# Patient Record
Sex: Female | Born: 1945
Health system: Southern US, Community
[De-identification: ages and names within clinical notes are randomized; demographics above are authoritative.]

## PROBLEM LIST (undated history)

## (undated) DIAGNOSIS — E1142 Type 2 diabetes mellitus with diabetic polyneuropathy: Secondary | ICD-10-CM

## (undated) DIAGNOSIS — R5382 Chronic fatigue, unspecified: Secondary | ICD-10-CM

## (undated) DIAGNOSIS — K769 Liver disease, unspecified: Secondary | ICD-10-CM

## (undated) DIAGNOSIS — E119 Type 2 diabetes mellitus without complications: Secondary | ICD-10-CM

## (undated) DIAGNOSIS — F0390 Unspecified dementia without behavioral disturbance: Secondary | ICD-10-CM

## (undated) DIAGNOSIS — F329 Major depressive disorder, single episode, unspecified: Secondary | ICD-10-CM

## (undated) DIAGNOSIS — F32A Depression, unspecified: Secondary | ICD-10-CM

## (undated) DIAGNOSIS — F419 Anxiety disorder, unspecified: Secondary | ICD-10-CM

## (undated) DIAGNOSIS — M1990 Unspecified osteoarthritis, unspecified site: Secondary | ICD-10-CM

## (undated) DIAGNOSIS — B019 Varicella without complication: Secondary | ICD-10-CM

## (undated) DIAGNOSIS — L0390 Cellulitis, unspecified: Secondary | ICD-10-CM

## (undated) DIAGNOSIS — E538 Deficiency of other specified B group vitamins: Secondary | ICD-10-CM

## (undated) DIAGNOSIS — R079 Chest pain, unspecified: Secondary | ICD-10-CM

## (undated) DIAGNOSIS — K219 Gastro-esophageal reflux disease without esophagitis: Secondary | ICD-10-CM

## (undated) DIAGNOSIS — I10 Essential (primary) hypertension: Secondary | ICD-10-CM

## (undated) DIAGNOSIS — K635 Polyp of colon: Secondary | ICD-10-CM

## (undated) DIAGNOSIS — E7800 Pure hypercholesterolemia, unspecified: Secondary | ICD-10-CM

## (undated) HISTORY — DX: Major depressive disorder, single episode, unspecified: F32.9

## (undated) HISTORY — DX: Varicella without complication: B01.9

## (undated) HISTORY — DX: Essential (primary) hypertension: I10

## (undated) HISTORY — PX: KNEE SURGERY: SHX244

## (undated) HISTORY — PX: HERNIA REPAIR: SHX51

## (undated) HISTORY — PX: ROTATOR CUFF REPAIR: SHX139

## (undated) HISTORY — DX: Anxiety disorder, unspecified: F41.9

## (undated) HISTORY — DX: Chest pain, unspecified: R07.9

## (undated) HISTORY — DX: Depression, unspecified: F32.A

## (undated) HISTORY — PX: TONSILLECTOMY: SUR1361

## (undated) HISTORY — DX: Deficiency of other specified B group vitamins: E53.8

## (undated) HISTORY — DX: Cellulitis, unspecified: L03.90

## (undated) HISTORY — PX: APPENDECTOMY: SHX54

---

## 2004-06-28 ENCOUNTER — Ambulatory Visit (HOSPITAL_COMMUNITY): Admission: RE | Admit: 2004-06-28 | Discharge: 2004-06-28 | Payer: Self-pay | Admitting: Orthopedic Surgery

## 2004-06-28 ENCOUNTER — Ambulatory Visit (HOSPITAL_BASED_OUTPATIENT_CLINIC_OR_DEPARTMENT_OTHER): Admission: RE | Admit: 2004-06-28 | Discharge: 2004-06-28 | Payer: Self-pay | Admitting: Orthopedic Surgery

## 2005-10-18 ENCOUNTER — Emergency Department (HOSPITAL_COMMUNITY): Admission: EM | Admit: 2005-10-18 | Discharge: 2005-10-18 | Payer: Self-pay | Admitting: *Deleted

## 2007-01-15 ENCOUNTER — Encounter: Admission: RE | Admit: 2007-01-15 | Discharge: 2007-01-15 | Payer: Self-pay | Admitting: Family Medicine

## 2009-09-28 ENCOUNTER — Emergency Department (HOSPITAL_BASED_OUTPATIENT_CLINIC_OR_DEPARTMENT_OTHER): Admission: EM | Admit: 2009-09-28 | Discharge: 2009-09-28 | Payer: Self-pay | Admitting: Emergency Medicine

## 2010-06-23 ENCOUNTER — Emergency Department (HOSPITAL_BASED_OUTPATIENT_CLINIC_OR_DEPARTMENT_OTHER)
Admission: EM | Admit: 2010-06-23 | Discharge: 2010-06-23 | Payer: Self-pay | Source: Home / Self Care | Admitting: Emergency Medicine

## 2010-07-01 LAB — URINALYSIS, ROUTINE W REFLEX MICROSCOPIC
Bilirubin Urine: NEGATIVE
Hgb urine dipstick: NEGATIVE
Ketones, ur: NEGATIVE mg/dL
Nitrite: NEGATIVE
Protein, ur: 100 mg/dL — AB
Specific Gravity, Urine: 1.014 (ref 1.005–1.030)
Urine Glucose, Fasting: NEGATIVE mg/dL
Urobilinogen, UA: 0.2 mg/dL (ref 0.0–1.0)
pH: 6 (ref 5.0–8.0)

## 2010-07-01 LAB — URINE MICROSCOPIC-ADD ON

## 2010-11-01 NOTE — Op Note (Signed)
Meredith Phillips, Meredith Phillips               ACCOUNT NO.:  [id number]   MEDICAL RECORD NO.:  [id number]          PATIENT TYPE:  AMB   LOCATION:  DSC                          FACILITY:  [facility]   PHYSICIAN:  Harvie Junior, M.D.   DATE OF BIRTH:  1946-03-03   DATE OF PROCEDURE:  06/28/2004  DATE OF DISCHARGE:                                 OPERATIVE REPORT   PREOPERATIVE DIAGNOSIS:  Status post rotator cuff repair with suspected re-  tear with chronic pain and stiffness.   POSTOPERATIVE DIAGNOSIS:  1.  Frozen shoulder.  2.  Subacromial impingement and significant scar tissue in the subacromial      space.   OPERATION PERFORMED:  1.  Closed manipulation of shoulder.  2.  Subacromial decompression.  3.  Significant debridement within the glenohumeral joint and subacromial      space.   SURGEON:  Harvie Junior, M.D.   ASSISTANT:  Marshia Ly, P.A.   ANESTHESIA:  General.   INDICATIONS FOR PROCEDURE:  Meredith Phillips is a 65 year old female with a long  history of having had an arthroscopic decompression, mini open rotator cuff  repair.  She ultimately was evaluated and felt to have significant pain.  She had done well, off and on and developed some stiffness and chronic pain  and because of this it was felt that repeat evaluation with MRI was  appropriate.  MRI showed she had a 2 to 3 mm hole in the rotator cuff but  that most of the dye was contained and it was felt that if nothing else, we  needed to do an evaluation and possible subacromial decompression to  evaluate the situation. At this point she was brought to the operating room  for this procedure.   DESCRIPTION OF PROCEDURE:  The patient was brought to the operating room and  after adequate anesthesia was obtained with general anesthetic, the patient  was placed supine on the operating table and the shoulder was manipulated  into the overhead position, external rotation and internal rotation  undertaken and there was some  tearing of capsular structures that was  encountered during this manipulation.  At this point routine arthroscopic  examination of the glenohumeral joint revealed there were some adhesions  between the biceps tendon and the rotator cuff which were taken down.  There  was some significant labral pathology which was debrided.  The glenohumeral  joint itself looked pretty good.  Rotator cuff looked quite good and there  was no evidence of full thickness tearing.  Although there was some  thinning, these sutures appeared to be within the substance of the rotator  cuff at this point.  At this point attention was turned out of the  glenohumeral joint into the subacromial space. A dramatic bursectomy was  undertaken as well as taking down the periosteum on the acromioplasty.  A  small acromioplasty was then added to the previously performed acromioplasty  and this was undertaken.  At this point the subtotal bursectomy was  undertaken.  The knots from the previous rotator cuff repair were identified  and debrided and  the arm was put through a range of motion and the distal  clavicle was elevated somewhat by way of a coplaning technique but after  this thorough debridement, the rotator cuff was evaluated and felt to have  no full thickness tearing.  I did not feel that open rotator cuff repair  needed to be undertaken.  At this point the shoulder was copiously irrigated  and suctioned dry.  Arthroscopic portal was closed with a bandage.  Sterile  compressive dressing was applied.  The patient was taken to the recovery  room where she was noted to be in satisfactory condition.  The estimated  blood loss for this procedure was none.      JLG/MEDQ  D:  06/28/2004  T:  06/28/2004  Job:  [id number]

## 2013-06-18 ENCOUNTER — Emergency Department (HOSPITAL_BASED_OUTPATIENT_CLINIC_OR_DEPARTMENT_OTHER): Payer: Medicare Other

## 2013-06-18 ENCOUNTER — Encounter (HOSPITAL_BASED_OUTPATIENT_CLINIC_OR_DEPARTMENT_OTHER): Payer: Self-pay | Admitting: Emergency Medicine

## 2013-06-18 ENCOUNTER — Emergency Department (HOSPITAL_BASED_OUTPATIENT_CLINIC_OR_DEPARTMENT_OTHER)
Admission: EM | Admit: 2013-06-18 | Discharge: 2013-06-18 | Disposition: A | Discharge status: Home / Self Care | Payer: Medicare Other | Attending: Emergency Medicine | Admitting: Emergency Medicine

## 2013-06-18 DIAGNOSIS — Z8719 Personal history of other diseases of the digestive system: Secondary | ICD-10-CM | POA: Insufficient documentation

## 2013-06-18 DIAGNOSIS — Z8739 Personal history of other diseases of the musculoskeletal system and connective tissue: Secondary | ICD-10-CM | POA: Insufficient documentation

## 2013-06-18 DIAGNOSIS — I10 Essential (primary) hypertension: Secondary | ICD-10-CM | POA: Insufficient documentation

## 2013-06-18 DIAGNOSIS — E1142 Type 2 diabetes mellitus with diabetic polyneuropathy: Secondary | ICD-10-CM | POA: Insufficient documentation

## 2013-06-18 DIAGNOSIS — Z862 Personal history of diseases of the blood and blood-forming organs and certain disorders involving the immune mechanism: Secondary | ICD-10-CM | POA: Insufficient documentation

## 2013-06-18 DIAGNOSIS — R1084 Generalized abdominal pain: Secondary | ICD-10-CM | POA: Insufficient documentation

## 2013-06-18 DIAGNOSIS — R197 Diarrhea, unspecified: Secondary | ICD-10-CM | POA: Insufficient documentation

## 2013-06-18 DIAGNOSIS — Z8639 Personal history of other endocrine, nutritional and metabolic disease: Secondary | ICD-10-CM | POA: Insufficient documentation

## 2013-06-18 DIAGNOSIS — E1149 Type 2 diabetes mellitus with other diabetic neurological complication: Secondary | ICD-10-CM | POA: Insufficient documentation

## 2013-06-18 DIAGNOSIS — R109 Unspecified abdominal pain: Secondary | ICD-10-CM

## 2013-06-18 DIAGNOSIS — R6883 Chills (without fever): Secondary | ICD-10-CM | POA: Insufficient documentation

## 2013-06-18 DIAGNOSIS — R112 Nausea with vomiting, unspecified: Secondary | ICD-10-CM | POA: Insufficient documentation

## 2013-06-18 DIAGNOSIS — R1110 Vomiting, unspecified: Secondary | ICD-10-CM

## 2013-06-18 DIAGNOSIS — Z8659 Personal history of other mental and behavioral disorders: Secondary | ICD-10-CM | POA: Insufficient documentation

## 2013-06-18 HISTORY — DX: Unspecified osteoarthritis, unspecified site: M19.90

## 2013-06-18 HISTORY — DX: Essential (primary) hypertension: I10

## 2013-06-18 HISTORY — DX: Type 2 diabetes mellitus without complications: E11.9

## 2013-06-18 HISTORY — DX: Pure hypercholesterolemia, unspecified: E78.00

## 2013-06-18 HISTORY — DX: Type 2 diabetes mellitus with diabetic polyneuropathy: E11.42

## 2013-06-18 HISTORY — DX: Gastro-esophageal reflux disease without esophagitis: K21.9

## 2013-06-18 LAB — OCCULT BLOOD X 1 CARD TO LAB, STOOL: Fecal Occult Bld: NEGATIVE

## 2013-06-18 LAB — COMPREHENSIVE METABOLIC PANEL
ALT: 16 U/L (ref 0–35)
AST: 19 U/L (ref 0–37)
Albumin: 3.7 g/dL (ref 3.5–5.2)
Alkaline Phosphatase: 55 U/L (ref 39–117)
BUN: 11 mg/dL (ref 6–23)
CO2: 25 mEq/L (ref 19–32)
Calcium: 10.3 mg/dL (ref 8.4–10.5)
Chloride: 96 mEq/L (ref 96–112)
Creatinine, Ser: 1.1 mg/dL (ref 0.50–1.10)
GFR calc Af Amer: 58 mL/min — ABNORMAL LOW (ref >90)
GFR calc non Af Amer: 50 mL/min — ABNORMAL LOW (ref >90)
Glucose, Bld: 130 mg/dL — ABNORMAL HIGH (ref 70–99)
Potassium: 3.4 mEq/L — ABNORMAL LOW (ref 3.7–5.3)
Sodium: 136 mEq/L — ABNORMAL LOW (ref 137–147)
Total Bilirubin: 0.4 mg/dL (ref 0.3–1.2)
Total Protein: 7.4 g/dL (ref 6.0–8.3)

## 2013-06-18 LAB — CBC WITH DIFFERENTIAL/PLATELET
Basophils Absolute: 0 10*3/uL (ref 0.0–0.1)
Basophils Relative: 0 % (ref 0–1)
Eosinophils Absolute: 0 10*3/uL (ref 0.0–0.7)
Eosinophils Relative: 1 % (ref 0–5)
HCT: 36.1 % (ref 36.0–46.0)
Hemoglobin: 12.1 g/dL (ref 12.0–15.0)
Lymphocytes Relative: 48 % — ABNORMAL HIGH (ref 12–46)
Lymphs Abs: 3.6 10*3/uL (ref 0.7–4.0)
MCH: 28.3 pg (ref 26.0–34.0)
MCHC: 33.5 g/dL (ref 30.0–36.0)
MCV: 84.5 fL (ref 78.0–100.0)
Monocytes Absolute: 0.7 10*3/uL (ref 0.1–1.0)
Monocytes Relative: 9 % (ref 3–12)
Neutro Abs: 3.2 10*3/uL (ref 1.7–7.7)
Neutrophils Relative %: 42 % — ABNORMAL LOW (ref 43–77)
Platelets: 356 10*3/uL (ref 150–400)
RBC: 4.27 MIL/uL (ref 3.87–5.11)
RDW: 12.3 % (ref 11.5–15.5)
WBC: 7.5 10*3/uL (ref 4.0–10.5)

## 2013-06-18 LAB — CG4 I-STAT (LACTIC ACID): Lactic Acid, Venous: 1.01 mmol/L (ref 0.5–2.2)

## 2013-06-18 LAB — URINALYSIS, ROUTINE W REFLEX MICROSCOPIC
Bilirubin Urine: NEGATIVE
Glucose, UA: NEGATIVE mg/dL
Hgb urine dipstick: NEGATIVE
Ketones, ur: NEGATIVE mg/dL
Nitrite: NEGATIVE
Protein, ur: 30 mg/dL — AB
Specific Gravity, Urine: 1.029 (ref 1.005–1.030)
Urobilinogen, UA: 0.2 mg/dL (ref 0.0–1.0)
pH: 6 (ref 5.0–8.0)

## 2013-06-18 LAB — URINE MICROSCOPIC-ADD ON

## 2013-06-18 LAB — TROPONIN I: Troponin I: <0.3 ng/mL (ref <0.30)

## 2013-06-18 LAB — LIPASE, BLOOD: Lipase: 18 U/L (ref 11–59)

## 2013-06-18 MED ORDER — ONDANSETRON 4 MG PO TBDP: ORAL_TABLET | ORAL | Status: DC

## 2013-06-18 MED ORDER — HYDROCODONE-ACETAMINOPHEN 5-325 MG PO TABS
1.0000 | ORAL_TABLET | Freq: Once | ORAL | Status: AC
  Administered 2013-06-18: 1 via ORAL
  Filled 2013-06-18: qty 1

## 2013-06-18 MED ORDER — FENTANYL CITRATE 0.05 MG/ML IJ SOLN
50.0000 ug | Freq: Once | INTRAMUSCULAR | Status: AC
  Administered 2013-06-18: 50 ug via INTRAVENOUS
  Filled 2013-06-18: qty 2

## 2013-06-18 MED ORDER — SODIUM CHLORIDE 0.9 % IV BOLUS (SEPSIS)
500.0000 mL | Freq: Once | INTRAVENOUS | Status: AC
  Administered 2013-06-18: 500 mL via INTRAVENOUS

## 2013-06-18 MED ORDER — IOHEXOL 300 MG/ML  SOLN
50.0000 mL | Freq: Once | INTRAMUSCULAR | Status: AC | PRN
Start: 2013-06-18 — End: 2013-06-18
  Administered 2013-06-18: 50 mL via ORAL

## 2013-06-18 MED ORDER — ONDANSETRON HCL 4 MG/2ML IJ SOLN
4.0000 mg | Freq: Once | INTRAMUSCULAR | Status: AC
  Administered 2013-06-18: 4 mg via INTRAVENOUS
  Filled 2013-06-18: qty 2

## 2013-06-18 MED ORDER — POTASSIUM CHLORIDE CRYS ER 20 MEQ PO TBCR
40.0000 meq | EXTENDED_RELEASE_TABLET | Freq: Once | ORAL | Status: AC
  Administered 2013-06-18: 40 meq via ORAL
  Filled 2013-06-18: qty 2

## 2013-06-18 MED ORDER — HYDROCODONE-ACETAMINOPHEN 5-325 MG PO TABS: 1.0000 | ORAL_TABLET | Freq: Four times a day (QID) | ORAL | Status: DC | PRN

## 2013-06-18 MED ORDER — IOHEXOL 300 MG/ML  SOLN
100.0000 mL | Freq: Once | INTRAMUSCULAR | Status: AC | PRN
  Administered 2013-06-18: 100 mL via INTRAVENOUS

## 2013-06-18 MED ORDER — ONDANSETRON 4 MG PO TBDP
4.0000 mg | ORAL_TABLET | Freq: Once | ORAL | Status: AC
  Administered 2013-06-18: 4 mg via ORAL
  Filled 2013-06-18: qty 1

## 2013-06-18 NOTE — ED Notes (Signed)
Pt unable to provide urine sample, Pt will try later

## 2013-06-18 NOTE — Discharge Instructions (Signed)
Take zofran for nausea. For severe pain take norco or vicodin however realize they have the potential for addiction and it can make you sleepy and has tylenol in it.  No operating machinery while taking. If you were given medicines take as directed.  If you are on coumadin or contraceptives realize their levels and effectiveness is altered by many different medicines.  If you have any reaction (rash, tongues swelling, other) to the medicines stop taking and see a physician.   Please follow up as directed and return to the ER or see a physician for new or worsening symptoms.  Thank you. Blood sugar today was 130which is slightly high. Blood potassium was 3.4, slightly low. Call the wellness Center to schedule appointment next week

## 2013-06-18 NOTE — ED Provider Notes (Signed)
6:55 PM patient resting comfortably. States she's been having similar pain for several years. Not affected by being or lying supine. States has mild pain at periumbilical area. On exam abdomen normal bowel sounds tender immediately superior to the umbilicus. No mass.  is in no distress. Norco Zofran ordered prior to discharge. Patient is discharged in stable condition. Diagnosis #1 Results for orders placed during the hospital encounter of 06/18/13  URINALYSIS, ROUTINE W REFLEX MICROSCOPIC      Result Value Range   Color, Urine YELLOW  YELLOW   APPearance CLEAR  CLEAR   Specific Gravity, Urine 1.029  1.005 - 1.030   pH 6.0  5.0 - 8.0   Glucose, UA NEGATIVE  NEGATIVE mg/dL   Hgb urine dipstick NEGATIVE  NEGATIVE   Bilirubin Urine NEGATIVE  NEGATIVE   Ketones, ur NEGATIVE  NEGATIVE mg/dL   Protein, ur 30 (*) NEGATIVE mg/dL   Urobilinogen, UA 0.2  0.0 - 1.0 mg/dL   Nitrite NEGATIVE  NEGATIVE   Leukocytes, UA SMALL (*) NEGATIVE  CBC WITH DIFFERENTIAL      Result Value Range   WBC 7.5  4.0 - 10.5 K/uL   RBC 4.27  3.87 - 5.11 MIL/uL   Hemoglobin 12.1  12.0 - 15.0 g/dL   HCT 36.6  44.0 - 34.7 %   MCV 84.5  78.0 - 100.0 fL   MCH 28.3  26.0 - 34.0 pg   MCHC 33.5  30.0 - 36.0 g/dL   RDW 42.5  95.6 - 38.7 %   Platelets 356  150 - 400 K/uL   Neutrophils Relative % 42 (*) 43 - 77 %   Neutro Abs 3.2  1.7 - 7.7 K/uL   Lymphocytes Relative 48 (*) 12 - 46 %   Lymphs Abs 3.6  0.7 - 4.0 K/uL   Monocytes Relative 9  3 - 12 %   Monocytes Absolute 0.7  0.1 - 1.0 K/uL   Eosinophils Relative 1  0 - 5 %   Eosinophils Absolute 0.0  0.0 - 0.7 K/uL   Basophils Relative 0  0 - 1 %   Basophils Absolute 0.0  0.0 - 0.1 K/uL  COMPREHENSIVE METABOLIC PANEL      Result Value Range   Sodium 136 (*) 137 - 147 mEq/L   Potassium 3.4 (*) 3.7 - 5.3 mEq/L   Chloride 96  96 - 112 mEq/L   CO2 25  19 - 32 mEq/L   Glucose, Bld 130 (*) 70 - 99 mg/dL   BUN 11  6 - 23 mg/dL   Creatinine, Ser 5.64  0.50 - 1.10 mg/dL   Calcium 33.2  8.4 - 95.1 mg/dL   Total Protein 7.4  6.0 - 8.3 g/dL   Albumin 3.7  3.5 - 5.2 g/dL   AST 19  0 - 37 U/L   ALT 16  0 - 35 U/L   Alkaline Phosphatase 55  39 - 117 U/L   Total Bilirubin 0.4  0.3 - 1.2 mg/dL   GFR calc non Af Amer 50 (*) >90 mL/min   GFR calc Af Amer 58 (*) >90 mL/min  LIPASE, BLOOD      Result Value Range   Lipase 18  11 - 59 U/L  OCCULT BLOOD X 1 CARD TO LAB, STOOL      Result Value Range   Fecal Occult Bld NEGATIVE  NEGATIVE  TROPONIN I      Result Value Range   Troponin I <0.30  <0.30 ng/mL  URINE MICROSCOPIC-ADD ON      Result Value Range   Squamous Epithelial / LPF FEW (*) RARE   WBC, UA 0-2  <3 WBC/hpf   Bacteria, UA FEW (*) RARE   Casts HYALINE CASTS (*) NEGATIVE  CG4 I-STAT (LACTIC ACID)      Result Value Range   Lactic Acid, Venous 1.01  0.5 - 2.2 mmol/L   Ct Abdomen Pelvis W Contrast  06/18/2013   CLINICAL DATA:  Nausea, vomiting, chills and weakness. Periumbilical pain for 1 year. History of hernia repair, appendectomy and hysterectomy.  EXAM: CT ABDOMEN AND PELVIS WITH CONTRAST  TECHNIQUE: Multidetector CT imaging of the abdomen and pelvis was performed using the standard protocol following bolus administration of intravenous contrast.  CONTRAST:  23mL OMNIPAQUE IOHEXOL 300 MG/ML SOLN, OMNIPAQUE IOHEXOL 300 MG/ML SOLN  COMPARISON:  Abdominal pelvic CT 12/22/2001.  FINDINGS: The lung bases are clear. There is no pleural or pericardial effusion. There is a recurrent moderate size hiatal hernia associated with mild wall thickening.  No significant abnormalities of the liver, spleen, gallbladder or pancreas are demonstrated. There is no adrenal mass. Small low density renal lesions bilaterally are likely cysts. There is no hydronephrosis.  No focal abnormalities of the stomach, small bowel or colon are demonstrated. There is stool throughout the colon. There is no surrounding inflammatory change. There is diffuse aortoiliac atherosclerosis.  Multiple pelvic phleboliths are present bilaterally. The bladder appears unremarkable. There is no adnexal mass status post hysterectomy.  There is lower lumbar spondylosis with disc bulging and facet disease contributing to potential spinal stenosis at several levels. No acute osseous findings are evident.  IMPRESSION: 1. No acute abdominal pelvic findings. 2. Moderate-size recurrent hiatal hernia. 3. Diffuse atherosclerosis with multiple pelvic phleboliths. 4. Multilevel lumbar spondylosis.   Electronically Signed   By: Roxy Horseman M.D.   On: 06/18/2013 17:08   Dx #1 chronic abdominal pain #2 hypokalemia #3hyperglycemia  Doug Sou, MD 06/18/13 1859

## 2013-06-18 NOTE — ED Notes (Signed)
Patient was unable to sign discharge d/t equipment malfunction at the time of discharge.  No needs were voiced/no questions verbalized at the time of departure.  Discharged home, husband driving.

## 2013-06-18 NOTE — ED Provider Notes (Signed)
CSN: [id number]     Arrival date & time 06/18/13  1233 History   First MD Initiated Contact with Patient 06/18/13 1433     Chief Complaint  Patient presents with  . Abdominal Pain   (Consider location/radiation/quality/duration/timing/severity/associated sxs/prior Treatment) HPI Comments: 68 yo female with generalized intermittent cramping abd pain worsening for one year, especially the past week.  Worse with eating, pt had scopes done years ago, nothing recent. Ulcer hx but she has not taken nsaids since.  Non radiating pain.  No bleeding, mild dark stools.    Patient is a 68 y.o. female presenting with abdominal pain. The history is provided by the patient.  Abdominal Pain Pain location:  Generalized Associated symptoms: chills, diarrhea, nausea and vomiting   Associated symptoms: no chest pain, no dysuria, no fever and no shortness of breath     Past Medical History  Diagnosis Date  . Diabetes mellitus without complication   . GERD (gastroesophageal reflux disease)   . Hypercholesteremia   . Hypertension   . Diabetic peripheral neuropathy   . Arthritis    Past Surgical History  Procedure Laterality Date  . Hernia repair    . Rotator cuff repair    . Knee surgery     No family history on file. History  Substance Use Topics  . Smoking status: Never Smoker   . Smokeless tobacco: Not on file  . Alcohol Use: No   OB History   Grav Para Term Preterm Abortions TAB SAB Ect Mult Living                 Review of Systems  Constitutional: Positive for chills and appetite change. Negative for fever.  HENT: Negative for congestion.   Eyes: Negative for visual disturbance.  Respiratory: Negative for shortness of breath.   Cardiovascular: Negative for chest pain.  Gastrointestinal: Positive for nausea, vomiting, abdominal pain and diarrhea.  Genitourinary: Negative for dysuria and flank pain.  Musculoskeletal: Negative for back pain, neck pain and neck stiffness.  Skin:  Negative for rash.  Neurological: Negative for light-headedness and headaches.    Allergies  Other and Shellfish allergy  Home Medications  No current outpatient prescriptions on file. Pulse 56  Temp(Src) 98.2 F (36.8 C) (Oral)  Resp 16  Ht 5\' 3"  (1.6 m)  Wt 160 lb (72.576 kg)  BMI 28.35 kg/m2  SpO2 100% Physical Exam  Nursing note and vitals reviewed. Constitutional: She is oriented to person, place, and time. She appears well-developed and well-nourished.  HENT:  Head: Normocephalic and atraumatic.  Mild dry mm  Eyes: Conjunctivae are normal. Right eye exhibits no discharge. Left eye exhibits no discharge.  Neck: Normal range of motion. Neck supple. No tracheal deviation present.  Cardiovascular: Normal rate and regular rhythm.   Pulmonary/Chest: Effort normal and breath sounds normal.  Abdominal: Soft. She exhibits no distension. There is tenderness (mild central and diffuse). There is no guarding.  Musculoskeletal: She exhibits no edema.  Neurological: She is alert and oriented to person, place, and time.  Skin: Skin is warm. No rash noted.  Psychiatric: She has a normal mood and affect.    ED Course  Procedures (including critical care time) Labs Review Labs Reviewed  CBC WITH DIFFERENTIAL - Abnormal; Notable for the following:    Neutrophils Relative % 42 (*)    Lymphocytes Relative 48 (*)    All other components within normal limits  COMPREHENSIVE METABOLIC PANEL - Abnormal; Notable for the following:  Sodium 136 (*)    Potassium 3.4 (*)    Glucose, Bld 130 (*)    GFR calc non Af Amer 50 (*)    GFR calc Af Amer 58 (*)    All other components within normal limits  LIPASE, BLOOD  OCCULT BLOOD X 1 CARD TO LAB, STOOL  TROPONIN I  URINALYSIS, ROUTINE W REFLEX MICROSCOPIC  LACTIC ACID, PLASMA  CG4 I-STAT (LACTIC ACID)   Imaging Review Ct Abdomen Pelvis W Contrast  06/18/2013   CLINICAL DATA:  Nausea, vomiting, chills and weakness. Periumbilical pain for 1  year. History of hernia repair, appendectomy and hysterectomy.  EXAM: CT ABDOMEN AND PELVIS WITH CONTRAST  TECHNIQUE: Multidetector CT imaging of the abdomen and pelvis was performed using the standard protocol following bolus administration of intravenous contrast.  CONTRAST:  48mL OMNIPAQUE IOHEXOL 300 MG/ML SOLN, OMNIPAQUE IOHEXOL 300 MG/ML SOLN  COMPARISON:  Abdominal pelvic CT 12/22/2001.  FINDINGS: The lung bases are clear. There is no pleural or pericardial effusion. There is a recurrent moderate size hiatal hernia associated with mild wall thickening.  No significant abnormalities of the liver, spleen, gallbladder or pancreas are demonstrated. There is no adrenal mass. Small low density renal lesions bilaterally are likely cysts. There is no hydronephrosis.  No focal abnormalities of the stomach, small bowel or colon are demonstrated. There is stool throughout the colon. There is no surrounding inflammatory change. There is diffuse aortoiliac atherosclerosis. Multiple pelvic phleboliths are present bilaterally. The bladder appears unremarkable. There is no adnexal mass status post hysterectomy.  There is lower lumbar spondylosis with disc bulging and facet disease contributing to potential spinal stenosis at several levels. No acute osseous findings are evident.  IMPRESSION: 1. No acute abdominal pelvic findings. 2. Moderate-size recurrent hiatal hernia. 3. Diffuse atherosclerosis with multiple pelvic phleboliths. 4. Multilevel lumbar spondylosis.   Electronically Signed   By: Roxy Horseman M.D.   On: 06/18/2013 17:08    EKG Interpretation   None       MDM   1. Abdominal pain   2. Vomiting    Intermittent abdo pain for 1 yrs.  Well appearing in ED.  Pain improved. Vomited once after po contrast but feels improved. UA pending. CT no acute findings to explain pain except hernia, lactate nl.  Close fup outpt discussed, pt may need GI/ scopes.   Signed out pending UA, I have already  discussed fup with pt.   Results and differential diagnosis were discussed with the patient. Close follow up outpatient was discussed, patient comfortable with the plan.   Diagnosis: abd pain, nausea    Enid Skeens, MD 06/18/13 [phone]

## 2013-06-18 NOTE — ED Notes (Signed)
Pt having abdominal pain for over one year.  Pt saw MD on 12-27 for same and was put on two antibiotics.  Pt states the medication made her feel worse.  Pt having some N/V.  Some chills.  Pt states she feels weak all over.  Pt states she has been having dark stools for a long time as well.

## 2013-06-22 ENCOUNTER — Ambulatory Visit: Payer: Self-pay | Admitting: Family

## 2013-07-13 ENCOUNTER — Ambulatory Visit: Payer: Medicare Other | Admitting: Gastroenterology

## 2013-09-13 ENCOUNTER — Ambulatory Visit: Payer: Self-pay | Admitting: Family Medicine

## 2013-09-27 ENCOUNTER — Ambulatory Visit: Payer: Medicare Other | Admitting: Family Medicine

## 2013-09-27 DIAGNOSIS — Z0289 Encounter for other administrative examinations: Secondary | ICD-10-CM

## 2013-10-03 ENCOUNTER — Ambulatory Visit: Payer: Medicare Other | Admitting: Family Medicine

## 2013-11-29 DIAGNOSIS — R197 Diarrhea, unspecified: Secondary | ICD-10-CM | POA: Insufficient documentation

## 2013-12-10 ENCOUNTER — Emergency Department (HOSPITAL_BASED_OUTPATIENT_CLINIC_OR_DEPARTMENT_OTHER)
Admission: EM | Admit: 2013-12-10 | Discharge: 2013-12-10 | Disposition: A | Discharge status: Home / Self Care | Payer: Medicare HMO | Attending: Emergency Medicine | Admitting: Emergency Medicine

## 2013-12-10 ENCOUNTER — Encounter (HOSPITAL_BASED_OUTPATIENT_CLINIC_OR_DEPARTMENT_OTHER): Payer: Self-pay | Admitting: Emergency Medicine

## 2013-12-10 DIAGNOSIS — M5380 Other specified dorsopathies, site unspecified: Secondary | ICD-10-CM | POA: Insufficient documentation

## 2013-12-10 DIAGNOSIS — E1149 Type 2 diabetes mellitus with other diabetic neurological complication: Secondary | ICD-10-CM | POA: Insufficient documentation

## 2013-12-10 DIAGNOSIS — Z79899 Other long term (current) drug therapy: Secondary | ICD-10-CM | POA: Insufficient documentation

## 2013-12-10 DIAGNOSIS — Z792 Long term (current) use of antibiotics: Secondary | ICD-10-CM | POA: Insufficient documentation

## 2013-12-10 DIAGNOSIS — E1142 Type 2 diabetes mellitus with diabetic polyneuropathy: Secondary | ICD-10-CM | POA: Insufficient documentation

## 2013-12-10 DIAGNOSIS — Z8739 Personal history of other diseases of the musculoskeletal system and connective tissue: Secondary | ICD-10-CM | POA: Insufficient documentation

## 2013-12-10 DIAGNOSIS — Z860100 Personal history of colon polyps, unspecified: Secondary | ICD-10-CM | POA: Insufficient documentation

## 2013-12-10 DIAGNOSIS — Z862 Personal history of diseases of the blood and blood-forming organs and certain disorders involving the immune mechanism: Secondary | ICD-10-CM | POA: Insufficient documentation

## 2013-12-10 DIAGNOSIS — N390 Urinary tract infection, site not specified: Secondary | ICD-10-CM | POA: Insufficient documentation

## 2013-12-10 DIAGNOSIS — M62830 Muscle spasm of back: Secondary | ICD-10-CM

## 2013-12-10 DIAGNOSIS — I10 Essential (primary) hypertension: Secondary | ICD-10-CM | POA: Insufficient documentation

## 2013-12-10 DIAGNOSIS — Z8639 Personal history of other endocrine, nutritional and metabolic disease: Secondary | ICD-10-CM | POA: Insufficient documentation

## 2013-12-10 HISTORY — DX: Liver disease, unspecified: K76.9

## 2013-12-10 HISTORY — DX: Polyp of colon: K63.5

## 2013-12-10 LAB — URINALYSIS, ROUTINE W REFLEX MICROSCOPIC
Bilirubin Urine: NEGATIVE
Glucose, UA: NEGATIVE mg/dL
Hgb urine dipstick: NEGATIVE
Ketones, ur: NEGATIVE mg/dL
Nitrite: NEGATIVE
Protein, ur: 100 mg/dL — AB
Specific Gravity, Urine: 1.009 (ref 1.005–1.030)
Urobilinogen, UA: 0.2 mg/dL (ref 0.0–1.0)
pH: 7 (ref 5.0–8.0)

## 2013-12-10 LAB — URINE MICROSCOPIC-ADD ON

## 2013-12-10 MED ORDER — CEPHALEXIN 500 MG PO CAPS: 500.0000 mg | ORAL_CAPSULE | Freq: Four times a day (QID) | ORAL | Status: DC

## 2013-12-10 MED ORDER — DIAZEPAM 2 MG PO TABS
2.0000 mg | ORAL_TABLET | Freq: Once | ORAL | Status: AC
  Administered 2013-12-10: 2 mg via ORAL
  Filled 2013-12-10: qty 1

## 2013-12-10 MED ORDER — DIAZEPAM 2 MG PO TABS: 2.0000 mg | ORAL_TABLET | Freq: Three times a day (TID) | ORAL | Status: DC | PRN

## 2013-12-10 NOTE — ED Notes (Signed)
Pt does not know medications. Pt husband will call with updated medications.

## 2013-12-10 NOTE — Discharge Instructions (Signed)

## 2013-12-10 NOTE — ED Notes (Signed)
Pt states right flank pain. Pt states started Thursday and throughout the past 2 days has radiated to her left back and left side. Pt states no problems with urine or bowel movements.

## 2013-12-26 NOTE — ED Provider Notes (Signed)
CSN: [id number]     Arrival date & time 12/10/13  1423 History   First MD Initiated Contact with Patient 12/10/13 1431     Chief Complaint  Patient presents with  . Flank Pain     (Consider location/radiation/quality/duration/timing/severity/associated sxs/prior Treatment) Patient is a 68 y.o. female presenting with flank pain. The history is provided by the patient. No language interpreter was used.  Flank Pain This is a new problem. The current episode started in the past 7 days. Associated symptoms include abdominal pain. Pertinent negatives include no chest pain, coughing, fever, nausea, rash, vomiting or weakness. Associated symptoms comments: Left flank pain that radiates to left lateral abdominal wall. No fever, vomiting, dysuria, hematuria or change in bowel habits. She denies history of kidney stones. No cough or shortness of breath. The pain is worse with movement. No known injury..    Past Medical History  Diagnosis Date  . Diabetes mellitus without complication   . GERD (gastroesophageal reflux disease)   . Hypercholesteremia   . Hypertension   . Diabetic peripheral neuropathy   . Arthritis   . Liver disease   . Polyp of colon    Past Surgical History  Procedure Laterality Date  . Hernia repair    . Rotator cuff repair    . Knee surgery     History reviewed. No pertinent family history. History  Substance Use Topics  . Smoking status: Never Smoker   . Smokeless tobacco: Not on file  . Alcohol Use: No   OB History   Grav Para Term Preterm Abortions TAB SAB Ect Mult Living                 Review of Systems  Constitutional: Negative for fever and appetite change.  Respiratory: Negative for cough and shortness of breath.   Cardiovascular: Negative for chest pain.  Gastrointestinal: Positive for abdominal pain. Negative for nausea, vomiting and diarrhea.  Genitourinary: Positive for flank pain. Negative for dysuria and hematuria.  Musculoskeletal: Negative for  back pain.  Skin: Negative for rash.  Neurological: Negative for weakness.      Allergies  Other and Shellfish allergy  Home Medications   Prior to Admission medications   Medication Sig Start Date End Date Taking? Authorizing Provider  metFORMIN (GLUCOPHAGE) 500 MG tablet Take 500 mg by mouth 2 (two) times daily with a meal.   Yes Historical Provider, MD  cephALEXin (KEFLEX) 500 MG capsule Take 1 capsule (500 mg total) by mouth 4 (four) times daily. 12/10/13   Pamelia Botto A Remington Skalsky, PA-C  diazepam (VALIUM) 2 MG tablet Take 1 tablet (2 mg total) by mouth every 8 (eight) hours as needed for anxiety. 12/10/13   Kinzie Wickes A Lateefah Mallery, PA-C  HYDROcodone-acetaminophen (NORCO) 5-325 MG per tablet Take 1-2 tablets by mouth every 6 (six) hours as needed for severe pain. 06/18/13   Enid Skeens, MD  ondansetron (ZOFRAN ODT) 4 MG disintegrating tablet 4mg  ODT q4 hours prn nausea/vomit 06/18/13   08/16/13, MD   BP 113/64  Pulse 74  Temp(Src) 98.1 F (36.7 C) (Oral)  Resp 16  Ht 5\' 3"  (1.6 m)  Wt 169 lb (76.658 kg)  BMI 29.94 kg/m2  SpO2 99% Physical Exam  Constitutional: She is oriented to person, place, and time. She appears well-developed and well-nourished.  HENT:  Head: Normocephalic.  Neck: Normal range of motion. Neck supple.  Cardiovascular: Normal rate and regular rhythm.   Pulmonary/Chest: Effort normal and breath sounds normal.  Abdominal:  Soft. Bowel sounds are normal. There is tenderness. There is no rebound and no guarding.  Lateral abdominal tenderness. No mass. BS normoactive.  Genitourinary:  No CVA tenderness.   Musculoskeletal: Normal range of motion.  Neurological: She is alert and oriented to person, place, and time.  Skin: Skin is warm and dry. No rash noted.  Psychiatric: She has a normal mood and affect.    ED Course  Procedures (including critical care time) Labs Review Labs Reviewed  URINALYSIS, ROUTINE W REFLEX MICROSCOPIC - Abnormal; Notable for the  following:    Protein, ur 100 (*)    Leukocytes, UA MODERATE (*)    All other components within normal limits  URINE MICROSCOPIC-ADD ON - Abnormal; Notable for the following:    Squamous Epithelial / LPF FEW (*)    Bacteria, UA FEW (*)    All other components within normal limits    Imaging Review No results found.   EKG Interpretation None      MDM   Final diagnoses:  Muscle spasm of back  UTI (lower urinary tract infection)    Urine is borderline positive. Culture pending. Patient evaluated by Dr. Fredderick Phenix. Felt pain is related to muscular pattern. VSS. Return precautions given.     Arnoldo Hooker, PA-C 12/26/13 [phone]

## 2013-12-26 NOTE — ED Provider Notes (Signed)
Medical screening examination/treatment/procedure(s) were performed by non-physician practitioner and as supervising physician I was immediately available for consultation/collaboration.   EKG Interpretation None        Luverta Korte, MD 12/26/13 2021 

## 2014-11-25 DIAGNOSIS — K2100 Gastro-esophageal reflux disease with esophagitis, without bleeding: Secondary | ICD-10-CM | POA: Insufficient documentation

## 2014-11-25 DIAGNOSIS — M169 Osteoarthritis of hip, unspecified: Secondary | ICD-10-CM | POA: Insufficient documentation

## 2014-11-25 DIAGNOSIS — M159 Polyosteoarthritis, unspecified: Secondary | ICD-10-CM | POA: Insufficient documentation

## 2014-12-26 ENCOUNTER — Encounter: Payer: Self-pay | Admitting: Family

## 2014-12-26 ENCOUNTER — Ambulatory Visit (INDEPENDENT_AMBULATORY_CARE_PROVIDER_SITE_OTHER): Payer: Medicare HMO | Admitting: Family

## 2014-12-26 VITALS — BP 130/94 | HR 66 | Temp 97.5°F | Resp 18 | Ht 63.0 in | Wt 164.0 lb

## 2014-12-26 DIAGNOSIS — Z23 Encounter for immunization: Secondary | ICD-10-CM

## 2014-12-26 DIAGNOSIS — Z1231 Encounter for screening mammogram for malignant neoplasm of breast: Secondary | ICD-10-CM

## 2014-12-26 DIAGNOSIS — Z0000 Encounter for general adult medical examination without abnormal findings: Secondary | ICD-10-CM | POA: Diagnosis not present

## 2014-12-26 NOTE — Progress Notes (Signed)
Subjective:    Patient ID: Meredith Phillips, female    DOB: 06-01-1946, 69 y.o.   MRN: [id number]  Chief Complaint  Patient presents with  . Establish Care    would like to set up a bone density test    HPI:  Meredith Phillips is a 69 y.o. female who presents today for an annual wellness visit.   1) Health Maintenance -   Diet - Averages about 2-3 meals per day consisting of occasional fruits and vegetables and does not eat a lot or very well.   Exercise - No structured exercise  2) Preventative Exams / Immunizations:  Dental -- Due for exam  Vision -- Up to date   Health Maintenance  Topic Date Due  . MAMMOGRAM  06/24/1993  . ZOSTAVAX  06/25/2003  . DEXA SCAN  06/24/2008  . PNA vac Low Risk Adult (1 of 2 - PCV13) 06/24/2008  . INFLUENZA VACCINE  01/15/2015  . TETANUS/TDAP  11/14/2019  . COLONOSCOPY  10/15/2022   Zostavax today; check on PNA vaccine    There is no immunization history on file for this patient.   RISK FACTORS  Tobacco History  Smoking status  . Never Smoker   Smokeless tobacco  . Never Used     Cardiac risk factors: advanced age (older than 4 for men, 59 for women), diabetes mellitus, dyslipidemia, hypertension and sedentary lifestyle.  Depression Screen  Q1: Over the past two weeks, have you felt down, depressed or hopeless? No  Q2: Over the past two weeks, have you felt little interest or pleasure in doing things? No  Have you lost interest or pleasure in daily life? No  Do you often feel hopeless? No  Do you cry easily over simple problems? No  Activities of Daily Living In your present state of health, do you have any difficulty performing the following activities?:  Driving? No Managing money?  No Feeding yourself? No Getting from bed to chair? No Climbing a flight of stairs? No Preparing food and eating?: No Bathing or showering? No Getting dressed: No Getting to the toilet? No Using the toilet: No Moving around from  place to place: No In the past year have you fallen or had a near fall?:No   Home Safety Has smoke detector and wears seat belts. No firearms. No excess sun exposure. Are there smokers in your home (other than you)?  No Do you feel safe at home?  Yes  Hearing Difficulties: No Do you often ask people to speak up or repeat themselves? No Do you experience ringing or noises in your ears? No  Do you have difficulty understanding soft or whispered voices? No    Cognitive Testing  Alert? Yes   Normal Appearance? Yes  Oriented to person? Yes  Place? Yes   Time? Yes  Recall of three objects?  Yes  Can perform simple calculations? Yes  Displays appropriate judgment? Yes  Can read the correct time from a watch face? Yes  Do you feel that you have a problem with memory? No  Do you often misplace items? No   Advanced Directives have been discussed with the patient? Does not have.   Current Physicians/Providers and Suppliers  1. Marcos Eke, FNP - Primary Care  Indicate any recent Medical Services you may have received from other than [facility] providers in the past year (date may be approximate).  All answers were reviewed with the patient and necessary referrals were made:  Jeanine Luz,  FNP   12/26/2014    Review of Systems  Constitutional: Denies fever, chills, fatigue, or significant weight gain/loss. HENT: Head: Denies headache or neck pain Ears: Denies changes in hearing, ringing in ears, earache, drainage Nose: Denies discharge, stuffiness, itching, nosebleed, sinus pain Throat: Denies sore throat, hoarseness, dry mouth, sores, thrush Eyes: Denies loss/changes in vision, pain, redness, blurry/double vision, flashing lights Cardiovascular: Denies chest pain/discomfort, tightness, palpitations, shortness of breath with activity, difficulty lying down, swelling, sudden awakening with shortness of breath Respiratory: Denies shortness of breath, cough, sputum production,  wheezing Gastrointestinal: Denies dysphasia, heartburn, change in appetite, nausea, change in bowel habits, rectal bleeding, constipation, diarrhea, yellow skin or eyes Genitourinary: Denies frequency, urgency, burning/pain, blood in urine, incontinence, change in urinary strength. Musculoskeletal: Denies muscle/joint pain, stiffness, back pain, redness or swelling of joints, trauma Skin: Denies rashes, lumps, itching, dryness, color changes, or hair/nail changes Neurological: Denies dizziness, fainting, seizures, weakness, numbness, tingling, tremor Psychiatric - Denies nervousness, stress, depression or memory loss Endocrine: Denies heat or cold intolerance, sweating, frequent urination, excessive thirst, changes in appetite Hematologic: Denies ease of bruising or bleeding    Objective:     BP 130/94 mmHg  Pulse 66  Temp(Src) 97.5 F (36.4 C) (Oral)  Resp 18  Ht 5\' 3"  (1.6 m)  Wt 164 lb (74.39 kg)  BMI 29.06 kg/m2  SpO2 99% Nursing note and vital signs reviewed.  Physical Exam  Constitutional: She is oriented to person, place, and time. She appears well-developed and well-nourished.  Ambulates with a cane. Appears her stated age and is dressed appropriately for the situation.   HENT:  Head: Normocephalic.  Right Ear: Hearing, tympanic membrane, external ear and ear canal normal.  Left Ear: Hearing, tympanic membrane, external ear and ear canal normal.  Nose: Nose normal.  Mouth/Throat: Uvula is midline, oropharynx is clear and moist and mucous membranes are normal.  Eyes: Conjunctivae and EOM are normal. Pupils are equal, round, and reactive to light.  Neck: Neck supple. No JVD present. No tracheal deviation present. No thyromegaly present.  Cardiovascular: Normal rate, regular rhythm, normal heart sounds and intact distal pulses.   Pulmonary/Chest: Effort normal and breath sounds normal.  Abdominal: Soft. Bowel sounds are normal. She exhibits no distension and no mass. There  is no tenderness. There is no rebound and no guarding.  Musculoskeletal: Normal range of motion. She exhibits no edema or tenderness.  Lymphadenopathy:    She has no cervical adenopathy.  Neurological: She is alert and oriented to person, place, and time. She has normal reflexes. No cranial nerve deficit. She exhibits normal muscle tone. Coordination normal.  Skin: Skin is warm and dry.  Psychiatric: She has a normal mood and affect. Her behavior is normal. Judgment and thought content normal.       Assessment & Plan:   During the course of the visit the patient was educated and counseled about appropriate screening and preventive services including:    Pneumococcal vaccine   Influenza vaccine  Td vaccine  Diabetes screening  Diet review for nutrition referral? Yes ____  Not Indicated _X___   Patient Instructions (the written plan) was given to the patient.  Medicare Attestation I have personally reviewed: The patient's medical and social history Their use of alcohol, tobacco or illicit drugs Their current medications and supplements The patient's functional ability including ADLs,fall risks, home safety risks, cognitive, and hearing and visual impairment Diet and physical activities Evidence for depression or mood disorders  The patient's weight,  height, BMI,  have been recorded in the chart.  I have made referrals, counseling, and provided education to the patient based on review of the above and I have provided the patient with a written personalized care plan for preventive services.     Jeanine Luz, FNP   12/26/2014

## 2014-12-26 NOTE — Assessment & Plan Note (Signed)
1) Anticipatory Guidance: Discussed importance of wearing a seatbelt while driving and not texting while driving; changing batteries in smoke detector at least once annually; wearing suntan lotion when outside; eating a balanced and moderate diet; getting physical activity at least 30 minutes per day.  2) Immunizations / Screenings / Labs:  Zostavax updated today. Will check on PNA vaccinations she has received. All other immunizations are up to date per recommendations. Due for dental screen which will be scheduled independently. Also due for mammogram and DEXA - referrals placed for both. Other screenings are up to date per recommendations. Obtain CBC, BMET, A1c, urine microalbumin, Lipid profile and TSH when fasting.   Overall well exam. Several risk factors for cardiovascular disease include diabetes, hypertension, hyperlipidemia, and sedentary lifestyle. Chronic diseases are currently managed with medication and will be monitored for effectiveness of treatment regimen. Discussed importance of physical activity and increasing physical activity to 30 minutes most days of the week which will help reduce her risk for cardiovascular disease and bone mineral density. Follow up office visit pending lab work; follow up prevention exam in 1 year.

## 2014-12-26 NOTE — Assessment & Plan Note (Signed)
Reviewed and updated patient's medical, surgical, family and social history. Medications and allergies were also reviewed. Basic screenings for depression, activities of daily living, hearing, cognition and safety were performed. Provider list was updated and health plan was provided to the patient.  

## 2014-12-26 NOTE — Progress Notes (Deleted)
   Subjective:    Patient ID: Meredith Phillips, female    DOB: [birth year], 69 y.o.   MRN: [id number]  Chief Complaint  Patient presents with  . Establish Care    would like to set up a bone density test    HPI:  Meredith Phillips is a 69 y.o. female with a PMH of *** who presents today ***   Review of Systems    Objective:    BP 130/94 mmHg  Pulse 66  Temp(Src) 97.5 F (36.4 C) (Oral)  Resp 18  Ht 5\' 3"  (1.6 m)  Wt 164 lb (74.39 kg)  BMI 29.06 kg/m2  SpO2 99% Nursing note and vital signs reviewed.  Physical Exam     Assessment & Plan:   Problem List Items Addressed This Visit    None

## 2014-12-26 NOTE — Progress Notes (Signed)
Pre visit review using our clinic review tool, if applicable. No additional management support is needed unless otherwise documented below in the visit note. 

## 2014-12-26 NOTE — Patient Instructions (Addendum)
Thank you for choosing [facility].  Summary/Instructions:  They will contact for mammogram and bone density.  Please stop by the lab on the basement level of the building for your blood work. Your results will be released to [place] (or called to you) after review, usually within 72 hours after test completion. If any changes need to be made, you will be notified at that same time.  Referrals have been made during this visit. You should expect to hear back from our schedulers in about 7-10 days in regards to establishing an appointment with the specialists we discussed.   If your symptoms worsen or fail to improve, please contact our office for further instruction, or in case of emergency go directly to the emergency room at the closest medical facility.   Health Maintenance  Topic Date Due  . MAMMOGRAM  06/24/1993  . ZOSTAVAX  06/25/2003  . DEXA SCAN  06/24/2008  . PNA vac Low Risk Adult (1 of 2 - PCV13) 06/24/2008  . INFLUENZA VACCINE  01/15/2015  . TETANUS/TDAP  11/14/2019  . COLONOSCOPY  10/15/2022    Health Maintenance Adopting a healthy lifestyle and getting preventive care can go a long way to promote health and wellness. Talk with your health care provider about what schedule of regular examinations is right for you. This is a good chance for you to check in with your provider about disease prevention and staying healthy. In between checkups, there are plenty of things you can do on your own. Experts have done a lot of research about which lifestyle changes and preventive measures are most likely to keep you healthy. Ask your health care provider for more information. WEIGHT AND DIET  Eat a healthy diet  Be sure to include plenty of vegetables, fruits, low-fat dairy products, and lean protein.  Do not eat a lot of foods high in solid fats, added sugars, or salt.  Get regular exercise. This is one of the most important things you can do for your health.  Most adults  should exercise for at least 150 minutes each week. The exercise should increase your heart rate and make you sweat (moderate-intensity exercise).  Most adults should also do strengthening exercises at least twice a week. This is in addition to the moderate-intensity exercise.  Maintain a healthy weight  Body mass index (BMI) is a measurement that can be used to identify possible weight problems. It estimates body fat based on height and weight. Your health care provider can help determine your BMI and help you achieve or maintain a healthy weight.  For females 52 years of age and older:   A BMI below 18.5 is considered underweight.  A BMI of 18.5 to 24.9 is normal.  A BMI of 25 to 29.9 is considered overweight.  A BMI of 30 and above is considered obese.  Watch levels of cholesterol and blood lipids  You should start having your blood tested for lipids and cholesterol at 71 years of age, then have this test every 5 years.  You may need to have your cholesterol levels checked more often if:  Your lipid or cholesterol levels are high.  You are older than 71 years of age.  You are at high risk for heart disease.  CANCER SCREENING   Lung Cancer  Lung cancer screening is recommended for adults 5-68 years old who are at high risk for lung cancer because of a history of smoking.  A yearly low-dose CT scan of the lungs  is recommended for people who:  Currently smoke.  Have quit within the past 15 years.  Have at least a 30-pack-year history of smoking. A pack year is smoking an average of one pack of cigarettes a day for 1 year.  Yearly screening should continue until it has been 15 years since you quit.  Yearly screening should stop if you develop a health problem that would prevent you from having lung cancer treatment.  Breast Cancer  Practice breast self-awareness. This means understanding how your breasts normally appear and feel.  It also means doing regular  breast self-exams. Let your health care provider know about any changes, no matter how small.  If you are in your 20s or 30s, you should have a clinical breast exam (CBE) by a health care provider every 1-3 years as part of a regular health exam.  If you are 34 or older, have a CBE every year. Also consider having a breast X-ray (mammogram) every year.  If you have a family history of breast cancer, talk to your health care provider about genetic screening.  If you are at high risk for breast cancer, talk to your health care provider about having an MRI and a mammogram every year.  Breast cancer gene (BRCA) assessment is recommended for women who have family members with BRCA-related cancers. BRCA-related cancers include:  Breast.  Ovarian.  Tubal.  Peritoneal cancers.  Results of the assessment will determine the need for genetic counseling and BRCA1 and BRCA2 testing. Cervical Cancer Routine pelvic examinations to screen for cervical cancer are no longer recommended for nonpregnant women who are considered low risk for cancer of the pelvic organs (ovaries, uterus, and vagina) and who do not have symptoms. A pelvic examination may be necessary if you have symptoms including those associated with pelvic infections. Ask your health care provider if a screening pelvic exam is right for you.   The Pap test is the screening test for cervical cancer for women who are considered at risk.  If you had a hysterectomy for a problem that was not cancer or a condition that could lead to cancer, then you no longer need Pap tests.  If you are older than 65 years, and you have had normal Pap tests for the past 10 years, you no longer need to have Pap tests.  If you have had past treatment for cervical cancer or a condition that could lead to cancer, you need Pap tests and screening for cancer for at least 20 years after your treatment.  If you no longer get a Pap test, assess your risk factors if  they change (such as having a new sexual partner). This can affect whether you should start being screened again.  Some women have medical problems that increase their chance of getting cervical cancer. If this is the case for you, your health care provider may recommend more frequent screening and Pap tests.  The human papillomavirus (HPV) test is another test that may be used for cervical cancer screening. The HPV test looks for the virus that can cause cell changes in the cervix. The cells collected during the Pap test can be tested for HPV.  The HPV test can be used to screen women 70 years of age and older. Getting tested for HPV can extend the interval between normal Pap tests from three to five years.  An HPV test also should be used to screen women of any age who have unclear Pap test results.  After 71 years of age, women should have HPV testing as often as Pap tests.  Colorectal Cancer  This type of cancer can be detected and often prevented.  Routine colorectal cancer screening usually begins at 71 years of age and continues through 71 years of age.  Your health care provider may recommend screening at an earlier age if you have risk factors for colon cancer.  Your health care provider may also recommend using home test kits to check for hidden blood in the stool.  A small camera at the end of a tube can be used to examine your colon directly (sigmoidoscopy or colonoscopy). This is done to check for the earliest forms of colorectal cancer.  Routine screening usually begins at age 70.  Direct examination of the colon should be repeated every 5-10 years through 71 years of age. However, you may need to be screened more often if early forms of precancerous polyps or small growths are found. Skin Cancer  Check your skin from head to toe regularly.  Tell your health care provider about any new moles or changes in moles, especially if there is a change in a mole's shape or  color.  Also tell your health care provider if you have a mole that is larger than the size of a pencil eraser.  Always use sunscreen. Apply sunscreen liberally and repeatedly throughout the day.  Protect yourself by wearing long sleeves, pants, a wide-brimmed hat, and sunglasses whenever you are outside. HEART DISEASE, DIABETES, AND HIGH BLOOD PRESSURE   Have your blood pressure checked at least every 1-2 years. High blood pressure causes heart disease and increases the risk of stroke.  If you are between 47 years and 40 years old, ask your health care provider if you should take aspirin to prevent strokes.  Have regular diabetes screenings. This involves taking a blood sample to check your fasting blood sugar level.  If you are at a normal weight and have a low risk for diabetes, have this test once every three years after 71 years of age.  If you are overweight and have a high risk for diabetes, consider being tested at a younger age or more often. PREVENTING INFECTION  Hepatitis B  If you have a higher risk for hepatitis B, you should be screened for this virus. You are considered at high risk for hepatitis B if:  You were born in a country where hepatitis B is common. Ask your health care provider which countries are considered high risk.  Your parents were born in a high-risk country, and you have not been immunized against hepatitis B (hepatitis B vaccine).  You have HIV or AIDS.  You use needles to inject street drugs.  You live with someone who has hepatitis B.  You have had sex with someone who has hepatitis B.  You get hemodialysis treatment.  You take certain medicines for conditions, including cancer, organ transplantation, and autoimmune conditions. Hepatitis C  Blood testing is recommended for:  Everyone born from [birth year] through 1967.  Anyone with known risk factors for hepatitis C. Sexually transmitted infections (STIs)  You should be screened for sexually  transmitted infections (STIs) including gonorrhea and chlamydia if:  You are sexually active and are younger than 71 years of age.  You are older than 71 years of age and your health care provider tells you that you are at risk for this type of infection.  Your sexual activity has changed since you were last screened  and you are at an increased risk for chlamydia or gonorrhea. Ask your health care provider if you are at risk.  If you do not have HIV, but are at risk, it may be recommended that you take a prescription medicine daily to prevent HIV infection. This is called pre-exposure prophylaxis (PrEP). You are considered at risk if:  You are sexually active and do not regularly use condoms or know the HIV status of your partner(s).  You take drugs by injection.  You are sexually active with a partner who has HIV. Talk with your health care provider about whether you are at high risk of being infected with HIV. If you choose to begin PrEP, you should first be tested for HIV. You should then be tested every 3 months for as long as you are taking PrEP.  PREGNANCY   If you are premenopausal and you may become pregnant, ask your health care provider about preconception counseling.  If you may become pregnant, take 400 to 800 micrograms (mcg) of folic acid every day.  If you want to prevent pregnancy, talk to your health care provider about birth control (contraception). OSTEOPOROSIS AND MENOPAUSE   Osteoporosis is a disease in which the bones lose minerals and strength with aging. This can result in serious bone fractures. Your risk for osteoporosis can be identified using a bone density scan.  If you are 100 years of age or older, or if you are at risk for osteoporosis and fractures, ask your health care provider if you should be screened.  Ask your health care provider whether you should take a calcium or vitamin D supplement to lower your risk for osteoporosis.  Menopause may have  certain physical symptoms and risks.  Hormone replacement therapy may reduce some of these symptoms and risks. Talk to your health care provider about whether hormone replacement therapy is right for you.  HOME CARE INSTRUCTIONS   Schedule regular health, dental, and eye exams.  Stay current with your immunizations.   Do not use any tobacco products including cigarettes, chewing tobacco, or electronic cigarettes.  If you are pregnant, do not drink alcohol.  If you are breastfeeding, limit how much and how often you drink alcohol.  Limit alcohol intake to no more than 1 drink per day for nonpregnant women. One drink equals 12 ounces of beer, 5 ounces of wine, or 1 ounces of hard liquor.  Do not use street drugs.  Do not share needles.  Ask your health care provider for help if you need support or information about quitting drugs.  Tell your health care provider if you often feel depressed.  Tell your health care provider if you have ever been abused or do not feel safe at home. Document Released: 12/16/2010 Document Revised: 10/17/2013 Document Reviewed: 05/04/2013 [facility] Patient Information 2015 [place], [place]. This information is not intended to replace advice given to you by your health care provider. Make sure you discuss any questions you have with your health care provider.

## 2014-12-27 ENCOUNTER — Telehealth: Payer: Self-pay | Admitting: Family

## 2014-12-27 NOTE — Telephone Encounter (Signed)
[facility] - Client TELEPHONE ADVICE RECORD [facility] Patient Name: Meredith Phillips DOB: 06-07-1946 Initial Comment Caller States she had the shingles shot yesterday. this morning she is sweating and dizzy. Nurse Assessment Nurse: Ladona Ridgel, RN, [place] Date/Time (Eastern Time): 12/27/2014 3:52:55 PM Confirm and document reason for call. If symptomatic, describe symptoms. ---PT has sweating off and on and moderate dizzy after having shingles vaccine yesterday. Denies a fever. She may have hot flashes but she doesnt have that as often as in the past Has the patient traveled out of the country within the last 30 days? ---No Does the patient require triage? ---Yes Related visit to physician within the last 2 weeks? ---Yes Does the PT have any chronic conditions? (i.e. diabetes, asthma, etc.) ---Yes List chronic conditions. ---DMGuidelines Guideline Title Affirmed Question Affirmed Notes Immunization Reactions Shingles (Herpes zoster; Zostavax) vaccine reactions (all triage questions negative) Dizziness - Lightheadedness Heart beating < 50 beats per minute OR > 140 beats per minute Final Disposition User Call EMS 911 Now [place], RN, [place] Comments she counted HR 36 then 32 - but she was talking and walking - moderately dizzy Disagree/Comply: Comply Disagree/Comply: Comply Call Id: [id number]

## 2015-01-01 ENCOUNTER — Other Ambulatory Visit (INDEPENDENT_AMBULATORY_CARE_PROVIDER_SITE_OTHER): Payer: Medicare HMO

## 2015-01-01 ENCOUNTER — Telehealth: Payer: Self-pay | Admitting: Family

## 2015-01-01 DIAGNOSIS — Z0000 Encounter for general adult medical examination without abnormal findings: Secondary | ICD-10-CM

## 2015-01-01 DIAGNOSIS — I10 Essential (primary) hypertension: Secondary | ICD-10-CM | POA: Diagnosis not present

## 2015-01-01 LAB — CBC
HCT: 36.4 % (ref 36.0–46.0)
Hemoglobin: 12.1 g/dL (ref 12.0–15.0)
MCHC: 33.3 g/dL (ref 30.0–36.0)
MCV: 88 fl (ref 78.0–100.0)
Platelets: 396 10*3/uL (ref 150.0–400.0)
RBC: 4.13 Mil/uL (ref 3.87–5.11)
RDW: 13.6 % (ref 11.5–15.5)
WBC: 5.4 10*3/uL (ref 4.0–10.5)

## 2015-01-01 LAB — MICROALBUMIN / CREATININE URINE RATIO
CREATININE, U: 126.9 mg/dL
Microalb Creat Ratio: 42.2 mg/g — ABNORMAL HIGH (ref 0.0–30.0)
Microalb, Ur: 53.5 mg/dL — ABNORMAL HIGH (ref 0.0–1.9)

## 2015-01-01 LAB — BASIC METABOLIC PANEL
BUN: 6 mg/dL (ref 6–23)
CO2: 28 meq/L (ref 19–32)
Calcium: 10 mg/dL (ref 8.4–10.5)
Chloride: 106 mEq/L (ref 96–112)
Creatinine, Ser: 0.66 mg/dL (ref 0.40–1.20)
GFR: 113.37 mL/min (ref >60.00)
Glucose, Bld: 110 mg/dL — ABNORMAL HIGH (ref 70–99)
POTASSIUM: 3.5 meq/L (ref 3.5–5.1)
SODIUM: 143 meq/L (ref 135–145)

## 2015-01-01 LAB — LIPID PANEL
Cholesterol: 119 mg/dL (ref 0–200)
HDL: 52.8 mg/dL (ref >39.00)
LDL Cholesterol: 55 mg/dL (ref 0–99)
NonHDL: 66.2
Total CHOL/HDL Ratio: 2
Triglycerides: 58 mg/dL (ref 0.0–149.0)
VLDL: 11.6 mg/dL (ref 0.0–40.0)

## 2015-01-01 LAB — TSH: TSH: 1.09 u[IU]/mL (ref 0.35–4.50)

## 2015-01-01 LAB — HEMOGLOBIN A1C: Hgb A1c MFr Bld: 5.6 % (ref 4.6–6.5)

## 2015-01-01 NOTE — Telephone Encounter (Signed)
Please inform patient that her blood work from her physical indicate that her white/red blood cells, cholesterol, thyroid, kidney function, electrolytes, and hemoglobin A1c are all normal. Her urine test did show some protein in her urine indicating kidney damage. The way to protect her kidneys is to work to control her blood pressure and blood sugars.

## 2015-01-03 NOTE — Telephone Encounter (Signed)
LVM for pt to call back.

## 2015-01-08 NOTE — Telephone Encounter (Signed)
Pt aware of results 

## 2015-01-17 ENCOUNTER — Encounter: Payer: Self-pay | Admitting: Family

## 2015-01-17 ENCOUNTER — Other Ambulatory Visit: Payer: Self-pay | Admitting: Family

## 2015-01-17 ENCOUNTER — Ambulatory Visit (INDEPENDENT_AMBULATORY_CARE_PROVIDER_SITE_OTHER): Payer: Medicare HMO | Admitting: Family

## 2015-01-17 VITALS — BP 118/78 | HR 83 | Temp 97.5°F | Wt 163.0 lb

## 2015-01-17 DIAGNOSIS — M5431 Sciatica, right side: Secondary | ICD-10-CM | POA: Diagnosis not present

## 2015-01-17 DIAGNOSIS — M5430 Sciatica, unspecified side: Secondary | ICD-10-CM | POA: Insufficient documentation

## 2015-01-17 MED ORDER — PREDNISONE 10 MG PO TABS: ORAL_TABLET | ORAL | Status: DC

## 2015-01-17 NOTE — Patient Instructions (Addendum)
Thank you for choosing [facility].  Summary/Instructions:   6 Day Prednisone Taper Instructions:   Day 1: Two tablets before breakfast, one after lunch, one after dinner, and two at bedtime.  Day 2: One tablet before breakfast, one after lunch, one after dinner, and two at bedtime Day 3: One tablet before breakfast, one after lunch, one after dinner, and one at bedtime Day 4: One tablet before breakfast, one after lunch, and one at bedtime Day 5: One tablet before breakfast and one at bedtime Day 6: One tablet before breakfast  Your prescription(s) have been submitted to your pharmacy or been printed and provided for you. Please take as directed and contact our office if you believe you are having problem(s) with the medication(s) or have any questions.  If your symptoms worsen or fail to improve, please contact our office for further instruction, or in case of emergency go directly to the emergency room at the closest medical facility.   Sciatica Sciatica is pain, weakness, numbness, or tingling along the path of the sciatic nerve. The nerve starts in the lower back and runs down the back of each leg. The nerve controls the muscles in the lower leg and in the back of the knee, while also providing sensation to the back of the thigh, lower leg, and the sole of your foot. Sciatica is a symptom of another medical condition. For instance, nerve damage or certain conditions, such as a herniated disk or bone spur on the spine, pinch or put pressure on the sciatic nerve. This causes the pain, weakness, or other sensations normally associated with sciatica. Generally, sciatica only affects one side of the body. CAUSES   Herniated or slipped disc.  Degenerative disk disease.  A pain disorder involving the narrow muscle in the buttocks (piriformis syndrome).  Pelvic injury or fracture.  Pregnancy.  Tumor (rare). SYMPTOMS  Symptoms can vary from mild to very severe. The symptoms  usually travel from the low back to the buttocks and down the back of the leg. Symptoms can include:  Mild tingling or dull aches in the lower back, leg, or hip.  Numbness in the back of the calf or sole of the foot.  Burning sensations in the lower back, leg, or hip.  Sharp pains in the lower back, leg, or hip.  Leg weakness.  Severe back pain inhibiting movement. These symptoms may get worse with coughing, sneezing, laughing, or prolonged sitting or standing. Also, being overweight may worsen symptoms. DIAGNOSIS  Your caregiver will perform a physical exam to look for common symptoms of sciatica. He or she may ask you to do certain movements or activities that would trigger sciatic nerve pain. Other tests may be performed to find the cause of the sciatica. These may include:  Blood tests.  X-rays.  Imaging tests, such as an MRI or CT scan. TREATMENT  Treatment is directed at the cause of the sciatic pain. Sometimes, treatment is not necessary and the pain and discomfort goes away on its own. If treatment is needed, your caregiver may suggest:  Over-the-counter medicines to relieve pain.  Prescription medicines, such as anti-inflammatory medicine, muscle relaxants, or narcotics.  Applying heat or ice to the painful area.  Steroid injections to lessen pain, irritation, and inflammation around the nerve.  Reducing activity during periods of pain.  Exercising and stretching to strengthen your abdomen and improve flexibility of your spine. Your caregiver may suggest losing weight if the extra weight makes the back pain worse.  Physical therapy.  Surgery to eliminate what is pressing or pinching the nerve, such as a bone spur or part of a herniated disk. HOME CARE INSTRUCTIONS   Only take over-the-counter or prescription medicines for pain or discomfort as directed by your caregiver.  Apply ice to the affected area for 20 minutes, 3-4 times a day for the first 48-72 hours.  Then try heat in the same way.  Exercise, stretch, or perform your usual activities if these do not aggravate your pain.  Attend physical therapy sessions as directed by your caregiver.  Keep all follow-up appointments as directed by your caregiver.  Do not wear high heels or shoes that do not provide proper support.  Check your mattress to see if it is too soft. A firm mattress may lessen your pain and discomfort. SEEK IMMEDIATE MEDICAL CARE IF:   You lose control of your bowel or bladder (incontinence).  You have increasing weakness in the lower back, pelvis, buttocks, or legs.  You have redness or swelling of your back.  You have a burning sensation when you urinate.  You have pain that gets worse when you lie down or awakens you at night.  Your pain is worse than you have experienced in the past.  Your pain is lasting longer than 4 weeks.  You are suddenly losing weight without reason. MAKE SURE YOU:  Understand these instructions.  Will watch your condition.  Will get help right away if you are not doing well or get worse. Document Released: 05/27/2001 Document Revised: 12/02/2011 Document Reviewed: 10/12/2011 [facility] Patient Information 2015 [place], [place]. This information is not intended to replace advice given to you by your health care provider. Make sure you discuss any questions you have with your health care provider.  Low Back Sprain with Rehab  A sprain is an injury in which a ligament is torn. The ligaments of the lower back are vulnerable to sprains. However, they are strong and require great force to be injured. These ligaments are important for stabilizing the spinal column. Sprains are classified into three categories. Grade 1 sprains cause pain, but the tendon is not lengthened. Grade 2 sprains include a lengthened ligament, due to the ligament being stretched or partially ruptured. With grade 2 sprains there is still function, although the function may  be decreased. Grade 3 sprains involve a complete tear of the tendon or muscle, and function is usually impaired. SYMPTOMS   Severe pain in the lower back.  Sometimes, a feeling of a "pop," "snap," or tear, at the time of injury.  Tenderness and sometimes swelling at the injury site.  Uncommonly, bruising (contusion) within 48 hours of injury.  Muscle spasms in the back. CAUSES  Low back sprains occur when a force is placed on the ligaments that is greater than they can handle. Common causes of injury include:  Performing a stressful act while off-balance.  Repetitive stressful activities that involve movement of the lower back.  Direct hit (trauma) to the lower back. RISK INCREASES WITH:  Contact sports (football, wrestling).  Collisions (major skiing accidents).  Sports that require throwing or lifting (baseball, weightlifting).  Sports involving twisting of the spine (gymnastics, diving, tennis, golf).  Poor strength and flexibility.  Inadequate protection.  Previous back injury or surgery (especially fusion). PREVENTION  Wear properly fitted and padded protective equipment.  Warm up and stretch properly before activity.  Allow for adequate recovery between workouts.  Maintain physical fitness:  Strength, flexibility, and endurance.  Cardiovascular  fitness.  Maintain a healthy body weight. PROGNOSIS  If treated properly, low back sprains usually heal with non-surgical treatment. The length of time for healing depends on the severity of the injury.  RELATED COMPLICATIONS   Recurring symptoms, resulting in a chronic problem.  Chronic inflammation and pain in the low back.  Delayed healing or resolution of symptoms, especially if activity is resumed too soon.  Prolonged impairment.  Unstable or arthritic joints of the low back. TREATMENT  Treatment first involves the use of ice and medicine, to reduce pain and inflammation. The use of strengthening and  stretching exercises may help reduce pain with activity. These exercises may be performed at home or with a therapist. Severe injuries may require referral to a therapist for further evaluation and treatment, such as ultrasound. Your caregiver may advise that you wear a back brace or corset, to help reduce pain and discomfort. Often, prolonged bed rest results in greater harm then benefit. Corticosteroid injections may be recommended. However, these should be reserved for the most serious cases. It is important to avoid using your back when lifting objects. At night, sleep on your back on a firm mattress, with a pillow placed under your knees. If non-surgical treatment is unsuccessful, surgery may be needed.  MEDICATION   If pain medicine is needed, nonsteroidal anti-inflammatory medicines (aspirin and ibuprofen), or other minor pain relievers (acetaminophen), are often advised.  Do not take pain medicine for 7 days before surgery.  Prescription pain relievers may be given, if your caregiver thinks they are needed. Use only as directed and only as much as you need.  Ointments applied to the skin may be helpful.  Corticosteroid injections may be given by your caregiver. These injections should be reserved for the most serious cases, because they may only be given a certain number of times.  HEAT AND COLD  Cold treatment (icing) should be applied for 10 to 15 minutes every 2 to 3 hours for inflammation and pain, and immediately after activity that aggravates your symptoms. Use ice packs or an ice massage.  Heat treatment may be used before performing stretching and strengthening activities prescribed by your caregiver, physical therapist, or athletic trainer. Use a heat pack or a warm water soak. SEEK MEDICAL CARE IF:   Symptoms get worse or do not improve in 2 to 4 weeks, despite treatment.  You develop numbness or weakness in either leg.  You lose bowel or bladder function.  Any of the  following occur after surgery: fever, increased pain, swelling, redness, drainage of fluids, or bleeding in the affected area.  New, unexplained symptoms develop. (Drugs used in treatment may produce side effects.) EXERCISES  RANGE OF MOTION (ROM) AND STRETCHING EXERCISES - Low Back Sprain Most people with lower back pain will find that their symptoms get worse with excessive bending forward (flexion) or arching at the lower back (extension). The exercises that will help resolve your symptoms will focus on the opposite motion.  Your physician, physical therapist or athletic trainer will help you determine which exercises will be most helpful to resolve your lower back pain. Do not complete any exercises without first consulting with your caregiver. Discontinue any exercises which make your symptoms worse, until you speak to your caregiver. If you have pain, numbness or tingling which travels down into your buttocks, leg or foot, the goal of the therapy is for these symptoms to move closer to your back and eventually resolve. Sometimes, these leg symptoms will get better,  but your lower back pain may worsen. This is often an indication of progress in your rehabilitation. Be very alert to any changes in your symptoms and the activities in which you participated in the 24 hours prior to the change. Sharing this information with your caregiver will allow him or her to most efficiently treat your condition. These exercises may help you when beginning to rehabilitate your injury. Your symptoms may resolve with or without further involvement from your physician, physical therapist or athletic trainer. While completing these exercises, remember:   Restoring tissue flexibility helps normal motion to return to the joints. This allows healthier, less painful movement and activity.  An effective stretch should be held for at least 30 seconds.  A stretch should never be painful. You should only feel a gentle  lengthening or release in the stretched tissue. FLEXION RANGE OF MOTION AND STRETCHING EXERCISES: STRETCH - Flexion, Single Knee to Chest   Lie on a firm bed or floor with both legs extended in front of you.  Keeping one leg in contact with the floor, bring your opposite knee to your chest. Hold your leg in place by either grabbing behind your thigh or at your knee.  Pull until you feel a gentle stretch in your low back. Hold __________ seconds.  Slowly release your grasp and repeat the exercise with the opposite side. Repeat __________ times. Complete this exercise __________ times per day.  STRETCH - Flexion, Double Knee to Chest  Lie on a firm bed or floor with both legs extended in front of you.  Keeping one leg in contact with the floor, bring your opposite knee to your chest.  Tense your stomach muscles to support your back and then lift your other knee to your chest. Hold your legs in place by either grabbing behind your thighs or at your knees.  Pull both knees toward your chest until you feel a gentle stretch in your low back. Hold __________ seconds.  Tense your stomach muscles and slowly return one leg at a time to the floor. Repeat __________ times. Complete this exercise __________ times per day.  STRETCH - Low Trunk Rotation  Lie on a firm bed or floor. Keeping your legs in front of you, bend your knees so they are both pointed toward the ceiling and your feet are flat on the floor.  Extend your arms out to the side. This will stabilize your upper body by keeping your shoulders in contact with the floor.  Gently and slowly drop both knees together to one side until you feel a gentle stretch in your low back. Hold for __________ seconds.  Tense your stomach muscles to support your lower back as you bring your knees back to the starting position. Repeat the exercise to the other side. Repeat __________ times. Complete this exercise __________ times per day  EXTENSION  RANGE OF MOTION AND FLEXIBILITY EXERCISES: STRETCH - Extension, Prone on Elbows   Lie on your stomach on the floor, a bed will be too soft. Place your palms about shoulder width apart and at the height of your head.  Place your elbows under your shoulders. If this is too painful, stack pillows under your chest.  Allow your body to relax so that your hips drop lower and make contact more completely with the floor.  Hold this position for __________ seconds.  Slowly return to lying flat on the floor. Repeat __________ times. Complete this exercise __________ times per day.  RANGE OF MOTION -  Extension, Prone Press Ups  Lie on your stomach on the floor, a bed will be too soft. Place your palms about shoulder width apart and at the height of your head.  Keeping your back as relaxed as possible, slowly straighten your elbows while keeping your hips on the floor. You may adjust the placement of your hands to maximize your comfort. As you gain motion, your hands will come more underneath your shoulders.  Hold this position __________ seconds.  Slowly return to lying flat on the floor. Repeat __________ times. Complete this exercise __________ times per day.  RANGE OF MOTION- Quadruped, Neutral Spine   Assume a hands and knees position on a firm surface. Keep your hands under your shoulders and your knees under your hips. You may place padding under your knees for comfort.  Drop your head and point your tailbone toward the ground below you. This will round out your lower back like an angry cat. Hold this position for __________ seconds.  Slowly lift your head and release your tail bone so that your back sags into a large arch, like an old horse.  Hold this position for __________ seconds.  Repeat this until you feel limber in your low back.  Now, find your "sweet spot." This will be the most comfortable position somewhere between the two previous positions. This is your neutral spine. Once  you have found this position, tense your stomach muscles to support your low back.  Hold this position for __________ seconds. Repeat __________ times. Complete this exercise __________ times per day.  STRENGTHENING EXERCISES - Low Back Sprain These exercises may help you when beginning to rehabilitate your injury. These exercises should be done near your "sweet spot." This is the neutral, low-back arch, somewhere between fully rounded and fully arched, that is your least painful position. When performed in this safe range of motion, these exercises can be used for people who have either a flexion or extension based injury. These exercises may resolve your symptoms with or without further involvement from your physician, physical therapist or athletic trainer. While completing these exercises, remember:   Muscles can gain both the endurance and the strength needed for everyday activities through controlled exercises.  Complete these exercises as instructed by your physician, physical therapist or athletic trainer. Increase the resistance and repetitions only as guided.  You may experience muscle soreness or fatigue, but the pain or discomfort you are trying to eliminate should never worsen during these exercises. If this pain does worsen, stop and make certain you are following the directions exactly. If the pain is still present after adjustments, discontinue the exercise until you can discuss the trouble with your caregiver. STRENGTHENING - Deep Abdominals, Pelvic Tilt   Lie on a firm bed or floor. Keeping your legs in front of you, bend your knees so they are both pointed toward the ceiling and your feet are flat on the floor.  Tense your lower abdominal muscles to press your low back into the floor. This motion will rotate your pelvis so that your tail bone is scooping upwards rather than pointing at your feet or into the floor. With a gentle tension and even breathing, hold this position for  __________ seconds. Repeat __________ times. Complete this exercise __________ times per day.  STRENGTHENING - Abdominals, Crunches   Lie on a firm bed or floor. Keeping your legs in front of you, bend your knees so they are both pointed toward the ceiling and your feet are  flat on the floor. Cross your arms over your chest.  Slightly tip your chin down without bending your neck.  Tense your abdominals and slowly lift your trunk high enough to just clear your shoulder blades. Lifting higher can put excessive stress on the lower back and does not further strengthen your abdominal muscles.  Control your return to the starting position. Repeat __________ times. Complete this exercise __________ times per day.  STRENGTHENING - Quadruped, Opposite UE/LE Lift   Assume a hands and knees position on a firm surface. Keep your hands under your shoulders and your knees under your hips. You may place padding under your knees for comfort.  Find your neutral spine and gently tense your abdominal muscles so that you can maintain this position. Your shoulders and hips should form a rectangle that is parallel with the floor and is not twisted.  Keeping your trunk steady, lift your right hand no higher than your shoulder and then your left leg no higher than your hip. Make sure you are not holding your breath. Hold this position for __________ seconds.  Continuing to keep your abdominal muscles tense and your back steady, slowly return to your starting position. Repeat with the opposite arm and leg. Repeat __________ times. Complete this exercise __________ times per day.  STRENGTHENING - Abdominals and Quadriceps, Straight Leg Raise   Lie on a firm bed or floor with both legs extended in front of you.  Keeping one leg in contact with the floor, bend the other knee so that your foot can rest flat on the floor.  Find your neutral spine, and tense your abdominal muscles to maintain your spinal position  throughout the exercise.  Slowly lift your straight leg off the floor about 6 inches for a count of 15, making sure to not hold your breath.  Still keeping your neutral spine, slowly lower your leg all the way to the floor. Repeat this exercise with each leg __________ times. Complete this exercise __________ times per day. POSTURE AND BODY MECHANICS CONSIDERATIONS - Low Back Sprain Keeping correct posture when sitting, standing or completing your activities will reduce the stress put on different body tissues, allowing injured tissues a chance to heal and limiting painful experiences. The following are general guidelines for improved posture. Your physician or physical therapist will provide you with any instructions specific to your needs. While reading these guidelines, remember:  The exercises prescribed by your provider will help you have the flexibility and strength to maintain correct postures.  The correct posture provides the best environment for your joints to work. All of your joints have less wear and tear when properly supported by a spine with good posture. This means you will experience a healthier, less painful body.  Correct posture must be practiced with all of your activities, especially prolonged sitting and standing. Correct posture is as important when doing repetitive low-stress activities (typing) as it is when doing a single heavy-load activity (lifting). RESTING POSITIONS Consider which positions are most painful for you when choosing a resting position. If you have pain with flexion-based activities (sitting, bending, stooping, squatting), choose a position that allows you to rest in a less flexed posture. You would want to avoid curling into a fetal position on your side. If your pain worsens with extension-based activities (prolonged standing, working overhead), avoid resting in an extended position such as sleeping on your stomach. Most people will find more comfort when  they rest with their spine in a more  neutral position, neither too rounded nor too arched. Lying on a non-sagging bed on your side with a pillow between your knees, or on your back with a pillow under your knees will often provide some relief. Keep in mind, being in any one position for a prolonged period of time, no matter how correct your posture, can still lead to stiffness. PROPER SITTING POSTURE In order to minimize stress and discomfort on your spine, you must sit with correct posture. Sitting with good posture should be effortless for a healthy body. Returning to good posture is a gradual process. Many people can work toward this most comfortably by using various supports until they have the flexibility and strength to maintain this posture on their own. When sitting with proper posture, your ears will fall over your shoulders and your shoulders will fall over your hips. You should use the back of the chair to support your upper back. Your lower back will be in a neutral position, just slightly arched. You may place a small pillow or folded towel at the base of your lower back for  support.  When working at a desk, create an environment that supports good, upright posture. Without extra support, muscles tire, which leads to excessive strain on joints and other tissues. Keep these recommendations in mind: CHAIR:  A chair should be able to slide under your desk when your back makes contact with the back of the chair. This allows you to work closely.  The chair's height should allow your eyes to be level with the upper part of your monitor and your hands to be slightly lower than your elbows. BODY POSITION  Your feet should make contact with the floor. If this is not possible, use a foot rest.  Keep your ears over your shoulders. This will reduce stress on your neck and low back. INCORRECT SITTING POSTURES  If you are feeling tired and unable to assume a healthy sitting posture, do not slouch or  slump. This puts excessive strain on your back tissues, causing more damage and pain. Healthier options include:  Using more support, like a lumbar pillow.  Switching tasks to something that requires you to be upright or walking.  Talking a brief walk.  Lying down to rest in a neutral-spine position. PROLONGED STANDING WHILE SLIGHTLY LEANING FORWARD  When completing a task that requires you to lean forward while standing in one place for a long time, place either foot up on a stationary 2-4 inch high object to help maintain the best posture. When both feet are on the ground, the lower back tends to lose its slight inward curve. If this curve flattens (or becomes too large), then the back and your other joints will experience too much stress, tire more quickly, and can cause pain. CORRECT STANDING POSTURES Proper standing posture should be assumed with all daily activities, even if they only take a few moments, like when brushing your teeth. As in sitting, your ears should fall over your shoulders and your shoulders should fall over your hips. You should keep a slight tension in your abdominal muscles to brace your spine. Your tailbone should point down to the ground, not behind your body, resulting in an over-extended swayback posture.  INCORRECT STANDING POSTURES  Common incorrect standing postures include a forward head, locked knees and/or an excessive swayback. WALKING Walk with an upright posture. Your ears, shoulders and hips should all line-up. PROLONGED ACTIVITY IN A FLEXED POSITION When completing a task that requires  you to bend forward at your waist or lean over a low surface, try to find a way to stabilize 3 out of 4 of your limbs. You can place a hand or elbow on your thigh or rest a knee on the surface you are reaching across. This will provide you more stability, so that your muscles do not tire as quickly. By keeping your knees relaxed, or slightly bent, you will also reduce stress  across your lower back. CORRECT LIFTING TECHNIQUES DO :  Assume a wide stance. This will provide you more stability and the opportunity to get as close as possible to the object which you are lifting.  Tense your abdominals to brace your spine. Bend at the knees and hips. Keeping your back locked in a neutral-spine position, lift using your leg muscles. Lift with your legs, keeping your back straight.  Test the weight of unknown objects before attempting to lift them.  Try to keep your elbows locked down at your sides in order get the best strength from your shoulders when carrying an object.  Always ask for help when lifting heavy or awkward objects. INCORRECT LIFTING TECHNIQUES DO NOT:   Lock your knees when lifting, even if it is a small object.  Bend and twist. Pivot at your feet or move your feet when needing to change directions.  Assume that you can safely pick up even a paperclip without proper posture. Document Released: 06/02/2005 Document Revised: 08/25/2011 Document Reviewed: 09/14/2008 [facility] Patient Information 2015 [place], [place]. This information is not intended to replace advice given to you by your health care provider. Make sure you discuss any questions you have with your health care provider.

## 2015-01-17 NOTE — Progress Notes (Signed)
Subjective:    Patient ID: Meredith Phillips, female    DOB: Jul 28, 1945, 69 y.o.   MRN: [id number]  Chief Complaint  Patient presents with  . Leg Pain    HPI:  Meredith Phillips is a 69 y.o. female with a PMH of arthritis, diabetes, hyperlipidemia, hypertension, and appendectomy who presents today for an acute office visit.   1.) Right Lower Extremity - Associated symptom of pain located in her right lower extremity has been going on for over a year. Modifying factors include a cortisone injection which initially helped and then received a second shot which only lasted about 1 week. Indicates that "she has a muscle that has detached on her right lower extremity and moved to her back."  She has had an MRI of her lower back and told that her "spine is all twisted up." She was referred to a back specialist as it was believed to be her lower back. Current pain goes from her right knee to her hip. Describes the pain as sharp and achy. Severity is enough to alter her gait and decrease her ability to ambulate. She currently walks with a cane for support.    Allergies  Allergen Reactions  . Codeine   . Other     Doesn't like to take pain medication due to hallucinations.  [name] Shellfish Allergy     Current Outpatient Prescriptions on File Prior to Visit  Medication Sig Dispense Refill  . amLODipine (NORVASC) 10 MG tablet Take 10 mg by mouth daily.    . carvedilol (COREG) 3.125 MG tablet Take 3.125 mg by mouth 2 (two) times daily with a meal.    . Cholecalciferol (VITAMIN D-3) 1000 UNITS CAPS Take 1,000 Units by mouth.    . cloNIDine (CATAPRES) 0.1 MG tablet Take 0.1 mg by mouth 2 (two) times daily.    [name] dicyclomine (BENTYL) 10 MG capsule Take 10 mg by mouth as needed for spasms.    [name] esomeprazole (NEXIUM) 40 MG capsule Take 40 mg by mouth daily at 12 noon.    [name] losartan (COZAAR) 100 MG tablet Take 100 mg by mouth daily.    . metFORMIN (GLUCOPHAGE) 500 MG tablet Take 500 mg by mouth daily.    .  simvastatin (ZOCOR) 20 MG tablet Take 20 mg by mouth daily.     No current facility-administered medications on file prior to visit.     Review of Systems  Constitutional: Negative for fever and chills.  Musculoskeletal:       Positive for right lower extremity pain.   Neurological: Negative for weakness and numbness.      Objective:    BP 118/78 mmHg  Pulse 83  Temp(Src) 97.5 F (36.4 C)  Wt 163 lb (73.936 kg)  SpO2 98% Nursing note and vital signs reviewed.  Physical Exam  Constitutional: She is oriented to person, place, and time. She appears well-developed and well-nourished. No distress.  Cardiovascular: Normal rate, regular rhythm, normal heart sounds and intact distal pulses.   Pulmonary/Chest: Effort normal and breath sounds normal.  Musculoskeletal:  Right back: No obvious deformity, discoloration, or edema noted. Tenderness along right gluteal area around sciatic nerve. Displays normal range of motion with pain in flexion and right lateral bending. Straight leg raise is negative. Distal pulses, sensation, and reflexes are intact and appropriate.  Neurological: She is alert and oriented to person, place, and time.  Skin: Skin is warm and dry.  Psychiatric: She has a normal mood and  affect. Her behavior is normal. Judgment and thought content normal.       Assessment & Plan:   Problem List Items Addressed This Visit      Nervous and Auditory   Sciatica - Primary    Symptoms and exam consistent with right-sided sciatica with possible right sided lumbar dysfunction. Start prednisone taper to help with inflammation. Home therapy exercises provided. Follow up with [facility] as previously scheduled. Follow up after prednisone taper and orthopedic appointment.       Relevant Medications   predniSONE (DELTASONE) 10 MG tablet

## 2015-01-17 NOTE — Progress Notes (Signed)
Pre visit review using our clinic review tool, if applicable. No additional management support is needed unless otherwise documented below in the visit note. 

## 2015-01-17 NOTE — Assessment & Plan Note (Signed)
Symptoms and exam consistent with right-sided sciatica with possible right sided lumbar dysfunction. Start prednisone taper to help with inflammation. Home therapy exercises provided. Follow up with [facility] as previously scheduled. Follow up after prednisone taper and orthopedic appointment.

## 2015-01-23 ENCOUNTER — Ambulatory Visit (INDEPENDENT_AMBULATORY_CARE_PROVIDER_SITE_OTHER): Payer: Medicare HMO | Admitting: Family

## 2015-01-23 ENCOUNTER — Encounter: Payer: Self-pay | Admitting: Family

## 2015-01-23 VITALS — BP 150/94 | HR 84 | Temp 98.0°F | Resp 18 | Ht 63.0 in | Wt 164.0 lb

## 2015-01-23 DIAGNOSIS — M5431 Sciatica, right side: Secondary | ICD-10-CM

## 2015-01-23 NOTE — Progress Notes (Signed)
   Subjective:    Patient ID: Meredith Phillips, female    DOB: [birth year], 69 y.o.   MRN: [id number]  Chief Complaint  Patient presents with  . Follow-up    Says sciatica is getting better, still has to turn from one leg to another while sitting, hurts worse at night,     HPI:  Meredith Phillips is a 69 y.o. female with a PMH of arthritis, diabetes, GERD, hyperlipidemia, and hypertension who presents today for an office follow up.     Has been seen by the [facility] and diagnosed with spinal stenosis of the lumbosacral region. She notes improvement in her sciatica since completion of the previously presribed prednisone. Received paperwork from specialist provider requesting A1c drawn to continue treatment.    Lab Results  Component Value Date   HGBA1C 5.6 01/01/2015    Allergies  Allergen Reactions  . Codeine   . Other     Doesn't like to take pain medication due to hallucinations.  [name] Shellfish Allergy     Current Outpatient Prescriptions on File Prior to Visit  Medication Sig Dispense Refill  . amLODipine (NORVASC) 10 MG tablet Take 10 mg by mouth daily.    . carvedilol (COREG) 3.125 MG tablet Take 3.125 mg by mouth 2 (two) times daily with a meal.    . Cholecalciferol (VITAMIN D-3) 1000 UNITS CAPS Take 1,000 Units by mouth.    . cloNIDine (CATAPRES) 0.1 MG tablet Take 0.1 mg by mouth 2 (two) times daily.    [name] dicyclomine (BENTYL) 10 MG capsule Take 10 mg by mouth as needed for spasms.    [name] esomeprazole (NEXIUM) 40 MG capsule Take 40 mg by mouth daily at 12 noon.    [name] losartan (COZAAR) 100 MG tablet Take 100 mg by mouth daily.    . metFORMIN (GLUCOPHAGE) 500 MG tablet Take 500 mg by mouth daily.    . simvastatin (ZOCOR) 20 MG tablet Take 20 mg by mouth daily.     No current facility-administered medications on file prior to visit.    Review of Systems  Musculoskeletal: Positive for back pain.  Neurological: Negative for weakness and numbness.        Objective:    BP 150/94 mmHg  Pulse 84  Temp(Src) 98 F (36.7 C) (Oral)  Resp 18  Ht 5\' 3"  (1.6 m)  Wt 164 lb (74.39 kg)  BMI 29.06 kg/m2  SpO2 99% Nursing note and vital signs reviewed.   Physical Exam  Constitutional: She is oriented to person, place, and time. She appears well-developed and well-nourished. No distress.  Cardiovascular: Normal rate, regular rhythm, normal heart sounds and intact distal pulses.   Pulmonary/Chest: Effort normal and breath sounds normal.  Neurological: She is alert and oriented to person, place, and time.  Skin: Skin is warm and dry.  Psychiatric: She has a normal mood and affect. Her behavior is normal. Judgment and thought content normal.       Assessment & Plan:   Problem List Items Addressed This Visit      Nervous and Auditory   Sciatica - Primary    Sciatica is improved with previously prescribed prednisone. Currently awaiting injections for her spinal stenosis. A1c previously drawn and noted to be 5.6. Results faxed to [facility] per patient and provider request. Continue management per orthopedics follow up with primary care as needed.

## 2015-01-23 NOTE — Patient Instructions (Signed)
Thank you for choosing [facility].  Summary/Instructions:  Continue to follow up with [facility].   If your symptoms worsen or fail to improve, please contact our office for further instruction, or in case of emergency go directly to the emergency room at the closest medical facility.

## 2015-01-23 NOTE — Progress Notes (Signed)
Pre visit review using our clinic review tool, if applicable. No additional management support is needed unless otherwise documented below in the visit note. 

## 2015-01-23 NOTE — Assessment & Plan Note (Signed)
Sciatica is improved with previously prescribed prednisone. Currently awaiting injections for her spinal stenosis. A1c previously drawn and noted to be 5.6. Results faxed to [facility] per patient and provider request. Continue management per orthopedics follow up with primary care as needed.

## 2015-02-17 ENCOUNTER — Ambulatory Visit (INDEPENDENT_AMBULATORY_CARE_PROVIDER_SITE_OTHER): Payer: Medicare HMO | Admitting: Family Medicine

## 2015-02-17 ENCOUNTER — Encounter: Payer: Self-pay | Admitting: Family Medicine

## 2015-02-17 VITALS — BP 132/82 | Temp 97.9°F | Ht 63.0 in | Wt 164.0 lb

## 2015-02-17 DIAGNOSIS — L03115 Cellulitis of right lower limb: Secondary | ICD-10-CM | POA: Diagnosis not present

## 2015-02-17 DIAGNOSIS — I10 Essential (primary) hypertension: Secondary | ICD-10-CM

## 2015-02-17 MED ORDER — CEPHALEXIN 500 MG PO CAPS: 500.0000 mg | ORAL_CAPSULE | Freq: Three times a day (TID) | ORAL | Status: DC

## 2015-02-17 NOTE — Progress Notes (Signed)
Pre visit review using our clinic review tool, if applicable. No additional management support is needed unless otherwise documented below in the visit note. 

## 2015-02-17 NOTE — Patient Instructions (Signed)
Ice and apply Aspercreme 3 x daily Start a probiotic daily such as [employer] or [facility]  Call if does not get better  Cellulitis Cellulitis is an infection of the skin and the tissue beneath it. The infected area is usually red and tender. Cellulitis occurs most often in the arms and lower legs.  CAUSES  Cellulitis is caused by bacteria that enter the skin through cracks or cuts in the skin. The most common types of bacteria that cause cellulitis are staphylococci and streptococci. SIGNS AND SYMPTOMS   Redness and warmth.  Swelling.  Tenderness or pain.  Fever. DIAGNOSIS  Your health care provider can usually determine what is wrong based on a physical exam. Blood tests may also be done. TREATMENT  Treatment usually involves taking an antibiotic medicine. HOME CARE INSTRUCTIONS   Take your antibiotic medicine as directed by your health care provider. Finish the antibiotic even if you start to feel better.  Keep the infected arm or leg elevated to reduce swelling.  Apply a warm cloth to the affected area up to 4 times per day to relieve pain.  Take medicines only as directed by your health care provider.  Keep all follow-up visits as directed by your health care provider. SEEK MEDICAL CARE IF:   You notice red streaks coming from the infected area.  Your red area gets larger or turns dark in color.  Your bone or joint underneath the infected area becomes painful after the skin has healed.  Your infection returns in the same area or another area.  You notice a swollen bump in the infected area.  You develop new symptoms.  You have a fever. SEEK IMMEDIATE MEDICAL CARE IF:   You feel very sleepy.  You develop vomiting or diarrhea.  You have a general ill feeling (malaise) with muscle aches and pains. MAKE SURE YOU:   Understand these instructions.  Will watch your condition.  Will get help right away if you are not doing well or get  worse. Document Released: 03/12/2005 Document Revised: 10/17/2013 Document Reviewed: 08/18/2011 [facility] Patient Information 2015 [place], [place]. This information is not intended to replace advice given to you by your health care provider. Make sure you discuss any questions you have with your health care provider.

## 2015-02-18 ENCOUNTER — Encounter: Payer: Self-pay | Admitting: Family Medicine

## 2015-02-18 DIAGNOSIS — L0390 Cellulitis, unspecified: Secondary | ICD-10-CM

## 2015-02-18 DIAGNOSIS — I10 Essential (primary) hypertension: Secondary | ICD-10-CM

## 2015-02-18 HISTORY — DX: Essential (primary) hypertension: I10

## 2015-02-18 HISTORY — DX: Cellulitis, unspecified: L03.90

## 2015-02-18 NOTE — Progress Notes (Signed)
Subjective:    Patient ID: Meredith Phillips, female    DOB: 1945-12-29, 69 y.o.   MRN: [id number]  Chief Complaint  Patient presents with  . right leg pain    HPI Patient is in today for evaluation of a painful lesion on right anterior plateau. Has been present for 3 days. Denies any trauma or long trips. No injury. No malaise, fever, but does acknowledge fatigue has increased this week. Denies CP/palp/SOB/HA/congestion/fevers/GI or GU c/o. Taking meds as prescribed  Past Medical History  Diagnosis Date  . Diabetes mellitus without complication   . GERD (gastroesophageal reflux disease)   . Hypercholesteremia   . Hypertension   . Diabetic peripheral neuropathy   . Arthritis   . Liver disease   . Polyp of colon   . Chicken pox   . Benign essential HTN 02/18/2015  . Cellulitis 02/18/2015    Past Surgical History  Procedure Laterality Date  . Hernia repair    . Rotator cuff repair    . Knee surgery    . Appendectomy    . Tonsillectomy      Family History  Problem Relation Age of Onset  . Healthy Mother   . Healthy Father   . Healthy Paternal Grandmother   . Healthy Paternal Grandfather     Social History   Social History  . Marital Status: Married    Spouse Name: N/A  . Number of Children: 5  . Years of Education: 12   Occupational History  . Retired    Social History Main Topics  . Smoking status: Never Smoker   . Smokeless tobacco: Never Used  . Alcohol Use: No  . Drug Use: No  . Sexual Activity: Not on file   Other Topics Concern  . Not on file   Social History Narrative   Fun: paint, word search   Feels safe where she is living and no concern for abuse   Denies religious beliefs effecting healthcare.     Outpatient Prescriptions Prior to Visit  Medication Sig Dispense Refill  . amLODipine (NORVASC) 10 MG tablet Take 10 mg by mouth daily.    . carvedilol (COREG) 3.125 MG tablet Take 3.125 mg by mouth 2 (two) times daily with a meal.    .  Cholecalciferol (VITAMIN D-3) 1000 UNITS CAPS Take 1,000 Units by mouth.    . cloNIDine (CATAPRES) 0.1 MG tablet Take 0.1 mg by mouth 2 (two) times daily.    [name] dicyclomine (BENTYL) 10 MG capsule Take 10 mg by mouth as needed for spasms.    [name] esomeprazole (NEXIUM) 40 MG capsule Take 40 mg by mouth daily at 12 noon.    [name] losartan (COZAAR) 100 MG tablet Take 100 mg by mouth daily.    . metFORMIN (GLUCOPHAGE) 500 MG tablet Take 500 mg by mouth daily.    . simvastatin (ZOCOR) 20 MG tablet Take 20 mg by mouth daily.     No facility-administered medications prior to visit.    Allergies  Allergen Reactions  . Codeine   . Other     Doesn't like to take pain medication due to hallucinations.  . Shellfish Allergy     Review of Systems  Constitutional: Positive for malaise/fatigue. Negative for fever, chills and weight loss.  HENT: Negative for congestion.   Respiratory: Negative for cough, sputum production and shortness of breath.   Cardiovascular: Negative for chest pain and palpitations.  Gastrointestinal: Negative for heartburn, nausea, vomiting, abdominal pain, diarrhea, constipation,  blood in stool and melena.  Musculoskeletal: Positive for joint pain. Negative for myalgias.  Neurological: Negative for dizziness and headaches.  Psychiatric/Behavioral: Negative for depression.       Objective:    Physical Exam  Constitutional: She appears well-developed and well-nourished. She appears distressed.  HENT:  Head: Normocephalic and atraumatic.  Eyes: Right eye exhibits no discharge. Left eye exhibits no discharge.  Neck: Neck supple.  Cardiovascular: Normal rate and regular rhythm.   Pulmonary/Chest: Effort normal and breath sounds normal. No respiratory distress.  Abdominal: Soft. Bowel sounds are normal. She exhibits no distension.  Musculoskeletal: She exhibits edema and tenderness.  Right anterior tibial plateau mildly swollen and tender to the touch, slight erythema. 2 cm  circular lesion   Skin: She is not diaphoretic.    BP 132/82 mmHg  Temp(Src) 97.9 F (36.6 C) (Oral)  Ht 5\' 3"  (1.6 m)  Wt 164 lb (74.39 kg)  BMI 29.06 kg/m2 Wt Readings from Last 3 Encounters:  02/17/15 164 lb (74.39 kg)  01/23/15 164 lb (74.39 kg)  01/17/15 163 lb (73.936 kg)     Lab Results  Component Value Date   WBC 5.4 01/01/2015   HGB 12.1 01/01/2015   HCT 36.4 01/01/2015   PLT 396.0 01/01/2015   GLUCOSE 110* 01/01/2015   CHOL 119 01/01/2015   TRIG 58.0 01/01/2015   HDL 52.80 01/01/2015   LDLCALC 55 01/01/2015   ALT 16 06/18/2013   AST 19 06/18/2013   NA 143 01/01/2015   K 3.5 01/01/2015   CL 106 01/01/2015   CREATININE 0.66 01/01/2015   BUN 6 01/01/2015   CO2 28 01/01/2015   TSH 1.09 01/01/2015   HGBA1C 5.6 01/01/2015   MICROALBUR 53.5* 01/01/2015    Lab Results  Component Value Date   TSH 1.09 01/01/2015   Lab Results  Component Value Date   WBC 5.4 01/01/2015   HGB 12.1 01/01/2015   HCT 36.4 01/01/2015   MCV 88.0 01/01/2015   PLT 396.0 01/01/2015   Lab Results  Component Value Date   NA 143 01/01/2015   K 3.5 01/01/2015   CO2 28 01/01/2015   GLUCOSE 110* 01/01/2015   BUN 6 01/01/2015   CREATININE 0.66 01/01/2015   BILITOT 0.4 06/18/2013   ALKPHOS 55 06/18/2013   AST 19 06/18/2013   ALT 16 06/18/2013   PROT 7.4 06/18/2013   ALBUMIN 3.7 06/18/2013   CALCIUM 10.0 01/01/2015   GFR 113.37 01/01/2015   Lab Results  Component Value Date   CHOL 119 01/01/2015   Lab Results  Component Value Date   HDL 52.80 01/01/2015   Lab Results  Component Value Date   LDLCALC 55 01/01/2015   Lab Results  Component Value Date   TRIG 58.0 01/01/2015   Lab Results  Component Value Date   CHOLHDL 2 01/01/2015   Lab Results  Component Value Date   HGBA1C 5.6 01/01/2015       Assessment & Plan:   Problem List Items Addressed This Visit    Cellulitis    Started on Keflex, try icing and topical aspercreme for pain relief.      Benign  essential HTN - Primary    Well controlled, no changes to meds. Encouraged heart healthy diet such as the DASH diet and exercise as tolerated.          I am having [name] start on cephALEXin. I am also having her maintain her metFORMIN, dicyclomine, Vitamin D-3, losartan, esomeprazole, amLODipine, simvastatin,  cloNIDine, and carvedilol.  Meds ordered this encounter  Medications  . cephALEXin (KEFLEX) 500 MG capsule    Sig: Take 1 capsule (500 mg total) by mouth 3 (three) times daily.    Dispense:  21 capsule    Refill:  0     Danise Edge, MD

## 2015-02-18 NOTE — Assessment & Plan Note (Signed)
Started on Keflex, try icing and topical aspercreme for pain relief.

## 2015-02-18 NOTE — Assessment & Plan Note (Signed)
Well controlled, no changes to meds. Encouraged heart healthy diet such as the DASH diet and exercise as tolerated.  °

## 2015-02-19 ENCOUNTER — Ambulatory Visit: Payer: Medicare HMO | Admitting: Family

## 2015-02-22 ENCOUNTER — Ambulatory Visit (INDEPENDENT_AMBULATORY_CARE_PROVIDER_SITE_OTHER): Payer: Medicare HMO | Admitting: Family

## 2015-02-22 ENCOUNTER — Telehealth: Payer: Self-pay | Admitting: Family

## 2015-02-22 ENCOUNTER — Encounter: Payer: Self-pay | Admitting: Family

## 2015-02-22 ENCOUNTER — Other Ambulatory Visit (INDEPENDENT_AMBULATORY_CARE_PROVIDER_SITE_OTHER): Payer: Medicare HMO

## 2015-02-22 VITALS — BP 144/84 | HR 79 | Temp 97.7°F | Resp 18 | Ht 63.0 in | Wt 164.0 lb

## 2015-02-22 DIAGNOSIS — R52 Pain, unspecified: Secondary | ICD-10-CM | POA: Diagnosis not present

## 2015-02-22 LAB — CBC
HEMATOCRIT: 38.9 % (ref 36.0–46.0)
HEMOGLOBIN: 12.6 g/dL (ref 12.0–15.0)
MCHC: 32.3 g/dL (ref 30.0–36.0)
MCV: 88.5 fl (ref 78.0–100.0)
Platelets: 359 10*3/uL (ref 150.0–400.0)
RBC: 4.4 Mil/uL (ref 3.87–5.11)
RDW: 14.4 % (ref 11.5–15.5)
WBC: 7.8 10*3/uL (ref 4.0–10.5)

## 2015-02-22 MED ORDER — NAPROXEN 500 MG PO TABS: 500.0000 mg | ORAL_TABLET | Freq: Two times a day (BID) | ORAL | Status: DC

## 2015-02-22 NOTE — Patient Instructions (Signed)
Thank you for choosing [facility].  Summary/Instructions:  Your prescription(s) have been submitted to your pharmacy or been printed and provided for you. Please take as directed and contact our office if you believe you are having problem(s) with the medication(s) or have any questions.  Please stop by the lab on the basement level of the building for your blood work. Your results will be released to MyChart (or called to you) after review, usually within 72 hours after test completion. If any changes need to be made, you will be notified at that same time.  If your symptoms worsen or fail to improve, please contact our office for further instruction, or in case of emergency go directly to the emergency room at the closest medical facility.   Costochondritis Costochondritis, sometimes called Tietze syndrome, is a swelling and irritation (inflammation) of the tissue (cartilage) that connects your ribs with your breastbone (sternum). It causes pain in the chest and rib area. Costochondritis usually goes away on its own over time. It can take up to 6 weeks or longer to get better, especially if you are unable to limit your activities. CAUSES  Some cases of costochondritis have no known cause. Possible causes include:  Injury (trauma).  Exercise or activity such as lifting.  Severe coughing. SIGNS AND SYMPTOMS  Pain and tenderness in the chest and rib area.  Pain that gets worse when coughing or taking deep breaths.  Pain that gets worse with specific movements. DIAGNOSIS  Your health care provider will do a physical exam and ask about your symptoms. Chest X-rays or other tests may be done to rule out other problems. TREATMENT  Costochondritis usually goes away on its own over time. Your health care provider may prescribe medicine to help relieve pain. HOME CARE INSTRUCTIONS   Avoid exhausting physical activity. Try not to strain your ribs during normal activity. This would  include any activities using chest, abdominal, and side muscles, especially if heavy weights are used.  Apply ice to the affected area for the first 2 days after the pain begins.  Put ice in a plastic bag.  Place a towel between your skin and the bag.  Leave the ice on for 20 minutes, 2-3 times a day.  Only take over-the-counter or prescription medicines as directed by your health care provider. SEEK MEDICAL CARE IF:  You have redness or swelling at the rib joints. These are signs of infection.  Your pain does not go away despite rest or medicine. SEEK IMMEDIATE MEDICAL CARE IF:   Your pain increases or you are very uncomfortable.  You have shortness of breath or difficulty breathing.  You cough up blood.  You have worse chest pains, sweating, or vomiting.  You have a fever or persistent symptoms for more than 2-3 days.  You have a fever and your symptoms suddenly get worse. MAKE SURE YOU:   Understand these instructions.  Will watch your condition.  Will get help right away if you are not doing well or get worse. Document Released: 03/12/2005 Document Revised: 03/23/2013 Document Reviewed: 01/04/2013 [facility] Patient Information 2015 [place], [place]. This information is not intended to replace advice given to you by your health care provider. Make sure you discuss any questions you have with your health care provider.

## 2015-02-22 NOTE — Telephone Encounter (Signed)
Please inform patient that her blood work from today was normal with no evidence of infection or bleeding.

## 2015-02-22 NOTE — Telephone Encounter (Signed)
Pt aware of results 

## 2015-02-22 NOTE — Progress Notes (Signed)
Subjective:    Patient ID: Meredith Phillips, female    DOB: March 15, 1946, 69 y.o.   MRN: [id number]  Chief Complaint  Patient presents with  . Follow-up    having pain in left side at rib cage, shooting, stabbing pain, started yesterday says when she started getting up out of bed she felt like a weight was holding her down    HPI:  Meredith Phillips is a 69 y.o. female with a PMH of arthritis, diabetes, GERD, hyperlipidemia, hypertension, appendectomy, tonsillectomy, and hernia repair who presents today for an acute office visit.  This is a new problem. Associated symptom of pain located in her left upper abdomen that radiates to her back has been going on for about 1 day. Denies any trauma to the area. Pain is described as sharp that comes and goes but there is an underlying continuous pain. Severity of the pain is 7/10. Modifying factors include antacids which questionably helped. Denies chest pain or shortness of breath. Does have increased pain with laughing and has had cough that started on Tuesday. She also notes that she did have chest pressure yesterday that is described as having a weight on her chest.  Allergies  Allergen Reactions  . Codeine   . Other     Doesn't like to take pain medication due to hallucinations.  [name] Shellfish Allergy     Current Outpatient Prescriptions on File Prior to Visit  Medication Sig Dispense Refill  . amLODipine (NORVASC) 10 MG tablet Take 10 mg by mouth daily.    . carvedilol (COREG) 3.125 MG tablet Take 3.125 mg by mouth 2 (two) times daily with a meal.    . cephALEXin (KEFLEX) 500 MG capsule Take 1 capsule (500 mg total) by mouth 3 (three) times daily. 21 capsule 0  . Cholecalciferol (VITAMIN D-3) 1000 UNITS CAPS Take 1,000 Units by mouth.    . cloNIDine (CATAPRES) 0.1 MG tablet Take 0.1 mg by mouth 2 (two) times daily.    [name] dicyclomine (BENTYL) 10 MG capsule Take 10 mg by mouth as needed for spasms.    [name] esomeprazole (NEXIUM) 40 MG capsule Take 40  mg by mouth daily at 12 noon.    [name] losartan (COZAAR) 100 MG tablet Take 100 mg by mouth daily.    . metFORMIN (GLUCOPHAGE) 500 MG tablet Take 500 mg by mouth daily.    . simvastatin (ZOCOR) 20 MG tablet Take 20 mg by mouth daily.     No current facility-administered medications on file prior to visit.     Past Medical History  Diagnosis Date  . Diabetes mellitus without complication   . GERD (gastroesophageal reflux disease)   . Hypercholesteremia   . Hypertension   . Diabetic peripheral neuropathy   . Arthritis   . Liver disease   . Polyp of colon   . Chicken pox   . Benign essential HTN 02/18/2015  . Cellulitis 02/18/2015    Review of Systems  Constitutional: Negative for fever and chills.  Respiratory: Negative for shortness of breath.   Cardiovascular: Negative for chest pain, palpitations and leg swelling.  Gastrointestinal: Positive for abdominal pain. Negative for nausea, vomiting, diarrhea and constipation.      Objective:    BP 144/84 mmHg  Pulse 79  Temp(Src) 97.7 F (36.5 C) (Oral)  Resp 18  Ht 5\' 3"  (1.6 m)  Wt 164 lb (74.39 kg)  BMI 29.06 kg/m2  SpO2 98% Nursing note and vital signs reviewed.  Physical  Exam  Constitutional: She is oriented to person, place, and time. She appears well-developed and well-nourished. No distress.  Cardiovascular: Normal rate, regular rhythm, normal heart sounds and intact distal pulses.   Pulmonary/Chest: Effort normal and breath sounds normal.  Abdominal: Normal appearance and bowel sounds are normal. She exhibits no mass. There is no hepatosplenomegaly. There is tenderness in the left upper quadrant. There is no rigidity, no rebound, no guarding, no tenderness at McBurney's point and negative Murphy's sign.  Tenderness of left upper quadrant and lateral along left ribs with no deformity, discoloration or edema. Mild discomfort elicited with rib compression.   Neurological: She is alert and oriented to person, place, and time.    Skin: Skin is warm and dry.  Psychiatric: She has a normal mood and affect. Her behavior is normal. Judgment and thought content normal.       Assessment & Plan:   Problem List Items Addressed This Visit      Other   Pain of left side of body - Primary    Symptoms consistent with costochondritis given pain with deep breaths, coughing, and sneezing. Unlikely cardiac origin of squeezing chest pain. Obtain CBC to rule out infection and ultrasound to rule out underlying pathology. Start naproxen. Conservative treatment with splinting as needed. Follow up if symptoms worsen or cancel ultrasound if symptoms improve.       Relevant Medications   naproxen (NAPROSYN) 500 MG tablet   Other Relevant Orders   CBC (Completed)   US Abdomen Complete

## 2015-02-22 NOTE — Progress Notes (Signed)
Pre visit review using our clinic review tool, if applicable. No additional management support is needed unless otherwise documented below in the visit note. 

## 2015-02-22 NOTE — Assessment & Plan Note (Signed)
Symptoms consistent with costochondritis given pain with deep breaths, coughing, and sneezing. Unlikely cardiac origin of squeezing chest pain. Obtain CBC to rule out infection and ultrasound to rule out underlying pathology. Start naproxen. Conservative treatment with splinting as needed. Follow up if symptoms worsen or cancel ultrasound if symptoms improve.

## 2015-03-02 ENCOUNTER — Telehealth: Payer: Self-pay | Admitting: Family

## 2015-03-02 MED ORDER — GABAPENTIN 300 MG PO CAPS: 300.0000 mg | ORAL_CAPSULE | Freq: Three times a day (TID) | ORAL | Status: DC

## 2015-03-02 NOTE — Telephone Encounter (Signed)
Patient is requesting that a new script be filled. It is not in the med list currently.   Gabapentin 300 MG/ 1 tab 3 times per day   Using [facility] on [address] in [facility]

## 2015-03-02 NOTE — Telephone Encounter (Signed)
Medication filled.  

## 2015-03-14 ENCOUNTER — Other Ambulatory Visit: Payer: Self-pay | Admitting: Family

## 2015-03-14 ENCOUNTER — Telehealth: Payer: Self-pay | Admitting: *Deleted

## 2015-03-14 NOTE — Telephone Encounter (Signed)
Receive call pt states that she had change md, and she need refill on her glucophage xr. Pharmacy suppose to be send request. Inform pt Tammy Sours have receive request, and refills has been sent back to [facility].../lmb

## 2015-03-21 ENCOUNTER — Other Ambulatory Visit: Payer: Self-pay | Admitting: Family

## 2015-04-27 ENCOUNTER — Encounter: Payer: Self-pay | Admitting: Family

## 2015-04-27 ENCOUNTER — Ambulatory Visit (INDEPENDENT_AMBULATORY_CARE_PROVIDER_SITE_OTHER): Payer: Commercial Managed Care - HMO | Admitting: Family

## 2015-04-27 VITALS — BP 138/88 | HR 80 | Temp 97.8°F | Resp 18 | Ht 63.0 in | Wt 170.0 lb

## 2015-04-27 DIAGNOSIS — M5431 Sciatica, right side: Secondary | ICD-10-CM | POA: Diagnosis not present

## 2015-04-27 DIAGNOSIS — Z23 Encounter for immunization: Secondary | ICD-10-CM

## 2015-04-27 NOTE — Progress Notes (Signed)
Subjective:    Patient ID: Meredith Phillips, female    DOB: [birth year], 69 y.o.   MRN: [id number]  Chief Complaint  Patient presents with  . Hip Pain    Right side pain, has tightness and hardness in her right cheek and it goes down right thigh,    HPI:  Meredith Phillips is a 69 y.o. female who  has a past medical history of Diabetes mellitus without complication (HCC); GERD (gastroesophageal reflux disease); Hypercholesteremia; Hypertension; Diabetic peripheral neuropathy (HCC); Arthritis; Liver disease; Polyp of colon; Chicken pox; Benign essential HTN (02/18/2015); and Cellulitis (02/18/2015). and presents today for a follow up office visit.   Associated symptom of pain located in her right thigh and right gluteal which has been going on for a while. Notes that it is slowly getting worse. Pain is described as sharp and constant. Reports that she is having muscle spasms in her left thigh. Does have back pain. Aggrevating factors include sitting down for long periods of time. Modifying factors include heating pads which did provide some relief. Denies any trauma to her leg or back and states she has not fallen. She is currently being followed by [facility] and receiving cortisone injections with the most recent one a couple of weeks ago.    Allergies  Allergen Reactions  . Codeine   . Other     Doesn't like to take pain medication due to hallucinations.  [name] Shellfish Allergy      Current Outpatient Prescriptions on File Prior to Visit  Medication Sig Dispense Refill  . amLODipine (NORVASC) 10 MG tablet Take 10 mg by mouth daily.    . carvedilol (COREG) 3.125 MG tablet Take 3.125 mg by mouth 2 (two) times daily with a meal.    . Cholecalciferol (VITAMIN D-3) 1000 UNITS CAPS Take 1,000 Units by mouth.    . cloNIDine (CATAPRES) 0.1 MG tablet Take 0.1 mg by mouth 2 (two) times daily.    [name] dicyclomine (BENTYL) 10 MG capsule Take 10 mg by mouth as needed for spasms.    [name]  esomeprazole (NEXIUM) 40 MG capsule Take 40 mg by mouth daily at 12 noon.    . gabapentin (NEURONTIN) 300 MG capsule Take 1 capsule (300 mg total) by mouth 3 (three) times daily. 90 capsule 1  . losartan (COZAAR) 100 MG tablet Take 100 mg by mouth daily.    . metFORMIN (GLUCOPHAGE-XR) 500 MG 24 hr tablet TAKE 1 TABLET BY MOUTH TWICE A DAY 180 tablet 0  . naproxen (NAPROSYN) 500 MG tablet TAKE 1 TABLET BY MOUTH TWICE A DAY WITH A MEAL 60 tablet 1  . simvastatin (ZOCOR) 20 MG tablet Take 20 mg by mouth daily.     No current facility-administered medications on file prior to visit.    Review of Systems  Constitutional: Negative for fever and chills.  Musculoskeletal: Positive for back pain.  Neurological: Negative for weakness and numbness.      Objective:    BP 138/88 mmHg  Pulse 80  Temp(Src) 97.8 F (36.6 C) (Oral)  Resp 18  Ht 5\' 3"  (1.6 m)  Wt 170 lb (77.111 kg)  BMI 30.12 kg/m2  SpO2 98% Nursing note and vital signs reviewed.  Physical Exam  Constitutional: She is oriented to person, place, and time. She appears well-developed and well-nourished. No distress.  Cardiovascular: Normal rate, regular rhythm, normal heart sounds and intact distal pulses.   Pulmonary/Chest: Effort normal and breath sounds normal.  Musculoskeletal:  Lumbar spine - no obvious deformity, discoloration, or edema noted. Stance is slightly flexed towards the left. Tenderness elicited along the lumbar spine and right paraspinal musculature including right gluteal. Range of motion is limited in flexion and extension. Distal pulses are intact and appropriate. Faber's test is negative and straight leg is positive.  Neurological: She is alert and oriented to person, place, and time.  Skin: Skin is warm and dry.  Psychiatric: She has a normal mood and affect. Her behavior is normal. Judgment and thought content normal.       Assessment & Plan:   Problem List Items Addressed This Visit      Nervous and  Auditory   Sciatica - Primary    Symptoms and exam remain consistent with sciatica and possible disc-related pathology that has been refractory to cortisone injections. Recommend follow-up with spine scoliosis specialist. Home exercise therapy provided. Continue heat as needed. Continue current dosage of naproxen. Refer to physical therapy for further evaluation and treatment. Follow-up if symptoms worsen or fail to improve.      Relevant Orders   Ambulatory referral to Physical Therapy    Other Visit Diagnoses    Encounter for immunization

## 2015-04-27 NOTE — Patient Instructions (Addendum)
Thank you for choosing [facility].  Summary/Instructions:  If your symptoms worsen or fail to improve, please contact our office for further instruction, or in case of emergency go directly to the emergency room at the closest medical facility.    Sciatica With Rehab The sciatic nerve runs from the back down the leg and is responsible for sensation and control of the muscles in the back (posterior) side of the thigh, lower leg, and foot. Sciatica is a condition that is characterized by inflammation of this nerve.  SYMPTOMS   Signs of nerve damage, including numbness and/or weakness along the posterior side of the lower extremity.  Pain in the back of the thigh that may also travel down the leg.  Pain that worsens when sitting for long periods of time.  Occasionally, pain in the back or buttock. CAUSES  Inflammation of the sciatic nerve is the cause of sciatica. The inflammation is due to something irritating the nerve. Common sources of irritation include:  Sitting for long periods of time.  Direct trauma to the nerve.  Arthritis of the spine.  Herniated or ruptured disk.  Slipping of the vertebrae (spondylolisthesis).  Pressure from soft tissues, such as muscles or ligament-like tissue (fascia). RISK INCREASES WITH:  Sports that place pressure or stress on the spine (football or weightlifting).  Poor strength and flexibility.  Failure to warm up properly before activity.  Family history of low back pain or disk disorders.  Previous back injury or surgery.  Poor body mechanics, especially when lifting, or poor posture. PREVENTION   Warm up and stretch properly before activity.  Maintain physical fitness:  Strength, flexibility, and endurance.  Cardiovascular fitness.  Learn and use proper technique, especially with posture and lifting. When possible, have coach correct improper technique.  Avoid activities that place stress on the spine. PROGNOSIS If  treated properly, then sciatica usually resolves within 6 weeks. However, occasionally surgery is necessary.  RELATED COMPLICATIONS   Permanent nerve damage, including pain, numbness, tingle, or weakness.  Chronic back pain.  Risks of surgery: infection, bleeding, nerve damage, or damage to surrounding tissues. TREATMENT Treatment initially involves resting from any activities that aggravate your symptoms. The use of ice and medication may help reduce pain and inflammation. The use of strengthening and stretching exercises may help reduce pain with activity. These exercises may be performed at home or with referral to a therapist. A therapist may recommend further treatments, such as transcutaneous electronic nerve stimulation (TENS) or ultrasound. Your caregiver may recommend corticosteroid injections to help reduce inflammation of the sciatic nerve. If symptoms persist despite non-surgical (conservative) treatment, then surgery may be recommended. MEDICATION  If pain medication is necessary, then nonsteroidal anti-inflammatory medications, such as aspirin and ibuprofen, or other minor pain relievers, such as acetaminophen, are often recommended.  Do not take pain medication for 7 days before surgery.  Prescription pain relievers may be given if deemed necessary by your caregiver. Use only as directed and only as much as you need.  Ointments applied to the skin may be helpful.  Corticosteroid injections may be given by your caregiver. These injections should be reserved for the most serious cases, because they may only be given a certain number of times. HEAT AND COLD  Cold treatment (icing) relieves pain and reduces inflammation. Cold treatment should be applied for 10 to 15 minutes every 2 to 3 hours for inflammation and pain and immediately after any activity that aggravates your symptoms. Use ice packs or massage  the area with a piece of ice (ice massage).  Heat treatment may be used  prior to performing the stretching and strengthening activities prescribed by your caregiver, physical therapist, or athletic trainer. Use a heat pack or soak the injury in warm water. SEEK MEDICAL CARE IF:  Treatment seems to offer no benefit, or the condition worsens.  Any medications produce adverse side effects. EXERCISES  RANGE OF MOTION (ROM) AND STRETCHING EXERCISES - Sciatica Most people with sciatic will find that their symptoms worsen with either excessive bending forward (flexion) or arching at the low back (extension). The exercises which will help resolve your symptoms will focus on the opposite motion. Your physician, physical therapist or athletic trainer will help you determine which exercises will be most helpful to resolve your low back pain. Do not complete any exercises without first consulting with your clinician. Discontinue any exercises which worsen your symptoms until you speak to your clinician. If you have pain, numbness or tingling which travels down into your buttocks, leg or foot, the goal of the therapy is for these symptoms to move closer to your back and eventually resolve. Occasionally, these leg symptoms will get better, but your low back pain may worsen; this is typically an indication of progress in your rehabilitation. Be certain to be very alert to any changes in your symptoms and the activities in which you participated in the 24 hours prior to the change. Sharing this information with your clinician will allow him/her to most efficiently treat your condition. These exercises may help you when beginning to rehabilitate your injury. Your symptoms may resolve with or without further involvement from your physician, physical therapist or athletic trainer. While completing these exercises, remember:   Restoring tissue flexibility helps normal motion to return to the joints. This allows healthier, less painful movement and activity.  An effective stretch should be held  for at least 30 seconds.  A stretch should never be painful. You should only feel a gentle lengthening or release in the stretched tissue. FLEXION RANGE OF MOTION AND STRETCHING EXERCISES: STRETCH - Flexion, Single Knee to Chest   Lie on a firm bed or floor with both legs extended in front of you.  Keeping one leg in contact with the floor, bring your opposite knee to your chest. Hold your leg in place by either grabbing behind your thigh or at your knee.  Pull until you feel a gentle stretch in your low back. Hold __________ seconds.  Slowly release your grasp and repeat the exercise with the opposite side. Repeat __________ times. Complete this exercise __________ times per day.  STRETCH - Flexion, Double Knee to Chest  Lie on a firm bed or floor with both legs extended in front of you.  Keeping one leg in contact with the floor, bring your opposite knee to your chest.  Tense your stomach muscles to support your back and then lift your other knee to your chest. Hold your legs in place by either grabbing behind your thighs or at your knees.  Pull both knees toward your chest until you feel a gentle stretch in your low back. Hold __________ seconds.  Tense your stomach muscles and slowly return one leg at a time to the floor. Repeat __________ times. Complete this exercise __________ times per day.  STRETCH - Low Trunk Rotation   Lie on a firm bed or floor. Keeping your legs in front of you, bend your knees so they are both pointed toward the ceiling  and your feet are flat on the floor.  Extend your arms out to the side. This will stabilize your upper body by keeping your shoulders in contact with the floor.  Gently and slowly drop both knees together to one side until you feel a gentle stretch in your low back. Hold for __________ seconds.  Tense your stomach muscles to support your low back as you bring your knees back to the starting position. Repeat the exercise to the other  side. Repeat __________ times. Complete this exercise __________ times per day  EXTENSION RANGE OF MOTION AND FLEXIBILITY EXERCISES: STRETCH - Extension, Prone on Elbows  Lie on your stomach on the floor, a bed will be too soft. Place your palms about shoulder width apart and at the height of your head.  Place your elbows under your shoulders. If this is too painful, stack pillows under your chest.  Allow your body to relax so that your hips drop lower and make contact more completely with the floor.  Hold this position for __________ seconds.  Slowly return to lying flat on the floor. Repeat __________ times. Complete this exercise __________ times per day.  RANGE OF MOTION - Extension, Prone Press Ups  Lie on your stomach on the floor, a bed will be too soft. Place your palms about shoulder width apart and at the height of your head.  Keeping your back as relaxed as possible, slowly straighten your elbows while keeping your hips on the floor. You may adjust the placement of your hands to maximize your comfort. As you gain motion, your hands will come more underneath your shoulders.  Hold this position __________ seconds.  Slowly return to lying flat on the floor. Repeat __________ times. Complete this exercise __________ times per day.  STRENGTHENING EXERCISES - Sciatica  These exercises may help you when beginning to rehabilitate your injury. These exercises should be done near your "sweet spot." This is the neutral, low-back arch, somewhere between fully rounded and fully arched, that is your least painful position. When performed in this safe range of motion, these exercises can be used for people who have either a flexion or extension based injury. These exercises may resolve your symptoms with or without further involvement from your physician, physical therapist or athletic trainer. While completing these exercises, remember:   Muscles can gain both the endurance and the strength  needed for everyday activities through controlled exercises.  Complete these exercises as instructed by your physician, physical therapist or athletic trainer. Progress with the resistance and repetition exercises only as your caregiver advises.  You may experience muscle soreness or fatigue, but the pain or discomfort you are trying to eliminate should never worsen during these exercises. If this pain does worsen, stop and make certain you are following the directions exactly. If the pain is still present after adjustments, discontinue the exercise until you can discuss the trouble with your clinician. STRENGTHENING - Deep Abdominals, Pelvic Tilt   Lie on a firm bed or floor. Keeping your legs in front of you, bend your knees so they are both pointed toward the ceiling and your feet are flat on the floor.  Tense your lower abdominal muscles to press your low back into the floor. This motion will rotate your pelvis so that your tail bone is scooping upwards rather than pointing at your feet or into the floor.  With a gentle tension and even breathing, hold this position for __________ seconds. Repeat __________ times. Complete this exercise  __________ times per day.  STRENGTHENING - Abdominals, Crunches   Lie on a firm bed or floor. Keeping your legs in front of you, bend your knees so they are both pointed toward the ceiling and your feet are flat on the floor. Cross your arms over your chest.  Slightly tip your chin down without bending your neck.  Tense your abdominals and slowly lift your trunk high enough to just clear your shoulder blades. Lifting higher can put excessive stress on the low back and does not further strengthen your abdominal muscles.  Control your return to the starting position. Repeat __________ times. Complete this exercise __________ times per day.  STRENGTHENING - Quadruped, Opposite UE/LE Lift  Assume a hands and knees position on a firm surface. Keep your hands  under your shoulders and your knees under your hips. You may place padding under your knees for comfort.  Find your neutral spine and gently tense your abdominal muscles so that you can maintain this position. Your shoulders and hips should form a rectangle that is parallel with the floor and is not twisted.  Keeping your trunk steady, lift your right hand no higher than your shoulder and then your left leg no higher than your hip. Make sure you are not holding your breath. Hold this position __________ seconds.  Continuing to keep your abdominal muscles tense and your back steady, slowly return to your starting position. Repeat with the opposite arm and leg. Repeat __________ times. Complete this exercise __________ times per day.  STRENGTHENING - Abdominals and Quadriceps, Straight Leg Raise   Lie on a firm bed or floor with both legs extended in front of you.  Keeping one leg in contact with the floor, bend the other knee so that your foot can rest flat on the floor.  Find your neutral spine, and tense your abdominal muscles to maintain your spinal position throughout the exercise.  Slowly lift your straight leg off the floor about 6 inches for a count of 15, making sure to not hold your breath.  Still keeping your neutral spine, slowly lower your leg all the way to the floor. Repeat this exercise with each leg __________ times. Complete this exercise __________ times per day. POSTURE AND BODY MECHANICS CONSIDERATIONS - Sciatica Keeping correct posture when sitting, standing or completing your activities will reduce the stress put on different body tissues, allowing injured tissues a chance to heal and limiting painful experiences. The following are general guidelines for improved posture. Your physician or physical therapist will provide you with any instructions specific to your needs. While reading these guidelines, remember:  The exercises prescribed by your provider will help you have  the flexibility and strength to maintain correct postures.  The correct posture provides the optimal environment for your joints to work. All of your joints have less wear and tear when properly supported by a spine with good posture. This means you will experience a healthier, less painful body.  Correct posture must be practiced with all of your activities, especially prolonged sitting and standing. Correct posture is as important when doing repetitive low-stress activities (typing) as it is when doing a single heavy-load activity (lifting). RESTING POSITIONS Consider which positions are most painful for you when choosing a resting position. If you have pain with flexion-based activities (sitting, bending, stooping, squatting), choose a position that allows you to rest in a less flexed posture. You would want to avoid curling into a fetal position on your side. If your  pain worsens with extension-based activities (prolonged standing, working overhead), avoid resting in an extended position such as sleeping on your stomach. Most people will find more comfort when they rest with their spine in a more neutral position, neither too rounded nor too arched. Lying on a non-sagging bed on your side with a pillow between your knees, or on your back with a pillow under your knees will often provide some relief. Keep in mind, being in any one position for a prolonged period of time, no matter how correct your posture, can still lead to stiffness. PROPER SITTING POSTURE In order to minimize stress and discomfort on your spine, you must sit with correct posture Sitting with good posture should be effortless for a healthy body. Returning to good posture is a gradual process. Many people can work toward this most comfortably by using various supports until they have the flexibility and strength to maintain this posture on their own. When sitting with proper posture, your ears will fall over your shoulders and your  shoulders will fall over your hips. You should use the back of the chair to support your upper back. Your low back will be in a neutral position, just slightly arched. You may place a small pillow or folded towel at the base of your low back for support.  When working at a desk, create an environment that supports good, upright posture. Without extra support, muscles fatigue and lead to excessive strain on joints and other tissues. Keep these recommendations in mind: CHAIR:   A chair should be able to slide under your desk when your back makes contact with the back of the chair. This allows you to work closely.  The chair's height should allow your eyes to be level with the upper part of your monitor and your hands to be slightly lower than your elbows. BODY POSITION  Your feet should make contact with the floor. If this is not possible, use a foot rest.  Keep your ears over your shoulders. This will reduce stress on your neck and low back. INCORRECT SITTING POSTURES   If you are feeling tired and unable to assume a healthy sitting posture, do not slouch or slump. This puts excessive strain on your back tissues, causing more damage and pain. Healthier options include:  Using more support, like a lumbar pillow.  Switching tasks to something that requires you to be upright or walking.  Talking a brief walk.  Lying down to rest in a neutral-spine position. PROLONGED STANDING WHILE SLIGHTLY LEANING FORWARD  When completing a task that requires you to lean forward while standing in one place for a long time, place either foot up on a stationary 2-4 inch high object to help maintain the best posture. When both feet are on the ground, the low back tends to lose its slight inward curve. If this curve flattens (or becomes too large), then the back and your other joints will experience too much stress, fatigue more quickly and can cause pain.  CORRECT STANDING POSTURES Proper standing posture should  be assumed with all daily activities, even if they only take a few moments, like when brushing your teeth. As in sitting, your ears should fall over your shoulders and your shoulders should fall over your hips. You should keep a slight tension in your abdominal muscles to brace your spine. Your tailbone should point down to the ground, not behind your body, resulting in an over-extended swayback posture.  INCORRECT STANDING POSTURES  Common  incorrect standing postures include a forward head, locked knees and/or an excessive swayback. WALKING Walk with an upright posture. Your ears, shoulders and hips should all line-up. PROLONGED ACTIVITY IN A FLEXED POSITION When completing a task that requires you to bend forward at your waist or lean over a low surface, try to find a way to stabilize 3 of 4 of your limbs. You can place a hand or elbow on your thigh or rest a knee on the surface you are reaching across. This will provide you more stability so that your muscles do not fatigue as quickly. By keeping your knees relaxed, or slightly bent, you will also reduce stress across your low back. CORRECT LIFTING TECHNIQUES DO :   Assume a wide stance. This will provide you more stability and the opportunity to get as close as possible to the object which you are lifting.  Tense your abdominals to brace your spine; then bend at the knees and hips. Keeping your back locked in a neutral-spine position, lift using your leg muscles. Lift with your legs, keeping your back straight.  Test the weight of unknown objects before attempting to lift them.  Try to keep your elbows locked down at your sides in order get the best strength from your shoulders when carrying an object.  Always ask for help when lifting heavy or awkward objects. INCORRECT LIFTING TECHNIQUES DO NOT:   Lock your knees when lifting, even if it is a small object.  Bend and twist. Pivot at your feet or move your feet when needing to change  directions.  Assume that you cannot safely pick up a paperclip without proper posture.   This information is not intended to replace advice given to you by your health care provider. Make sure you discuss any questions you have with your health care provider.   Document Released: 06/02/2005 Document Revised: 10/17/2014 Document Reviewed: 09/14/2008 [employer] Interactive Patient Education [employer]

## 2015-04-27 NOTE — Assessment & Plan Note (Signed)
Symptoms and exam remain consistent with sciatica and possible disc-related pathology that has been refractory to cortisone injections. Recommend follow-up with spine scoliosis specialist. Home exercise therapy provided. Continue heat as needed. Continue current dosage of naproxen. Refer to physical therapy for further evaluation and treatment. Follow-up if symptoms worsen or fail to improve.

## 2015-04-27 NOTE — Progress Notes (Signed)
Pre visit review using our clinic review tool, if applicable. No additional management support is needed unless otherwise documented below in the visit note. 

## 2015-04-28 ENCOUNTER — Other Ambulatory Visit: Payer: Self-pay | Admitting: Family

## 2015-05-09 ENCOUNTER — Ambulatory Visit: Payer: Commercial Managed Care - HMO | Admitting: Rehabilitative and Restorative Service Providers"

## 2015-05-16 ENCOUNTER — Ambulatory Visit (INDEPENDENT_AMBULATORY_CARE_PROVIDER_SITE_OTHER): Payer: Commercial Managed Care - HMO | Admitting: Rehabilitative and Restorative Service Providers"

## 2015-05-16 ENCOUNTER — Encounter: Payer: Self-pay | Admitting: Rehabilitative and Restorative Service Providers"

## 2015-05-16 DIAGNOSIS — R293 Abnormal posture: Secondary | ICD-10-CM

## 2015-05-16 DIAGNOSIS — M79604 Pain in right leg: Secondary | ICD-10-CM | POA: Diagnosis not present

## 2015-05-16 DIAGNOSIS — Z7409 Other reduced mobility: Secondary | ICD-10-CM

## 2015-05-16 DIAGNOSIS — M623 Immobility syndrome (paraplegic): Secondary | ICD-10-CM

## 2015-05-16 DIAGNOSIS — R531 Weakness: Secondary | ICD-10-CM

## 2015-05-16 DIAGNOSIS — R6889 Other general symptoms and signs: Secondary | ICD-10-CM

## 2015-05-16 DIAGNOSIS — M2560 Stiffness of unspecified joint, not elsewhere classified: Secondary | ICD-10-CM

## 2015-05-16 NOTE — Therapy (Addendum)
[place], [place], [address] Phone: [phone]   Fax:  [phone]  Physical Therapy Evaluation  Patient Details  Name: Meredith Phillips MRN: [id number] Date of Birth: [birth year] Referring Provider: Dr. Precious Haws  Encounter Date: 05/16/2015      PT End of Session - 05/16/15 0940    Visit Number 1   Number of Visits 12   Date for PT Re-Evaluation 06/27/15   PT Start Time 0940   PT Stop Time 1034   PT Time Calculation (min) 54 min   Activity Tolerance Patient tolerated treatment well      Past Medical History  Diagnosis Date  . Diabetes mellitus without complication ([place])   . GERD (gastroesophageal reflux disease)   . Hypercholesteremia   . Hypertension   . Diabetic peripheral neuropathy ([place])   . Arthritis   . Liver disease   . Polyp of colon   . Chicken pox   . Benign essential HTN 02/18/2015  . Cellulitis 02/18/2015    Past Surgical History  Procedure Laterality Date  . Hernia repair    . Rotator cuff repair    . Knee surgery    . Appendectomy    . Tonsillectomy      There were no vitals filed for this visit.  Visit Diagnosis:  Pain of right lower extremity - Plan: PT plan of care cert/re-cert  Stiffness due to immobility - Plan: PT plan of care cert/re-cert  Abnormal posture - Plan: PT plan of care cert/re-cert  Decreased strength, endurance, and mobility - Plan: PT plan of care cert/re-cert      Subjective Assessment - 05/16/15 0942    Subjective Patient reports that she has had pain in the Rt hip in the past 6 months. She was seen by MD and received an injection with some help but she has continued pain in the Rt anterior thigh and knee and hip.    Pertinent History back pain ~ 2 years ago which resolved with treatment.    How long can you sit comfortably? 10-15 min sits with hip shifted sitting on the Lt hip    How long can you stand comfortably? 5 min    How  long can you walk comfortably? 5 min    Diagnostic tests xrays   Patient Stated Goals get rid of pain    Currently in Pain? Yes   Pain Score 8    Pain Location Leg   Pain Orientation Right   Pain Descriptors / Indicators Throbbing   Pain Type Chronic pain   Pain Radiating Towards knee anterior thigh to hip and back up to waist    Pain Onset More than a month ago   Pain Frequency Constant   Aggravating Factors  sitting; stasnding; walking; any activity    Pain Relieving Factors injection helped some for a day or two            [facility] PT Assessment - 05/16/15 0001    Assessment   Medical Diagnosis Rt sciatica   Referring Provider Dr. Precious Haws   Onset Date/Surgical Date 11/30/14   Hand Dominance Right   Next MD Visit 12/16   Prior Therapy none   Precautions   Precautions None   Balance Screen   Has the patient fallen in the past 6 months No   Has the patient had a decrease in activity level because of a fear of falling?  No   Is the  patient reluctant to leave their home because of a fear of falling?  No   Home Environment   Additional Comments 2 steps into home with rail one level home    Prior Function   Level of Independence Independent   Vocation Retired   [employer] sewing   Leisure household chores; cooking; Loss adjuster, chartered; flowers   Observation/Other Assessments   Focus on Therapeutic Outcomes (FOTO)  79% limitation   Sensation   Additional Comments WFL's per pt report    Posture/Postural Control   Posture Comments trunk laterally flexed to Lt and forward bent; decresaed weight Rt LE   AROM   Lumbar Flexion 75%   Lumbar Extension 5%   Lumbar - Right Side Bend 45%   Lumbar - Left Side Bend 80%   Lumbar - Right Rotation 15%   Lumbar - Left Rotation 20%   Strength   Overall Strength Comments unable to assess strength due to pain/ functional for position changes and gait   Flexibility   Hamstrings Rt 75deg; Lt 82 deg   Quadriceps Lt heel ~10 in  from buttocks; Rt 6 in    ITB tight bilat   Piriformis tight bilat   Palpation   Spinal mobility pain with CPA mobs L3/4; L4/5; L5/S1    SI assessment  pain with spring testing    Palpation comment significant tightness through hip flexors/piriformis/hip abductors/ IT band/ quads Rt > Lt    Ambulation/Gait   Gait Comments ambulates with fwd flexed posture Lt lateral flexed and rotated Lt; decreased wt bearing on Rt LE; carrying cane rather than using cane                    [facility] Adult PT Treatment/Exercise - 05/16/15 0001    Therapeutic Activites    Therapeutic Activities --  myofacial ball release work standing    Lumbar Exercises: Standing   Other Standing Lumbar Exercises work on standing with improved posture and alignment and more ezual wt bearing    Lumbar Exercises: Prone   Other Prone Lumbar Exercises lying prone for manual work    Other Prone Lumbar Exercises glut sets 10 sec x 10   Moist Heat Therapy   Number Minutes Moist Heat 15 Minutes   Moist Heat Location Lumbar Spine;Hip   Electrical Stimulation   Electrical Stimulation Location Rt L-spine Rt hip    Electrical Stimulation Action IFC   Electrical Stimulation Parameters to tolerance   Electrical Stimulation Goals Pain   Manual Therapy   Soft tissue mobilization working through Rt hip with pt prone - superficial to deep tissue release work                 PT Education - 05/16/15 1229    Education provided Yes   Education Details positioning; myofacial ball release work; Production manager) Educated Patient   Methods Explanation;Demonstration;Tactile cues;Verbal cues   Comprehension Verbalized understanding;Returned demonstration;Verbal cues required;Tactile cues required             PT Long Term Goals - 05/16/15 1237    PT LONG TERM GOAL #1   Title Improve posture and alignment with pt to demo more upright posture in standing 06/27/15   Time 6   Period Weeks   Status New   PT LONG TERM  GOAL #2   Title Improve gait pattern with pt to demo more upright posture and safe gait pattern with or without single point cane as needed for safety  06/27/15   Time 6   Period Weeks   Status New   PT LONG TERM GOAL #3   Title Patient reports 50-79% decrease in Rt hip/LE pain 06/27/15    Time 6   Period Weeks   Status New   PT LONG TERM GOAL #4   Title Tolerate sitting for 30-45 min without shifting weight to Lt hip 06/27/15   Time 6   Period Weeks   Status New   PT LONG TERM GOAL #5   Title Tolerate standing and walking for 5-10 min without pain 06/27/15   Time 6   Period Weeks   Status New   PT LONG TERM GOAL #6   Title Improve FOTO to </= 58% limitation 06/27/15   Time 6   Period Weeks   Status New               Plan - 05/16/15 1231    Clinical Impression Statement Davy presents with chronic Rt hip and LE pain. She has abnormal posture; limited mobility; abnormal gait pattern; decreased ROM in trunk and LE's; limited functional activity level. Pt will benefit from PT to address limitations as identifited.    Pt will benefit from skilled therapeutic intervention in order to improve on the following deficits Postural dysfunction;Improper body mechanics;Abnormal gait;Difficulty walking;Decreased range of motion;Decreased mobility;Decreased endurance;Decreased activity tolerance;Pain   Rehab Potential Good   PT Frequency 2x / week   PT Duration 6 weeks   PT Treatment/Interventions Patient/family education;ADLs/Self Care Home Management;Manual techniques;Neuromuscular re-education;Therapeutic exercise;Therapeutic activities;Gait training;Moist Heat;Dry needling;Electrical Stimulation;Cryotherapy;Ultrasound   PT Next Visit Plan progress with stretching; core stabilization; postural correction; gait training; manual work; modalities   PT Home Exercise Plan myofacial ball release work; prone lying; work on Data processing manager and Agree with Plan of Care Patient          Problem List Patient Active Problem List   Diagnosis Date Noted  . Pain of left side of body 02/22/2015  . Benign essential HTN 02/18/2015  . Cellulitis 02/18/2015  . Sciatica 01/17/2015  . Routine general medical examination at a health care facility 12/26/2014  . Medicare annual wellness visit, subsequent 12/26/2014    Patricia Fargo Nilda Simmer PT, MPH 05/16/2015, 12:47 PM  [facility] [place] [address] [place], [place], [address] Phone: [phone]   Fax:  [phone]  Name: FARA WORTHY MRN: [id number] Date of Birth: 30-May-1946   PHYSICAL THERAPY DISCHARGE SUMMARY  Visits from Start of Care: 1  Current functional level related to goals / functional outcomes: unchanged   Remaining deficits: unchanged   Education / Equipment: Initial HEP Plan: Patient agrees to discharge.  Patient goals were not met. Patient is being discharged due to not returning since the last visit.  ?????    Holmes Hays P. Helene Kelp PT, MPH 06/28/2015 10:44 AM

## 2015-05-16 NOTE — Patient Instructions (Signed)
Lying prone across foot of bed 5 min 2-3 x/day  Standing straight working on posture and trying to get weight equal on both legs  Avoid sitting propped up in bed or in soft sofa or chairs  Massage hip with ~4 inch rubber ball

## 2015-05-23 ENCOUNTER — Encounter: Payer: Commercial Managed Care - HMO | Admitting: Physical Therapy

## 2015-05-24 ENCOUNTER — Encounter: Payer: Commercial Managed Care - HMO | Admitting: Physical Therapy

## 2015-06-07 ENCOUNTER — Ambulatory Visit: Payer: Commercial Managed Care - HMO | Admitting: Family

## 2015-06-08 ENCOUNTER — Other Ambulatory Visit: Payer: Self-pay | Admitting: Family

## 2015-06-09 ENCOUNTER — Encounter: Payer: Self-pay | Admitting: Family Medicine

## 2015-06-09 ENCOUNTER — Ambulatory Visit (INDEPENDENT_AMBULATORY_CARE_PROVIDER_SITE_OTHER): Payer: PPO | Admitting: Family Medicine

## 2015-06-09 VITALS — BP 130/80 | HR 66 | Temp 97.8°F | Ht 63.0 in | Wt 168.8 lb

## 2015-06-09 DIAGNOSIS — M25562 Pain in left knee: Secondary | ICD-10-CM

## 2015-06-09 NOTE — Assessment & Plan Note (Signed)
Knee sleeve Sport med referral Ice, elevate , rest

## 2015-06-09 NOTE — Patient Instructions (Signed)

## 2015-06-09 NOTE — Progress Notes (Signed)
Pre visit review using our clinic review tool, if applicable. No additional management support is needed unless otherwise documented below in the visit note. 

## 2015-06-09 NOTE — Progress Notes (Signed)
   Subjective:    Patient ID: Meredith Phillips, female    DOB: 1945/08/22, 69 y.o.   MRN: [id number]  HPIpt here c/o L knee pain x 4 days.  She got out of bed and R knee was swollen and she had a knot behind her knee that hurt  It has gradually improved but is still painful if she moves the wrong way.      Review of Systems  Constitutional: Negative for diaphoresis, appetite change, fatigue and unexpected weight change.  Eyes: Negative for pain, redness and visual disturbance.  Respiratory: Negative for cough, chest tightness, shortness of breath and wheezing.   Cardiovascular: Negative for chest pain, palpitations and leg swelling.  Endocrine: Negative for cold intolerance, heat intolerance, polydipsia, polyphagia and polyuria.  Genitourinary: Negative for dysuria, frequency and difficulty urinating.  Musculoskeletal: Positive for joint swelling and gait problem.  Neurological: Negative for dizziness, light-headedness, numbness and headaches.       Objective:   Physical Exam  Constitutional: She appears well-developed and well-nourished.  Musculoskeletal: She exhibits edema.       Left knee: She exhibits swelling. She exhibits no erythema. Tenderness found.  Nursing note and vitals reviewed. pain sup patella and behind knee        Assessment & Plan:

## 2015-06-15 ENCOUNTER — Ambulatory Visit (INDEPENDENT_AMBULATORY_CARE_PROVIDER_SITE_OTHER)
Admission: RE | Admit: 2015-06-15 | Discharge: 2015-06-15 | Disposition: A | Discharge status: Home / Self Care | Payer: Commercial Managed Care - HMO | Source: Ambulatory Visit | Attending: Family Medicine | Admitting: Family Medicine

## 2015-06-15 ENCOUNTER — Encounter: Payer: Self-pay | Admitting: Family Medicine

## 2015-06-15 ENCOUNTER — Other Ambulatory Visit: Payer: Self-pay | Admitting: Family

## 2015-06-15 ENCOUNTER — Ambulatory Visit (INDEPENDENT_AMBULATORY_CARE_PROVIDER_SITE_OTHER): Payer: Commercial Managed Care - HMO | Admitting: Family Medicine

## 2015-06-15 ENCOUNTER — Other Ambulatory Visit (INDEPENDENT_AMBULATORY_CARE_PROVIDER_SITE_OTHER): Payer: Commercial Managed Care - HMO

## 2015-06-15 VITALS — BP 124/80 | HR 72 | Ht 63.0 in | Wt 167.0 lb

## 2015-06-15 DIAGNOSIS — M25562 Pain in left knee: Secondary | ICD-10-CM

## 2015-06-15 DIAGNOSIS — M129 Arthropathy, unspecified: Secondary | ICD-10-CM

## 2015-06-15 DIAGNOSIS — M1712 Unilateral primary osteoarthritis, left knee: Secondary | ICD-10-CM

## 2015-06-15 DIAGNOSIS — M7632 Iliotibial band syndrome, left leg: Secondary | ICD-10-CM | POA: Insufficient documentation

## 2015-06-15 NOTE — Assessment & Plan Note (Signed)
Patient is going to have severe arthritis. X-rays are pending. Patient's was given many different treatment options and elected to try conservative therapy. Patient given a trial topical anti-inflammatories and does have up her prescription for oral anti-inflammatories. Warned of potential side effects. We did discuss vitamin D as well as over-the-counter medications a could be beneficial. We discussed possible bracing which patient declined but we will consider custom bracing in the future. Patient is going to try this. 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 I do feel that if this occurs again aspiration and injection would likely be helpful. Patient is already going to formal physical therapy for her sciatica. Patient will come back and see me again in 3-4 weeks for further evaluation and treatment.

## 2015-06-15 NOTE — Progress Notes (Signed)
[facility] [address] [place], [place] [address] Phone: [phone] Subjective:    I'm seeing this patient by the request  of:  Jeanine Luz, FNP Laury Axon, MD   CC: Left knee swelling  Meredith Phillips is a 69 y.o. female coming in with complaint of left knee swelling. Patient states that this started approximately one week ago. Had significant amount of pain that she was unable to move the knee. Does not remember any true injury. Seem to be more on the lateral and posterior aspects of the knee. States that then she had an audible pop and then swelling that seemed to go down her calf to her ankle. Patient was seen by another provider and was told to try ice and elevation and rest. Patient was referred here for further evaluation. Patient states that it is improving slowly. Seems to be doing relatively well. States that still uncomfortable but nothing that like it was before. Is able to ambulate now. Denies any numbness or radiation down the leg. Swelling has resolved.     Past Medical History  Diagnosis Date  . Diabetes mellitus without complication (H[facility])   . GERD (gastroesophageal reflux disease)   . Hypercholesteremia   . Hypertension   . Diabetic peripheral neuropathy ([facility])   . Arthritis   . Liver disease   . Polyp of colon   . Chicken pox   . Benign essential HTN 02/18/2015  . Cellulitis 02/18/2015   Past Surgical History  Procedure Laterality Date  . Hernia repair    . Rotator cuff repair    . Knee surgery    . Appendectomy    . Tonsillectomy     Social History  Substance Use Topics  . Smoking status: Never Smoker   . Smokeless tobacco: Never Used  . Alcohol Use: No   Allergies  Allergen Reactions  . Codeine   . Other     Doesn't like to take pain medication due to hallucinations.  . Shellfish Allergy    Family History  Problem Relation Age of Onset  . Healthy Mother   . Healthy Father   . Healthy Paternal  Grandmother   . Healthy Paternal Grandfather    No pertinent family history to this problem  Past medical history, social, surgical and family history all reviewed in electronic medical record.   Review of Systems: No headache, visual changes, nausea, vomiting, diarrhea, constipation, dizziness, abdominal pain, skin rash, fevers, chills, night sweats, weight loss, swollen lymph nodes, body aches, joint swelling, muscle aches, chest pain, shortness of breath, mood changes.   Objective Blood pressure 124/80, pulse 72, height 5\' 3"  (1.6 m), weight 167 lb (75.751 kg), SpO2 95 %.  General: No apparent distress alert and oriented x3 mood and affect normal, dressed appropriately.  HEENT: Pupils equal, extraocular movements intact  Respiratory: Patient's speak in full sentences and does not appear short of breath  Cardiovascular: No lower extremity edema, non tender, no erythema  Skin: Warm dry intact with no signs of infection or rash on extremities or on axial skeleton.  Abdomen: Soft nontender  Neuro: Cranial nerves II through XII are intact, neurovascularly intact in all extremities with 2+ DTRs and 2+ pulses.  Lymph: No lymphadenopathy of posterior or anterior cervical chain or axillae bilaterally.  Gait normal with good balance and coordination.  MSK:  Non tender with full range of motion and good stability and symmetric strength and tone of shoulders, elbows, wrist, hip, and  ankles bilaterally. Significant arthritic changes of multiple joints  Knee: Left Significant varus deformity noting that patient has likely arthritis of the lateral joint space Tender to palpation of the lateral joint space as well as the popliteal fossa no swelling noted ROM full in flexion and extension and lower leg rotation. Ligaments with solid consistent endpoints including ACL, PCL, LCL, MCL. Negative Mcmurray's, Apley's, and Thessalonian tests. Mild painful patellar compression. Patellar glide with moderate  crepitus. Patellar and quadriceps tendons unremarkable. Hamstring and quadriceps strength is normal.   MSK US performed of: Left This study was ordered, performed, and interpreted by Terrilee Files D.O.  Knee: All structures visualized. Severe narrowing with bone-on-bone arthritis of the lateral compartment with what appears to be a chronic meniscal tear that is displaced into the meniscal cyst noted Patellar Tendon unremarkable on long and transverse views without effusion. No abnormality of prepatellar bursa. Mild degenerative changes of the LCL Baker cyst is appreciated but small   IMPRESSION:  Severe lateral compartment arthritis of the left knee    Impression and Recommendations:     This case required medical decision making of moderate complexity.

## 2015-06-15 NOTE — Progress Notes (Signed)
Pre visit review using our clinic review tool, if applicable. No additional management support is needed unless otherwise documented below in the visit note. 

## 2015-06-15 NOTE — Patient Instructions (Addendum)
Good to see you.  Ice 20 minutes 2 times daily. Usually after activity and before bed. Exercises for your back side 3 times a week Vitamin D 2000 IU daily Turmeric 500mg  daily Wear good shoes We will get cxray of your knees on the way out as well If knee swells again on you give me a call Stay active See me again in 4 weeks if pain is not better Happy New Year!

## 2015-06-29 ENCOUNTER — Ambulatory Visit (INDEPENDENT_AMBULATORY_CARE_PROVIDER_SITE_OTHER): Payer: PPO | Admitting: Family

## 2015-06-29 ENCOUNTER — Encounter: Payer: Self-pay | Admitting: Family

## 2015-06-29 VITALS — BP 202/102 | HR 70 | Temp 98.0°F | Resp 18 | Ht 63.0 in | Wt 170.0 lb

## 2015-06-29 DIAGNOSIS — M25551 Pain in right hip: Secondary | ICD-10-CM

## 2015-06-29 DIAGNOSIS — E119 Type 2 diabetes mellitus without complications: Secondary | ICD-10-CM | POA: Diagnosis not present

## 2015-06-29 DIAGNOSIS — I10 Essential (primary) hypertension: Secondary | ICD-10-CM | POA: Diagnosis not present

## 2015-06-29 DIAGNOSIS — M25559 Pain in unspecified hip: Secondary | ICD-10-CM | POA: Insufficient documentation

## 2015-06-29 NOTE — Assessment & Plan Note (Signed)
Hip pain of questionable origin whether from hip origin edition or radiation from the lumbar spine. There is concern for osteoarthritis of the right hip with current exam. Obtain x-ray. Treat conservatively with ice and home exercise therapy. Start vitamin D. Follow-up in 3 weeks or sooner if symptoms worsen or fail to improve.

## 2015-06-29 NOTE — Assessment & Plan Note (Addendum)
Hypertension is uncontrolled today with elevated blood pressure of 180/90. Question compliance with medication regimen as patient is unable to recall taking her medications as prescribed. Encouraged compliance with medication regimen and to follow up with home blood readings and to seek emergency care blood pressure remains elevated or symptoms of end organ damage develop. Advised to seek emergency care if worst headache of her life develops. Continue current dosage of losartan, clonidine, carvedilol, and amlodipine. Follow-up pending blood pressure results.

## 2015-06-29 NOTE — Progress Notes (Signed)
Subjective:    Patient ID: Meredith Phillips, female    DOB: 11/23/1945, 70 y.o.   MRN: [id number]  Chief Complaint  Patient presents with  . Medication Management    wants to talk about changing metformin, having pain in her right hip, burning and throbbing    HPI:  Meredith Phillips is a 70 y.o. female who  has a past medical history of Diabetes mellitus without complication (HCC); GERD (gastroesophageal reflux disease); Hypercholesteremia; Hypertension; Diabetic peripheral neuropathy (H[facility]); Arthritis; Liver disease; Polyp of colon; Chicken pox; Benign essential HTN (02/18/2015); and Cellulitis (02/18/2015). and presents today for a follow up office visit.   1.) Type 2 diabetes - Currently maintained on metformin and is reported as stable. She takes the medication as prescribed and denies adverse side effects. Previous A1c of 5.6. Has seen a few reports in the news and is concerned about long term use of metformin.    2.) Hypertension - Currently maintained on clonidine, amlodipine, carvedilolol, and losartan. States that she does not always take her medications as prescribed. Denies symptoms of end organ damage or changes in vision. She does have a headache currently but denies the worse headache of her life.   BP Readings from Last 3 Encounters:  06/29/15 202/102  06/15/15 124/80  06/09/15 130/80   3.) Hip/back pain - Previously diagnosed with sciatica and is currently being treated by back specialists. She continues to experience pain located in her right gluteal and around her hip. Describes the pain as sharp with a severity that is enough to cause her to alter her gait. She notes that her symptoms have progressively worsened since her previous visit.   Allergies  Allergen Reactions  . Codeine   . Other     Doesn't like to take pain medication due to hallucinations.  [name] Shellfish Allergy      Current Outpatient Prescriptions on File Prior to Visit  Medication Sig Dispense Refill  .  amLODipine (NORVASC) 10 MG tablet Take 10 mg by mouth daily.    . carvedilol (COREG) 3.125 MG tablet Take 3.125 mg by mouth 2 (two) times daily with a meal.    . Cholecalciferol (VITAMIN D-3) 1000 UNITS CAPS Take 1,000 Units by mouth.    . cloNIDine (CATAPRES) 0.1 MG tablet Take 0.1 mg by mouth 2 (two) times daily.    [name] dicyclomine (BENTYL) 10 MG capsule Take 10 mg by mouth as needed for spasms.    [name] gabapentin (NEURONTIN) 300 MG capsule TAKE ONE CAPSULE BY MOUTH 3 TIMES A DAY 90 capsule 1  . losartan (COZAAR) 100 MG tablet Take 100 mg by mouth daily.    . metFORMIN (GLUMETZA) 500 MG (MOD) 24 hr tablet TAKE 1 TABLET BY MOUTH TWICE A DAY 180 tablet 0  . naproxen (NAPROSYN) 500 MG tablet TAKE 1 TABLET BY MOUTH TWICE A DAY WITH A MEAL 60 tablet 1  . polycarbophil (FIBERCON) 625 MG tablet Take 625 mg by mouth.    . simvastatin (ZOCOR) 20 MG tablet Take 20 mg by mouth daily.     No current facility-administered medications on file prior to visit.    Past Surgical History  Procedure Laterality Date  . Hernia repair    . Rotator cuff repair    . Knee surgery    . Appendectomy    . Tonsillectomy      Past Medical History  Diagnosis Date  . Diabetes mellitus without complication (H[facility])   . GERD (gastroesophageal reflux  disease)   . Hypercholesteremia   . Hypertension   . Diabetic peripheral neuropathy ([facility])   . Arthritis   . Liver disease   . Polyp of colon   . Chicken pox   . Benign essential HTN 02/18/2015  . Cellulitis 02/18/2015     Review of Systems  Constitutional: Negative for fever and chills.  Eyes:       Negative for changes in vision.  Respiratory: Negative for chest tightness.   Cardiovascular: Negative for chest pain, palpitations and leg swelling.  Neurological: Positive for headaches.      Objective:    BP 202/102 mmHg  Pulse 70  Temp(Src) 98 F (36.7 C) (Oral)  Resp 18  Ht 5\' 3"  (1.6 m)  Wt 170 lb (77.111 kg)  BMI 30.12 kg/m2  SpO2 99% Nursing note and  vital signs reviewed.  Physical Exam  Constitutional: She is oriented to person, place, and time. She appears well-developed and well-nourished. No distress.  Cardiovascular: Normal rate, regular rhythm, normal heart sounds and intact distal pulses.   Pulmonary/Chest: Effort normal and breath sounds normal.  Musculoskeletal:  Right hip - no obvious deformity, discoloration, or edema noted. Tenderness elicited around right gluteal, greater trochanter and anterior hip. Range of motion is within normal limits. Strength is intact and appropriate with discomfort with internal rotation. Distal pulses are intact and appropriate. Hip scarring is negative. Faber's test is uncomfortable.  Neurological: She is alert and oriented to person, place, and time.  Skin: Skin is warm and dry.  Psychiatric: She has a normal mood and affect. Her behavior is normal. Judgment and thought content normal.       Assessment & Plan:   Problem List Items Addressed This Visit      Cardiovascular and Mediastinum   Benign essential HTN    Hypertension is uncontrolled today with elevated blood pressure of 180/90. Question compliance with medication regimen as patient is unable to recall taking her medications as prescribed. Encouraged compliance with medication regimen and to follow up with home blood readings and to seek emergency care blood pressure remains elevated or symptoms of end organ damage develop. Advised to seek emergency care if worst headache of her life develops. Continue current dosage of losartan, clonidine, carvedilol, and amlodipine. Follow-up pending blood pressure results.        Endocrine   Type 2 diabetes mellitus (HCC)    Type 2 diabetes is well controlled with current regimen of metformin. Discussed risks and benefits of continued medication use. Following discussion, patient wishes to continue current dosage of metformin. We'll check A1c at next office visit.        Other   Hip pain    Hip  pain of questionable origin whether from hip origin edition or radiation from the lumbar spine. There is concern for osteoarthritis of the right hip with current exam. Obtain x-ray. Treat conservatively with ice and home exercise therapy. Start vitamin D. Follow-up in 3 weeks or sooner if symptoms worsen or fail to improve.       Other Visit Diagnoses    Right hip pain    -  Primary    Relevant Orders    DG HIP UNILAT WITH PELVIS MIN 4 VIEWS RIGHT

## 2015-06-29 NOTE — Patient Instructions (Signed)
Thank you for choosing [facility].  Summary/Instructions:  Your prescription(s) have been submitted to your pharmacy or been printed and provided for you. Please take as directed and contact our office if you believe you are having problem(s) with the medication(s) or have any questions.  Please stop by radiology on the basement level of the building for your x-rays. Your results will be released to MyChart (or called to you) after review, usually within 72 hours after test completion. If any treatments or changes are necessary, you will be notified at that same time.  If your symptoms worsen or fail to improve, please contact our office for further instruction, or in case of emergency go directly to the emergency room at the closest medical facility.   Please continue to take your medication as prescribed.

## 2015-06-29 NOTE — Progress Notes (Signed)
Pre visit review using our clinic review tool, if applicable. No additional management support is needed unless otherwise documented below in the visit note. 

## 2015-06-29 NOTE — Assessment & Plan Note (Signed)
Type 2 diabetes is well controlled with current regimen of metformin. Discussed risks and benefits of continued medication use. Following discussion, patient wishes to continue current dosage of metformin. We'll check A1c at next office visit.

## 2015-07-04 ENCOUNTER — Ambulatory Visit (INDEPENDENT_AMBULATORY_CARE_PROVIDER_SITE_OTHER)
Admission: RE | Admit: 2015-07-04 | Discharge: 2015-07-04 | Disposition: A | Discharge status: Home / Self Care | Payer: Commercial Managed Care - HMO | Source: Ambulatory Visit | Attending: Family | Admitting: Family

## 2015-07-04 ENCOUNTER — Telehealth: Payer: Self-pay | Admitting: Family

## 2015-07-04 ENCOUNTER — Other Ambulatory Visit: Payer: Self-pay | Admitting: Family

## 2015-07-04 DIAGNOSIS — M25551 Pain in right hip: Secondary | ICD-10-CM

## 2015-07-04 NOTE — Telephone Encounter (Signed)
Please inform patient that her x-rays of her hip are negative for any fractures or bone related issues. Therefore the pain may still be related to her back.

## 2015-07-05 ENCOUNTER — Other Ambulatory Visit: Payer: Self-pay | Admitting: Family

## 2015-07-06 NOTE — Telephone Encounter (Signed)
Pt aware of results 

## 2015-07-12 ENCOUNTER — Ambulatory Visit: Payer: Commercial Managed Care - HMO | Admitting: Family Medicine

## 2015-07-18 ENCOUNTER — Telehealth: Payer: Self-pay | Admitting: *Deleted

## 2015-07-18 MED ORDER — SIMVASTATIN 20 MG PO TABS: 20.0000 mg | ORAL_TABLET | Freq: Every day | ORAL | Status: DC

## 2015-07-18 NOTE — Telephone Encounter (Signed)
Receive call pt is needing refill on her simvastatin. Verified pharmacy inform will send electronically to [facility].../lmb

## 2015-07-25 ENCOUNTER — Other Ambulatory Visit: Payer: Self-pay

## 2015-07-25 MED ORDER — CARVEDILOL 3.125 MG PO TABS: 3.1250 mg | ORAL_TABLET | Freq: Two times a day (BID) | ORAL | Status: DC

## 2015-07-25 MED ORDER — CLONIDINE HCL 0.1 MG PO TABS: 0.1000 mg | ORAL_TABLET | Freq: Two times a day (BID) | ORAL | Status: DC

## 2015-08-02 ENCOUNTER — Ambulatory Visit: Payer: Commercial Managed Care - HMO | Admitting: Family

## 2015-08-03 ENCOUNTER — Ambulatory Visit (INDEPENDENT_AMBULATORY_CARE_PROVIDER_SITE_OTHER): Payer: PPO | Admitting: Family

## 2015-08-03 ENCOUNTER — Encounter: Payer: Self-pay | Admitting: Family

## 2015-08-03 VITALS — BP 142/98 | HR 77 | Temp 97.9°F | Resp 16 | Ht 63.0 in | Wt 165.0 lb

## 2015-08-03 DIAGNOSIS — M660 Rupture of popliteal cyst: Secondary | ICD-10-CM | POA: Diagnosis not present

## 2015-08-03 DIAGNOSIS — M129 Arthropathy, unspecified: Secondary | ICD-10-CM

## 2015-08-03 DIAGNOSIS — M1712 Unilateral primary osteoarthritis, left knee: Secondary | ICD-10-CM

## 2015-08-03 NOTE — Progress Notes (Signed)
   Subjective:    Patient ID: Meredith Phillips, female    DOB: 12-19-1945, 70 y.o.   MRN: [id number]  Chief Complaint  Patient presents with  . Leg Pain    has a burning pain in her left leg that goes down to her ankle, feels like she has a fluid pocket in her left ankle, was told by Dr. Katrinka Blazing that she has baker cysts    HPI:  Meredith Phillips is a 70 y.o. female who  has a past medical history of Diabetes mellitus without complication (HCC); GERD (gastroesophageal reflux disease); Hypercholesteremia; Hypertension; Diabetic peripheral neuropathy (HCC); Arthritis; Liver disease; Polyp of colon; Chicken pox; Benign essential HTN (02/18/2015); and Cellulitis (02/18/2015). and presents today for an office visit.   Associated symptom of pain located in her left leg and swelling has been going on for several weeks. Pain severity is noted to be severe when it comes and describes that her leg feels like it catches and she has a sharp pain. Recently evaluated by Dr.Smith and noted to have have a Perryman meniscal cyst and a Baker cyst. The left leg is painful when she puts weight on it.   Review of Systems    Objective:    BP 142/98 mmHg  Pulse 77  Temp(Src) 97.9 F (36.6 C) (Oral)  Resp 16  Ht 5\' 3"  (1.6 m)  Wt 165 lb (74.844 kg)  BMI 29.24 kg/m2  SpO2 98% Nursing note and vital signs reviewed.  Physical Exam  Constitutional: She is oriented to person, place, and time. She appears well-developed and well-nourished. No distress.  Cardiovascular: Normal rate, regular rhythm, normal heart sounds and intact distal pulses.   Pulmonary/Chest: Effort normal and breath sounds normal.  Musculoskeletal:  Left knee - no obvious deformity or discoloration with mild edema noted. Some increased temperature present on the posterior medial joint line.  Neurological: She is alert and oriented to person, place, and time.  Skin: Skin is warm and dry.  Psychiatric: She has a normal mood and affect. Her behavior  is normal. Judgment and thought content normal.       Assessment & Plan:   Problem List Items Addressed This Visit      Musculoskeletal and Integument   Arthritis of left knee - Primary   Baker's cyst, ruptured    Symptoms and exam consistent with ruptured Baker's Cyst. Treat conservatively with ice, compression sleeve and elevation. Antiinflammatoires as previously prescribed. Follow up if symptoms worsen or fail to improve.

## 2015-08-03 NOTE — Patient Instructions (Signed)
Thank you for choosing [facility].  Summary/Instructions:  Please ice and compress your knee. Antiinflammatories as previously discussed. Follow up if symptoms worsen.  If your symptoms worsen or fail to improve, please contact our office for further instruction, or in case of emergency go directly to the emergency room at the closest medical facility.

## 2015-08-03 NOTE — Assessment & Plan Note (Signed)
Symptoms and exam consistent with ruptured Baker's Cyst. Treat conservatively with ice, compression sleeve and elevation. Antiinflammatoires as previously prescribed. Follow up if symptoms worsen or fail to improve.

## 2015-08-06 ENCOUNTER — Ambulatory Visit (INDEPENDENT_AMBULATORY_CARE_PROVIDER_SITE_OTHER): Payer: PPO | Admitting: Family

## 2015-08-06 ENCOUNTER — Encounter: Payer: Self-pay | Admitting: Family

## 2015-08-06 VITALS — BP 144/78 | HR 62 | Temp 98.2°F | Resp 16 | Ht 63.0 in | Wt 167.0 lb

## 2015-08-06 DIAGNOSIS — K2100 Gastro-esophageal reflux disease with esophagitis, without bleeding: Secondary | ICD-10-CM

## 2015-08-06 DIAGNOSIS — K219 Gastro-esophageal reflux disease without esophagitis: Secondary | ICD-10-CM | POA: Insufficient documentation

## 2015-08-06 NOTE — Progress Notes (Signed)
Pre visit review using our clinic review tool, if applicable. No additional management support is needed unless otherwise documented below in the visit note. 

## 2015-08-06 NOTE — Patient Instructions (Addendum)
Thank you for choosing [facility].  Summary/Instructions:  Please continue to take your medications as prescribed.  Follow up if symptoms worsen.   If your symptoms worsen or fail to improve, please contact our office for further instruction, or in case of emergency go directly to the emergency room at the closest medical facility.    Gastroesophageal Reflux Disease, Adult Normally, food travels down the esophagus and stays in the stomach to be digested. However, when a person has gastroesophageal reflux disease (GERD), food and stomach acid move back up into the esophagus. When this happens, the esophagus becomes sore and inflamed. Over time, GERD can create small holes (ulcers) in the lining of the esophagus.  CAUSES This condition is caused by a problem with the muscle between the esophagus and the stomach (lower esophageal sphincter, or LES). Normally, the LES muscle closes after food passes through the esophagus to the stomach. When the LES is weakened or abnormal, it does not close properly, and that allows food and stomach acid to go back up into the esophagus. The LES can be weakened by certain dietary substances, medicines, and medical conditions, including:  Tobacco use.  Pregnancy.  Having a hiatal hernia.  Heavy alcohol use.  Certain foods and beverages, such as coffee, chocolate, onions, and peppermint. RISK FACTORS This condition is more likely to develop in:  People who have an increased body weight.  People who have connective tissue disorders.  People who use NSAID medicines. SYMPTOMS Symptoms of this condition include:  Heartburn.  Difficult or painful swallowing.  The feeling of having a lump in the throat.  Abitter taste in the mouth.  Bad breath.  Having a large amount of saliva.  Having an upset or bloated stomach.  Belching.  Chest pain.  Shortness of breath or wheezing.  Ongoing (chronic) cough or a night-time cough.  Wearing  away of tooth enamel.  Weight loss. Different conditions can cause chest pain. Make sure to see your health care provider if you experience chest pain. DIAGNOSIS Your health care provider will take a medical history and perform a physical exam. To determine if you have mild or severe GERD, your health care provider may also monitor how you respond to treatment. You may also have other tests, including:  An endoscopy toexamine your stomach and esophagus with a small camera.  A test thatmeasures the acidity level in your esophagus.  A test thatmeasures how much pressure is on your esophagus.  A barium swallow or modified barium swallow to show the shape, size, and functioning of your esophagus. TREATMENT The goal of treatment is to help relieve your symptoms and to prevent complications. Treatment for this condition may vary depending on how severe your symptoms are. Your health care provider may recommend:  Changes to your diet.  Medicine.  Surgery. HOME CARE INSTRUCTIONS Diet  Follow a diet as recommended by your health care provider. This may involve avoiding foods and drinks such as:  Coffee and tea (with or without caffeine).  Drinks that containalcohol.  Energy drinks and sports drinks.  Carbonated drinks or sodas.  Chocolate and cocoa.  Peppermint and mint flavorings.  Garlic and onions.  Horseradish.  Spicy and acidic foods, including peppers, chili powder, curry powder, vinegar, hot sauces, and barbecue sauce.  Citrus fruit juices and citrus fruits, such as oranges, lemons, and limes.  Tomato-based foods, such as red sauce, chili, salsa, and pizza with red sauce.  Fried and fatty foods, such as donuts, french fries,  potato chips, and high-fat dressings.  High-fat meats, such as hot dogs and fatty cuts of red and white meats, such as rib eye steak, sausage, ham, and bacon.  High-fat dairy items, such as whole milk, butter, and cream cheese.  Eat small,  frequent meals instead of large meals.  Avoid drinking large amounts of liquid with your meals.  Avoid eating meals during the 2-3 hours before bedtime.  Avoid lying down right after you eat.  Do not exercise right after you eat. General Instructions  Pay attention to any changes in your symptoms.  Take over-the-counter and prescription medicines only as told by your health care provider. Do not take aspirin, ibuprofen, or other NSAIDs unless your health care provider told you to do so.  Do not use any tobacco products, including cigarettes, chewing tobacco, and e-cigarettes. If you need help quitting, ask your health care provider.  Wear loose-fitting clothing. Do not wear anything tight around your waist that causes pressure on your abdomen.  Raise (elevate) the head of your bed 6 inches (15cm).  Try to reduce your stress, such as with yoga or meditation. If you need help reducing stress, ask your health care provider.  If you are overweight, reduce your weight to an amount that is healthy for you. Ask your health care provider for guidance about a safe weight loss goal.  Keep all follow-up visits as told by your health care provider. This is important. SEEK MEDICAL CARE IF:  You have new symptoms.  You have unexplained weight loss.  You have difficulty swallowing, or it hurts to swallow.  You have wheezing or a persistent cough.  Your symptoms do not improve with treatment.  You have a hoarse voice. SEEK IMMEDIATE MEDICAL CARE IF:  You have pain in your arms, neck, jaw, teeth, or back.  You feel sweaty, dizzy, or light-headed.  You have chest pain or shortness of breath.  You vomit and your vomit looks like blood or coffee grounds.  You faint.  Your stool is bloody or black.  You cannot swallow, drink, or eat.   This information is not intended to replace advice given to you by your health care provider. Make sure you discuss any questions you have with  your health care provider.   Document Released: 03/12/2005 Document Revised: 02/21/2015 Document Reviewed: 09/27/2014 Elsevier Interactive Patient Education 2016 [employer].  Food Choices for Gastroesophageal Reflux Disease, Adult When you have gastroesophageal reflux disease (GERD), the foods you eat and your eating habits are very important. Choosing the right foods can help ease the discomfort of GERD. WHAT GENERAL GUIDELINES DO I NEED TO FOLLOW?  Choose fruits, vegetables, whole grains, low-fat dairy products, and low-fat meat, fish, and poultry.  Limit fats such as oils, salad dressings, butter, nuts, and avocado.  Keep a food diary to identify foods that cause symptoms.  Avoid foods that cause reflux. These may be different for different people.  Eat frequent small meals instead of three large meals each day.  Eat your meals slowly, in a relaxed setting.  Limit fried foods.  Cook foods using methods other than frying.  Avoid drinking alcohol.  Avoid drinking large amounts of liquids with your meals.  Avoid bending over or lying down until 2-3 hours after eating. WHAT FOODS ARE NOT RECOMMENDED? The following are some foods and drinks that may worsen your symptoms: Vegetables Tomatoes. Tomato juice. Tomato and spaghetti sauce. Chili peppers. Onion and garlic. Horseradish. Fruits Oranges, grapefruit, and lemon (fruit  and juice). Meats High-fat meats, fish, and poultry. This includes hot dogs, ribs, ham, sausage, salami, and bacon. Dairy Whole milk and chocolate milk. Sour cream. Cream. Butter. Ice cream. Cream cheese.  Beverages Coffee and tea, with or without caffeine. Carbonated beverages or energy drinks. Condiments Hot sauce. Barbecue sauce.  Sweets/Desserts Chocolate and cocoa. Donuts. Peppermint and spearmint. Fats and Oils High-fat foods, including [place] fries and potato chips. Other Vinegar. Strong spices, such as black pepper, white pepper, red pepper,  cayenne, curry powder, cloves, ginger, and chili powder. The items listed above may not be a complete list of foods and beverages to avoid. Contact your dietitian for more information.   This information is not intended to replace advice given to you by your health care provider. Make sure you discuss any questions you have with your health care provider.   Document Released: 06/02/2005 Document Revised: 06/23/2014 Document Reviewed: 04/06/2013 Elsevier Interactive Patient Education [employer]

## 2015-08-06 NOTE — Progress Notes (Signed)
Subjective:    Patient ID: Meredith Phillips, female    DOB: [birth year], 70 y.o.   MRN: [id number]  Chief Complaint  Patient presents with  . Chest Pain    HPI:  Meredith Phillips is a 70 y.o. female who  has a past medical history of Diabetes mellitus without complication (HCC); GERD (gastroesophageal reflux disease); Hypercholesteremia; Hypertension; Diabetic peripheral neuropathy (HCC); Arthritis; Liver disease; Polyp of colon; Chicken pox; Benign essential HTN (02/18/2015); and Cellulitis (02/18/2015). and presents today for a follow up office visit.    1.) Chest pain - Associated symptom of chest pain located in the center of her chest and is described as burning has been waxing and waning for a couple of weeks with the pain lasting for about 3 days. Modifying factors include an antacid medication that helped to relieve her symptoms. Denies nausea, vomiting, diarrhea or constipation. No blood in her stool. No heart palpitations, shortness of breath or dyspnea on exertion.    Allergies  Allergen Reactions  . Codeine   . Other     Doesn't like to take pain medication due to hallucinations.  [name] Shellfish Allergy      Current Outpatient Prescriptions on File Prior to Visit  Medication Sig Dispense Refill  . amLODipine (NORVASC) 10 MG tablet Take 10 mg by mouth daily.    . carvedilol (COREG) 3.125 MG tablet Take 1 tablet (3.125 mg total) by mouth 2 (two) times daily with a meal. 60 tablet 5  . Cholecalciferol (VITAMIN D-3) 1000 UNITS CAPS Take 1,000 Units by mouth.    . cloNIDine (CATAPRES) 0.1 MG tablet Take 1 tablet (0.1 mg total) by mouth 2 (two) times daily. 60 tablet 5  . dicyclomine (BENTYL) 10 MG capsule Take 10 mg by mouth as needed for spasms.    [name] gabapentin (NEURONTIN) 300 MG capsule TAKE ONE CAPSULE BY MOUTH 3 TIMES A DAY 90 capsule 2  . losartan (COZAAR) 100 MG tablet Take 100 mg by mouth daily.    . metFORMIN (GLUMETZA) 500 MG (MOD) 24 hr tablet TAKE 1 TABLET BY MOUTH TWICE A  DAY 180 tablet 0  . naproxen (NAPROSYN) 500 MG tablet TAKE 1 TABLET BY MOUTH TWICE A DAY WITH A MEAL 60 tablet 1  . polycarbophil (FIBERCON) 625 MG tablet Take 625 mg by mouth.    . simvastatin (ZOCOR) 20 MG tablet Take 1 tablet (20 mg total) by mouth daily. 90 tablet 1   No current facility-administered medications on file prior to visit.    Review of Systems  Constitutional: Negative for fever and chills.  Respiratory: Negative for cough, chest tightness, shortness of breath and wheezing.   Cardiovascular: Positive for chest pain. Negative for palpitations and leg swelling.  Gastrointestinal: Negative for nausea, vomiting, abdominal pain, diarrhea, constipation and blood in stool.  Neurological: Negative for weakness.      Objective:    BP 144/78 mmHg  Pulse 62  Temp(Src) 98.2 F (36.8 C) (Oral)  Resp 16  Ht 5\' 3"  (1.6 m)  Wt 167 lb (75.751 kg)  BMI 29.59 kg/m2  SpO2 98% Nursing note and vital signs reviewed.  Physical Exam  Constitutional: She is oriented to person, place, and time. She appears well-developed and well-nourished. No distress.  Cardiovascular: Normal rate, regular rhythm, normal heart sounds and intact distal pulses.   Pulmonary/Chest: Effort normal and breath sounds normal.  Abdominal: Soft. Normal appearance and bowel sounds are normal. She exhibits no mass. There is no hepatosplenomegaly.  There is no tenderness. There is no rigidity, no rebound, no guarding, no tenderness at McBurney's point and negative Murphy's sign.  Neurological: She is alert and oriented to person, place, and time.  Skin: Skin is warm and dry.  Psychiatric: She has a normal mood and affect. Her behavior is normal. Judgment and thought content normal.       Assessment & Plan:   Problem List Items Addressed This Visit      Digestive   GERD (gastroesophageal reflux disease) - Primary    Symptoms and exam consistent with exacerbation of gastroesophageal reflux. Continue current  dosage of Nexium. Information provided regarding GERD diet and food choices. Follow-up if symptoms worsen or fail to improve.

## 2015-08-06 NOTE — Assessment & Plan Note (Signed)
Symptoms and exam consistent with exacerbation of gastroesophageal reflux. Continue current dosage of Nexium. Information provided regarding GERD diet and food choices. Follow-up if symptoms worsen or fail to improve.

## 2015-08-09 ENCOUNTER — Telehealth: Payer: Self-pay | Admitting: Family

## 2015-08-09 NOTE — Telephone Encounter (Signed)
Pt was told to call in some medications that she has taken to put on her chart. The one for her hernia: ranitidine 150 mg The one for acid reflux: esomeprazole mag 40 mg cap

## 2015-08-15 NOTE — Telephone Encounter (Signed)
Medications have been added to med list.

## 2015-08-21 ENCOUNTER — Encounter: Payer: Self-pay | Admitting: Family

## 2015-08-21 ENCOUNTER — Ambulatory Visit (INDEPENDENT_AMBULATORY_CARE_PROVIDER_SITE_OTHER): Payer: PPO | Admitting: Family

## 2015-08-21 VITALS — BP 150/86 | HR 74 | Temp 98.0°F | Resp 16 | Ht 63.0 in | Wt 170.0 lb

## 2015-08-21 DIAGNOSIS — M25562 Pain in left knee: Secondary | ICD-10-CM | POA: Diagnosis not present

## 2015-08-21 DIAGNOSIS — M25551 Pain in right hip: Secondary | ICD-10-CM

## 2015-08-21 DIAGNOSIS — R42 Dizziness and giddiness: Secondary | ICD-10-CM | POA: Insufficient documentation

## 2015-08-21 NOTE — Assessment & Plan Note (Signed)
Symptoms of lightheadedness most likely associated with dehydration as she drinks a fair amount of caffeine over the course of the day. Decrease caffeinated beverages. Encourage water. Change position slowly. Follow-up if symptoms worsen or fail to improve.

## 2015-08-21 NOTE — Assessment & Plan Note (Signed)
Continues to experience the associated symptom of right hip pain of questionable origin. X-rays were negative for arthritis or underlying pathology. Question muscle muscle origin cannot rule out gluteus medius. Refer to orthopedics for further assessment and evaluation. Follow-up if symptoms worsen or fail to improve.

## 2015-08-21 NOTE — Patient Instructions (Addendum)
Thank you for choosing [facility].  Summary/Instructions:  Referrals have been made during this visit. You should expect to hear back from our schedulers in about 7-10 days in regards to establishing an appointment with the specialists we discussed.   If your symptoms worsen or fail to improve, please contact our office for further instruction, or in case of emergency go directly to the emergency room at the closest medical facility.   Keep taking your medications as prescribed.   Please drink plenty of non-caffeinated beverages.  Change positions slowly.  Dizziness Dizziness is a common problem. It is a feeling of unsteadiness or light-headedness. You may feel like you are about to faint. Dizziness can lead to injury if you stumble or fall. Anyone can become dizzy, but dizziness is more common in older adults. This condition can be caused by a number of things, including medicines, dehydration, or illness. HOME CARE INSTRUCTIONS Taking these steps may help with your condition: Eating and Drinking  Drink enough fluid to keep your urine clear or pale yellow. This helps to keep you from becoming dehydrated. Try to drink more clear fluids, such as water.  Do not drink alcohol.  Limit your caffeine intake if directed by your health care provider.  Limit your salt intake if directed by your health care provider. Activity  Avoid making quick movements.  Rise slowly from chairs and steady yourself until you feel okay.  In the morning, first sit up on the side of the bed. When you feel okay, stand slowly while you hold onto something until you know that your balance is fine.  Move your legs often if you need to stand in one place for a long time. Tighten and relax your muscles in your legs while you are standing.  Do not drive or operate heavy machinery if you feel dizzy.  Avoid bending down if you feel dizzy. Place items in your home so that they are easy for you to reach  without leaning over. Lifestyle  Do not use any tobacco products, including cigarettes, chewing tobacco, or electronic cigarettes. If you need help quitting, ask your health care provider.  Try to reduce your stress level, such as with yoga or meditation. Talk with your health care provider if you need help. General Instructions  Watch your dizziness for any changes.  Take medicines only as directed by your health care provider. Talk with your health care provider if you think that your dizziness is caused by a medicine that you are taking.  Tell a friend or a family member that you are feeling dizzy. If he or she notices any changes in your behavior, have this person call your health care provider.  Keep all follow-up visits as directed by your health care provider. This is important. SEEK MEDICAL CARE IF:  Your dizziness does not go away.  Your dizziness or light-headedness gets worse.  You feel nauseous.  You have reduced hearing.  You have new symptoms.  You are unsteady on your feet or you feel like the room is spinning. SEEK IMMEDIATE MEDICAL CARE IF:  You vomit or have diarrhea and are unable to eat or drink anything.  You have problems talking, walking, swallowing, or using your arms, hands, or legs.  You feel generally weak.  You are not thinking clearly or you have trouble forming sentences. It may take a friend or family member to notice this.  You have chest pain, abdominal pain, shortness of breath, or sweating.  Your  vision changes.  You notice any bleeding.  You have a headache.  You have neck pain or a stiff neck.  You have a fever.   This information is not intended to replace advice given to you by your health care provider. Make sure you discuss any questions you have with your health care provider.   Document Released: 11/26/2000 Document Revised: 10/17/2014 Document Reviewed: 05/29/2014 Elsevier Interactive Patient Education [employer].

## 2015-08-21 NOTE — Assessment & Plan Note (Signed)
Continues to experience left knee pain most likely associated with ruptured Baker's cyst. Continue ice, compression, and over-the-counter medications as needed for symptom relief and supportive care. Refer to orthopedics for further evaluation assessment.

## 2015-08-21 NOTE — Progress Notes (Signed)
Subjective:    Patient ID: Meredith Phillips, female    DOB: [birth year], 70 y.o.   MRN: [id number]  Chief Complaint  Patient presents with  . Knee Pain    having some dizzy spells, does not like taking all the meds she is taking for her legs they just make her sleep, referral to [facility], hurting in both legs now starting from her hips    HPI:  Meredith Phillips is a 70 y.o. female who  has a past medical history of Diabetes mellitus without complication (HCC); GERD (gastroesophageal reflux disease); Hypercholesteremia; Hypertension; Diabetic peripheral neuropathy (HCC); Arthritis; Liver disease; Polyp of colon; Chicken pox; Benign essential HTN (02/18/2015); and Cellulitis (02/18/2015). and presents today for a follow up office visit.   1.) Knee pain - Previously noted to have a Baker's Cyst that may have potentially ruptured. She has been taking the naproxen as well as icing with the knee sleeve and notes that her symptoms have slowly worsened. Describes difficulty with steps.   2.) Hip pain - Continues to experience the associated symptom of pain located in her right hip that has been going on for several months and described as worsening. Previous x-rays showed no evidence of arthritis or underlying pathology.   2.) Dizzy spells - Associated symptom of dizziness has been going on for about 1 day. Described as feeling lightheadedness. States that she drinks a lot of water. Denies any congestion or illness. Symptoms are constant. There are no modifying factors that make it better or treatments that have been attempted.   Allergies  Allergen Reactions  . Codeine   . Other     Doesn't like to take pain medication due to hallucinations.  [name] Shellfish Allergy     Current Outpatient Prescriptions on File Prior to Visit  Medication Sig Dispense Refill  . amLODipine (NORVASC) 10 MG tablet Take 10 mg by mouth daily.    . carvedilol (COREG) 3.125 MG tablet Take 1 tablet (3.125 mg total) by mouth 2  (two) times daily with a meal. 60 tablet 5  . Cholecalciferol (VITAMIN D-3) 1000 UNITS CAPS Take 1,000 Units by mouth.    . cloNIDine (CATAPRES) 0.1 MG tablet Take 1 tablet (0.1 mg total) by mouth 2 (two) times daily. 60 tablet 5  . dicyclomine (BENTYL) 10 MG capsule Take 10 mg by mouth as needed for spasms.    [name] esomeprazole (NEXIUM) 40 MG capsule Take 40 mg by mouth daily at 12 noon.    . gabapentin (NEURONTIN) 300 MG capsule TAKE ONE CAPSULE BY MOUTH 3 TIMES A DAY 90 capsule 2  . losartan (COZAAR) 100 MG tablet Take 100 mg by mouth daily.    . metFORMIN (GLUMETZA) 500 MG (MOD) 24 hr tablet TAKE 1 TABLET BY MOUTH TWICE A DAY 180 tablet 0  . polycarbophil (FIBERCON) 625 MG tablet Take 625 mg by mouth.    . ranitidine (ZANTAC) 150 MG capsule Take 150 mg by mouth 2 (two) times daily.    . simvastatin (ZOCOR) 20 MG tablet Take 1 tablet (20 mg total) by mouth daily. 90 tablet 1   No current facility-administered medications on file prior to visit.    Past Medical History  Diagnosis Date  . Diabetes mellitus without complication (HCC)   . GERD (gastroesophageal reflux disease)   . Hypercholesteremia   . Hypertension   . Diabetic peripheral neuropathy (HCC)   . Arthritis   . Liver disease   . Polyp of colon   .  Chicken pox   . Benign essential HTN 02/18/2015  . Cellulitis 02/18/2015    Review of Systems  Constitutional: Negative for fever and chills.  Respiratory: Negative for chest tightness and shortness of breath.   Cardiovascular: Negative for chest pain, palpitations and leg swelling.  Neurological: Positive for light-headedness.      Objective:    BP 150/86 mmHg  Pulse 74  Temp(Src) 98 F (36.7 C) (Oral)  Resp 16  Ht 5\' 3"  (1.6 m)  Wt 170 lb (77.111 kg)  BMI 30.12 kg/m2  SpO2 97% Nursing note and vital signs reviewed.  Physical Exam  Constitutional: She is oriented to person, place, and time. She appears well-developed and well-nourished. No distress.  Cardiovascular:  Normal rate, regular rhythm, normal heart sounds and intact distal pulses.   Pulmonary/Chest: Effort normal and breath sounds normal.  Musculoskeletal:  Left knee - no obvious deformity or discoloration with mild edema noted. Some increased temperature present on the posterior medial joint line.   Neurological: She is alert and oriented to person, place, and time.  Skin: Skin is warm and dry.  Psychiatric: She has a normal mood and affect. Her behavior is normal. Judgment and thought content normal.       Assessment & Plan:   Problem List Items Addressed This Visit      Other   Left knee pain    Continues to experience left knee pain most likely associated with ruptured Baker's cyst. Continue ice, compression, and over-the-counter medications as needed for symptom relief and supportive care. Refer to orthopedics for further evaluation assessment.      Relevant Orders   AMB referral to orthopedics   Hip pain - Primary    Continues to experience the associated symptom of right hip pain of questionable origin. X-rays were negative for arthritis or underlying pathology. Question muscle muscle origin cannot rule out gluteus medius. Refer to orthopedics for further assessment and evaluation. Follow-up if symptoms worsen or fail to improve.      Relevant Orders   AMB referral to orthopedics   Lightheadedness    Symptoms of lightheadedness most likely associated with dehydration as she drinks a fair amount of caffeine over the course of the day. Decrease caffeinated beverages. Encourage water. Change position slowly. Follow-up if symptoms worsen or fail to improve.        I have discontinued Ms. Ninneman's naproxen. I am also having her maintain her dicyclomine, Vitamin D-3, losartan, amLODipine, polycarbophil, metFORMIN, gabapentin, simvastatin, cloNIDine, carvedilol, ranitidine, and esomeprazole.

## 2015-08-21 NOTE — Progress Notes (Signed)
Pre visit review using our clinic review tool, if applicable. No additional management support is needed unless otherwise documented below in the visit note. 

## 2015-09-08 DIAGNOSIS — Z0000 Encounter for general adult medical examination without abnormal findings: Secondary | ICD-10-CM | POA: Diagnosis not present

## 2015-09-08 DIAGNOSIS — Z6827 Body mass index (BMI) 27.0-27.9, adult: Secondary | ICD-10-CM | POA: Diagnosis not present

## 2015-09-08 DIAGNOSIS — E1169 Type 2 diabetes mellitus with other specified complication: Secondary | ICD-10-CM | POA: Diagnosis not present

## 2015-09-10 ENCOUNTER — Telehealth: Payer: Self-pay | Admitting: Family

## 2015-09-10 LAB — FECAL OCCULT BLOOD, GUAIAC: Fecal Occult Blood: NEGATIVE

## 2015-09-10 NOTE — Telephone Encounter (Signed)
Patient called to advise that her insurance representative was at her house Saturday and stated that her bp was 168/98. She took another reading while we were on the phone and it was 132/81.

## 2015-09-10 NOTE — Telephone Encounter (Signed)
Noted. Please continue to take medications as prescribed and follow up if home blood pressure reading rise for a period of time.

## 2015-09-17 ENCOUNTER — Other Ambulatory Visit: Payer: Self-pay

## 2015-09-17 MED ORDER — GABAPENTIN 300 MG PO CAPS: 300.0000 mg | ORAL_CAPSULE | Freq: Three times a day (TID) | ORAL | Status: DC

## 2015-09-20 NOTE — Telephone Encounter (Signed)
Pt coming for an OV on 09/21/15

## 2015-09-21 ENCOUNTER — Encounter: Payer: Self-pay | Admitting: Family

## 2015-09-21 ENCOUNTER — Ambulatory Visit (INDEPENDENT_AMBULATORY_CARE_PROVIDER_SITE_OTHER): Payer: PPO | Admitting: Family

## 2015-09-21 VITALS — BP 144/92 | HR 83 | Temp 97.8°F | Resp 16 | Ht 63.0 in | Wt 169.0 lb

## 2015-09-21 DIAGNOSIS — M25562 Pain in left knee: Secondary | ICD-10-CM

## 2015-09-21 MED ORDER — AMLODIPINE BESYLATE 10 MG PO TABS: 10.0000 mg | ORAL_TABLET | Freq: Every day | ORAL | Status: DC

## 2015-09-21 MED ORDER — CARVEDILOL 3.125 MG PO TABS: 3.1250 mg | ORAL_TABLET | Freq: Two times a day (BID) | ORAL | Status: DC

## 2015-09-21 MED ORDER — LOSARTAN POTASSIUM 100 MG PO TABS: 100.0000 mg | ORAL_TABLET | Freq: Every day | ORAL | Status: DC

## 2015-09-21 MED ORDER — CLONIDINE HCL 0.1 MG PO TABS: 0.1000 mg | ORAL_TABLET | Freq: Two times a day (BID) | ORAL | Status: DC

## 2015-09-21 MED ORDER — RANITIDINE HCL 150 MG PO CAPS: 150.0000 mg | ORAL_CAPSULE | Freq: Two times a day (BID) | ORAL | Status: DC

## 2015-09-21 MED ORDER — METFORMIN HCL ER (MOD) 500 MG PO TB24: 500.0000 mg | ORAL_TABLET | Freq: Two times a day (BID) | ORAL | Status: DC

## 2015-09-21 NOTE — Progress Notes (Signed)
Subjective:    Patient ID: Meredith Phillips, female    DOB: [birth year], 70 y.o.   MRN: [id number]  Chief Complaint  Patient presents with  . Medication Refill    medication refills and wants to talk about getting fluid off her left knee    HPI:  Meredith Phillips is a 70 y.o. female who  has a past medical history of Diabetes mellitus without complication (HCC); GERD (gastroesophageal reflux disease); Hypercholesteremia; Hypertension; Diabetic peripheral neuropathy (HCC); Arthritis; Liver disease; Polyp of colon; Chicken pox; Benign essential HTN (02/18/2015); and Cellulitis (02/18/2015). and presents today for a follow up office visit.   1.) Left knee  - Previously noted to have a cyst located in the left knee. She continues to experience some swelling and edema for about 1 week. Modifying factors include ice which has helped a little. No fevers. No new trauma to the area or sounds/sensations heard or felt. Requesting an ultrasound of her knee to see if something can be done or needs to be done.   Allergies  Allergen Reactions  . Codeine   . Other     Doesn't like to take pain medication due to hallucinations.  [name] Shellfish Allergy      Current Outpatient Prescriptions on File Prior to Visit  Medication Sig Dispense Refill  . Cholecalciferol (VITAMIN D-3) 1000 UNITS CAPS Take 1,000 Units by mouth.    . dicyclomine (BENTYL) 10 MG capsule Take 10 mg by mouth as needed for spasms.    [name] esomeprazole (NEXIUM) 40 MG capsule Take 40 mg by mouth daily at 12 noon.    . gabapentin (NEURONTIN) 300 MG capsule Take 1 capsule (300 mg total) by mouth 3 (three) times daily. 90 capsule 2  . polycarbophil (FIBERCON) 625 MG tablet Take 625 mg by mouth.    . simvastatin (ZOCOR) 20 MG tablet Take 1 tablet (20 mg total) by mouth daily. 90 tablet 1   No current facility-administered medications on file prior to visit.    Review of Systems  Constitutional: Negative for fever and chills.  Musculoskeletal:         Positive for knee pain  Neurological: Negative for weakness and numbness.      Objective:    BP 144/92 mmHg  Pulse 83  Temp(Src) 97.8 F (36.6 C) (Oral)  Resp 16  Ht 5\' 3"  (1.6 m)  Wt 169 lb (76.658 kg)  BMI 29.94 kg/m2  SpO2 98% Nursing note and vital signs reviewed.  Physical Exam  Constitutional: She is oriented to person, place, and time. She appears well-developed and well-nourished. No distress.  Cardiovascular: Normal rate, regular rhythm, normal heart sounds and intact distal pulses.   Pulmonary/Chest: Effort normal and breath sounds normal.  Musculoskeletal:  Left knee - mild edema with no obvious deformity or discoloration. Mild generalized tenderness noted. Range of motion is within normal limits strength is 4+. Distal pulses and sensation are intact and appropriate. Ligamentous and meniscal testing are negative.  Neurological: She is alert and oriented to person, place, and time.  Skin: Skin is warm and dry.  Psychiatric: She has a normal mood and affect. Her behavior is normal. Judgment and thought content normal.   Examination: Ultrasound of knee Date:  09/21/2015 Patient Name: Meredith Phillips Arcelia Jew History:  Left knee discomfort; edema Findings:  The extensor mechanism, including the quadriceps tendon, patella, and patellar tendon is normal without bursal abnormalities.  There is mild effusion of the knee joint.  No significant  synovial hypertrophy. The medial collateral and lateral collateral ligaments are normal. Unremarkable iliotibial tract, biceps Morse, popliteus tendon, and common peroneal nerve. No Baker cyst. Limited evaluation of the menisci is unremarkable.  Impression:  Mild knee effusion, otherwise normal exam.        Full images located under Media tab.  Ultrasound was ordered, performed and interrupted by Marcos Eke, FNP       Assessment & Plan:   Problem List Items Addressed This Visit      Other   Left knee pain - Primary    Mild knee  effusion with no other underlying pathology noted on ultrasound. Previously received a cortisone injection approximately 2 weeks ago. Continue conservative therapy with ice and home exercise therapy. Follow-up if symptoms worsen or fail to improve.      Relevant Orders   [place] Extrem Low Left Ltd       I have changed Ms. Najera's amLODipine, metFORMIN, losartan, and ranitidine. I am also having her maintain her dicyclomine, Vitamin D-3, polycarbophil, simvastatin, esomeprazole, gabapentin, carvedilol, and cloNIDine.   Meds ordered this encounter  Medications  . amLODipine (NORVASC) 10 MG tablet    Sig: Take 1 tablet (10 mg total) by mouth daily.    Dispense:  90 tablet    Refill:  1  . carvedilol (COREG) 3.125 MG tablet    Sig: Take 1 tablet (3.125 mg total) by mouth 2 (two) times daily with a meal.    Dispense:  60 tablet    Refill:  5  . cloNIDine (CATAPRES) 0.1 MG tablet    Sig: Take 1 tablet (0.1 mg total) by mouth 2 (two) times daily.    Dispense:  60 tablet    Refill:  5  . metFORMIN (GLUMETZA) 500 MG (MOD) 24 hr tablet    Sig: Take 1 tablet (500 mg total) by mouth 2 (two) times daily.    Dispense:  180 tablet    Refill:  1  . losartan (COZAAR) 100 MG tablet    Sig: Take 1 tablet (100 mg total) by mouth daily.    Dispense:  90 tablet    Refill:  1  . ranitidine (ZANTAC) 150 MG capsule    Sig: Take 1 capsule (150 mg total) by mouth 2 (two) times daily.    Dispense:  60 capsule    Refill:  0     Follow-up: Return if symptoms worsen or fail to improve.  Jeanine Luz, FNP

## 2015-09-21 NOTE — Progress Notes (Signed)
Pre visit review using our clinic review tool, if applicable. No additional management support is needed unless otherwise documented below in the visit note. 

## 2015-09-21 NOTE — Assessment & Plan Note (Signed)
Mild knee effusion with no other underlying pathology noted on ultrasound. Previously received a cortisone injection approximately 2 weeks ago. Continue conservative therapy with ice and home exercise therapy. Follow-up if symptoms worsen or fail to improve.

## 2015-09-21 NOTE — Patient Instructions (Signed)
Thank you for choosing [facility].  Summary/Instructions:  Continue your ice as need for your knee  Compression sleeve as needed.   Your prescription(s) have been submitted to your pharmacy or been printed and provided for you. Please take as directed and contact our office if you believe you are having problem(s) with the medication(s) or have any questions.  If your symptoms worsen or fail to improve, please contact our office for further instruction, or in case of emergency go directly to the emergency room at the closest medical facility.

## 2015-10-04 ENCOUNTER — Encounter: Payer: Self-pay | Admitting: Family

## 2015-10-12 ENCOUNTER — Other Ambulatory Visit (INDEPENDENT_AMBULATORY_CARE_PROVIDER_SITE_OTHER): Payer: PPO

## 2015-10-12 ENCOUNTER — Encounter: Payer: Self-pay | Admitting: Family

## 2015-10-12 ENCOUNTER — Ambulatory Visit (INDEPENDENT_AMBULATORY_CARE_PROVIDER_SITE_OTHER): Payer: PPO | Admitting: Family

## 2015-10-12 VITALS — BP 128/80 | HR 92 | Temp 97.9°F | Resp 16 | Ht 63.0 in | Wt 168.0 lb

## 2015-10-12 DIAGNOSIS — M1712 Unilateral primary osteoarthritis, left knee: Secondary | ICD-10-CM

## 2015-10-12 DIAGNOSIS — R252 Cramp and spasm: Secondary | ICD-10-CM | POA: Insufficient documentation

## 2015-10-12 DIAGNOSIS — M129 Arthropathy, unspecified: Secondary | ICD-10-CM

## 2015-10-12 LAB — BASIC METABOLIC PANEL
BUN: 8 mg/dL (ref 6–23)
CALCIUM: 10.6 mg/dL — AB (ref 8.4–10.5)
CO2: 28 mEq/L (ref 19–32)
CREATININE: 0.87 mg/dL (ref 0.40–1.20)
Chloride: 104 mEq/L (ref 96–112)
GFR: 82.24 mL/min (ref >60.00)
Glucose, Bld: 115 mg/dL — ABNORMAL HIGH (ref 70–99)
Potassium: 3.8 mEq/L (ref 3.5–5.1)
Sodium: 138 mEq/L (ref 135–145)

## 2015-10-12 LAB — PHOSPHORUS: Phosphorus: 3.6 mg/dL (ref 2.3–4.6)

## 2015-10-12 LAB — MAGNESIUM: MAGNESIUM: 1.8 mg/dL (ref 1.5–2.5)

## 2015-10-12 NOTE — Progress Notes (Signed)
Pre visit review using our clinic review tool, if applicable. No additional management support is needed unless otherwise documented below in the visit note. 

## 2015-10-12 NOTE — Assessment & Plan Note (Signed)
Muscle cramping with concern for electrolyte imbalance or claudication. Obtain BMET, magnesium, and phosphorus. Ankle brachial index ordered. Encouraged ice and stretching pending test results.

## 2015-10-12 NOTE — Progress Notes (Signed)
Subjective:    Patient ID: Meredith Phillips, female    DOB: 1946-02-27, 70 y.o.   MRN: [id number]  Chief Complaint  Patient presents with  . Leg issues    knee brace and left leg is getting more cramps,     HPI:  Meredith Phillips is a 70 y.o. female who  has a past medical history of Diabetes mellitus without complication (HCC); GERD (gastroesophageal reflux disease); Hypercholesteremia; Hypertension; Diabetic peripheral neuropathy (HCC); Arthritis; Liver disease; Polyp of colon; Chicken pox; Benign essential HTN (02/18/2015); and Cellulitis (02/18/2015). and presents today for a follow up office visit.  Continues to experience the associated symptom of pain located in her left knee and leg that has been going on for several months. Previously injected with cortisone which has not helped significantly. Endorses that she continues to experience additional cramps in her legs compared to before. Leg cramps have generally occurred when she is walking and relieved with rest. She is also in need of a knee brace to help with her discomofrt.   Allergies  Allergen Reactions  . Codeine   . Other     Doesn't like to take pain medication due to hallucinations.  [name] Shellfish Allergy     Current Outpatient Prescriptions on File Prior to Visit  Medication Sig Dispense Refill  . amLODipine (NORVASC) 10 MG tablet Take 1 tablet (10 mg total) by mouth daily. 90 tablet 1  . carvedilol (COREG) 3.125 MG tablet Take 1 tablet (3.125 mg total) by mouth 2 (two) times daily with a meal. 60 tablet 5  . Cholecalciferol (VITAMIN D-3) 1000 UNITS CAPS Take 1,000 Units by mouth.    . cloNIDine (CATAPRES) 0.1 MG tablet Take 1 tablet (0.1 mg total) by mouth 2 (two) times daily. 60 tablet 5  . dicyclomine (BENTYL) 10 MG capsule Take 10 mg by mouth as needed for spasms.    [name] esomeprazole (NEXIUM) 40 MG capsule Take 40 mg by mouth daily at 12 noon.    . gabapentin (NEURONTIN) 300 MG capsule Take 1 capsule (300 mg total) by  mouth 3 (three) times daily. 90 capsule 2  . losartan (COZAAR) 100 MG tablet Take 1 tablet (100 mg total) by mouth daily. 90 tablet 1  . metFORMIN (GLUMETZA) 500 MG (MOD) 24 hr tablet Take 1 tablet (500 mg total) by mouth 2 (two) times daily. 180 tablet 1  . polycarbophil (FIBERCON) 625 MG tablet Take 625 mg by mouth.    . ranitidine (ZANTAC) 150 MG capsule Take 1 capsule (150 mg total) by mouth 2 (two) times daily. 60 capsule 0  . simvastatin (ZOCOR) 20 MG tablet Take 1 tablet (20 mg total) by mouth daily. 90 tablet 1   No current facility-administered medications on file prior to visit.    Review of Systems  Constitutional: Negative for fever and chills.  Respiratory: Negative for chest tightness and shortness of breath.   Cardiovascular: Negative for chest pain, palpitations and leg swelling.      Objective:    BP 128/80 mmHg  Pulse 92  Temp(Src) 97.9 F (36.6 C) (Oral)  Resp 16  Ht 5\' 3"  (1.6 m)  Wt 168 lb (76.204 kg)  BMI 29.77 kg/m2  SpO2 98% Nursing note and vital signs reviewed.  Physical Exam  Constitutional: She is oriented to person, place, and time. She appears well-developed and well-nourished. No distress.  Cardiovascular: Normal rate, regular rhythm, normal heart sounds and intact distal pulses.   Pulmonary/Chest: Effort normal and  breath sounds normal.  Musculoskeletal:  Bilateral lower extremities - no obvious deformity, discoloration, or edema. No calf cramps, masses, crepitus or tenderness. Range of motion of the knee and ankle are normal. Strength is 4+ for the knee and ankle. Mild discomfort of left knee. Homans sign negative  Neurological: She is alert and oriented to person, place, and time.  Skin: Skin is warm and dry.  Psychiatric: She has a normal mood and affect. Her behavior is normal. Judgment and thought content normal.       Assessment & Plan:   Problem List Items Addressed This Visit      Musculoskeletal and Integument   Arthritis of left  knee    Symptoms consistent with flare of osteoarthritis. Continue ice, home exercise therapy, and recommend follow up with orthopedics for further treatment.         Other   Muscle cramping - Primary    Muscle cramping with concern for electrolyte imbalance or claudication. Obtain BMET, magnesium, and phosphorus. Ankle brachial index ordered. Encouraged ice and stretching pending test results.       Relevant Orders   ABI   Basic Metabolic Panel (BMET)   Magnesium   Phosphorus       I am having Ms. Varricchio maintain her dicyclomine, Vitamin D-3, polycarbophil, simvastatin, esomeprazole, gabapentin, amLODipine, carvedilol, cloNIDine, metFORMIN, losartan, and ranitidine.   Follow-up: Return if symptoms worsen or fail to improve.  Jeanine Luz, FNP

## 2015-10-12 NOTE — Patient Instructions (Signed)
Thank you for choosing [facility].  Summary/Instructions:  Please continue to take your medications as prescribed.  Your blood pressure looks great!  They will call to schedule the test of your lower extremity circulation.   Please stop by the lab on the basement level of the building for your blood work. Your results will be released to MyChart (or called to you) after review, usually within 72 hours after test completion. If any changes need to be made, you will be notified at that same time.  If your symptoms worsen or fail to improve, please contact our office for further instruction, or in case of emergency go directly to the emergency room at the closest medical facility.

## 2015-10-12 NOTE — Assessment & Plan Note (Signed)
Symptoms consistent with flare of osteoarthritis. Continue ice, home exercise therapy, and recommend follow up with orthopedics for further treatment.

## 2015-10-14 ENCOUNTER — Telehealth: Payer: Self-pay | Admitting: Family

## 2015-10-14 NOTE — Telephone Encounter (Signed)
Please inform patient that her kidney function and electrolytes are normal with no reason for the muscle cramping. If she is taking any calcium supplement, please have her reduce the dose or go to every other day. Otherwise follow up with the ankle-brachial index as we discussed.

## 2015-10-18 NOTE — Telephone Encounter (Signed)
Pt aware of results 

## 2015-10-29 ENCOUNTER — Telehealth: Payer: Self-pay | Admitting: Family

## 2015-10-29 NOTE — Telephone Encounter (Signed)
Please inform patient that her stool sample was negative for blood.

## 2015-11-09 NOTE — Telephone Encounter (Signed)
Pt aware of results 

## 2015-11-13 ENCOUNTER — Ambulatory Visit (INDEPENDENT_AMBULATORY_CARE_PROVIDER_SITE_OTHER): Payer: PPO | Admitting: Internal Medicine

## 2015-11-13 ENCOUNTER — Encounter: Payer: Self-pay | Admitting: Internal Medicine

## 2015-11-13 VITALS — BP 124/82 | HR 86 | Temp 98.5°F | Resp 16 | Wt 169.0 lb

## 2015-11-13 DIAGNOSIS — R21 Rash and other nonspecific skin eruption: Secondary | ICD-10-CM | POA: Diagnosis not present

## 2015-11-13 MED ORDER — CLOBETASOL PROPIONATE 0.05 % EX CREA: 1.0000 "application " | TOPICAL_CREAM | Freq: Two times a day (BID) | CUTANEOUS | Status: DC

## 2015-11-13 NOTE — Progress Notes (Signed)
Pre visit review using our clinic review tool, if applicable. No additional management support is needed unless otherwise documented below in the visit note. 

## 2015-11-13 NOTE — Assessment & Plan Note (Signed)
Possibly allergic in nature  Topical steroid cream has been effective - will give her a new prescription - the cream she is using is expired Deferred seeing an allergist at this time Advised her to monitor for any possible causes - food, etc since it is unlikely to be related to her medications.

## 2015-11-13 NOTE — Progress Notes (Signed)
Subjective:    Patient ID: Meredith Phillips, female    DOB: 13-Feb-1946, 70 y.o.   MRN: [id number]  HPI She is here for an acute visit.   Rash:  She has had a itchy rash on her hands and arms for three days.  She has some allergies and has had rashes in the past.  She has always thought it was related to one of her medications, but she only gets the rash less than once a month.  She uses a topical cream that works well, but it is old and she wonders if she needs a new one.   She is unsure if she is allergic to any foods or products.  She has been offered to see an allergist in the past, but would prefer to just deal with it.  She denies fever, difficulty swallowing or swelling in her throat.     Medications and allergies reviewed with patient and updated if appropriate.  Patient Active Problem List   Diagnosis Date Noted  . Muscle cramping 10/12/2015  . Lightheadedness 08/21/2015  . GERD (gastroesophageal reflux disease) 08/06/2015  . Baker's cyst, ruptured 08/03/2015  . Hip pain 06/29/2015  . Type 2 diabetes mellitus (HCC) 06/29/2015  . Arthritis of left knee 06/15/2015  . Left knee pain 06/09/2015  . Pain of left side of body 02/22/2015  . Benign essential HTN 02/18/2015  . Cellulitis 02/18/2015  . Sciatica 01/17/2015  . Routine general medical examination at a health care facility 12/26/2014  . Medicare annual wellness visit, subsequent 12/26/2014    Current Outpatient Prescriptions on File Prior to Visit  Medication Sig Dispense Refill  . amLODipine (NORVASC) 10 MG tablet Take 1 tablet (10 mg total) by mouth daily. 90 tablet 1  . carvedilol (COREG) 3.125 MG tablet Take 1 tablet (3.125 mg total) by mouth 2 (two) times daily with a meal. 60 tablet 5  . Cholecalciferol (VITAMIN D-3) 1000 UNITS CAPS Take 1,000 Units by mouth.    . cloNIDine (CATAPRES) 0.1 MG tablet Take 1 tablet (0.1 mg total) by mouth 2 (two) times daily. 60 tablet 5  . dicyclomine (BENTYL) 10 MG capsule Take 10  mg by mouth as needed for spasms.    [name] esomeprazole (NEXIUM) 40 MG capsule Take 40 mg by mouth daily at 12 noon.    . gabapentin (NEURONTIN) 300 MG capsule Take 1 capsule (300 mg total) by mouth 3 (three) times daily. 90 capsule 2  . losartan (COZAAR) 100 MG tablet Take 1 tablet (100 mg total) by mouth daily. 90 tablet 1  . metFORMIN (GLUMETZA) 500 MG (MOD) 24 hr tablet Take 1 tablet (500 mg total) by mouth 2 (two) times daily. 180 tablet 1  . polycarbophil (FIBERCON) 625 MG tablet Take 625 mg by mouth.    . ranitidine (ZANTAC) 150 MG capsule Take 1 capsule (150 mg total) by mouth 2 (two) times daily. 60 capsule 0  . simvastatin (ZOCOR) 20 MG tablet Take 1 tablet (20 mg total) by mouth daily. 90 tablet 1   No current facility-administered medications on file prior to visit.    Past Medical History  Diagnosis Date  . Diabetes mellitus without complication (HCC)   . GERD (gastroesophageal reflux disease)   . Hypercholesteremia   . Hypertension   . Diabetic peripheral neuropathy (HCC)   . Arthritis   . Liver disease   . Polyp of colon   . Chicken pox   . Benign essential HTN 02/18/2015  .  Cellulitis 02/18/2015    Past Surgical History  Procedure Laterality Date  . Hernia repair    . Rotator cuff repair    . Knee surgery    . Appendectomy    . Tonsillectomy      Social History   Social History  . Marital Status: Married    Spouse Name: N/A  . Number of Children: 5  . Years of Education: 12   Occupational History  . Retired    Social History Main Topics  . Smoking status: Never Smoker   . Smokeless tobacco: Never Used  . Alcohol Use: No  . Drug Use: No  . Sexual Activity: Not on file   Other Topics Concern  . Not on file   Social History Narrative   Fun: paint, word search   Feels safe where she is living and no concern for abuse   Denies religious beliefs effecting healthcare.     Family History  Problem Relation Age of Onset  . Healthy Mother   . Healthy  Father   . Healthy Paternal Grandmother   . Healthy Paternal Grandfather     Review of Systems  Constitutional: Negative for fever and chills.  HENT: Negative for sore throat and trouble swallowing.   Respiratory: Negative for cough and shortness of breath.        Objective:   Filed Vitals:   11/13/15 1023  BP: 124/82  Pulse: 86  Temp: 98.5 F (36.9 C)  Resp: 16   Filed Weights   11/13/15 1023  Weight: 169 lb (76.658 kg)   Body mass index is 29.94 kg/(m^2).   Physical Exam  Constitutional: She appears well-developed and well-nourished. No distress.  Skin: Skin is warm and dry. Rash (mild rash with several very small papules on posterior hands and forearms) noted. She is not diaphoretic.       Assessment & Plan:   See Problem List for Assessment and Plan of chronic medical problems.

## 2015-11-13 NOTE — Patient Instructions (Signed)
You likely have an allergic rash.   Medications reviewed and updated.  Changes include trying a steroid cream as needed for for your rash.    Your prescription(s) have been submitted to your pharmacy. Please take as directed and contact our office if you believe you are having problem(s) with the medication(s).

## 2015-12-10 ENCOUNTER — Encounter: Payer: Self-pay | Admitting: Family

## 2015-12-10 ENCOUNTER — Ambulatory Visit (INDEPENDENT_AMBULATORY_CARE_PROVIDER_SITE_OTHER): Payer: PPO | Admitting: Family

## 2015-12-10 VITALS — BP 124/84 | HR 67 | Temp 98.0°F | Resp 16 | Ht 63.0 in | Wt 166.0 lb

## 2015-12-10 DIAGNOSIS — M129 Arthropathy, unspecified: Secondary | ICD-10-CM

## 2015-12-10 DIAGNOSIS — M1712 Unilateral primary osteoarthritis, left knee: Secondary | ICD-10-CM

## 2015-12-10 MED ORDER — TRAMADOL HCL 50 MG PO TABS: 50.0000 mg | ORAL_TABLET | Freq: Three times a day (TID) | ORAL | Status: DC | PRN

## 2015-12-10 MED ORDER — ESOMEPRAZOLE MAGNESIUM 40 MG PO CPDR: 40.0000 mg | DELAYED_RELEASE_CAPSULE | Freq: Every day | ORAL | Status: DC

## 2015-12-10 NOTE — Progress Notes (Signed)
Subjective:    Patient ID: SOL ODOR, female    DOB: 08-24-1945, 70 y.o.   MRN: [id number]  Chief Complaint  Patient presents with  . Knee Pain    has left knee pain that comes and goes, swelling and was told she has cysts in it    HPI:  Meredith Phillips is a 70 y.o. female who  has a past medical history of Diabetes mellitus without complication (HCC); GERD (gastroesophageal reflux disease); Hypercholesteremia; Hypertension; Diabetic peripheral neuropathy (HCC); Arthritis; Liver disease; Polyp of colon; Chicken pox; Benign essential HTN (02/18/2015); and Cellulitis (02/18/2015). and presents today for a follow up office visit.   Continues to experience the associated symptom of pain located in her left knee that continues to wax and wane. Has been seen by orthopedics with the recommendation of a total knee replacement. Pain is described as sharp.  Modifying factors include cold/heat as well as home therapy which has not helped. Previously failure of cortisone injection with minimal relief. Has not done physical therapy to this point.   Allergies  Allergen Reactions  . Codeine   . Other     Doesn't like to take pain medication due to hallucinations.  [name] Shellfish Allergy      Current Outpatient Prescriptions on File Prior to Visit  Medication Sig Dispense Refill  . amLODipine (NORVASC) 10 MG tablet Take 1 tablet (10 mg total) by mouth daily. 90 tablet 1  . carvedilol (COREG) 3.125 MG tablet Take 1 tablet (3.125 mg total) by mouth 2 (two) times daily with a meal. 60 tablet 5  . Cholecalciferol (VITAMIN D-3) 1000 UNITS CAPS Take 1,000 Units by mouth.    . clobetasol cream (TEMOVATE) 0.05 % Apply 1 application topically 2 (two) times daily. Do not use for more than two weeks at one time 30 g 2  . cloNIDine (CATAPRES) 0.1 MG tablet Take 1 tablet (0.1 mg total) by mouth 2 (two) times daily. 60 tablet 5  . dicyclomine (BENTYL) 10 MG capsule Take 10 mg by mouth as needed for spasms.    [name]  gabapentin (NEURONTIN) 300 MG capsule Take 1 capsule (300 mg total) by mouth 3 (three) times daily. 90 capsule 2  . losartan (COZAAR) 100 MG tablet Take 1 tablet (100 mg total) by mouth daily. 90 tablet 1  . metFORMIN (GLUMETZA) 500 MG (MOD) 24 hr tablet Take 1 tablet (500 mg total) by mouth 2 (two) times daily. 180 tablet 1  . polycarbophil (FIBERCON) 625 MG tablet Take 625 mg by mouth.    . simvastatin (ZOCOR) 20 MG tablet Take 1 tablet (20 mg total) by mouth daily. 90 tablet 1   No current facility-administered medications on file prior to visit.     Review of Systems  Constitutional: Negative for fever and chills.  Musculoskeletal:       Positive for knee pain.       Objective:    BP 124/84 mmHg  Pulse 67  Temp(Src) 98 F (36.7 C) (Oral)  Resp 16  Ht 5\' 3"  (1.6 m)  Wt 166 lb (75.297 kg)  BMI 29.41 kg/m2  SpO2 98% Nursing note and vital signs reviewed.  Physical Exam  Constitutional: She is oriented to person, place, and time. She appears well-developed and well-nourished. No distress.  Cardiovascular: Normal rate, regular rhythm, normal heart sounds and intact distal pulses.   Pulmonary/Chest: Effort normal and breath sounds normal.  Musculoskeletal:  Left knee - mild edema with no obvious deformity  or discoloration. Mild generalized tenderness noted. Range of motion is within normal limits strength is 4+. Distal pulses and sensation are intact and appropriate. Ligamentous and meniscal testing are negative.   Neurological: She is alert and oriented to person, place, and time.  Skin: Skin is warm and dry.  Psychiatric: She has a normal mood and affect. Her behavior is normal. Judgment and thought content normal.       Assessment & Plan:   Problem List Items Addressed This Visit      Musculoskeletal and Integument   Arthritis of left knee - Primary    Symptoms and exam remain consistent with osteoarthritis with the recommendations for knee replacement by orthopedic  surgery. Declines physical therapy. Unlikely candidate for Synvisc. Continue conservative treatment with ice/heat and home exercise therapy. Start Tramadol as needed for discomfort. Follow up with orthopedic surgery as planned.           I have discontinued Meredith Phillips's ranitidine. I have also changed her esomeprazole. Additionally, I am having her start on traMADol. Lastly, I am having her maintain her dicyclomine, Vitamin D-3, polycarbophil, simvastatin, gabapentin, amLODipine, carvedilol, cloNIDine, metFORMIN, losartan, and clobetasol cream.   Meds ordered this encounter  Medications  . esomeprazole (NEXIUM) 40 MG capsule    Sig: Take 1 capsule (40 mg total) by mouth daily at 12 noon.    Dispense:  90 capsule    Refill:  0  . traMADol (ULTRAM) 50 MG tablet    Sig: Take 1 tablet (50 mg total) by mouth every 8 (eight) hours as needed.    Dispense:  60 tablet    Refill:  0    Order Specific Question:  Supervising Provider    Answer:  Hillard Danker A [[id number]]     Follow-up: Return if symptoms worsen or fail to improve.  Meredith Luz, FNP

## 2015-12-10 NOTE — Progress Notes (Signed)
Pre visit review using our clinic review tool, if applicable. No additional management support is needed unless otherwise documented below in the visit note. 

## 2015-12-10 NOTE — Patient Instructions (Addendum)
Thank you for choosing [facility].  Summary/Instructions:  Please start tramadol as needed for discomfort.   Tumeric 500 mg daily for inflammation.  Consider physical therapy.  Continue ice/heat and home exercise therapy.  Continue Vitamin D.   Follow up with Dr. Linna Caprice.   Your prescription(s) have been submitted to your pharmacy or been printed and provided for you. Please take as directed and contact our office if you believe you are having problem(s) with the medication(s) or have any questions.  If your symptoms worsen or fail to improve, please contact our office for further instruction, or in case of emergency go directly to the emergency room at the closest medical facility.   Osteoarthritis Osteoarthritis is a disease that causes soreness and inflammation of a joint. It occurs when the cartilage at the affected joint wears down. Cartilage acts as a cushion, covering the ends of bones where they meet to form a joint. Osteoarthritis is the most common form of arthritis. It often occurs in older people. The joints affected most often by this condition include those in the:  Ends of the fingers.  Thumbs.  Neck.  Lower back.  Knees.  Hips. CAUSES  Over time, the cartilage that covers the ends of bones begins to wear away. This causes bone to rub on bone, producing pain and stiffness in the affected joints.  RISK FACTORS Certain factors can increase your chances of having osteoarthritis, including:  Older age.  Excessive body weight.  Overuse of joints.  Previous joint injury. SIGNS AND SYMPTOMS   Pain, swelling, and stiffness in the joint.  Over time, the joint may lose its normal shape.  Small deposits of bone (osteophytes) may grow on the edges of the joint.  Bits of bone or cartilage can break off and float inside the joint space. This may cause more pain and damage. DIAGNOSIS  Your health care provider will do a physical exam and ask about your  symptoms. Various tests may be ordered, such as:  X-rays of the affected joint.  Blood tests to rule out other types of arthritis. Additional tests may be used to diagnose your condition. TREATMENT  Goals of treatment are to control pain and improve joint function. Treatment plans may include:  A prescribed exercise program that allows for rest and joint relief.  A weight control plan.  Pain relief techniques, such as:  Properly applied heat and cold.  Electric pulses delivered to nerve endings under the skin (transcutaneous electrical nerve stimulation [TENS]).  Massage.  Certain nutritional supplements.  Medicines to control pain, such as:  Acetaminophen.  Nonsteroidal anti-inflammatory drugs (NSAIDs), such as naproxen.  Narcotic or central-acting agents, such as tramadol.  Corticosteroids. These can be given orally or as an injection.  Surgery to reposition the bones and relieve pain (osteotomy) or to remove loose pieces of bone and cartilage. Joint replacement may be needed in advanced states of osteoarthritis. HOME CARE INSTRUCTIONS   Take medicines only as directed by your health care provider.  Maintain a healthy weight. Follow your health care provider's instructions for weight control. This may include dietary instructions.  Exercise as directed. Your health care provider can recommend specific types of exercise. These may include:  Strengthening exercises. These are done to strengthen the muscles that support joints affected by arthritis. They can be performed with weights or with exercise bands to add resistance.  Aerobic activities. These are exercises, such as brisk walking or low-impact aerobics, that get your heart pumping.  Range-of-motion  activities. These keep your joints limber.  Balance and agility exercises. These help you maintain daily living skills.  Rest your affected joints as directed by your health care provider.  Keep all follow-up  visits as directed by your health care provider. SEEK MEDICAL CARE IF:   Your skin turns red.  You develop a rash in addition to your joint pain.  You have worsening joint pain.  You have a fever along with joint or muscle aches. SEEK IMMEDIATE MEDICAL CARE IF:  You have a significant loss of weight or appetite.  You have night sweats. FOR MORE INFORMATION   National Institute of Arthritis and Musculoskeletal and Skin Diseases: www.niams.http://www.myers.net/  [employer] on Aging: https://walker.com/  American College of Rheumatology: www.rheumatology.org   This information is not intended to replace advice given to you by your health care provider. Make sure you discuss any questions you have with your health care provider.   Document Released: 06/02/2005 Document Revised: 06/23/2014 Document Reviewed: 02/07/2013 Elsevier Interactive Patient Education [employer]

## 2015-12-10 NOTE — Assessment & Plan Note (Signed)
Symptoms and exam remain consistent with osteoarthritis with the recommendations for knee replacement by orthopedic surgery. Declines physical therapy. Unlikely candidate for Synvisc. Continue conservative treatment with ice/heat and home exercise therapy. Start Tramadol as needed for discomfort. Follow up with orthopedic surgery as planned.

## 2015-12-17 ENCOUNTER — Other Ambulatory Visit: Payer: Self-pay

## 2015-12-17 MED ORDER — RANITIDINE HCL 150 MG PO TABS: 150.0000 mg | ORAL_TABLET | Freq: Two times a day (BID) | ORAL | Status: DC

## 2015-12-21 ENCOUNTER — Encounter: Payer: Self-pay | Admitting: Family

## 2015-12-26 ENCOUNTER — Telehealth: Payer: Self-pay | Admitting: Vascular Surgery

## 2015-12-26 NOTE — Telephone Encounter (Signed)
-----   Message from Veryl Speak, FNP sent at 12/25/2015 11:27 PM EDT ----- Regarding: RE: Order for ABI The patient did not mention it during our most recent office visit. We can cancel it now and I will reorder if indicated.  Thanks.  Tammy Sours ----- Message -----    From: Rudean Haskell    Sent: 12/24/2015   8:49 AM      To: Veryl Speak, FNP Subject: Order for ABI                                  On Friday afternoon an order for ABI's showed up in the [facility]  - [address] work queue from April 28th.   Do you still want this scheduled?  It doesn't appear to have been performed.     Juliette Alcide

## 2016-01-03 ENCOUNTER — Other Ambulatory Visit: Payer: Self-pay | Admitting: Family

## 2016-01-08 ENCOUNTER — Telehealth: Payer: Self-pay | Admitting: Emergency Medicine

## 2016-01-08 MED ORDER — LOSARTAN POTASSIUM 100 MG PO TABS: 100.0000 mg | ORAL_TABLET | Freq: Every day | ORAL | 1 refills | Status: DC

## 2016-01-08 MED ORDER — AMLODIPINE BESYLATE 10 MG PO TABS: 10.0000 mg | ORAL_TABLET | Freq: Every day | ORAL | 1 refills | Status: DC

## 2016-01-08 NOTE — Telephone Encounter (Signed)
Medications have been filled

## 2016-01-08 NOTE — Telephone Encounter (Signed)
Pt needs prescriptions refills for amLODipine (NORVASC) 10 MG tablet and losartan (COZAAR) 100 MG tablet. Pharmacy is [facility] [address] [facility] in [place]. Please follow up thanks.

## 2016-01-17 ENCOUNTER — Other Ambulatory Visit: Payer: Self-pay | Admitting: Family

## 2016-01-30 ENCOUNTER — Other Ambulatory Visit: Payer: Self-pay | Admitting: Family

## 2016-02-05 ENCOUNTER — Encounter: Payer: Self-pay | Admitting: Family

## 2016-02-05 ENCOUNTER — Ambulatory Visit (INDEPENDENT_AMBULATORY_CARE_PROVIDER_SITE_OTHER): Payer: PPO | Admitting: Family

## 2016-02-05 DIAGNOSIS — N644 Mastodynia: Secondary | ICD-10-CM | POA: Insufficient documentation

## 2016-02-05 DIAGNOSIS — S060X9A Concussion with loss of consciousness of unspecified duration, initial encounter: Secondary | ICD-10-CM | POA: Insufficient documentation

## 2016-02-05 DIAGNOSIS — S060XAA Concussion with loss of consciousness status unknown, initial encounter: Secondary | ICD-10-CM | POA: Insufficient documentation

## 2016-02-05 DIAGNOSIS — S060X0A Concussion without loss of consciousness, initial encounter: Secondary | ICD-10-CM

## 2016-02-05 NOTE — Assessment & Plan Note (Addendum)
Mild with no loss of consciousness. Symptoms and exam consistent with concussion which appears to be resolving with no further residual effects given her normal neurological exam. Recommend Fish oil for the next 2 weeks. Tylenol as needed for headache. Follow-up if symptoms worsen or do not improve.

## 2016-02-05 NOTE — Assessment & Plan Note (Signed)
Breast with no current tenderness x 2 weeks. Previous mammograms negative. Recommend continue to monitor and follow up if symptoms return.

## 2016-02-05 NOTE — Progress Notes (Signed)
Subjective:    Patient ID: Meredith Phillips, female    DOB: [birth year], 70 y.o.   MRN: [id number]  Chief Complaint  Patient presents with  . Breast Pain    right breast pain, tender to touch,     HPI:  Meredith Phillips is a 70 y.o. female who  has a past medical history of Arthritis; Benign essential HTN (02/18/2015); Cellulitis (02/18/2015); Chicken pox; Diabetes mellitus without complication (HCC); Diabetic peripheral neuropathy (HCC); GERD (gastroesophageal reflux disease); Hypercholesteremia; Hypertension; Liver disease; and Polyp of colon. and presents today for an office visit.   1.) Breast pain - This is a new problem. Associated symptom of pain located in her right breast along the right side. No masses, changes in skin, or nipple inversion. Pain is described as coming and going and is sharp in nature. Pain generally lasts for a little while. No chest pain with exertion. Notes it has happened about 3 times with the most recent being about 2 weeks ago.   2.) Head pain - This is a new problem. Associated symptom of bump located on her head has been going on for following striking her head a couple of times on the corner of a shelf. Notes that she did have swimmy headed feelings. Does have occasional headache. Pain is described as sharp. Modifying factors include walking around and doing something. No dizziness, changes vision/concentration, nausea or vomiting. Generally waxes and wanes. Denies worst headache of life.    Allergies  Allergen Reactions  . Codeine   . Other     Doesn't like to take pain medication due to hallucinations.  [name] Shellfish Allergy      Current Outpatient Prescriptions on File Prior to Visit  Medication Sig Dispense Refill  . amLODipine (NORVASC) 10 MG tablet Take 1 tablet (10 mg total) by mouth daily. 90 tablet 1  . carvedilol (COREG) 3.125 MG tablet Take 1 tablet (3.125 mg total) by mouth 2 (two) times daily with a meal. 60 tablet 5  . Cholecalciferol (VITAMIN  D-3) 1000 UNITS CAPS Take 1,000 Units by mouth.    . clobetasol cream (TEMOVATE) 0.05 % Apply 1 application topically 2 (two) times daily. Do not use for more than two weeks at one time 30 g 2  . cloNIDine (CATAPRES) 0.1 MG tablet Take 1 tablet (0.1 mg total) by mouth 2 (two) times daily. 60 tablet 5  . dicyclomine (BENTYL) 10 MG capsule Take 10 mg by mouth as needed for spasms.    [name] esomeprazole (NEXIUM) 40 MG capsule Take 1 capsule (40 mg total) by mouth daily at 12 noon. 90 capsule 0  . gabapentin (NEURONTIN) 300 MG capsule TAKE 1 CAPSULE (300 MG TOTAL) BY MOUTH 3 (THREE) TIMES DAILY. 90 capsule 3  . losartan (COZAAR) 100 MG tablet Take 1 tablet (100 mg total) by mouth daily. 90 tablet 1  . metFORMIN (GLUMETZA) 500 MG (MOD) 24 hr tablet Take 1 tablet (500 mg total) by mouth 2 (two) times daily. 180 tablet 1  . polycarbophil (FIBERCON) 625 MG tablet Take 625 mg by mouth.    . ranitidine (ZANTAC) 150 MG tablet Take 1 tablet (150 mg total) by mouth 2 (two) times daily. 180 tablet 0  . simvastatin (ZOCOR) 20 MG tablet TAKE 1 TABLET BY MOUTH EVERY DAY 90 tablet 1  . traMADol (ULTRAM) 50 MG tablet Take 1 tablet (50 mg total) by mouth every 8 (eight) hours as needed. 60 tablet 0   No current facility-administered  medications on file prior to visit.     Review of Systems  Constitutional: Negative for chills and fever.  Respiratory: Negative for chest tightness and shortness of breath.   Neurological: Positive for headaches. Negative for dizziness and weakness.      Objective:    BP (!) 142/86 (BP Location: Left Arm, Patient Position: Sitting, Cuff Size: Normal)   Pulse 82   Temp 98.1 F (36.7 C) (Oral)   Resp 16   Ht 5\' 3"  (1.6 m)   Wt 164 lb (74.4 kg)   SpO2 98%   BMI 29.05 kg/m  Nursing note and vital signs reviewed.  Physical Exam  Constitutional: She is oriented to person, place, and time. She appears well-developed and well-nourished. No distress.  Eyes: Conjunctivae and EOM are  normal. Pupils are equal, round, and reactive to light.  Cardiovascular: Normal rate, regular rhythm, normal heart sounds and intact distal pulses.   Pulmonary/Chest: Effort normal and breath sounds normal.  Neurological: She is alert and oriented to person, place, and time. No cranial nerve deficit.  Skin: Skin is warm and dry.  Psychiatric: She has a normal mood and affect. Her behavior is normal. Judgment and thought content normal.       Assessment & Plan:   Problem List Items Addressed This Visit      Other   Concussion    Mild with no loss of consciousness. Symptoms and exam consistent with concussion which appears to be resolving with no further residual effects given her normal neurological exam. Recommend Fish oil for the next 2 weeks. Tylenol as needed for headache. Follow-up if symptoms worsen or do not improve.      Breast pain, right    Breast with no current tenderness x 2 weeks. Previous mammograms negative. Recommend continue to monitor and follow up if symptoms return.        Other Visit Diagnoses   None.     I am having Ms. Gearheart maintain her dicyclomine, Vitamin D-3, polycarbophil, carvedilol, cloNIDine, metFORMIN, clobetasol cream, esomeprazole, traMADol, ranitidine, amLODipine, losartan, simvastatin, and gabapentin.  Follow-up: Return if symptoms worsen or fail to improve.  , FNP

## 2016-02-05 NOTE — Patient Instructions (Signed)
Thank you for choosing [facility].  Summary/Instructions:  Please continue to take your medications as prescribed.   Start fish oil 1200 mg for the next couple of weeks to help with your head.  Continue to monitor your breast pain if it returns please let us know.  If your symptoms worsen or fail to improve, please contact our office for further instruction, or in case of emergency go directly to the emergency room at the closest medical facility.

## 2016-02-12 ENCOUNTER — Ambulatory Visit (INDEPENDENT_AMBULATORY_CARE_PROVIDER_SITE_OTHER): Payer: PPO | Admitting: Family

## 2016-02-12 ENCOUNTER — Other Ambulatory Visit (INDEPENDENT_AMBULATORY_CARE_PROVIDER_SITE_OTHER): Payer: PPO

## 2016-02-12 ENCOUNTER — Encounter: Payer: Self-pay | Admitting: Family

## 2016-02-12 VITALS — BP 134/86 | HR 73 | Temp 98.6°F | Resp 16 | Ht 63.0 in | Wt 161.0 lb

## 2016-02-12 DIAGNOSIS — Z7289 Other problems related to lifestyle: Secondary | ICD-10-CM | POA: Diagnosis not present

## 2016-02-12 DIAGNOSIS — I10 Essential (primary) hypertension: Secondary | ICD-10-CM

## 2016-02-12 DIAGNOSIS — E119 Type 2 diabetes mellitus without complications: Secondary | ICD-10-CM

## 2016-02-12 DIAGNOSIS — Z780 Asymptomatic menopausal state: Secondary | ICD-10-CM

## 2016-02-12 DIAGNOSIS — Z0000 Encounter for general adult medical examination without abnormal findings: Secondary | ICD-10-CM | POA: Diagnosis not present

## 2016-02-12 LAB — COMPREHENSIVE METABOLIC PANEL
ALBUMIN: 4.2 g/dL (ref 3.5–5.2)
ALT: 9 U/L (ref 0–35)
AST: 12 U/L (ref 0–37)
Alkaline Phosphatase: 56 U/L (ref 39–117)
BUN: 10 mg/dL (ref 6–23)
CHLORIDE: 104 meq/L (ref 96–112)
CO2: 28 meq/L (ref 19–32)
CREATININE: 0.88 mg/dL (ref 0.40–1.20)
Calcium: 10.3 mg/dL (ref 8.4–10.5)
GFR: 81.09 mL/min (ref >60.00)
Glucose, Bld: 132 mg/dL — ABNORMAL HIGH (ref 70–99)
Potassium: 3.8 mEq/L (ref 3.5–5.1)
SODIUM: 140 meq/L (ref 135–145)
Total Bilirubin: 0.6 mg/dL (ref 0.2–1.2)
Total Protein: 7.8 g/dL (ref 6.0–8.3)

## 2016-02-12 LAB — CBC
HCT: 37.7 % (ref 36.0–46.0)
Hemoglobin: 12.7 g/dL (ref 12.0–15.0)
MCHC: 33.8 g/dL (ref 30.0–36.0)
MCV: 86.7 fl (ref 78.0–100.0)
Platelets: 388 10*3/uL (ref 150.0–400.0)
RBC: 4.35 Mil/uL (ref 3.87–5.11)
RDW: 13.9 % (ref 11.5–15.5)
WBC: 6.1 10*3/uL (ref 4.0–10.5)

## 2016-02-12 LAB — MICROALBUMIN / CREATININE URINE RATIO
Creatinine,U: 57.6 mg/dL
Microalb Creat Ratio: 55.9 mg/g — ABNORMAL HIGH (ref 0.0–30.0)
Microalb, Ur: 32.2 mg/dL — ABNORMAL HIGH (ref 0.0–1.9)

## 2016-02-12 LAB — HEMOGLOBIN A1C: Hgb A1c MFr Bld: 5.9 % (ref 4.6–6.5)

## 2016-02-12 NOTE — Patient Instructions (Signed)
Thank you for choosing [facility].  SUMMARY AND INSTRUCTIONS:   Medication:  Continue to take your medications as prescribed.   Labs:  Please stop by the lab on the lower level of the building for your blood work. Your results will be released to [place] (or called to you) after review, usually within 72 hours after test completion. If any changes need to be made, you will be notified at that same time.  1.) The lab is open from 7:30am to 5:30 pm Monday-Friday 2.) No appointment is necessary 3.) Fasting (if needed) is 6-8 hours after food and drink; black coffee and water are okay   Imaging / Radiology:  They are going to call to schedule your bone mineral density scan.   Follow up:  If your symptoms worsen or fail to improve, please contact our office for further instruction, or in case of emergency go directly to the emergency room at the closest medical facility.   Health Maintenance  Topic Date Due  . Hepatitis C Screening  1945/07/18  . FOOT EXAM  06/[birth year]/1953  . DEXA SCAN  06/24/2008  . PNA vac Low Risk Adult (1 of 2 - PCV13) 06/24/2008  . HEMOGLOBIN A1C  07/04/2015  . INFLUENZA VACCINE  01/15/2016  . OPHTHALMOLOGY EXAM  08/16/2016  . MAMMOGRAM  07/17/2017  . TETANUS/TDAP  11/14/2019  . COLONOSCOPY  10/15/2022  . ZOSTAVAX  Completed   Menopause is a normal process in which your reproductive ability comes to an end. This process happens gradually over a span of months to years, usually between the ages of 67 and 54. Menopause is complete when you have missed 12 consecutive menstrual periods. It is important to talk with your health care provider about some of the most common conditions that affect postmenopausal women, such as heart disease, cancer, and bone loss (osteoporosis). Adopting a healthy lifestyle and getting preventive care can help to promote your health and wellness. Those actions can also lower your chances of developing some of these common  conditions. WHAT SHOULD I KNOW ABOUT MENOPAUSE? During menopause, you may experience a number of symptoms, such as:  Moderate-to-severe hot flashes.  Night sweats.  Decrease in sex drive.  Mood swings.  Headaches.  Tiredness.  Irritability.  Memory problems.  Insomnia. Choosing to treat or not to treat menopausal changes is an individual decision that you make with your health care provider. WHAT SHOULD I KNOW ABOUT HORMONE REPLACEMENT THERAPY AND SUPPLEMENTS? Hormone therapy products are effective for treating symptoms that are associated with menopause, such as hot flashes and night sweats. Hormone replacement carries certain risks, especially as you become older. If you are thinking about using estrogen or estrogen with progestin treatments, discuss the benefits and risks with your health care provider. WHAT SHOULD I KNOW ABOUT HEART DISEASE AND STROKE? Heart disease, heart attack, and stroke become more likely as you age. This may be due, in part, to the hormonal changes that your body experiences during menopause. These can affect how your body processes dietary fats, triglycerides, and cholesterol. Heart attack and stroke are both medical emergencies. There are many things that you can do to help prevent heart disease and stroke:  Have your blood pressure checked at least every 1-2 years. High blood pressure causes heart disease and increases the risk of stroke.  If you are 90-66 years old, ask your health care provider if you should take aspirin to prevent a heart attack or a stroke.  Do not use any  tobacco products, including cigarettes, chewing tobacco, or electronic cigarettes. If you need help quitting, ask your health care provider.  It is important to eat a healthy diet and maintain a healthy weight.  Be sure to include plenty of vegetables, fruits, low-fat dairy products, and lean protein.  Avoid eating foods that are high in solid fats, added sugars, or salt  (sodium).  Get regular exercise. This is one of the most important things that you can do for your health.  Try to exercise for at least 150 minutes each week. The type of exercise that you do should increase your heart rate and make you sweat. This is known as moderate-intensity exercise.  Try to do strengthening exercises at least twice each week. Do these in addition to the moderate-intensity exercise.  Know your numbers.Ask your health care provider to check your cholesterol and your blood glucose. Continue to have your blood tested as directed by your health care provider. WHAT SHOULD I KNOW ABOUT CANCER SCREENING? There are several types of cancer. Take the following steps to reduce your risk and to catch any cancer development as early as possible. Breast Cancer  Practice breast self-awareness.  This means understanding how your breasts normally appear and feel.  It also means doing regular breast self-exams. Let your health care provider know about any changes, no matter how small.  If you are 28 or older, have a clinician do a breast exam (clinical breast exam or CBE) every year. Depending on your age, family history, and medical history, it may be recommended that you also have a yearly breast X-ray (mammogram).  If you have a family history of breast cancer, talk with your health care provider about genetic screening.  If you are at high risk for breast cancer, talk with your health care provider about having an MRI and a mammogram every year.  Breast cancer (BRCA) gene test is recommended for women who have family members with BRCA-related cancers. Results of the assessment will determine the need for genetic counseling and BRCA1 and for BRCA2 testing. BRCA-related cancers include these types:  Breast. This occurs in males or females.  Ovarian.  Tubal. This may also be called fallopian tube cancer.  Cancer of the abdominal or pelvic lining (peritoneal  cancer).  Prostate.  Pancreatic. Cervical, Uterine, and Ovarian Cancer Your health care provider may recommend that you be screened regularly for cancer of the pelvic organs. These include your ovaries, uterus, and vagina. This screening involves a pelvic exam, which includes checking for microscopic changes to the surface of your cervix (Pap test).  For women ages 21-65, health care providers may recommend a pelvic exam and a Pap test every three years. For women ages 65-65, they may recommend the Pap test and pelvic exam, combined with testing for human papilloma virus (HPV), every five years. Some types of HPV increase your risk of cervical cancer. Testing for HPV may also be done on women of any age who have unclear Pap test results.  Other health care providers may not recommend any screening for nonpregnant women who are considered low risk for pelvic cancer and have no symptoms. Ask your health care provider if a screening pelvic exam is right for you.  If you have had past treatment for cervical cancer or a condition that could lead to cancer, you need Pap tests and screening for cancer for at least 20 years after your treatment. If Pap tests have been discontinued for you, your risk factors (such  as having a new sexual partner) need to be reassessed to determine if you should start having screenings again. Some women have medical problems that increase the chance of getting cervical cancer. In these cases, your health care provider may recommend that you have screening and Pap tests more often.  If you have a family history of uterine cancer or ovarian cancer, talk with your health care provider about genetic screening.  If you have vaginal bleeding after reaching menopause, tell your health care provider.  There are currently no reliable tests available to screen for ovarian cancer. Lung Cancer Lung cancer screening is recommended for adults 69-5 years old who are at high risk for lung  cancer because of a history of smoking. A yearly low-dose CT scan of the lungs is recommended if you:  Currently smoke.  Have a history of at least 30 pack-years of smoking and you currently smoke or have quit within the past 15 years. A pack-year is smoking an average of one pack of cigarettes per day for one year. Yearly screening should:  Continue until it has been 15 years since you quit.  Stop if you develop a health problem that would prevent you from having lung cancer treatment. Colorectal Cancer  This type of cancer can be detected and can often be prevented.  Routine colorectal cancer screening usually begins at age 86 and continues through age 52.  If you have risk factors for colon cancer, your health care provider may recommend that you be screened at an earlier age.  If you have a family history of colorectal cancer, talk with your health care provider about genetic screening.  Your health care provider may also recommend using home test kits to check for hidden blood in your stool.  A small camera at the end of a tube can be used to examine your colon directly (sigmoidoscopy or colonoscopy). This is done to check for the earliest forms of colorectal cancer.  Direct examination of the colon should be repeated every 5-10 years until age 46. However, if early forms of precancerous polyps or small growths are found or if you have a family history or genetic risk for colorectal cancer, you may need to be screened more often. Skin Cancer  Check your skin from head to toe regularly.  Monitor any moles. Be sure to tell your health care provider:  About any new moles or changes in moles, especially if there is a change in a mole's shape or color.  If you have a mole that is larger than the size of a pencil eraser.  If any of your family members has a history of skin cancer, especially at a young age, talk with your health care provider about genetic screening.  Always use  sunscreen. Apply sunscreen liberally and repeatedly throughout the day.  Whenever you are outside, protect yourself by wearing long sleeves, pants, a wide-brimmed hat, and sunglasses. WHAT SHOULD I KNOW ABOUT OSTEOPOROSIS? Osteoporosis is a condition in which bone destruction happens more quickly than new bone creation. After menopause, you may be at an increased risk for osteoporosis. To help prevent osteoporosis or the bone fractures that can happen because of osteoporosis, the following is recommended:  If you are 42-37 years old, get at least 1,000 mg of calcium and at least 600 mg of vitamin D per day.  If you are older than age 61 but younger than age 88, get at least 1,200 mg of calcium and at least 600 mg  of vitamin D per day.  If you are older than age 56, get at least 1,200 mg of calcium and at least 800 mg of vitamin D per day. Smoking and excessive alcohol intake increase the risk of osteoporosis. Eat foods that are rich in calcium and vitamin D, and do weight-bearing exercises several times each week as directed by your health care provider. WHAT SHOULD I KNOW ABOUT HOW MENOPAUSE AFFECTS [place]? Depression may occur at any age, but it is more common as you become older. Common symptoms of depression include:  Low or sad mood.  Changes in sleep patterns.  Changes in appetite or eating patterns.  Feeling an overall lack of motivation or enjoyment of activities that you previously enjoyed.  Frequent crying spells. Talk with your health care provider if you think that you are experiencing depression. WHAT SHOULD I KNOW ABOUT IMMUNIZATIONS? It is important that you get and maintain your immunizations. These include:  Tetanus, diphtheria, and pertussis (Tdap) booster vaccine.  Influenza every year before the flu season begins.  Pneumonia vaccine.  Shingles vaccine. Your health care provider may also recommend other immunizations.   This information is not  intended to replace advice given to you by your health care provider. Make sure you discuss any questions you have with your health care provider.   Document Released: 07/25/2005 Document Revised: 06/23/2014 Document Reviewed: 02/02/2014 Elsevier Interactive Patient Education [employer].

## 2016-02-12 NOTE — Assessment & Plan Note (Addendum)
Reviewed and updated patient's medical, surgical, family and social history. Medications and allergies were also reviewed. Basic screenings for depression, activities of daily living, hearing, cognition and safety were performed. Provider list was updated and health plan was provided to the patient.   1) Anticipatory Guidance: Discussed importance of wearing a seatbelt while driving and not texting while driving; changing batteries in smoke detector at least once annually; wearing suntan lotion when outside; eating a balanced and moderate diet; getting physical activity at least 30 minutes per day.  2) Immunizations / Screenings / Labs:  Denies influenza. Indicates she has had her pneumonia vaccinations does not recall date. All other immunizations are up-to-date per recommendations. Obtain hepatitis C antibody for hepatitis C screening. Obtain DEXA scan for bone mineral density screening. Obtain urine microalbumin and A1c for diabetes screening. Diabetic foot exam completed. All other screenings are up-to-date per recommendations.  Overall well exam with risk factors for cardiovascular disease including diabetes, hypertension,and overweight.chronic medical conditions appear well controlled with current medication regimens and no adverse side effects. Recommend continued weight management through nutrition and physical activity. Continue other healthy lifestyle behaviors and choices. Follow-up prevention exam in 1 year. Follow-up office visit pending blood work as needed.

## 2016-02-12 NOTE — Progress Notes (Signed)
Subjective:    Patient ID: Meredith Phillips, female    DOB: April 29, 1946, 70 y.o.   MRN: [id number]  Chief Complaint  Patient presents with  . CPE    fasting    HPI:  Meredith Phillips is a 70 y.o. female who presents today for a Medicare Annual Wellness/Physical exam.    1) Health Maintenance -   Diet - Averages about 2-3 meals per day consisting of a regular diet; Caffeine intake of 1--2 cups on occasion  Exercise -  2) Preventative Exams / Immunizations:  Dental -- Due for exam  Vision --  Up to date.   Health Maintenance  Topic Date Due  . Hepatitis C Screening  December 29, 1945  . FOOT EXAM  06/24/1953  . DEXA SCAN  06/24/2008  . PNA vac Low Risk Adult (1 of 2 - PCV13) 06/24/2008  . HEMOGLOBIN A1C  07/04/2015  . INFLUENZA VACCINE  01/15/2016  . OPHTHALMOLOGY EXAM  08/16/2016  . MAMMOGRAM  07/17/2017  . TETANUS/TDAP  11/14/2019  . COLONOSCOPY  10/15/2022  . ZOSTAVAX  Completed     Immunization History  Administered Date(s) Administered  . Influenza,inj,Quad PF,36+ Mos 04/27/2015  . Zoster 12/26/2014     RISK FACTORS  Tobacco History  Smoking Status  . Never Smoker  Smokeless Tobacco  . Never Used     Cardiac risk factors: advanced age (older than 71 for men, 24 for women), diabetes mellitus and hypertension.  Depression Screen  Depression screen [facility] 2/9 02/12/2016  Decreased Interest 0  Down, Depressed, Hopeless 0  PHQ - 2 Score 0     Activities of Daily Living In your present state of health, do you have any difficulty performing the following activities?:  Driving? No Managing money?  No Feeding yourself? No Getting from bed to chair? No Climbing a flight of stairs? No Preparing food and eating?: No Bathing or showering? No Getting dressed: No Getting to the toilet? No Using the toilet: No Moving around from place to place: No In the past year have you fallen or had a near fall?:No   Home Safety Has smoke detector and wears seat  belts. No firearms. No excess sun exposure. Are there smokers in your home (other than you)?  No Do you feel safe at home?  Yes  Hearing Difficulties: No Do you often ask people to speak up or repeat themselves? No Do you experience ringing or noises in your ears? No  Do you have difficulty understanding soft or whispered voices? No    Cognitive Testing  Alert? Yes   Normal Appearance? Yes  Oriented to person? Yes  Place? Yes   Time? Yes  Recall of three objects?  Yes  Can perform simple calculations? Yes  Displays appropriate judgment? Yes  Can read the correct time from a watch face? Yes  Do you feel that you have a problem with memory? No  Do you often misplace items? No   Advanced Directives have been discussed with the patient? Yes   Current Physicians/Providers and Suppliers  1. Marcos Eke, FNP - Internal Medicine 2. Terrilee Files, MD - Sports Medicine  3. Samson Frederic, MD - Orthopedics  Indicate any recent Medical Services you may have received from other than [facility] providers in the past year (date may be approximate).  All answers were reviewed with the patient and necessary referrals were made:  Jeanine Luz, FNP   02/12/2016    Allergies  Allergen Reactions  . Codeine   .  Other     Doesn't like to take pain medication due to hallucinations.  [name] Shellfish Allergy      Outpatient Medications Prior to Visit  Medication Sig Dispense Refill  . amLODipine (NORVASC) 10 MG tablet Take 1 tablet (10 mg total) by mouth daily. 90 tablet 1  . carvedilol (COREG) 3.125 MG tablet Take 1 tablet (3.125 mg total) by mouth 2 (two) times daily with a meal. 60 tablet 5  . Cholecalciferol (VITAMIN D-3) 1000 UNITS CAPS Take 1,000 Units by mouth.    . clobetasol cream (TEMOVATE) 0.05 % Apply 1 application topically 2 (two) times daily. Do not use for more than two weeks at one time 30 g 2  . cloNIDine (CATAPRES) 0.1 MG tablet Take 1 tablet (0.1 mg total) by mouth 2 (two) times  daily. 60 tablet 5  . dicyclomine (BENTYL) 10 MG capsule Take 10 mg by mouth as needed for spasms.    [name] esomeprazole (NEXIUM) 40 MG capsule Take 1 capsule (40 mg total) by mouth daily at 12 noon. 90 capsule 0  . gabapentin (NEURONTIN) 300 MG capsule TAKE 1 CAPSULE (300 MG TOTAL) BY MOUTH 3 (THREE) TIMES DAILY. 90 capsule 3  . losartan (COZAAR) 100 MG tablet Take 1 tablet (100 mg total) by mouth daily. 90 tablet 1  . metFORMIN (GLUMETZA) 500 MG (MOD) 24 hr tablet Take 1 tablet (500 mg total) by mouth 2 (two) times daily. 180 tablet 1  . polycarbophil (FIBERCON) 625 MG tablet Take 625 mg by mouth.    . ranitidine (ZANTAC) 150 MG tablet Take 1 tablet (150 mg total) by mouth 2 (two) times daily. 180 tablet 0  . simvastatin (ZOCOR) 20 MG tablet TAKE 1 TABLET BY MOUTH EVERY DAY 90 tablet 1  . traMADol (ULTRAM) 50 MG tablet Take 1 tablet (50 mg total) by mouth every 8 (eight) hours as needed. 60 tablet 0   No facility-administered medications prior to visit.      Past Medical History:  Diagnosis Date  . Arthritis   . Benign essential HTN 02/18/2015  . Cellulitis 02/18/2015  . Chicken pox   . Diabetes mellitus without complication (HCC)   . Diabetic peripheral neuropathy (HCC)   . GERD (gastroesophageal reflux disease)   . Hypercholesteremia   . Hypertension   . Liver disease   . Polyp of colon      Past Surgical History:  Procedure Laterality Date  . APPENDECTOMY    . HERNIA REPAIR    . KNEE SURGERY    . ROTATOR CUFF REPAIR    . TONSILLECTOMY       Family History  Problem Relation Age of Onset  . Healthy Mother   . Healthy Father   . Healthy Paternal Grandmother   . Healthy Paternal Grandfather      Social History   Social History  . Marital status: Married    Spouse name: N/A  . Number of children: 5  . Years of education: 12   Occupational History  . Retired    Social History Main Topics  . Smoking status: Never Smoker  . Smokeless tobacco: Never Used  . Alcohol  use No  . Drug use: No  . Sexual activity: Not on file   Other Topics Concern  . Not on file   Social History Narrative   Fun: paint, word search   Feels safe where she is living and no concern for abuse   Denies religious beliefs effecting healthcare.  Review of Systems  Constitutional: Denies fever, chills, fatigue, or significant weight gain/loss. HENT: Head: Denies headache or neck pain Ears: Denies changes in hearing, ringing in ears, earache, drainage Nose: Denies discharge, stuffiness, itching, nosebleed, sinus pain Throat: Denies sore throat, hoarseness, dry mouth, sores, thrush Eyes: Denies loss/changes in vision, pain, redness, blurry/double vision, flashing lights Cardiovascular: Denies chest pain/discomfort, tightness, palpitations, shortness of breath with activity, difficulty lying down, swelling, sudden awakening with shortness of breath Respiratory: Denies shortness of breath, cough, sputum production, wheezing Gastrointestinal: Denies dysphasia, heartburn, change in appetite, nausea, change in bowel habits, rectal bleeding, constipation, diarrhea, yellow skin or eyes Genitourinary: Denies frequency, urgency, burning/pain, blood in urine, incontinence, change in urinary strength. Musculoskeletal: Denies muscle/joint pain, stiffness, back pain, redness or swelling of joints, trauma Skin: Denies rashes, lumps, itching, dryness, color changes, or hair/nail changes Neurological: Denies dizziness, fainting, seizures, weakness, numbness, tingling, tremor Psychiatric - Denies nervousness, stress, depression or memory loss Endocrine: Denies heat or cold intolerance, sweating, frequent urination, excessive thirst, changes in appetite Hematologic: Denies ease of bruising or bleeding    Objective:     BP 134/86 (BP Location: Left Arm, Patient Position: Sitting, Cuff Size: Normal)   Pulse 73   Temp 98.6 F (37 C) (Oral)   Resp 16   Ht 5\' 3"  (1.6 m)   Wt 161 lb (73  kg)   SpO2 97%   BMI 28.52 kg/m  Nursing note and vital signs reviewed.  Physical Exam  Constitutional: She is oriented to person, place, and time. She appears well-developed and well-nourished.  HENT:  Head: Normocephalic.  Right Ear: Hearing, tympanic membrane, external ear and ear canal normal.  Left Ear: Hearing, tympanic membrane, external ear and ear canal normal.  Nose: Nose normal.  Mouth/Throat: Uvula is midline, oropharynx is clear and moist and mucous membranes are normal.  Eyes: Conjunctivae and EOM are normal. Pupils are equal, round, and reactive to light.  Neck: Neck supple. No JVD present. No tracheal deviation present. No thyromegaly present.  Cardiovascular: Normal rate, regular rhythm, normal heart sounds and intact distal pulses.   Pulmonary/Chest: Effort normal and breath sounds normal.  Abdominal: Soft. Bowel sounds are normal. She exhibits no distension and no mass. There is no tenderness. There is no rebound and no guarding.  Musculoskeletal: Normal range of motion. She exhibits no edema or tenderness.  Lymphadenopathy:    She has no cervical adenopathy.  Neurological: She is alert and oriented to person, place, and time. She has normal reflexes. No cranial nerve deficit. She exhibits normal muscle tone. Coordination normal.  Skin: Skin is warm and dry.  Psychiatric: She has a normal mood and affect. Her behavior is normal. Judgment and thought content normal.       Assessment & Plan:   During the course of the visit the patient was educated and counseled about appropriate screening and preventive services including:    Pneumococcal vaccine   Influenza vaccine  Td vaccine  Colorectal cancer screening  Diabetes screening  Nutrition counseling   Diet review for nutrition referral? Yes ____  Not Indicated _X___   Patient Instructions (the written plan) was given to the patient.  Medicare Attestation I have personally reviewed: The patient's  medical and social history Their use of alcohol, tobacco or illicit drugs Their current medications and supplements The patient's functional ability including ADLs,fall risks, home safety risks, cognitive, and hearing and visual impairment Diet and physical activities Evidence for depression or mood disorders  The patient's  weight, height, BMI,  have been recorded in the chart.  I have made referrals, counseling, and provided education to the patient based on review of the above and I have provided the patient with a written personalized care plan for preventive services.     Problem List Items Addressed This Visit      Cardiovascular and Mediastinum   Benign essential HTN   Relevant Orders   CBC (Completed)   Comprehensive metabolic panel (Completed)     Endocrine   Type 2 diabetes mellitus (HCC)   Relevant Orders   Hemoglobin A1c (Completed)   Urine Microalbumin w/creat. ratio (Completed)     Other   Medicare annual wellness visit, subsequent - Primary    Reviewed and updated patient's medical, surgical, family and social history. Medications and allergies were also reviewed. Basic screenings for depression, activities of daily living, hearing, cognition and safety were performed. Provider list was updated and health plan was provided to the patient.   1) Anticipatory Guidance: Discussed importance of wearing a seatbelt while driving and not texting while driving; changing batteries in smoke detector at least once annually; wearing suntan lotion when outside; eating a balanced and moderate diet; getting physical activity at least 30 minutes per day.  2) Immunizations / Screenings / Labs:  Denies influenza. Indicates she has had her pneumonia vaccinations does not recall date. All other immunizations are up-to-date per recommendations. Obtain hepatitis C antibody for hepatitis C screening. Obtain DEXA scan for bone mineral density screening. Obtain urine microalbumin and A1c for  diabetes screening. Diabetic foot exam completed. All other screenings are up-to-date per recommendations.  Overall well exam with risk factors for cardiovascular disease including diabetes, hypertension,and overweight.chronic medical conditions appear well controlled with current medication regimens and no adverse side effects. Recommend continued weight management through nutrition and physical activity. Continue other healthy lifestyle behaviors and choices. Follow-up prevention exam in 1 year. Follow-up office visit pending blood work as needed.           Relevant Orders   Hepatitis C antibody    Other Visit Diagnoses    Other problems related to lifestyle       Postmenopausal status (age-related) (natural)       Relevant Orders   DG Bone Density       I am having Ms. Velardi maintain her dicyclomine, Vitamin D-3, polycarbophil, carvedilol, cloNIDine, metFORMIN, clobetasol cream, esomeprazole, traMADol, ranitidine, amLODipine, losartan, simvastatin, and gabapentin.   Follow-up: Return if symptoms worsen or fail to improve.   Jeanine Luz, FNP

## 2016-02-13 LAB — HEPATITIS C ANTIBODY: HCV AB: NEGATIVE

## 2016-02-27 ENCOUNTER — Ambulatory Visit (INDEPENDENT_AMBULATORY_CARE_PROVIDER_SITE_OTHER): Payer: PPO | Admitting: Internal Medicine

## 2016-02-27 ENCOUNTER — Encounter: Payer: Self-pay | Admitting: Internal Medicine

## 2016-02-27 DIAGNOSIS — J069 Acute upper respiratory infection, unspecified: Secondary | ICD-10-CM | POA: Diagnosis not present

## 2016-02-27 DIAGNOSIS — E119 Type 2 diabetes mellitus without complications: Secondary | ICD-10-CM | POA: Diagnosis not present

## 2016-02-27 DIAGNOSIS — J309 Allergic rhinitis, unspecified: Secondary | ICD-10-CM | POA: Diagnosis not present

## 2016-02-27 DIAGNOSIS — I10 Essential (primary) hypertension: Secondary | ICD-10-CM

## 2016-02-27 MED ORDER — AZITHROMYCIN 250 MG PO TABS: ORAL_TABLET | ORAL | 1 refills | Status: DC

## 2016-02-27 MED ORDER — TRIAMCINOLONE ACETONIDE 55 MCG/ACT NA AERO: 2.0000 | INHALATION_SPRAY | Freq: Every day | NASAL | 12 refills | Status: DC

## 2016-02-27 NOTE — Patient Instructions (Signed)
Please take all new medication as prescribed - the antibiotic, and the nasacort  Please continue all other medications as before, and refills have been done if requested.  Please have the pharmacy call with any other refills you may need.  Please keep your appointments with your specialists as you may have planned

## 2016-02-27 NOTE — Assessment & Plan Note (Signed)
Mild to mod, for claritin and nasacort otx,  to f/u any worsening symptoms or concernsf

## 2016-02-27 NOTE — Assessment & Plan Note (Signed)
stable overall by history and exam, recent data reviewed with pt, and pt to continue medical treatment as before,  to f/u any worsening symptoms or concerns BP Readings from Last 3 Encounters:  02/27/16 132/82  02/12/16 134/86  02/05/16 (!) 142/86

## 2016-02-27 NOTE — Assessment & Plan Note (Signed)
Mild to mod, for antibx course,  to f/u any worsening symptoms or concerns 

## 2016-02-27 NOTE — Progress Notes (Signed)
Subjective:    Patient ID: Meredith Phillips, female    DOB: February 22, 1946, 70 y.o.   MRN: [id number]  HPI     Here with 2-3 days acute onset fever, facial pain, pressure, headache, general weakness and malaise, and greenish d/c, with mild ST and cough, but pt denies chest pain, wheezing, increased sob or doe, orthopnea, PND, increased LE swelling, palpitations, dizziness or syncope.  Does have several wks ongoing nasal allergy symptoms with clearish congestion, itch and sneezing, without fever, pain, ST, cough, swelling or wheezing.   Pt denies polydipsia, polyuria, Past Medical History:  Diagnosis Date  . Arthritis   . Benign essential HTN 02/18/2015  . Cellulitis 02/18/2015  . Chicken pox   . Diabetes mellitus without complication (HCC)   . Diabetic peripheral neuropathy (HCC)   . GERD (gastroesophageal reflux disease)   . Hypercholesteremia   . Hypertension   . Liver disease   . Polyp of colon    Past Surgical History:  Procedure Laterality Date  . APPENDECTOMY    . HERNIA REPAIR    . KNEE SURGERY    . ROTATOR CUFF REPAIR    . TONSILLECTOMY      reports that she has never smoked. She has never used smokeless tobacco. She reports that she does not drink alcohol or use drugs. family history includes Healthy in her father, mother, paternal grandfather, and paternal grandmother. Allergies  Allergen Reactions  . Codeine   . Other     Doesn't like to take pain medication due to hallucinations.  [name] Shellfish Allergy    Current Outpatient Prescriptions on File Prior to Visit  Medication Sig Dispense Refill  . amLODipine (NORVASC) 10 MG tablet Take 1 tablet (10 mg total) by mouth daily. 90 tablet 1  . carvedilol (COREG) 3.125 MG tablet Take 1 tablet (3.125 mg total) by mouth 2 (two) times daily with a meal. 60 tablet 5  . Cholecalciferol (VITAMIN D-3) 1000 UNITS CAPS Take 1,000 Units by mouth.    . clobetasol cream (TEMOVATE) 0.05 % Apply 1 application topically 2 (two) times daily. Do not  use for more than two weeks at one time 30 g 2  . cloNIDine (CATAPRES) 0.1 MG tablet Take 1 tablet (0.1 mg total) by mouth 2 (two) times daily. 60 tablet 5  . dicyclomine (BENTYL) 10 MG capsule Take 10 mg by mouth as needed for spasms.    [name] esomeprazole (NEXIUM) 40 MG capsule Take 1 capsule (40 mg total) by mouth daily at 12 noon. 90 capsule 0  . gabapentin (NEURONTIN) 300 MG capsule TAKE 1 CAPSULE (300 MG TOTAL) BY MOUTH 3 (THREE) TIMES DAILY. 90 capsule 3  . losartan (COZAAR) 100 MG tablet Take 1 tablet (100 mg total) by mouth daily. 90 tablet 1  . metFORMIN (GLUMETZA) 500 MG (MOD) 24 hr tablet Take 1 tablet (500 mg total) by mouth 2 (two) times daily. 180 tablet 1  . polycarbophil (FIBERCON) 625 MG tablet Take 625 mg by mouth.    . ranitidine (ZANTAC) 150 MG tablet Take 1 tablet (150 mg total) by mouth 2 (two) times daily. 180 tablet 0  . simvastatin (ZOCOR) 20 MG tablet TAKE 1 TABLET BY MOUTH EVERY DAY 90 tablet 1  . traMADol (ULTRAM) 50 MG tablet Take 1 tablet (50 mg total) by mouth every 8 (eight) hours as needed. 60 tablet 0   No current facility-administered medications on file prior to visit.    Review of Systems  Constitutional: Negative  for unusual diaphoresis or night sweats HENT: Negative for ear swelling or discharge Eyes: Negative for worsening visual haziness  Respiratory: Negative for choking and stridor.   Gastrointestinal: Negative for distension or worsening eructation Genitourinary: Negative for retention or change in urine volume.  Musculoskeletal: Negative for other MSK pain or swelling Skin: Negative for color change and worsening wound Neurological: Negative for tremors and numbness other than noted  Psychiatric/Behavioral: Negative for decreased concentration or agitation other than above       Objective:   Physical Exam BP 132/82   Pulse 82   Temp 98.3 F (36.8 C) (Oral)   Resp 20   Wt 161 lb (73 kg)   SpO2 98%   BMI 28.52 kg/m  VS noted, mild  ill Constitutional: Pt appears in no apparent distress HENT: Head: NCAT.  Right Ear: External ear normal.  Left Ear: External ear normal.  Eyes: . Pupils are equal, round, and reactive to light. Conjunctivae and EOM are normal Bilat tm's with mild erythema.  Max sinus areas mild tender.  Pharynx with mild erythema, no exudateNeck: Normal range of motion. Neck supple.  with mild tender submandibular LA Cardiovascular: Normal rate and regular rhythm.   Pulmonary/Chest: Effort normal and breath sounds decreased without rales or wheezing.  Neurological: Pt is alert. Not confused , motor grossly intact Skin: Skin is warm. No rash, no LE edema Psychiatric: Pt behavior is normal. No agitation.     Assessment & Plan:

## 2016-02-27 NOTE — Assessment & Plan Note (Signed)
stable overall by history and exam, recent data reviewed with pt, and pt to continue medical treatment as before,  to f/u any worsening symptoms or concerns Lab Results  Component Value Date   HGBA1C 5.9 02/12/2016

## 2016-02-27 NOTE — Progress Notes (Signed)
Pre visit review using our clinic review tool, if applicable. No additional management support is needed unless otherwise documented below in the visit note. 

## 2016-03-12 ENCOUNTER — Other Ambulatory Visit: Payer: Self-pay | Admitting: Family

## 2016-03-18 ENCOUNTER — Other Ambulatory Visit: Payer: Self-pay | Admitting: Family

## 2016-04-03 ENCOUNTER — Telehealth: Payer: Self-pay | Admitting: Family

## 2016-04-03 NOTE — Telephone Encounter (Signed)
Patient Name: Meredith Phillips  DOB: 1946-05-14    Initial Comment Caller states she had bad cough since last night, having chest pain.   Nurse Assessment  Nurse: Stefano Gaul, RN, Dwana Curd Date/Time Lamount Cohen Time): 04/03/2016 2:24:26 PM  Confirm and document reason for call. If symptomatic, describe symptoms. You must click the next button to save text entered. ---Caller states she has had severe cough since last night. She coughed all night. Has had chest pain but not now. Has acid reflux. No fever.  Has the patient traveled out of the country within the last 30 days? ---No  Does the patient have any new or worsening symptoms? ---Yes  Will a triage be completed? ---Yes  Related visit to physician within the last 2 weeks? ---No  Does the PT have any chronic conditions? (i.e. diabetes, asthma, etc.) ---Yes  List chronic conditions. ---diabetes; HTN  Is this a behavioral health or substance abuse call? ---No     Guidelines    Guideline Title Affirmed Question Affirmed Notes  Cough - Acute Non-Productive Cough (all triage questions negative)    Final Disposition User   Home Care Stefano Gaul, RN, Dwana Curd    Disagree/Comply: Comply

## 2016-04-07 ENCOUNTER — Ambulatory Visit: Payer: PPO | Admitting: Family

## 2016-04-07 DIAGNOSIS — Z0289 Encounter for other administrative examinations: Secondary | ICD-10-CM

## 2016-04-08 ENCOUNTER — Telehealth: Payer: Self-pay | Admitting: Family

## 2016-04-08 NOTE — Telephone Encounter (Signed)
Ok to reschedule if she calls back.  

## 2016-04-08 NOTE — Telephone Encounter (Signed)
Patient no showed for acute visit on 04/07/2016.  Please advise.

## 2016-04-23 ENCOUNTER — Other Ambulatory Visit: Payer: Self-pay

## 2016-04-23 ENCOUNTER — Ambulatory Visit: Payer: PPO | Admitting: Internal Medicine

## 2016-04-23 MED ORDER — CARVEDILOL 3.125 MG PO TABS: 3.1250 mg | ORAL_TABLET | Freq: Two times a day (BID) | ORAL | 5 refills | Status: DC

## 2016-04-23 MED ORDER — CLONIDINE HCL 0.1 MG PO TABS: 0.1000 mg | ORAL_TABLET | Freq: Two times a day (BID) | ORAL | 5 refills | Status: DC

## 2016-04-29 ENCOUNTER — Other Ambulatory Visit: Payer: Self-pay

## 2016-05-13 ENCOUNTER — Other Ambulatory Visit (INDEPENDENT_AMBULATORY_CARE_PROVIDER_SITE_OTHER): Payer: PPO

## 2016-05-13 ENCOUNTER — Encounter: Payer: Self-pay | Admitting: Family

## 2016-05-13 ENCOUNTER — Ambulatory Visit (INDEPENDENT_AMBULATORY_CARE_PROVIDER_SITE_OTHER): Payer: PPO | Admitting: Family

## 2016-05-13 DIAGNOSIS — I10 Essential (primary) hypertension: Secondary | ICD-10-CM | POA: Diagnosis not present

## 2016-05-13 DIAGNOSIS — Z23 Encounter for immunization: Secondary | ICD-10-CM

## 2016-05-13 DIAGNOSIS — R42 Dizziness and giddiness: Secondary | ICD-10-CM | POA: Diagnosis not present

## 2016-05-13 LAB — COMPREHENSIVE METABOLIC PANEL
ALBUMIN: 4.2 g/dL (ref 3.5–5.2)
ALK PHOS: 57 U/L (ref 39–117)
ALT: 8 U/L (ref 0–35)
AST: 11 U/L (ref 0–37)
BUN: 6 mg/dL (ref 6–23)
CO2: 30 mEq/L (ref 19–32)
CREATININE: 0.73 mg/dL (ref 0.40–1.20)
Calcium: 10.3 mg/dL (ref 8.4–10.5)
Chloride: 105 mEq/L (ref 96–112)
GFR: 100.53 mL/min (ref >60.00)
Glucose, Bld: 123 mg/dL — ABNORMAL HIGH (ref 70–99)
Potassium: 3.5 mEq/L (ref 3.5–5.1)
SODIUM: 143 meq/L (ref 135–145)
TOTAL PROTEIN: 7.8 g/dL (ref 6.0–8.3)
Total Bilirubin: 0.6 mg/dL (ref 0.2–1.2)

## 2016-05-13 NOTE — Assessment & Plan Note (Signed)
Blood pressure remains poorly controlled secondary to patient noncompliance with medication regimen which is most likely multifactorial. Encouraged to take blood pressure medications as prescribed to prevent the risk of end organ damage in the future. Continue current dosage of carvedilol, amlodipine, clonidine, and losartan. Monitor blood pressures at home and follow low-sodium diet. Denies worse headache of life with no new symptoms of end organ damage noted upon exam. Follow-up in 2 weeks for blood pressure check her symptoms worsen or do not improve.

## 2016-05-13 NOTE — Assessment & Plan Note (Signed)
Lightheadedness most likely multifactorial including dehydration as well as elevated blood pressure. Encouraged increased fluid intake and avoid caffeinated and alcoholic beverages. Obtain complete metabolic profile to check electrolytes and kidney function. Encouraged to take blood pressure medications as prescribed and continue to monitor blood pressure at home. Denies worse headache of life. Continue to monitor.

## 2016-05-13 NOTE — Patient Instructions (Addendum)
Thank you for choosing [facility].  SUMMARY AND INSTRUCTIONS:  Please decrease your soda intake and increase your water intake.   Work on taking your blood pressure medications   Follow a low sodium diet.   Labs:  Please stop by the lab on the lower level of the building for your blood work. Your results will be released to MyChart (or called to you) after review, usually within 72 hours after test completion. If any changes need to be made, you will be notified at that same time.  1.) The lab is open from 7:30am to 5:30 pm Monday-Friday 2.) No appointment is necessary 3.) Fasting (if needed) is 6-8 hours after food and drink; black coffee and water are okay   Follow up:  If your symptoms worsen or fail to improve, please contact our office for further instruction, or in case of emergency go directly to the emergency room at the closest medical facility.

## 2016-05-13 NOTE — Progress Notes (Signed)
Subjective:    Patient ID: Meredith Phillips, female    DOB: Sep 06, 1945, 70 y.o.   MRN: [id number]  Chief Complaint  Patient presents with  . Follow-up    Dizziness, hypertension    HPI:  Meredith Phillips is a 70 y.o. female who  has a past medical history of Arthritis; Benign essential HTN (02/18/2015); Cellulitis (02/18/2015); Chicken pox; Diabetes mellitus without complication (HCC); Diabetic peripheral neuropathy (HCC); GERD (gastroesophageal reflux disease); Hypercholesteremia; Hypertension; Liver disease; and Polyp of colon. and presents today for an office visit.    1.) Dizziness - This is a new problem. Associated symptom of dizziness has been going on for about 3 weeks. Describes the sensation as lightheadedness and going sideways when she walks even when she is walking straight. Indicates that she has been drinking a lot of soda and some water. Occasional fuzzy vision. Headaches occur on occasion.   2.) Hypertension - Currently maintained on amlodipine, carvedilol, clonidine, and losartan. Reports taking the medication as prescribed and denies adverse side effects. Notes that her blood pressure has been elevated recently. Denies worst headache of life or new onset symptoms of end organ damage.    BP Readings from Last 3 Encounters:  05/13/16 (!) 186/102  02/27/16 132/82  02/12/16 134/86    Allergies  Allergen Reactions  . Codeine   . Other     Doesn't like to take pain medication due to hallucinations.  [name] Shellfish Allergy      Outpatient Medications Prior to Visit  Medication Sig Dispense Refill  . amLODipine (NORVASC) 10 MG tablet Take 1 tablet (10 mg total) by mouth daily. 90 tablet 1  . carvedilol (COREG) 3.125 MG tablet Take 1 tablet (3.125 mg total) by mouth 2 (two) times daily with a meal. 60 tablet 5  . Cholecalciferol (VITAMIN D-3) 1000 UNITS CAPS Take 1,000 Units by mouth.    . clobetasol cream (TEMOVATE) 0.05 % Apply 1 application topically 2 (two) times daily.  Do not use for more than two weeks at one time 30 g 2  . cloNIDine (CATAPRES) 0.1 MG tablet Take 1 tablet (0.1 mg total) by mouth 2 (two) times daily. 60 tablet 5  . dicyclomine (BENTYL) 10 MG capsule Take 10 mg by mouth as needed for spasms.    [name] esomeprazole (NEXIUM) 40 MG capsule Take 1 capsule (40 mg total) by mouth daily at 12 noon. 90 capsule 0  . gabapentin (NEURONTIN) 300 MG capsule TAKE 1 CAPSULE (300 MG TOTAL) BY MOUTH 3 (THREE) TIMES DAILY. 90 capsule 3  . losartan (COZAAR) 100 MG tablet Take 1 tablet (100 mg total) by mouth daily. 90 tablet 1  . metFORMIN (GLUMETZA) 500 MG (MOD) 24 hr tablet Take 1 tablet (500 mg total) by mouth 2 (two) times daily. 180 tablet 1  . metFORMIN (GLUMETZA) 500 MG (MOD) 24 hr tablet TAKE 1 TABLET BY MOUTH TWICE A DAY 180 tablet 0  . polycarbophil (FIBERCON) 625 MG tablet Take 625 mg by mouth.    . ranitidine (ZANTAC) 150 MG tablet TAKE 1 TABLET (150 MG TOTAL) BY MOUTH 2 (TWO) TIMES DAILY. 180 tablet 0  . simvastatin (ZOCOR) 20 MG tablet TAKE 1 TABLET BY MOUTH EVERY DAY 90 tablet 1  . traMADol (ULTRAM) 50 MG tablet Take 1 tablet (50 mg total) by mouth every 8 (eight) hours as needed. 60 tablet 0  . triamcinolone (NASACORT AQ) 55 MCG/ACT AERO nasal inhaler Place 2 sprays into the nose daily. 1 Inhaler  12  . azithromycin (ZITHROMAX Z-PAK) 250 MG tablet 2 tab by mouth day 1, then 1 per day 6 tablet 1   No facility-administered medications prior to visit.      Review of Systems  Constitutional: Negative for chills and fever.  Eyes:       Negative for changes in vision  Respiratory: Negative for cough, chest tightness and wheezing.   Cardiovascular: Negative for chest pain, palpitations and leg swelling.  Neurological: Positive for dizziness and light-headedness. Negative for weakness and headaches.      Objective:    BP (!) 186/102 (BP Location: Left Arm, Patient Position: Sitting, Cuff Size: Normal)   Pulse 79   Temp 98 F (36.7 C) (Oral)   Resp  16   Ht 5\' 3"  (1.6 m)   Wt 156 lb (70.8 kg)   SpO2 98%   BMI 27.63 kg/m  Nursing note and vital signs reviewed.  Physical Exam  Constitutional: She is oriented to person, place, and time. She appears well-developed and well-nourished. No distress.  HENT:  Right Ear: Hearing, tympanic membrane, external ear and ear canal normal.  Left Ear: Hearing, tympanic membrane, external ear and ear canal normal.  Nose: Nose normal.  Mouth/Throat: Uvula is midline, oropharynx is clear and moist and mucous membranes are normal.  Eyes: Conjunctivae and EOM are normal. Pupils are equal, round, and reactive to light.  Cardiovascular: Normal rate, regular rhythm, normal heart sounds and intact distal pulses.   Pulmonary/Chest: Effort normal and breath sounds normal.  Neurological: She is alert and oriented to person, place, and time. No cranial nerve deficit.  Skin: Skin is warm and dry.  Psychiatric: She has a normal mood and affect. Her behavior is normal. Judgment and thought content normal.       Assessment & Plan:   Problem List Items Addressed This Visit      Cardiovascular and Mediastinum   Benign essential HTN    Blood pressure remains poorly controlled secondary to patient noncompliance with medication regimen which is most likely multifactorial. Encouraged to take blood pressure medications as prescribed to prevent the risk of end organ damage in the future. Continue current dosage of carvedilol, amlodipine, clonidine, and losartan. Monitor blood pressures at home and follow low-sodium diet. Denies worse headache of life with no new symptoms of end organ damage noted upon exam. Follow-up in 2 weeks for blood pressure check her symptoms worsen or do not improve.      Relevant Orders   Comprehensive metabolic panel (Completed)     Other   Lightheadedness    Lightheadedness most likely multifactorial including dehydration as well as elevated blood pressure. Encouraged increased fluid  intake and avoid caffeinated and alcoholic beverages. Obtain complete metabolic profile to check electrolytes and kidney function. Encouraged to take blood pressure medications as prescribed and continue to monitor blood pressure at home. Denies worse headache of life. Continue to monitor.      Relevant Orders   Comprehensive metabolic panel (Completed)       I have discontinued Ms. Netto's azithromycin. I am also having her maintain her dicyclomine, Vitamin D-3, polycarbophil, metFORMIN, clobetasol cream, esomeprazole, traMADol, amLODipine, losartan, simvastatin, gabapentin, triamcinolone, ranitidine, metFORMIN, cloNIDine, and carvedilol.   Follow-up: Return in about 2 weeks (around 05/27/2016), or if symptoms worsen or fail to improve.  14/05/2016, FNP

## 2016-05-27 ENCOUNTER — Other Ambulatory Visit: Payer: Self-pay | Admitting: Family

## 2016-05-27 ENCOUNTER — Ambulatory Visit: Payer: PPO | Admitting: Family

## 2016-05-29 ENCOUNTER — Ambulatory Visit: Payer: PPO | Admitting: Family

## 2016-05-29 ENCOUNTER — Telehealth: Payer: Self-pay | Admitting: Family

## 2016-05-29 NOTE — Telephone Encounter (Signed)
Noted. Continue to take medications as prescribed and follow up when able.

## 2016-05-29 NOTE — Telephone Encounter (Signed)
Patient had to cancel appt today b/c she does not drive anymore and the lady who was going to bring her had to pick one of her grandkids up from school this morning.  Patient states she will call back to reschedule.  Patient states her blood pressure has came down.

## 2016-06-13 ENCOUNTER — Other Ambulatory Visit: Payer: Self-pay | Admitting: Family

## 2016-06-20 ENCOUNTER — Other Ambulatory Visit: Payer: Self-pay | Admitting: Family

## 2016-07-10 ENCOUNTER — Other Ambulatory Visit: Payer: Self-pay | Admitting: Family

## 2016-07-15 ENCOUNTER — Ambulatory Visit: Payer: Medicare Other | Admitting: Family

## 2016-07-29 ENCOUNTER — Encounter: Payer: Self-pay | Admitting: Family

## 2016-07-29 ENCOUNTER — Ambulatory Visit (INDEPENDENT_AMBULATORY_CARE_PROVIDER_SITE_OTHER): Payer: Medicare Other | Admitting: Family

## 2016-07-29 DIAGNOSIS — Z634 Disappearance and death of family member: Secondary | ICD-10-CM | POA: Diagnosis not present

## 2016-07-29 DIAGNOSIS — F4321 Adjustment disorder with depressed mood: Secondary | ICD-10-CM

## 2016-07-29 MED ORDER — METFORMIN HCL ER (MOD) 500 MG PO TB24: 500.0000 mg | ORAL_TABLET | Freq: Two times a day (BID) | ORAL | 0 refills | Status: DC

## 2016-07-29 NOTE — Patient Instructions (Signed)
Thank you for choosing [facility].  SUMMARY AND INSTRUCTIONS:  Recommend speaking with a counselor or a pastor to ensure that your grieving process does not become complicated.   Follow up:  If your symptoms worsen or fail to improve, please contact our office for further instruction, or in case of emergency go directly to the emergency room at the closest medical facility.    Complicated Grieving Introduction Grief is a normal response to the death of someone close to you. Feelings of fear, anger, and guilt can affect almost everyone who loses a loved one. It is also common to have symptoms of depression while you are grieving. These include problems with sleep, loss of appetite, and lack of energy. They may last for weeks or months after a loss. Complicated grief is different from normal grief or depression. Normal grieving involves sadness and feelings of loss, but these feelings are not constant. Complicated grief is a constant and severe type of grief. It interferes with your ability to function normally. It may last for several months to a year or longer. Complicated grief may require treatment from a mental health care provider. What are the causes? It is not known why some people continue to struggle with grief and others do not. You may be at higher risk for complicated grief if:  The death of your loved one was sudden or unexpected.  The death of your loved one was due to a violent event.  Your loved one committed suicide.  Your loved one was a child or a young person.  You were very close to or dependent on the loved one.  You have a history of depression. What are the signs or symptoms? Signs and symptoms of complicated grief may include:  Feeling disbelief or numbness.  Being unable to enjoy good memories of your loved one.  Needing to avoid anything that reminds you of your loved one.  Being unable to stop thinking about the death.  Feeling intense anger  or guilt.  Feeling alone and hopeless.  Feeling that your life is meaningless and empty.  Losing the desire to live. How is this diagnosed? Your health care provider may diagnose complicated grief if:  You have constant symptoms of grief for 6-12 months or longer.  Your symptoms are interfering with your ability to live your life. Your health care provider may want you to see a mental health care provider. Many symptoms of depression are similar to the symptoms of complicated grief. It is important to be evaluated for complicated grief along with other mental health conditions. How is this treated? Talk therapy with a mental health provider is the most common treatment for complicated grief. During therapy, you will learn healthy ways to cope with the loss of your loved one. In some cases, your mental health care provider may also recommend antidepressant medicines. Follow these instructions at home:  Take care of yourself.  Eat regular meals and maintain a healthy diet. Eat plenty of fruits, vegetables, and whole grains.  Try to get some exercise each day.  Keep regular hours for sleep. Try to get at least 8 hours of sleep each night.  Do not use drugs or alcohol to ease your symptoms.  Take medicines only as directed by your health care provider.  Spend time with friends and loved ones.  Consider joining a grief (bereavement) support group to help you deal with your loss.  Keep all follow-up visits as directed by your health care provider.  This is important. Contact a health care provider if:  Your symptoms keep you from functioning normally.  Your symptoms do not get better with treatment. Get help right away if:  You have serious thoughts of hurting yourself or someone else.  You have suicidal feelings. This information is not intended to replace advice given to you by your health care provider. Make sure you discuss any questions you have with your health care  provider. Document Released: 06/02/2005 Document Revised: 11/08/2015 Document Reviewed: 11/10/2013  2017 [employer]

## 2016-07-29 NOTE — Progress Notes (Signed)
Subjective:    Patient ID: Meredith Phillips, female    DOB: April 25, 1946, 71 y.o.   MRN: [id number]  Chief Complaint  Patient presents with  . Depression    HPI:  Meredith JOERGER is a 71 y.o. female who  has a past medical history of Arthritis; Benign essential HTN (02/18/2015); Cellulitis (02/18/2015); Chicken pox; Diabetes mellitus without complication (HCC); Diabetic peripheral neuropathy (HCC); GERD (gastroesophageal reflux disease); Hypercholesteremia; Hypertension; Liver disease; and Polyp of colon. and presents today for an acute office visit.  1.) Not feeling well - This is a new problem. Associated symptom of not feeling well has been going on for a couple of months. Not eating or sleeping well. Notes that she feels depressed secondary to the recent loss of her daughter to illness. Describes difficulty completing tasks at times. Symptoms generally wax and wane. Denies suicidal ideations. Symptoms are generally improving. Does have a good support system in her family, friends and church group.   Allergies  Allergen Reactions  . Codeine   . Other     Doesn't like to take pain medication due to hallucinations.  [name] Shellfish Allergy       Outpatient Medications Prior to Visit  Medication Sig Dispense Refill  . amLODipine (NORVASC) 10 MG tablet TAKE 1 TABLET (10 MG TOTAL) BY MOUTH DAILY. 90 tablet 1  . carvedilol (COREG) 3.125 MG tablet Take 1 tablet (3.125 mg total) by mouth 2 (two) times daily with a meal. 60 tablet 5  . Cholecalciferol (VITAMIN D-3) 1000 UNITS CAPS Take 1,000 Units by mouth.    . clobetasol cream (TEMOVATE) 0.05 % Apply 1 application topically 2 (two) times daily. Do not use for more than two weeks at one time 30 g 2  . cloNIDine (CATAPRES) 0.1 MG tablet Take 1 tablet (0.1 mg total) by mouth 2 (two) times daily. 60 tablet 5  . dicyclomine (BENTYL) 10 MG capsule Take 10 mg by mouth as needed for spasms.    [name] esomeprazole (NEXIUM) 40 MG capsule Take 1 capsule (40 mg  total) by mouth daily at 12 noon. 90 capsule 0  . gabapentin (NEURONTIN) 300 MG capsule TAKE 1 CAPSULE (300 MG TOTAL) BY MOUTH 3 (THREE) TIMES DAILY. 90 capsule 3  . losartan (COZAAR) 100 MG tablet Take 1 tablet (100 mg total) by mouth daily. 90 tablet 1  . polycarbophil (FIBERCON) 625 MG tablet Take 625 mg by mouth.    . ranitidine (ZANTAC) 150 MG tablet TAKE 1 TABLET (150 MG TOTAL) BY MOUTH 2 (TWO) TIMES DAILY. 180 tablet 0  . simvastatin (ZOCOR) 20 MG tablet TAKE 1 TABLET BY MOUTH EVERY DAY 90 tablet 1  . traMADol (ULTRAM) 50 MG tablet Take 1 tablet (50 mg total) by mouth every 8 (eight) hours as needed. 60 tablet 0  . triamcinolone (NASACORT AQ) 55 MCG/ACT AERO nasal inhaler Place 2 sprays into the nose daily. 1 Inhaler 12  . gabapentin (NEURONTIN) 300 MG capsule TAKE 1 CAPSULE (300 MG TOTAL) BY MOUTH 3 (THREE) TIMES DAILY. 90 capsule 2  . metFORMIN (GLUMETZA) 500 MG (MOD) 24 hr tablet Take 1 tablet (500 mg total) by mouth 2 (two) times daily. 180 tablet 1  . metFORMIN (GLUMETZA) 500 MG (MOD) 24 hr tablet TAKE 1 TABLET BY MOUTH TWICE A DAY 180 tablet 1  . metFORMIN (GLUMETZA) 500 MG (MOD) 24 hr tablet TAKE 1 TABLET BY MOUTH TWICE A DAY 180 tablet 0   No facility-administered medications prior to visit.  Review of Systems  Constitutional: Positive for appetite change and fatigue. Negative for chills and fever.  Respiratory: Negative for cough, chest tightness and shortness of breath.   Cardiovascular: Negative for chest pain, palpitations and leg swelling.  Psychiatric/Behavioral: Positive for dysphoric mood and sleep disturbance. Negative for suicidal ideas.      Objective:    BP (!) 152/92 (BP Location: Left Arm, Patient Position: Sitting, Cuff Size: Normal)   Pulse (!) 58   Temp 97.5 F (36.4 C) (Oral)   Resp 16   Ht 5\' 3"  (1.6 m)   Wt 157 lb (71.2 kg)   SpO2 95%   BMI 27.81 kg/m  Nursing note and vital signs reviewed.  Physical Exam  Constitutional: She is oriented  to person, place, and time. She appears well-developed and well-nourished. No distress.  Cardiovascular: Normal rate, regular rhythm, normal heart sounds and intact distal pulses.   Pulmonary/Chest: Effort normal and breath sounds normal.  Neurological: She is alert and oriented to person, place, and time.  Skin: Skin is warm and dry.  Psychiatric: Her speech is normal and behavior is normal. Judgment and thought content normal. She exhibits a depressed mood.       Assessment & Plan:   Problem List Items Addressed This Visit      Other   Grief at loss of child    Symptoms and exam consistent with normal grieving process secondary to recent loss of her daughter due to illness within the past couple of months. Encouarged counseling and medication. Declined medication and notes she has very good support system around her. Denis suicidal ideations. Follow up in 1 month or sooner.           I have discontinued Meredith Phillips's metFORMIN and metFORMIN. I have also changed her metFORMIN. Additionally, I am having her maintain her dicyclomine, Vitamin D-3, polycarbophil, clobetasol cream, esomeprazole, traMADol, losartan, gabapentin, triamcinolone, ranitidine, cloNIDine, carvedilol, simvastatin, and amLODipine.   Meds ordered this encounter  Medications  . metFORMIN (GLUMETZA) 500 MG (MOD) 24 hr tablet    Sig: Take 1 tablet (500 mg total) by mouth 2 (two) times daily.    Dispense:  180 tablet    Refill:  0     Follow-up: Return in about 1 month (around 08/26/2016), or if symptoms worsen or fail to improve.  08/28/2016, FNP

## 2016-07-29 NOTE — Assessment & Plan Note (Signed)
Symptoms and exam consistent with normal grieving process secondary to recent loss of her daughter due to illness within the past couple of months. Encouarged counseling and medication. Declined medication and notes she has very good support system around her. Denis suicidal ideations. Follow up in 1 month or sooner.

## 2016-08-18 ENCOUNTER — Telehealth: Payer: Self-pay | Admitting: Family

## 2016-08-18 MED ORDER — BLOOD GLUCOSE MONITOR KIT: PACK | 0 refills | Status: DC

## 2016-08-18 NOTE — Telephone Encounter (Signed)
Pt needs to have a glucose meter sent in for her.  Also, Pt states that she has not been taking Metformin for a week due to insurance issues but she got it straightened out so she should be going back on it.   Thanks [employer]

## 2016-08-18 NOTE — Telephone Encounter (Signed)
LVM letting pt know glucometer has been sent to pharmacy

## 2016-08-27 ENCOUNTER — Ambulatory Visit: Payer: Medicare Other | Admitting: Family

## 2016-08-28 ENCOUNTER — Ambulatory Visit (INDEPENDENT_AMBULATORY_CARE_PROVIDER_SITE_OTHER): Payer: PPO | Admitting: Family

## 2016-08-28 ENCOUNTER — Encounter: Payer: Self-pay | Admitting: Family

## 2016-08-28 VITALS — BP 170/110 | HR 67 | Temp 98.1°F | Resp 16 | Ht 63.0 in | Wt 157.0 lb

## 2016-08-28 DIAGNOSIS — F4321 Adjustment disorder with depressed mood: Secondary | ICD-10-CM | POA: Diagnosis not present

## 2016-08-28 DIAGNOSIS — I10 Essential (primary) hypertension: Secondary | ICD-10-CM | POA: Diagnosis not present

## 2016-08-28 DIAGNOSIS — Z634 Disappearance and death of family member: Secondary | ICD-10-CM

## 2016-08-28 MED ORDER — AMLODIPINE BESYLATE 10 MG PO TABS: 10.0000 mg | ORAL_TABLET | Freq: Every day | ORAL | 1 refills | Status: DC

## 2016-08-28 MED ORDER — CARVEDILOL 3.125 MG PO TABS: 3.1250 mg | ORAL_TABLET | Freq: Two times a day (BID) | ORAL | 1 refills | Status: DC

## 2016-08-28 MED ORDER — CLONIDINE HCL 0.1 MG PO TABS: 0.1000 mg | ORAL_TABLET | Freq: Two times a day (BID) | ORAL | 1 refills | Status: DC

## 2016-08-28 MED ORDER — LOSARTAN POTASSIUM 100 MG PO TABS: 100.0000 mg | ORAL_TABLET | Freq: Every day | ORAL | 1 refills | Status: DC

## 2016-08-28 MED ORDER — SERTRALINE HCL 50 MG PO TABS: 50.0000 mg | ORAL_TABLET | Freq: Every day | ORAL | 3 refills | Status: DC

## 2016-08-28 NOTE — Progress Notes (Signed)
Subjective:    Patient ID: Meredith Phillips, female    DOB: 12-02-1945, 71 y.o.   MRN: [id number]  Chief Complaint  Patient presents with  . Follow-up    wants to go to a counselor    HPI:  Meredith Phillips is a 71 y.o. female who  has a past medical history of Arthritis; Benign essential HTN (02/18/2015); Cellulitis (02/18/2015); Chicken pox; Diabetes mellitus without complication ([place]); Diabetic peripheral neuropathy ([place]); GERD (gastroesophageal reflux disease); Hypercholesteremia; Hypertension; Liver disease; and Polyp of colon. and presents today for a follow up office visit.  1.) Grief reaction - Previously noted to have normal grieving related to the loss of her daughter with previous declination of counseling and medication. Continues to experience the associated symptom of grief with an increased frequency of crying and thoughts of missing her daughter. Denies suicidal ideations. Severity of symptoms affecting her day-to-day lifestyle and is overtaken by periods of sadness and crying.  2.) Hypertension - currently prescribed amlodipine, carvedilol, clonidine, and losartan. Reports that she has been out of medication for several weeks and not checking her blood pressures at home. Denies worst headache of life with any symptoms of end organ damage. Has not been following a low-sodium diet. Physical activity has been limited.  BP Readings from Last 3 Encounters:  08/28/16 (!) 170/110  07/29/16 (!) 152/92  05/13/16 (!) 186/102     Allergies  Allergen Reactions  . Codeine   . Other     Doesn't like to take pain medication due to hallucinations.  [name] Shellfish Allergy       Outpatient Medications Prior to Visit  Medication Sig Dispense Refill  . blood glucose meter kit and supplies KIT Dispense based on patient and insurance preference. Use up to four times daily as directed. (FOR ICD-9 250.00, 250.01). 1 each 0  . Cholecalciferol (VITAMIN D-3) 1000 UNITS CAPS Take 1,000 Units by  mouth.    . clobetasol cream (TEMOVATE) 3.66 % Apply 1 application topically 2 (two) times daily. Do not use for more than two weeks at one time 30 g 2  . dicyclomine (BENTYL) 10 MG capsule Take 10 mg by mouth as needed for spasms.    [name] esomeprazole (NEXIUM) 40 MG capsule Take 1 capsule (40 mg total) by mouth daily at 12 noon. 90 capsule 0  . gabapentin (NEURONTIN) 300 MG capsule TAKE 1 CAPSULE (300 MG TOTAL) BY MOUTH 3 (THREE) TIMES DAILY. 90 capsule 3  . metFORMIN (GLUMETZA) 500 MG (MOD) 24 hr tablet Take 1 tablet (500 mg total) by mouth 2 (two) times daily. 180 tablet 0  . polycarbophil (FIBERCON) 625 MG tablet Take 625 mg by mouth.    . ranitidine (ZANTAC) 150 MG tablet TAKE 1 TABLET (150 MG TOTAL) BY MOUTH 2 (TWO) TIMES DAILY. 180 tablet 0  . simvastatin (ZOCOR) 20 MG tablet TAKE 1 TABLET BY MOUTH EVERY DAY 90 tablet 1  . traMADol (ULTRAM) 50 MG tablet Take 1 tablet (50 mg total) by mouth every 8 (eight) hours as needed. 60 tablet 0  . triamcinolone (NASACORT AQ) 55 MCG/ACT AERO nasal inhaler Place 2 sprays into the nose daily. 1 Inhaler 12  . cloNIDine (CATAPRES) 0.1 MG tablet Take 1 tablet (0.1 mg total) by mouth 2 (two) times daily. 60 tablet 5  . losartan (COZAAR) 100 MG tablet Take 1 tablet (100 mg total) by mouth daily. 90 tablet 1  . amLODipine (NORVASC) 10 MG tablet TAKE 1 TABLET (10 MG TOTAL)  BY MOUTH DAILY. (Patient not taking: Reported on 08/28/2016) 90 tablet 1  . carvedilol (COREG) 3.125 MG tablet Take 1 tablet (3.125 mg total) by mouth 2 (two) times daily with a meal. (Patient not taking: Reported on 08/28/2016) 60 tablet 5   No facility-administered medications prior to visit.      Review of Systems  Constitutional: Negative for appetite change, diaphoresis, fatigue, fever and unexpected weight change.  Respiratory: Negative for chest tightness and shortness of breath.   Cardiovascular: Negative for chest pain, palpitations and leg swelling.  Neurological: Negative for  dizziness and light-headedness.  Psychiatric/Behavioral: Negative for decreased concentration and sleep disturbance. The patient is not nervous/anxious and is not hyperactive.       Objective:    BP (!) 170/110 (BP Location: Left Arm, Patient Position: Sitting, Cuff Size: Normal)   Pulse 67   Temp 98.1 F (36.7 C) (Oral)   Resp 16   Ht '5\' 3"'  (1.6 m)   Wt 157 lb (71.2 kg)   SpO2 96%   BMI 27.81 kg/m  Nursing note and vital signs reviewed.  Physical Exam  Constitutional: She is oriented to person, place, and time. She appears well-developed and well-nourished. No distress.  Cardiovascular: Normal rate, regular rhythm, normal heart sounds and intact distal pulses.   Pulmonary/Chest: Effort normal and breath sounds normal.  Neurological: She is alert and oriented to person, place, and time.  Skin: Skin is warm and dry.  Psychiatric: She has a normal mood and affect. Her behavior is normal. Judgment and thought content normal.       Assessment & Plan:   Problem List Items Addressed This Visit      Cardiovascular and Mediastinum   Benign essential HTN    Hypertension is uncontrolled with patient not taking medications currently. Emphasize importance of taking medication as prescribed to reduce symptoms of end organ damage in the future. Denies worse headache of life with no new symptoms of end organ damage noted on physical exam. Encouraged to monitor blood pressure at home and follow low-sodium diet. Restart losartan, clonidine, carvedilol, and amlodipine. Follow-up in 2 weeks or sooner if needed for blood pressure check.      Relevant Medications   losartan (COZAAR) 100 MG tablet   carvedilol (COREG) 3.125 MG tablet   amLODipine (NORVASC) 10 MG tablet   cloNIDine (CATAPRES) 0.1 MG tablet     Other   Grief at loss of child - Primary    Symptoms concerning for grief turning into complicated grief with continued feelings of depression and worsening overall. Start sertraline.  Referral sent for counseling. Denies suicidal ideations. Advised to seek emergency care if thoughts of suicide develop. Follow-up in one month or sooner.      Relevant Medications   sertraline (ZOLOFT) 50 MG tablet   Other Relevant Orders   Ambulatory referral to Psychology       I am having Ms. Meredith Phillips start on sertraline. I am also having her maintain her dicyclomine, Vitamin D-3, polycarbophil, clobetasol cream, esomeprazole, traMADol, gabapentin, triamcinolone, ranitidine, simvastatin, metFORMIN, blood glucose meter kit and supplies, losartan, carvedilol, amLODipine, and cloNIDine.   Meds ordered this encounter  Medications  . losartan (COZAAR) 100 MG tablet    Sig: Take 1 tablet (100 mg total) by mouth daily.    Dispense:  90 tablet    Refill:  1    Order Specific Question:   Supervising Provider    Answer:   Pricilla Holm A [[id number]]  . carvedilol (  COREG) 3.125 MG tablet    Sig: Take 1 tablet (3.125 mg total) by mouth 2 (two) times daily with a meal.    Dispense:  180 tablet    Refill:  1    Order Specific Question:   Supervising Provider    Answer:   Pricilla Holm A [[id number]]  . amLODipine (NORVASC) 10 MG tablet    Sig: Take 1 tablet (10 mg total) by mouth daily.    Dispense:  90 tablet    Refill:  1    Order Specific Question:   Supervising Provider    Answer:   Pricilla Holm A [[id number]]  . cloNIDine (CATAPRES) 0.1 MG tablet    Sig: Take 1 tablet (0.1 mg total) by mouth 2 (two) times daily.    Dispense:  180 tablet    Refill:  1    Order Specific Question:   Supervising Provider    Answer:   Pricilla Holm A [[id number]]  . sertraline (ZOLOFT) 50 MG tablet    Sig: Take 1 tablet (50 mg total) by mouth daily.    Dispense:  30 tablet    Refill:  3    Order Specific Question:   Supervising Provider    Answer:   Pricilla Holm A [[id number]]     Follow-up: Return in about 1 month (around 09/28/2016), or if symptoms worsen or fail to improve.  Mauricio Po, FNP

## 2016-08-28 NOTE — Assessment & Plan Note (Addendum)
Hypertension is uncontrolled with patient not taking medications currently. Emphasize importance of taking medication as prescribed to reduce symptoms of end organ damage in the future. Denies worse headache of life with no new symptoms of end organ damage noted on physical exam. Encouraged to monitor blood pressure at home and follow low-sodium diet. Restart losartan, clonidine, carvedilol, and amlodipine. Follow-up in 2 weeks or sooner if needed for blood pressure check.

## 2016-08-28 NOTE — Assessment & Plan Note (Addendum)
Symptoms concerning for grief turning into complicated grief with continued feelings of depression and worsening overall. Start sertraline. Referral sent for counseling. Denies suicidal ideations. Advised to seek emergency care if thoughts of suicide develop. Follow-up in one month or sooner.

## 2016-08-28 NOTE — Patient Instructions (Addendum)
Thank you for choosing [facility].  SUMMARY AND INSTRUCTIONS:  Monitor your blood pressure at home.  Follow a low sodium diet.  They are going to call to schedule your appointment with the counselors.   If thoughts of suicide develop please seek emergency care.   Medication:  Please start taking the Zoloft to help with your mood.  Continue to take your blood pressure medications.   Your prescription(s) have been submitted to your pharmacy or been printed and provided for you. Please take as directed and contact our office if you believe you are having problem(s) with the medication(s) or have any questions.  Referrals:  Referrals have been made during this visit. You should expect to hear back from our schedulers in about 7-10 days in regards to establishing an appointment with the specialists we discussed.   Follow up:  If your symptoms worsen or fail to improve, please contact our office for further instruction, or in case of emergency go directly to the emergency room at the closest medical facility.    Complicated Grieving Grief is a normal response to the death of someone close to you. Feelings of fear, anger, and guilt can affect almost everyone who loses a loved one. It is also common to have symptoms of depression while you are grieving. These include problems with sleep, loss of appetite, and lack of energy. They may last for weeks or months after a loss. Complicated grief is different from normal grief or depression. Normal grieving involves sadness and feelings of loss, but these feelings are not constant. Complicated grief is a constant and severe type of grief. It interferes with your ability to function normally. It may last for several months to a year or longer. Complicated grief may require treatment from a mental health care provider. What are the causes? It is not known why some people continue to struggle with grief and others do not. You may be at higher  risk for complicated grief if:  The death of your loved one was sudden or unexpected.  The death of your loved one was due to a violent event.  Your loved one committed suicide.  Your loved one was a child or a young person.  You were very close to or dependent on the loved one.  You have a history of depression. What are the signs or symptoms? Signs and symptoms of complicated grief may include:  Feeling disbelief or numbness.  Being unable to enjoy good memories of your loved one.  Needing to avoid anything that reminds you of your loved one.  Being unable to stop thinking about the death.  Feeling intense anger or guilt.  Feeling alone and hopeless.  Feeling that your life is meaningless and empty.  Losing the desire to live. How is this diagnosed? Your health care provider may diagnose complicated grief if:  You have constant symptoms of grief for 6-12 months or longer.  Your symptoms are interfering with your ability to live your life. Your health care provider may want you to see a mental health care provider. Many symptoms of depression are similar to the symptoms of complicated grief. It is important to be evaluated for complicated grief along with other mental health conditions. How is this treated? Talk therapy with a mental health provider is the most common treatment for complicated grief. During therapy, you will learn healthy ways to cope with the loss of your loved one. In some cases, your mental health care provider may also  recommend antidepressant medicines. Follow these instructions at home:  Take care of yourself.  Eat regular meals and maintain a healthy diet. Eat plenty of fruits, vegetables, and whole grains.  Try to get some exercise each day.  Keep regular hours for sleep. Try to get at least 8 hours of sleep each night.  Do not use drugs or alcohol to ease your symptoms.  Take medicines only as directed by your health care  provider.  Spend time with friends and loved ones.  Consider joining a grief (bereavement) support group to help you deal with your loss.  Keep all follow-up visits as directed by your health care provider. This is important. Contact a health care provider if:  Your symptoms keep you from functioning normally.  Your symptoms do not get better with treatment. Get help right away if:  You have serious thoughts of hurting yourself or someone else.  You have suicidal feelings. This information is not intended to replace advice given to you by your health care provider. Make sure you discuss any questions you have with your health care provider. Document Released: 06/02/2005 Document Revised: 11/08/2015 Document Reviewed: 11/10/2013 Elsevier Interactive Patient Education  2017 [employer].

## 2016-09-03 ENCOUNTER — Telehealth: Payer: Self-pay | Admitting: Family

## 2016-09-03 NOTE — Telephone Encounter (Signed)
Message  Received: Today  Message Contents  Terri L Slaugenhaupt, CCC-SLP  Janee Morn        FYI: Your patient is scheduled to see Salomon Fick on 09/29/16.  Pt is aware.   Thank you for the referral.

## 2016-09-22 ENCOUNTER — Telehealth: Payer: Self-pay | Admitting: Family

## 2016-09-22 ENCOUNTER — Other Ambulatory Visit: Payer: Self-pay | Admitting: *Deleted

## 2016-09-22 DIAGNOSIS — I10 Essential (primary) hypertension: Secondary | ICD-10-CM

## 2016-09-22 MED ORDER — CLONIDINE HCL 0.1 MG PO TABS: 0.1000 mg | ORAL_TABLET | Freq: Two times a day (BID) | ORAL | 1 refills | Status: DC

## 2016-09-22 NOTE — Telephone Encounter (Signed)
[facility] referred pt to a therapists and it needs prior authorization. Please advise.

## 2016-09-24 NOTE — Telephone Encounter (Signed)
Pt already has an appointment set up 

## 2016-09-29 ENCOUNTER — Ambulatory Visit (INDEPENDENT_AMBULATORY_CARE_PROVIDER_SITE_OTHER): Payer: PPO | Admitting: Psychology

## 2016-09-29 DIAGNOSIS — F4321 Adjustment disorder with depressed mood: Secondary | ICD-10-CM

## 2016-10-09 ENCOUNTER — Ambulatory Visit (INDEPENDENT_AMBULATORY_CARE_PROVIDER_SITE_OTHER): Payer: PPO | Admitting: Family

## 2016-10-09 ENCOUNTER — Encounter: Payer: Self-pay | Admitting: Family

## 2016-10-09 VITALS — BP 132/82 | HR 63 | Temp 97.9°F | Resp 16 | Ht 63.0 in | Wt 155.4 lb

## 2016-10-09 DIAGNOSIS — J069 Acute upper respiratory infection, unspecified: Secondary | ICD-10-CM | POA: Diagnosis not present

## 2016-10-09 MED ORDER — ESOMEPRAZOLE MAGNESIUM 40 MG PO CPDR: 40.0000 mg | DELAYED_RELEASE_CAPSULE | Freq: Every day | ORAL | 0 refills | Status: DC

## 2016-10-09 NOTE — Patient Instructions (Signed)
Thank you for choosing [facility].  SUMMARY AND INSTRUCTIONS:  Start taking the Xyzal daily in the evening.  Nasacort - 2 sprays in each nostril once daily.  Medication:  Your prescription(s) have been submitted to your pharmacy or been printed and provided for you. Please take as directed and contact our office if you believe you are having problem(s) with the medication(s) or have any questions.   Follow up:  If your symptoms worsen or fail to improve, please contact our office for further instruction, or in case of emergency go directly to the emergency room at the closest medical facility.

## 2016-10-09 NOTE — Progress Notes (Signed)
Subjective:    Patient ID: Meredith Phillips, female    DOB: 29-Feb-1946, 71 y.o.   MRN: [id number]  Chief Complaint  Patient presents with  . Cough    x2 weeks, has had cough, lost her voice for 2 days last week, has had some congestion, wants something for allergies    HPI:  Meredith Phillips is a 71 y.o. female who  has a past medical history of Arthritis; Benign essential HTN (02/18/2015); Cellulitis (02/18/2015); Chicken pox; Diabetes mellitus without complication ([place]); Diabetic peripheral neuropathy ([place]); GERD (gastroesophageal reflux disease); Hypercholesteremia; Hypertension; Liver disease; and Polyp of colon. and presents today for an office visit.  This is a new problem. Associated symptom of a cough has been going on for about 2 weeks. Describes losing her voice for a couple of days last week and having some continued congestion. No fevers. Modifying factors include Coricidin HBP, teas, and over the counter medications. Course of the symptoms have improved since initial onset. No recent antibiotics.                                                                                             Allergies  Allergen Reactions  . Codeine   . Other     Doesn't like to take pain medication due to hallucinations.  [name] Shellfish Allergy       Outpatient Medications Prior to Visit  Medication Sig Dispense Refill  . amLODipine (NORVASC) 10 MG tablet Take 1 tablet (10 mg total) by mouth daily. 90 tablet 1  . blood glucose meter kit and supplies KIT Dispense based on patient and insurance preference. Use up to four times daily as directed. (FOR ICD-9 250.00, 250.01). 1 each 0  . carvedilol (COREG) 3.125 MG tablet Take 1 tablet (3.125 mg total) by mouth 2 (two) times daily with a meal. 180 tablet 1  . Cholecalciferol (VITAMIN D-3) 1000 UNITS CAPS Take 1,000 Units by mouth.    . clobetasol cream (TEMOVATE) 2.87 % Apply 1 application topically 2 (two) times daily. Do not use for more than two weeks  at one time 30 g 2  . cloNIDine (CATAPRES) 0.1 MG tablet Take 1 tablet (0.1 mg total) by mouth 2 (two) times daily. 180 tablet 1  . dicyclomine (BENTYL) 10 MG capsule Take 10 mg by mouth as needed for spasms.    [name] gabapentin (NEURONTIN) 300 MG capsule TAKE 1 CAPSULE (300 MG TOTAL) BY MOUTH 3 (THREE) TIMES DAILY. 90 capsule 3  . losartan (COZAAR) 100 MG tablet Take 1 tablet (100 mg total) by mouth daily. 90 tablet 1  . metFORMIN (GLUMETZA) 500 MG (MOD) 24 hr tablet Take 1 tablet (500 mg total) by mouth 2 (two) times daily. 180 tablet 0  . polycarbophil (FIBERCON) 625 MG tablet Take 625 mg by mouth.    . ranitidine (ZANTAC) 150 MG tablet TAKE 1 TABLET (150 MG TOTAL) BY MOUTH 2 (TWO) TIMES DAILY. 180 tablet 0  . sertraline (ZOLOFT) 50 MG tablet Take 1 tablet (50 mg total) by mouth daily. 30 tablet 3  . simvastatin (ZOCOR) 20 MG tablet TAKE 1  TABLET BY MOUTH EVERY DAY 90 tablet 1  . traMADol (ULTRAM) 50 MG tablet Take 1 tablet (50 mg total) by mouth every 8 (eight) hours as needed. 60 tablet 0  . triamcinolone (NASACORT AQ) 55 MCG/ACT AERO nasal inhaler Place 2 sprays into the nose daily. 1 Inhaler 12  . esomeprazole (NEXIUM) 40 MG capsule Take 1 capsule (40 mg total) by mouth daily at 12 noon. 90 capsule 0   No facility-administered medications prior to visit.       Past Surgical History:  Procedure Laterality Date  . APPENDECTOMY    . HERNIA REPAIR    . KNEE SURGERY    . ROTATOR CUFF REPAIR    . TONSILLECTOMY        Past Medical History:  Diagnosis Date  . Arthritis   . Benign essential HTN 02/18/2015  . Cellulitis 02/18/2015  . Chicken pox   . Diabetes mellitus without complication ([place])   . Diabetic peripheral neuropathy ([place])   . GERD (gastroesophageal reflux disease)   . Hypercholesteremia   . Hypertension   . Liver disease   . Polyp of colon       Review of Systems  Constitutional: Negative for chills and fever.  HENT: Positive for congestion. Negative for sinus pain,  sinus pressure and sore throat.   Respiratory: Positive for cough. Negative for chest tightness and shortness of breath.   Neurological: Negative for headaches.      Objective:    BP 132/82 (BP Location: Left Arm, Patient Position: Sitting, Cuff Size: Normal)   Pulse 63   Temp 97.9 F (36.6 C) (Oral)   Resp 16   Ht '5\' 3"'  (1.6 m)   Wt 155 lb 6.4 oz (70.5 kg)   SpO2 98%   BMI 27.53 kg/m  Nursing note and vital signs reviewed.  Physical Exam  Constitutional: She is oriented to person, place, and time. She appears well-developed and well-nourished. No distress.  HENT:  Right Ear: Hearing, tympanic membrane, external ear and ear canal normal.  Left Ear: Hearing, tympanic membrane, external ear and ear canal normal.  Nose: Nose normal.  Mouth/Throat: Uvula is midline, oropharynx is clear and moist and mucous membranes are normal.  Increased cerumen noted bilaterally.   Cardiovascular: Normal rate, regular rhythm, normal heart sounds and intact distal pulses.  Exam reveals no gallop and no friction rub.   No murmur heard. Pulmonary/Chest: Effort normal and breath sounds normal. No respiratory distress. She has no wheezes. She has no rales. She exhibits no tenderness.  Neurological: She is alert and oriented to person, place, and time.  Skin: Skin is warm and dry.  Psychiatric: She has a normal mood and affect. Her behavior is normal. Judgment and thought content normal.       Assessment & Plan:   Problem List Items Addressed This Visit      Respiratory   Acute upper respiratory infection - Primary    Symptoms and exam consistent with acute upper respiratory infection with underlying allergic rhinitis. Start Xyzal. Refill Nasacort. Continue with other over-the-counter medications as needed for symptom relief and supportive care. Follow-up if symptoms worsen or do not improve.          I am having Ms. Sarvis maintain her dicyclomine, Vitamin D-3, polycarbophil, clobetasol  cream, traMADol, gabapentin, triamcinolone, ranitidine, simvastatin, metFORMIN, blood glucose meter kit and supplies, losartan, carvedilol, amLODipine, sertraline, cloNIDine, and esomeprazole.   Meds ordered this encounter  Medications  . esomeprazole (NEXIUM) 40 MG capsule  Sig: Take 1 capsule (40 mg total) by mouth daily at 12 noon.    Dispense:  90 capsule    Refill:  0     Follow-up: Return if symptoms worsen or fail to improve.  Mauricio Po, FNP

## 2016-10-09 NOTE — Assessment & Plan Note (Signed)
Symptoms and exam consistent with acute upper respiratory infection with underlying allergic rhinitis. Start Xyzal. Refill Nasacort. Continue with other over-the-counter medications as needed for symptom relief and supportive care. Follow-up if symptoms worsen or do not improve.

## 2016-10-16 ENCOUNTER — Ambulatory Visit (INDEPENDENT_AMBULATORY_CARE_PROVIDER_SITE_OTHER): Payer: PPO | Admitting: Psychology

## 2016-10-16 DIAGNOSIS — F4321 Adjustment disorder with depressed mood: Secondary | ICD-10-CM | POA: Diagnosis not present

## 2016-11-07 ENCOUNTER — Ambulatory Visit: Payer: PPO | Admitting: Psychology

## 2016-11-16 ENCOUNTER — Other Ambulatory Visit: Payer: Self-pay | Admitting: Family

## 2016-11-18 ENCOUNTER — Other Ambulatory Visit: Payer: Self-pay | Admitting: Family

## 2016-11-20 ENCOUNTER — Ambulatory Visit: Payer: PPO | Admitting: Psychology

## 2016-11-21 ENCOUNTER — Telehealth: Payer: Self-pay

## 2016-11-21 NOTE — Telephone Encounter (Signed)
PA approved for Metformin 500mg  24hr tablet , pharmacy contacted and aware

## 2016-11-21 NOTE — Telephone Encounter (Signed)
PA submitted for Metformin HCL ER  Key: [id number]

## 2016-12-15 ENCOUNTER — Other Ambulatory Visit: Payer: Self-pay

## 2016-12-15 NOTE — Patient Outreach (Signed)
    This RNCM was successful in making telephone contact with patient who readily agreed to participate in this screening call. Patient states she experiences a great amount of pain in her knee, states she was told sometime ago she needed surgery. Patient also discussed she does not go to the doctor a lot because someone told her it would mess with her insurance coverage. Patient reports her husband is her Legal Guardian, he was named after her daughter died in 12-03-17at the age of 74 years. Patient states her daughters death caused her to become very depressed.  This RNCM will make referral for community care  Coordination to assist patient with understanding need for preventive care, assist patient with chronic disease education and connecting to community resources

## 2016-12-19 ENCOUNTER — Ambulatory Visit: Payer: Self-pay | Admitting: *Deleted

## 2016-12-20 ENCOUNTER — Other Ambulatory Visit: Payer: Self-pay | Admitting: Family

## 2016-12-20 DIAGNOSIS — Z634 Disappearance and death of family member: Principal | ICD-10-CM

## 2016-12-20 DIAGNOSIS — F4321 Adjustment disorder with depressed mood: Secondary | ICD-10-CM

## 2016-12-23 ENCOUNTER — Ambulatory Visit (INDEPENDENT_AMBULATORY_CARE_PROVIDER_SITE_OTHER): Payer: PPO | Admitting: Family

## 2016-12-23 ENCOUNTER — Encounter: Payer: Self-pay | Admitting: Family

## 2016-12-23 ENCOUNTER — Other Ambulatory Visit (INDEPENDENT_AMBULATORY_CARE_PROVIDER_SITE_OTHER): Payer: PPO

## 2016-12-23 DIAGNOSIS — R195 Other fecal abnormalities: Secondary | ICD-10-CM

## 2016-12-23 DIAGNOSIS — M2550 Pain in unspecified joint: Secondary | ICD-10-CM | POA: Diagnosis not present

## 2016-12-23 LAB — CBC
HEMATOCRIT: 38.4 % (ref 36.0–46.0)
HEMOGLOBIN: 12.7 g/dL (ref 12.0–15.0)
MCHC: 33.1 g/dL (ref 30.0–36.0)
MCV: 87.8 fl (ref 78.0–100.0)
Platelets: 378 10*3/uL (ref 150.0–400.0)
RBC: 4.37 Mil/uL (ref 3.87–5.11)
RDW: 13.8 % (ref 11.5–15.5)
WBC: 6.4 10*3/uL (ref 4.0–10.5)

## 2016-12-23 NOTE — Patient Instructions (Addendum)
Thank you for choosing [facility].  SUMMARY AND INSTRUCTIONS:  For your joints - Ice x 20 minutes every 2 hours and as needed following activity.  Tumeric 500 mg daily to help with inflammation.   Please bring a stool sample to the lab to check for blood.   We will check your white/red blood cells today.   Recommend Tylenol 650 mg 3x daily for the next 5-7 days and then as needed.   Follow up with Dr. Veda Canning for your knee.   Labs:  Please stop by the lab on the lower level of the building for your blood work. Your results will be released to MyChart (or called to you) after review, usually within 72 hours after test completion. If any changes need to be made, you will be notified at that same time.  1.) The lab is open from 7:30am to 5:30 pm Monday-Friday 2.) No appointment is necessary 3.) Fasting (if needed) is 6-8 hours after food and drink; black coffee and water are okay   Follow up:  If your symptoms worsen or fail to improve, please contact our office for further instruction, or in case of emergency go directly to the emergency room at the closest medical facility.

## 2016-12-23 NOTE — Progress Notes (Signed)
Subjective:    Patient ID: Meredith Phillips, female    DOB: [birth year], 71 y.o.   MRN: [id number]  Chief Complaint  Patient presents with  . Joint Pain    has joint pains all over, right hip is bothering her, left knee pain, black stools     HPI:  Meredith Phillips is a 71 y.o. female who  has a past medical history of Arthritis; Benign essential HTN (02/18/2015); Cellulitis (02/18/2015); Chicken pox; Diabetes mellitus without complication ([place]); Diabetic peripheral neuropathy ([place]); GERD (gastroesophageal reflux disease); Hypercholesteremia; Hypertension; Liver disease; and Polyp of colon. and presents today for an acute office visit.  1.) Dark stools -  This is a new problem. Associated symptom of dark stools has been gong on for about 1 week. Described as black in color. Denies use of NSAIDs, aspirin or alcohol did use some pepto bismol. Describes a stomach cramping at times. Does have significant history of diverticulitis. No nausea, vomiting, diarrhea or constipation.   2.) Multiple joint pains - Continues to experience the associated symptom of pain located in her multiple joints including her back, right hip and left knee. She has known "bone-on-bone" osteoarthritis in the left knee. Continues to experience increasing levels of pain in her right hip that is progressing proximally towards her back. t  Allergies  Allergen Reactions  . Codeine   . Other     Doesn't like to take pain medication due to hallucinations.  [name] Shellfish Allergy       Outpatient Medications Prior to Visit  Medication Sig Dispense Refill  . amLODipine (NORVASC) 10 MG tablet Take 1 tablet (10 mg total) by mouth daily. 90 tablet 1  . blood glucose meter kit and supplies KIT Dispense based on patient and insurance preference. Use up to four times daily as directed. (FOR ICD-9 250.00, 250.01). 1 each 0  . carvedilol (COREG) 3.125 MG tablet TAKE 1 TABLET (3.125 MG TOTAL) BY MOUTH 2 (TWO) TIMES DAILY WITH A MEAL. 60  tablet 5  . Cholecalciferol (VITAMIN D-3) 1000 UNITS CAPS Take 1,000 Units by mouth.    . clobetasol cream (TEMOVATE) 3.74 % Apply 1 application topically 2 (two) times daily. Do not use for more than two weeks at one time 30 g 2  . cloNIDine (CATAPRES) 0.1 MG tablet Take 1 tablet (0.1 mg total) by mouth 2 (two) times daily. 180 tablet 1  . dicyclomine (BENTYL) 10 MG capsule Take 10 mg by mouth as needed for spasms.    [name] esomeprazole (NEXIUM) 40 MG capsule Take 1 capsule (40 mg total) by mouth daily at 12 noon. 90 capsule 0  . gabapentin (NEURONTIN) 300 MG capsule TAKE 1 CAPSULE (300 MG TOTAL) BY MOUTH 3 (THREE) TIMES DAILY. 90 capsule 3  . gabapentin (NEURONTIN) 300 MG capsule TAKE 1 CAPSULE (300 MG TOTAL) BY MOUTH 3 (THREE) TIMES DAILY. 90 capsule 2  . losartan (COZAAR) 100 MG tablet Take 1 tablet (100 mg total) by mouth daily. 90 tablet 1  . metFORMIN (GLUMETZA) 500 MG (MOD) 24 hr tablet Take 1 tablet (500 mg total) by mouth 2 (two) times daily. 180 tablet 0  . polycarbophil (FIBERCON) 625 MG tablet Take 625 mg by mouth.    . ranitidine (ZANTAC) 150 MG tablet TAKE 1 TABLET (150 MG TOTAL) BY MOUTH 2 (TWO) TIMES DAILY. 180 tablet 0  . sertraline (ZOLOFT) 50 MG tablet TAKE 1 TABLET (50 MG TOTAL) BY MOUTH DAILY. 30 tablet 3  . simvastatin (ZOCOR)  20 MG tablet TAKE 1 TABLET BY MOUTH EVERY DAY 90 tablet 1  . traMADol (ULTRAM) 50 MG tablet Take 1 tablet (50 mg total) by mouth every 8 (eight) hours as needed. 60 tablet 0  . triamcinolone (NASACORT AQ) 55 MCG/ACT AERO nasal inhaler Place 2 sprays into the nose daily. 1 Inhaler 12  . carvedilol (COREG) 3.125 MG tablet Take 1 tablet (3.125 mg total) by mouth 2 (two) times daily with a meal. 180 tablet 1   No facility-administered medications prior to visit.       Past Surgical History:  Procedure Laterality Date  . APPENDECTOMY    . HERNIA REPAIR    . KNEE SURGERY    . ROTATOR CUFF REPAIR    . TONSILLECTOMY        Past Medical History:    Diagnosis Date  . Arthritis   . Benign essential HTN 02/18/2015  . Cellulitis 02/18/2015  . Chicken pox   . Diabetes mellitus without complication ([place])   . Diabetic peripheral neuropathy ([place])   . GERD (gastroesophageal reflux disease)   . Hypercholesteremia   . Hypertension   . Liver disease   . Polyp of colon       Review of Systems  Constitutional: Negative for chills and fever.  Respiratory: Negative for chest tightness and shortness of breath.   Gastrointestinal: Negative for abdominal pain, blood in stool, constipation, diarrhea, nausea and vomiting.  Musculoskeletal: Positive for arthralgias.  Neurological: Negative for weakness and numbness.      Objective:    BP 134/88 (BP Location: Left Arm, Patient Position: Sitting, Cuff Size: Normal)   Pulse 67   Resp 16   Ht '5\' 3"'  (1.6 m)   Wt 153 lb (69.4 kg)   SpO2 97%   BMI 27.10 kg/m  Nursing note and vital signs reviewed.  Physical Exam  Constitutional: She is oriented to person, place, and time. She appears well-developed and well-nourished. No distress.  Cardiovascular: Normal rate, regular rhythm, normal heart sounds and intact distal pulses.   Pulmonary/Chest: Effort normal and breath sounds normal.  Abdominal: Normal appearance and bowel sounds are normal. She exhibits no mass. There is no hepatosplenomegaly. There is no tenderness. There is no rigidity, no rebound, no guarding, no tenderness at McBurney's point and negative Murphy's sign.  Neurological: She is alert and oriented to person, place, and time.  Skin: Skin is warm and dry.  Psychiatric: She has a normal mood and affect. Her behavior is normal. Judgment and thought content normal.       Assessment & Plan:   Problem List Items Addressed This Visit      Other   Dark stools    New onset dark stools with no abdominal pain and denies usage of aspirin, alcohol, iron supplements, and NSAIDs. There is concern for potential gastrointestinal bleed. Obtain  CBC and FOBT. Possibility from Pepto Bismol. Follow up pending lab work or if symptoms continue or worsen.       Relevant Orders   Fecal occult blood, imunochemical (IFOB)   CBC (Completed)   Arthralgia    Arthralgias and increasing pain in the right hip/back likely associated with change in gait from primary osteoarthritis of the left knee. Recommend follow-up with orthopedics for possible knee replacement surgery. Continue with conservative treatment pending follow-up with orthopedics.          I am having Ms. Cendejas maintain her dicyclomine, Vitamin D-3, polycarbophil, clobetasol cream, traMADol, gabapentin, triamcinolone, ranitidine, simvastatin, metFORMIN, blood  glucose meter kit and supplies, losartan, amLODipine, cloNIDine, esomeprazole, gabapentin, carvedilol, and sertraline.   No orders of the defined types were placed in this encounter.    Follow-up: No Follow-up on file.  Mauricio Po, FNP

## 2016-12-23 NOTE — Assessment & Plan Note (Signed)
Arthralgias and increasing pain in the right hip/back likely associated with change in gait from primary osteoarthritis of the left knee. Recommend follow-up with orthopedics for possible knee replacement surgery. Continue with conservative treatment pending follow-up with orthopedics.

## 2016-12-23 NOTE — Assessment & Plan Note (Addendum)
New onset dark stools with no abdominal pain and denies usage of aspirin, alcohol, iron supplements, and NSAIDs. There is concern for potential gastrointestinal bleed. Obtain CBC and FOBT. Possibility from Pepto Bismol. Follow up pending lab work or if symptoms continue or worsen.

## 2016-12-24 ENCOUNTER — Other Ambulatory Visit: Payer: Self-pay | Admitting: Family

## 2016-12-24 DIAGNOSIS — F4321 Adjustment disorder with depressed mood: Secondary | ICD-10-CM

## 2016-12-24 DIAGNOSIS — Z634 Disappearance and death of family member: Principal | ICD-10-CM

## 2016-12-25 ENCOUNTER — Other Ambulatory Visit: Payer: Self-pay | Admitting: *Deleted

## 2016-12-25 NOTE — Patient Outreach (Signed)
[facility]) Care Management  12/25/2016  Carmen Tolliver RSWNIO [birth year] [id number]  RN attempted outreach call to pt today however unsuccessful. RN able to leave a HIPAA approved voice message. Will await a call back and/or continue outreach calls accordingly for [facility] services.  Elliot Cousin, RN Care Management Coordinator [facility] Main Office [phone]

## 2016-12-30 ENCOUNTER — Encounter: Payer: Self-pay | Admitting: *Deleted

## 2016-12-30 ENCOUNTER — Other Ambulatory Visit: Payer: Self-pay | Admitting: *Deleted

## 2016-12-30 DIAGNOSIS — I10 Essential (primary) hypertension: Secondary | ICD-10-CM

## 2016-12-30 NOTE — Patient Outreach (Signed)
[facility]) Care Management  12/30/2016  Meredith Phillips OINOMV 1945/09/08 [id number]   Referral received for community case management  RN spoke with pt today and reintroduced the [facility] services and verified identifiers. Pt receptive to today's call as RN explained the purpose for today's call and discuss possible services. Pt expressed her needs during the initial telephonic assessment. Based upon the information provided RN made a referral to [facility] pharmacy due to the lack of medications pt was not administering. Pt indicated she has loss her daughter and was referred for counseling however could not afford the co-pays. RN inquired on another resource with no added fees at pt agreed to Hospice services. RN assisted with arranging for pt to review bereavement counseling from [facility] RN contacted the agency and spoke with Meredith Phillips and inquired further on these services. Pt provided this information for counseling and express how appreciative she was for the resources. Other issues discussed related to pt's medical history related to HTN and diabetes. Pt reported her diabetes is under control however her HTN has been up and down in the readings based upon her bereavement for the loss of a child. RN educated pt on HTN and possible signs and symptoms that may occur and what to do if acute. Plan of care generated and discussed along with short and long term goals. Will reiterated on the care plan on the upcoming home visit. Offered Immunologist along with a home visit for a more one-on-one visit in assisting in managing her HTN (pt receptive). RN scheduled a home visit based upon pt's available date and time (next week). RN provided RN contact number if RN is needed prior to the scheduled appointment. Note the initial assessment has been completed.  Elliot Cousin, RN Care Management Coordinator [facility] Main Office [phone]

## 2016-12-31 ENCOUNTER — Other Ambulatory Visit: Payer: Self-pay

## 2016-12-31 ENCOUNTER — Encounter: Payer: Self-pay | Admitting: Pharmacist

## 2016-12-31 ENCOUNTER — Telehealth: Payer: Self-pay | Admitting: Pharmacist

## 2016-12-31 MED ORDER — BLOOD GLUCOSE MONITOR KIT: PACK | 0 refills | Status: DC

## 2016-12-31 NOTE — Patient Outreach (Signed)
[facility]) Care Management  [place]   12/31/2016  Meredith Phillips 08-03-1945 [id number]  Subjective: Called patient regarding medication management per referral from [place], Raina Mina.  HIPAA identifiers were obtained.  Patient is a 71 year old female with multiple medical conditions including but not limited to:  Osteoarthritis of knee, GERD, type 2 diabetes, hyperlipidemia, hypertension, chronic back pain and depression (secondary to the loss of her adult daughter in November of 2017).  Patient reported managing her medications on her own and using a pill box.  She said she does not have any issues with medication affordability because she receives "extra help".  [facility]    Objective:   Encounter Medications: Outpatient Encounter Prescriptions as of 12/31/2016  Medication Sig Note  . amLODipine (NORVASC) 10 MG tablet Take 1 tablet (10 mg total) by mouth daily.   . blood glucose meter kit and supplies KIT Dispense based on patient and insurance preference. Use up to four times daily as directed. (FOR ICD-9 250.00, 250.01).   . carvedilol (COREG) 3.125 MG tablet TAKE 1 TABLET (3.125 MG TOTAL) BY MOUTH 2 (TWO) TIMES DAILY WITH A MEAL.   [name] Cholecalciferol (VITAMIN D-3) 1000 UNITS CAPS Take 1,000 Units by mouth.   . cloNIDine (CATAPRES) 0.1 MG tablet Take 1 tablet (0.1 mg total) by mouth 2 (two) times daily.   [name] dicyclomine (BENTYL) 10 MG capsule Take 10 mg by mouth as needed for spasms.   [name] esomeprazole (NEXIUM) 40 MG capsule Take 1 capsule (40 mg total) by mouth daily at 12 noon.   . gabapentin (NEURONTIN) 300 MG capsule TAKE 1 CAPSULE (300 MG TOTAL) BY MOUTH 3 (THREE) TIMES DAILY.   [name] losartan (COZAAR) 100 MG tablet Take 1 tablet (100 mg total) by mouth daily.   . metFORMIN (GLUMETZA) 500 MG (MOD) 24 hr tablet Take 1 tablet (500 mg total) by mouth 2 (two) times daily.   . sertraline (ZOLOFT) 50 MG tablet TAKE 1 TABLET  (50 MG TOTAL) BY MOUTH DAILY.   [name] triamcinolone (NASACORT AQ) 55 MCG/ACT AERO nasal inhaler Place 2 sprays into the nose daily.   . clobetasol cream (TEMOVATE) 5.28 % Apply 1 application topically 2 (two) times daily. Do not use for more than two weeks at one time (Patient not taking: Reported on 12/30/2016)   . simvastatin (ZOCOR) 20 MG tablet TAKE 1 TABLET BY MOUTH EVERY DAY (Patient not taking: Reported on 12/31/2016)   . traMADol (ULTRAM) 50 MG tablet Take 1 tablet (50 mg total) by mouth every 8 (eight) hours as needed. (Patient not taking: Reported on 12/31/2016)   . [DISCONTINUED] gabapentin (NEURONTIN) 300 MG capsule TAKE 1 CAPSULE (300 MG TOTAL) BY MOUTH 3 (THREE) TIMES DAILY. (Patient not taking: Reported on 12/30/2016)   . [DISCONTINUED] polycarbophil (FIBERCON) 625 MG tablet Take 625 mg by mouth. 06/09/2015: Received from: [facility]  . [DISCONTINUED] ranitidine (ZANTAC) 150 MG tablet TAKE 1 TABLET (150 MG TOTAL) BY MOUTH 2 (TWO) TIMES DAILY. (Patient not taking: Reported on 12/31/2016)   . [DISCONTINUED] sertraline (ZOLOFT) 50 MG tablet TAKE 1 TABLET (50 MG TOTAL) BY MOUTH DAILY.    No facility-administered encounter medications on file as of 12/31/2016.     Functional Status: In your present state of health, do you have any difficulty performing the following activities: 12/30/2016 02/12/2016  Hearing? N N  Vision? N N  Difficulty concentrating or making decisions? N N  Walking or climbing stairs? N Y  Dressing or bathing? N N  Doing errands, shopping? Y N  Preparing Food and eating ? N -  Using the Toilet? N -  In the past six months, have you accidently leaked urine? N -  Do you have problems with loss of bowel control? N -  Managing your Medications? N -  Managing your Finances? N -  Housekeeping or managing your Housekeeping? N -  Some recent data might be hidden    Fall/Depression Screening: Fall Risk  12/30/2016 02/12/2016 12/26/2014  Falls in the past  year? No No No   PHQ 2/9 Scores 12/30/2016 02/12/2016 12/26/2014  PHQ - 2 Score 0 0 0      Assessment: Patient's medications were reconciled over the phone.     Drugs sorted by system:  Neurologic/Psychologic: Sertraline Gabapentin  Cardiovascular: Losartan Carvedilol Simvastatin Amlodipine Clonidine  Pulmonary/Respiratory Nasacort Nasal Spray  Gastrointestinal: Nexium Dicyclomine  Endocrine: Metformin  Topical: Clobetasol  Pain: Tramadol-patient reported not taking  Medication Review Findings:  1.  Adherence- Simvastatin-patient reported she had not taken simvastatin in a very long time.  [facility] was last filled 04/15/16 for a 90 day supply.  [facility] also [facility] an active prescription for simvastatin with 1 refill on file.  Patient's last LDL was 55 (2016)  Patient reported some general medication non-adherence right after her daughter passed in November of last year but says she is back on the "right track now".  She was excited about [place] Nurse Raina Mina referring her to Hospice for grief counseling.   Plan:  1.  Route note to provider about simvastatin 2.  Follow up with the patient in 7-14 days about simvastatin.  Elayne Guerin, PharmD, [facility] Clinical Pharmacist [phone]

## 2016-12-31 NOTE — Telephone Encounter (Signed)
-----   Message from Veryl Speak, FNP sent at 12/31/2016  2:56 PM EDT ----- Thank you for the email. There is no indication that it was to be stopped and she should continue to take it at this time.  Thank you for your assistance.  Regards,  Greg ----- Message ----- From: Beecher Mcardle, [facility] Sent: 12/31/2016  11:47 AM To: Veryl Speak, FNP  Mr. Carver Fila,   Please see the attached [facility] Pharmacist's note.  Medication review was completed with the patient over the phone.  She has not had simvastatin filled since 04/15/16.  [facility] one [facility] on [facility] with one refill.  Patient said she could not remember if simvastatin was stopped or why she has not been taking it.  I will be following up with the patient in 7-10 days.  Would you like for her to restart Simvastatin?     Thank you so much for your time and consideration.   Blessings,  Beecher Mcardle, PharmD, [facility] Clinical Pharmacist [phone]

## 2016-12-31 NOTE — Patient Outreach (Signed)
[facility]) Care Management  12/31/2016  Meredith Phillips [id number] [birth year] [id number]  Patient's PCP messaged me back confirming the patient should be taking simvastatin.  Patient was called. HIPAA identifiers were obtained.  This was explained to the patient who requested [facility] on [facility] and [facility] the [facility]atin[facility]  [facility] of Simvastatin 20mg  will be filled for the patient.  Patient confirmed she will pick up the prescription later this evening.   Plan:  Continue with previous follow up.  , PharmD, BCACP [facility] Clinical Pharmacist [phone]

## 2017-01-06 ENCOUNTER — Encounter: Payer: Self-pay | Admitting: *Deleted

## 2017-01-06 ENCOUNTER — Telehealth: Payer: Self-pay | Admitting: Family

## 2017-01-06 ENCOUNTER — Other Ambulatory Visit: Payer: Self-pay | Admitting: *Deleted

## 2017-01-06 NOTE — Telephone Encounter (Signed)
Patient Name: KHAMARI YOUSUF  DOB: Jul 05, 1945    Initial Comment Caller says, Elliot Cousin Nurse [phone] case mgr w/ [facility], home health nurse agency, for patient Marirose Deveney [birth year] , wanted to report some blood pressure readings.    Nurse Assessment  Nurse: Stefano Gaul, RN, Vera Date/Time (Eastern Time): 01/06/2017 1:00:50 PM  Confirm and document reason for call. If symptomatic, describe symptoms. ---Caller states she is Elliot Cousin case manager with [facility] and wants to let the doctor know about some low BP readings. She takes 3-4 different BP medications. She is not having any symptoms other than she is a little more weak today. BP on manual cuff left arm 84/54; right arm 92/58; automatic cuff; left arm 95/68; right arm 100/64. Would like to know if doctor wants to order any parameters regarding BP. Pt has been instructed to drink fluids and report any symptoms.  Does the patient have any new or worsening symptoms? ---No  Please document clinical information provided and list any resource used. ---Advised caller that on call physician would be sent a note regarding BP readings. Verbalized understanding.     Guidelines    Guideline Title Affirmed Question Affirmed Notes       Final Disposition User   Clinical Call Stefano Gaul, RN, Dwana Curd    Comments  -- meeting protocol has [place] call their back line , but no one answers -- charge indicates we needed to make it a triage

## 2017-01-06 NOTE — Patient Outreach (Addendum)
[facility]) Care Management   01/06/2017  [place] Mills DTOIZT 29-Dec-1945 [id number]  Meredith Phillips is an 71 y.o. female  Subjective:  THN: Pt receptive to enrolling into the [facility] program and services HTN: Pt not aware of signs or possible symptoms of HTN or what to do if acute symptoms occur. Pt inquired on assistance on using her home device today to compare BP readings. Pt denies any symptoms after education on this medical condition today. Pt states she has not been taking her BP daily but will start after education today and aware of the risk.  Pt states she looks forward to the ongoing education concerning her medical condition and pleased with the information provided today.  GI: Pt states she has issues with ocassional constipation and requested possible remedies OTC. Pt states she bears down a lot of times due to the constipation not aware of the risk involved. Currently pt does not take anything for her constipation.  MEDICATIONS: Pt not aware of all her medication and the purpose. Pt states some medication she has and some she does not have and has not been taking as recommended.  Objective:   Review of Systems  Constitutional: Negative.   HENT: Negative.   Eyes: Negative.   Respiratory: Negative.   Cardiovascular: Negative.   Gastrointestinal: Negative.   Genitourinary: Negative.   Musculoskeletal: Negative.   Skin: Negative.   Neurological: Negative.   Endo/Heme/Allergies: Negative.   Psychiatric/Behavioral: Negative.     Physical Exam  Constitutional: She is oriented to person, place, and time. She appears well-developed and well-nourished.  HENT:  Right Ear: External ear normal.  Left Ear: External ear normal.  Eyes: EOM are normal.  Neck: Normal range of motion.  Cardiovascular: Normal heart sounds.   Respiratory: Effort normal and breath sounds normal.  GI: Soft. Bowel sounds are normal.  Musculoskeletal: Normal range of motion.  Neurological:  She is alert and oriented to person, place, and time.  Skin: Skin is warm and dry.  Psychiatric: She has a normal mood and affect. Her behavior is normal. Judgment and thought content normal.    Encounter Medications:   Outpatient Encounter Prescriptions as of 01/06/2017  Medication Sig  . amLODipine (NORVASC) 10 MG tablet Take 1 tablet (10 mg total) by mouth daily.  . blood glucose meter kit and supplies KIT Dispense based on patient and insurance preference. Use up to four times daily as directed. (FOR ICD-9 250.00, 250.01).  . blood glucose meter kit and supplies KIT Dispense based on patient and insurance preference. Use up to four times daily as directed. E11.9 .  . carvedilol (COREG) 3.125 MG tablet TAKE 1 TABLET (3.125 MG TOTAL) BY MOUTH 2 (TWO) TIMES DAILY WITH A MEAL.  [name] Cholecalciferol (VITAMIN D-3) 1000 UNITS CAPS Take 1,000 Units by mouth.  . clobetasol cream (TEMOVATE) 3.82 % Apply 1 application topically 2 (two) times daily. Do not use for more than two weeks at one time  . cloNIDine (CATAPRES) 0.1 MG tablet Take 1 tablet (0.1 mg total) by mouth 2 (two) times daily.  [name] dicyclomine (BENTYL) 10 MG capsule Take 10 mg by mouth as needed for spasms.  [name] gabapentin (NEURONTIN) 300 MG capsule TAKE 1 CAPSULE (300 MG TOTAL) BY MOUTH 3 (THREE) TIMES DAILY.  [name] losartan (COZAAR) 100 MG tablet Take 1 tablet (100 mg total) by mouth daily.  . metFORMIN (GLUMETZA) 500 MG (MOD) 24 hr tablet Take 1 tablet (500 mg total) by mouth 2 (two)  times daily.  . sertraline (ZOLOFT) 50 MG tablet TAKE 1 TABLET (50 MG TOTAL) BY MOUTH DAILY.  . simvastatin (ZOCOR) 20 MG tablet TAKE 1 TABLET BY MOUTH EVERY DAY  . traMADol (ULTRAM) 50 MG tablet Take 1 tablet (50 mg total) by mouth every 8 (eight) hours as needed.  . triamcinolone (NASACORT AQ) 55 MCG/ACT AERO nasal inhaler Place 2 sprays into the nose daily.  [name] esomeprazole (NEXIUM) 40 MG capsule Take 1 capsule (40 mg total) by mouth daily at 12 noon. (Patient not  taking: Reported on 01/06/2017)   No facility-administered encounter medications on file as of 01/06/2017.     Functional Status:   In your present state of health, do you have any difficulty performing the following activities: 12/30/2016 02/12/2016  Hearing? N N  Vision? N N  Difficulty concentrating or making decisions? N N  Walking or climbing stairs? N Y  Dressing or bathing? N N  Doing errands, shopping? Y N  Preparing Food and eating ? N -  Using the Toilet? N -  In the past six months, have you accidently leaked urine? N -  Do you have problems with loss of bowel control? N -  Managing your Medications? N -  Managing your Finances? N -  Housekeeping or managing your Housekeeping? N -  Some recent data might be hidden    Fall/Depression Screening:    Fall Risk  12/30/2016 02/12/2016 12/26/2014  Falls in the past year? No No No   PHQ 2/9 Scores 12/30/2016 02/12/2016 12/26/2014  PHQ - 2 Score 0 0 0   BP (!) 92/58 (BP Location: Left Arm, Patient Position: Sitting, Cuff Size: Normal)   Pulse 60   Resp 18   Ht 1.6 m ('5\' 3"' )   Wt 153 lb (69.4 kg)   SpO2 98%   BMI 27.10 kg/m  Assessment:   Enrollment into the [facility] program and services Case management related to hypotention GI related to ocassional constipation Medication adherence  Plan:  Will discussed once again in detail the [facility] program and services. Will obtain a signed consent and verified enrolling with community case management services. Will educate on HTN and discussed the printed EMMI material for increase knowledge base for pt's understanding in managing her medical condition. Will educate and discuss in detail on the following( About high Blood Pressure (Hypertension), Controlling your blood pressure through lifestyle and DASH diet). Will obtain bilateral BP both manually and with pt's home device-Due to readings will contact pt's provider concerning the readings today. Will verify pt remains asymptomatic with her  readings -Manual read to R-arm 92/58 L-arm 84/54 and automatic home device L-95/68 and R-100/64. Will encouraged pt to hydrate more today to avoid weakness and to report any symptoms if no improve to her provider. Will verify pt is aware of acute symptom and what to do if acute symptoms occur. Will discuss plan of care and goals related with pt's understanding. Will outreach to Dr. Elna Breslow with the above vitals today for possible intervention. RN left message and currently awaiting a call back form the office.  Will discuss OTC medication (Smooth Move tea) that may assist with pt's constipation. In addition to prune juice and stress hydration to assist with movement.  Will stress to pt not to bear down when attempting to have a bowel movement due to the risk involved.  Will review all medications and education on the purpose of her medications. Will note her medications and verify understanding. Will also encouraged  pt to obtain the missing medication (Nexium) and the importance of this medication (pt with a clear understanding). Will notify primary provider with today's home visit report and barriers letter. Will discuss next home visit and schedule accordingly.  Raina Mina, RN Care Management Coordinator [place] Office [phone]

## 2017-01-07 ENCOUNTER — Other Ambulatory Visit: Payer: Self-pay | Admitting: Pharmacist

## 2017-01-07 NOTE — Patient Outreach (Signed)
[facility]) Care Management  01/07/2017  Meredith Phillips RDEYCX 14-Jul-1945 [id number]   Patient was called to follow up on medication adherence.  She had not been taking simvastatin due to running out of refills. Pharmacy and provider were contacted on patient's behalf last week.    Unfortunately, patient did not answer the phone today. HIPAA compliant message left on her voicemail.  Plan:  Call patient back in 3-5 business days.  Beecher Mcardle, PharmD, BCACP [facility] Clinical Pharmacist [phone]

## 2017-01-09 ENCOUNTER — Other Ambulatory Visit: Payer: Self-pay | Admitting: Family

## 2017-01-12 ENCOUNTER — Other Ambulatory Visit: Payer: Self-pay | Admitting: Pharmacist

## 2017-01-12 NOTE — Patient Outreach (Addendum)
[facility]) Care Management  01/12/2017  Meredith Phillips [id number] 12/16/1945 [id number]   Patient was called to follow up on medication adherence.  She had not been taking simvastatin due to running out of refills. Pharmacy and provider were contacted on patient's behalf last week.    Unfortunately, patient did not answer the phone today. HIPAA compliant message left on her voicemail.  Plan:  Call patient back in 5-7 business days.  Beecher Mcardle, PharmD, BCACP [facility] Clinical Pharmacist [phone]   ADDENDUM  [facility] Community Nurse, Elliot Cousin, called and said patient missed my call and called her.  I called the patient back and the phone went to voicemail.  Another HIPAA compliant message was left on the patient's voicemail.  Plan:  Call patient back in 5-7 business days.   Beecher Mcardle, PharmD, BCACP [facility] Clinical Pharmacist [phone]

## 2017-01-14 ENCOUNTER — Other Ambulatory Visit: Payer: Self-pay | Admitting: Family

## 2017-01-19 ENCOUNTER — Other Ambulatory Visit: Payer: Self-pay | Admitting: Pharmacist

## 2017-01-19 NOTE — Patient Outreach (Addendum)
[facility]) Care Management  01/19/2017  Meredith Phillips MOQHUT 03-01-1946 [id number]  Patient was called to follow up on medication adherence. She had not been taking simvastatin due to running out of refills.  Unfortunately, patient did not answer the phone today. HIPAA compliant message left on her voicemail.  [facility] to check to see if the patient picked up her medication.  The pharmacist confirmed Simvastatin 20 mg was picked up on 01/01/17 for a 90 day supply.    Plan:  Send patient an unsuccessful contact letter Close the pharmacy case if I do not hear back from her within 10 business days.   Beecher Mcardle, PharmD, BCACP [facility] Clinical Pharmacist [phone]

## 2017-01-23 ENCOUNTER — Other Ambulatory Visit: Payer: Self-pay | Admitting: Family

## 2017-01-24 ENCOUNTER — Other Ambulatory Visit: Payer: Self-pay | Admitting: Family

## 2017-01-28 ENCOUNTER — Other Ambulatory Visit: Payer: Self-pay | Admitting: Family

## 2017-01-29 ENCOUNTER — Other Ambulatory Visit: Payer: Self-pay | Admitting: Family

## 2017-01-30 ENCOUNTER — Other Ambulatory Visit: Payer: Self-pay | Admitting: Pharmacist

## 2017-01-30 NOTE — Patient Outreach (Signed)
[facility]) Care Management  01/30/2017  Colene Mines WLNLGX [birth year] [id number]   Patient was called at least three time with unsuccessful contact. The issue pharmacy was following was adherence to simvastatin. The patient's pharmacy was called and it was confirmed the patient picked up the refill of simvastatin.    Plan: Pharmacy case will be closed.  Patient is still active with [facility] Community Case Management.  An inbasket message will be sent to patient's Care Management Nurse, Elliot Cousin.    Beecher Mcardle, PharmD, BCACP [facility] Clinical Pharmacist [phone]

## 2017-02-03 ENCOUNTER — Other Ambulatory Visit: Payer: Self-pay | Admitting: Family

## 2017-02-03 ENCOUNTER — Other Ambulatory Visit: Payer: Self-pay | Admitting: *Deleted

## 2017-02-03 DIAGNOSIS — I10 Essential (primary) hypertension: Secondary | ICD-10-CM

## 2017-02-03 NOTE — Patient Outreach (Signed)
[facility]) Care Management   02/03/2017  Iyah Laguna DUKGUR [birth year] [id number]  Meredith Phillips is an 71 y.o. female  Subjective:  HTN: Pt states her BP has been elevated on her readings with no symptoms experienced. Pt states most of her morning readings were taken before her BP medications and the readings were elevated. Pt states she believe she is putting her cuff on to tight resulting in elevated readings.  Pt reports documenting all her BP readings as discussed on last home visit. Pt states she will readjust her cuff not to be to tight with taking her BP. MEDICATIONS: Pt requested review of her BP medications.  Reports refilling her Losartan and continues to fill her pill box with no reported issues.   Objective:   Review of Systems  Constitutional: Negative.   HENT: Negative.   Eyes: Negative.   Respiratory: Negative.   Cardiovascular: Negative.   Gastrointestinal: Negative.   Genitourinary: Negative.   Musculoskeletal: Negative.   Skin: Negative.   Neurological: Negative.   Endo/Heme/Allergies: Negative.   Psychiatric/Behavioral: Negative.     Physical Exam  Constitutional: She is oriented to person, place, and time. She appears well-developed and well-nourished.  HENT:  Right Ear: External ear normal.  Left Ear: External ear normal.  Eyes: EOM are normal.  Neck: Normal range of motion.  Cardiovascular: Normal heart sounds.   Respiratory: Effort normal and breath sounds normal.  GI: Soft. Bowel sounds are normal.  Musculoskeletal: Normal range of motion.  Neurological: She is alert and oriented to person, place, and time.  Skin: Skin is warm and dry.  Psychiatric: She has a normal mood and affect. Her behavior is normal. Judgment and thought content normal.    Encounter Medications:   Outpatient Encounter Prescriptions as of 02/03/2017  Medication Sig  . amLODipine (NORVASC) 10 MG tablet Take 1 tablet (10 mg total) by mouth daily.  . blood  glucose meter kit and supplies KIT Dispense based on patient and insurance preference. Use up to four times daily as directed. (FOR ICD-9 250.00, 250.01).  . blood glucose meter kit and supplies KIT Dispense based on patient and insurance preference. Use up to four times daily as directed. E11.9 .  . carvedilol (COREG) 3.125 MG tablet TAKE 1 TABLET (3.125 MG TOTAL) BY MOUTH 2 (TWO) TIMES DAILY WITH A MEAL.  [name] Cholecalciferol (VITAMIN D-3) 1000 UNITS CAPS Take 1,000 Units by mouth.  . clobetasol cream (TEMOVATE) 2.83 % Apply 1 application topically 2 (two) times daily. Do not use for more than two weeks at one time  . cloNIDine (CATAPRES) 0.1 MG tablet Take 1 tablet (0.1 mg total) by mouth 2 (two) times daily.  [name] dicyclomine (BENTYL) 10 MG capsule Take 10 mg by mouth as needed for spasms.  [name] esomeprazole (NEXIUM) 40 MG capsule TAKE 1 CAPSULE (40 MG TOTAL) BY MOUTH DAILY AT 12 NOON.  [name] gabapentin (NEURONTIN) 300 MG capsule TAKE 1 CAPSULE (300 MG TOTAL) BY MOUTH 3 (THREE) TIMES DAILY.  [name] losartan (COZAAR) 100 MG tablet Take 1 tablet (100 mg total) by mouth daily.  . metFORMIN (GLUMETZA) 500 MG (MOD) 24 hr tablet Take 1 tablet (500 mg total) by mouth 2 (two) times daily.  . ONE TOUCH ULTRA TEST test strip TEST UP TO 4 TIMES DAILY AS DIRECTED  . ONETOUCH DELICA LANCETS FINE MISC TEST UP TO 4 TIMES DAILY AS DIRECTED  . sertraline (ZOLOFT) 50 MG tablet TAKE 1 TABLET (50 MG TOTAL) BY MOUTH  DAILY.  . simvastatin (ZOCOR) 20 MG tablet TAKE 1 TABLET BY MOUTH EVERY DAY  . triamcinolone (NASACORT AQ) 55 MCG/ACT AERO nasal inhaler Place 2 sprays into the nose daily.  . traMADol (ULTRAM) 50 MG tablet Take 1 tablet (50 mg total) by mouth every 8 (eight) hours as needed. (Patient not taking: Reported on 02/03/2017)   No facility-administered encounter medications on file as of 02/03/2017.     Functional Status:   In your present state of health, do you have any difficulty performing the following activities:  12/30/2016 02/12/2016  Hearing? N N  Vision? N N  Difficulty concentrating or making decisions? N N  Walking or climbing stairs? N Y  Dressing or bathing? N N  Doing errands, shopping? Y N  Preparing Food and eating ? N -  Using the Toilet? N -  In the past six months, have you accidently leaked urine? N -  Do you have problems with loss of bowel control? N -  Managing your Medications? N -  Managing your Finances? N -  Housekeeping or managing your Housekeeping? N -  Some recent data might be hidden    Fall/Depression Screening:    Fall Risk  12/30/2016 02/12/2016 12/26/2014  Falls in the past year? No No No   PHQ 2/9 Scores 12/30/2016 02/12/2016 12/26/2014  PHQ - 2 Score 0 0 0   BP 130/72 (BP Location: Left Arm, Patient Position: Sitting, Cuff Size: Normal)   Pulse 60   Resp 20   Ht 1.6 m ('5\' 3"' )   Wt 150 lb (68 kg)   SpO2 98%   BMI 26.57 kg/m  Assessment:   Ongoing case management related to HTN Follow up on adherence related to daily documentation of BP Follow up on adherence related to medications  Plan:  Will verify pt continues to monitor her BP with her home device. Will compare readings today for adequacy of readings. Will verified and review all readings of elevated BP and what pt should do if acute symptoms occur. Will verify pt has not experienced any symptoms related to hypo-hypertension.  Will review all readings and troubleshoot pt placement of her home BP device for a more adequate read (pt was placing the cuff to tight with elevated readings). Will verify pt's BP device is working correctly after education today. Will verify pt is taking her medications as prescribed. Will review all BP medication and educated on the purpose of each medication and why it is importance to comply with her providers recommendations (pt with a clear understanding). Plan of care discussed along with goals as pt will continue to work on meeting the goals discussed today. Will verify pt  remains receptive to the ongoing care plan. Will scheduled next month's home visit for ongoing case management services.  [facility] calendar and reviewed educational information and encouraged pt to document all her readings of her BP and her diabetes readouts for her providers to view.   Raina Mina, RN Care Management Coordinator [place] Office [phone]

## 2017-02-10 ENCOUNTER — Other Ambulatory Visit: Payer: Self-pay | Admitting: Family

## 2017-02-10 ENCOUNTER — Ambulatory Visit (INDEPENDENT_AMBULATORY_CARE_PROVIDER_SITE_OTHER): Payer: PPO | Admitting: Family

## 2017-02-10 ENCOUNTER — Encounter: Payer: Self-pay | Admitting: Family

## 2017-02-10 VITALS — BP 158/100 | HR 65 | Resp 16 | Ht 63.0 in | Wt 156.0 lb

## 2017-02-10 DIAGNOSIS — M545 Low back pain, unspecified: Secondary | ICD-10-CM | POA: Insufficient documentation

## 2017-02-10 DIAGNOSIS — I10 Essential (primary) hypertension: Secondary | ICD-10-CM | POA: Diagnosis not present

## 2017-02-10 MED ORDER — HYDROCHLOROTHIAZIDE 12.5 MG PO TABS: 12.5000 mg | ORAL_TABLET | Freq: Every day | ORAL | 1 refills | Status: DC

## 2017-02-10 MED ORDER — PREDNISONE 10 MG (21) PO TBPK: ORAL_TABLET | ORAL | 0 refills | Status: DC

## 2017-02-10 NOTE — Progress Notes (Signed)
Subjective:    Patient ID: Meredith Phillips, female    DOB: [birth year], 71 y.o.   MRN: [id number]  Chief Complaint  Patient presents with  . Back Pain    having right lower back pain, hypertension    HPI:  Meredith Phillips is a 71 y.o. female who  has a past medical history of Arthritis; Benign essential HTN (02/18/2015); Cellulitis (02/18/2015); Chicken pox; Diabetes mellitus without complication ([place]); Diabetic peripheral neuropathy ([place]); GERD (gastroesophageal reflux disease); Hypercholesteremia; Hypertension; Liver disease; and Polyp of colon. and presents today for an office visit.  1.) Right lower back pain - Continues to experience the associated symptom of pain located on the right side of her lumbar spine that has been going on for about 2-3 weeks with no trauma or injury that she is aware. Denies any modifying factors or attempted treatments. No fevers. No changes to bowel or bladder habits or saddle anesthesia. Pain is described as achy and aggravated after sitting for periods of time. There is some pain that radiates down her leg at times.   2.) Hypertension - Currently prescribed amlodipine, carvedilol, clonidine, and losartan.  Reports taking the medications as prescribed and denies adverse side effects or hypotensive readings. Blood pressures at home have been labile both up and down. Denies changes in vision, worst headache of life or new symptoms of end organ damage. Working on following a low sodium diet.   BP Readings from Last 3 Encounters:  02/10/17 (!) 158/100  02/03/17 130/72  01/06/17 (!) 92/58     Allergies  Allergen Reactions  . Codeine Other (See Comments)    Woke up the next morning and did not know where she was  . Other     Doesn't like to take pain medication due to hallucinations.  [name] Shellfish Allergy       Outpatient Medications Prior to Visit  Medication Sig Dispense Refill  . amLODipine (NORVASC) 10 MG tablet Take 1 tablet (10 mg total) by mouth  daily. 90 tablet 1  . blood glucose meter kit and supplies KIT Dispense based on patient and insurance preference. Use up to four times daily as directed. (FOR ICD-9 250.00, 250.01). 1 each 0  . blood glucose meter kit and supplies KIT Dispense based on patient and insurance preference. Use up to four times daily as directed. E11.9 . 1 each 0  . carvedilol (COREG) 3.125 MG tablet TAKE 1 TABLET (3.125 MG TOTAL) BY MOUTH 2 (TWO) TIMES DAILY WITH A MEAL. 60 tablet 5  . Cholecalciferol (VITAMIN D-3) 1000 UNITS CAPS Take 1,000 Units by mouth.    . clobetasol cream (TEMOVATE) 7.37 % Apply 1 application topically 2 (two) times daily. Do not use for more than two weeks at one time 30 g 2  . cloNIDine (CATAPRES) 0.1 MG tablet Take 1 tablet (0.1 mg total) by mouth 2 (two) times daily. 180 tablet 1  . dicyclomine (BENTYL) 10 MG capsule Take 10 mg by mouth as needed for spasms.    [name] esomeprazole (NEXIUM) 40 MG capsule TAKE 1 CAPSULE (40 MG TOTAL) BY MOUTH DAILY AT 12 NOON. 90 capsule 0  . gabapentin (NEURONTIN) 300 MG capsule TAKE 1 CAPSULE (300 MG TOTAL) BY MOUTH 3 (THREE) TIMES DAILY. 90 capsule 3  . losartan (COZAAR) 100 MG tablet TAKE 1 TABLET BY MOUTH EVERY DAY 90 tablet 1  . metFORMIN (GLUMETZA) 500 MG (MOD) 24 hr tablet Take 1 tablet (500 mg total) by mouth 2 (two) times  daily. 180 tablet 0  . ONE TOUCH ULTRA TEST test strip TEST UP TO 4 TIMES DAILY AS DIRECTED 100 each 0  . ONETOUCH DELICA LANCETS FINE MISC TEST UP TO 4 TIMES DAILY AS DIRECTED 100 each 0  . sertraline (ZOLOFT) 50 MG tablet TAKE 1 TABLET (50 MG TOTAL) BY MOUTH DAILY. 30 tablet 3  . simvastatin (ZOCOR) 20 MG tablet TAKE 1 TABLET BY MOUTH EVERY DAY 90 tablet 1  . traMADol (ULTRAM) 50 MG tablet Take 1 tablet (50 mg total) by mouth every 8 (eight) hours as needed. 60 tablet 0  . triamcinolone (NASACORT AQ) 55 MCG/ACT AERO nasal inhaler Place 2 sprays into the nose daily. 1 Inhaler 12   No facility-administered medications prior to visit.         Past Surgical History:  Procedure Laterality Date  . APPENDECTOMY    . HERNIA REPAIR    . KNEE SURGERY    . ROTATOR CUFF REPAIR    . TONSILLECTOMY        Past Medical History:  Diagnosis Date  . Arthritis   . Benign essential HTN 02/18/2015  . Cellulitis 02/18/2015  . Chicken pox   . Diabetes mellitus without complication ([place])   . Diabetic peripheral neuropathy ([place])   . GERD (gastroesophageal reflux disease)   . Hypercholesteremia   . Hypertension   . Liver disease   . Polyp of colon     Review of Systems  Constitutional: Negative for chills and fever.  Eyes:       Negative for changes in vision  Respiratory: Negative for cough, chest tightness and wheezing.   Cardiovascular: Negative for chest pain, palpitations and leg swelling.  Musculoskeletal: Positive for back pain.  Neurological: Negative for dizziness, weakness and light-headedness.      Objective:    BP (!) 158/100 (BP Location: Left Arm, Patient Position: Sitting, Cuff Size: Normal)   Pulse 65   Resp 16   Ht '5\' 3"'  (1.6 m)   Wt 156 lb (70.8 kg)   SpO2 98%   BMI 27.63 kg/m  Nursing note and vital signs reviewed.  Physical Exam  Constitutional: She is oriented to person, place, and time. She appears well-developed and well-nourished. No distress.  Cardiovascular: Normal rate, regular rhythm, normal heart sounds and intact distal pulses.   Pulmonary/Chest: Effort normal and breath sounds normal.  Musculoskeletal:  Low back - no obvious deformity, discoloration, or edema. Tenderness of lumbar paraspinal muscle on the right side. Range of motion decreased in flexion secondary to discomfort all other motions are normal. Distal pulses and sensation are intact and appropriate. Negative Faber's and straight leg raise.   Neurological: She is alert and oriented to person, place, and time.  Skin: Skin is warm and dry.  Psychiatric: She has a normal mood and affect. Her behavior is normal. Judgment and  thought content normal.       Assessment & Plan:   Problem List Items Addressed This Visit      Cardiovascular and Mediastinum   Benign essential HTN    Blood pressure elevated above goal 140/90 with current medication regimen. Denies worse headache of life. No symptoms of end organ damage noted on physical exam. Start hydrochlorothiazide. Continue current dosage of carvedilol, clonidine, losartan, and amlodipine. Encouraged to monitor blood pressures at home and follow low-sodium diet. Follow-up in one month or sooner for blood pressure check.      Relevant Medications   hydrochlorothiazide (HYDRODIURIL) 12.5 MG tablet  Other   Right-sided low back pain without sciatica    Symptoms and exam consistent with right-sided low back pain without sciatica appears to be muscle related with lumbar paraspinal muscle spasms. Treat conservatively with ice/moist heat, home exercise therapy, and start prednisone taper. Follow-up if symptoms worsen or do not improve.      Relevant Medications   predniSONE (STERAPRED UNI-PAK 21 TAB) 10 MG (21) TBPK tablet       I am having Ms. Dreisbach start on predniSONE and hydrochlorothiazide. I am also having her maintain her dicyclomine, Vitamin D-3, clobetasol cream, traMADol, gabapentin, triamcinolone, simvastatin, metFORMIN, blood glucose meter kit and supplies, amLODipine, cloNIDine, carvedilol, sertraline, blood glucose meter kit and supplies, esomeprazole, ONETOUCH DELICA LANCETS FINE, ONE TOUCH ULTRA TEST, and losartan.   Meds ordered this encounter  Medications  . predniSONE (STERAPRED UNI-PAK 21 TAB) 10 MG (21) TBPK tablet    Sig: Take 6 tablets x 1 day, 5 tablets x 1 day, 4 tablets x 1 day, 3 tablets x 1 day, 2 tablets x 1 day, 1 tablet x 1 day    Dispense:  21 tablet    Refill:  0    Order Specific Question:   Supervising Provider    Answer:   Pricilla Holm A [[id number]]  . hydrochlorothiazide (HYDRODIURIL) 12.5 MG tablet    Sig: Take 1  tablet (12.5 mg total) by mouth daily.    Dispense:  30 tablet    Refill:  1    Order Specific Question:   Supervising Provider    Answer:   Pricilla Holm A [[id number]]     Follow-up: Return in about 1 month (around 03/13/2017), or if symptoms worsen or fail to improve.  Mauricio Po, FNP

## 2017-02-10 NOTE — Assessment & Plan Note (Signed)
Symptoms and exam consistent with right-sided low back pain without sciatica appears to be muscle related with lumbar paraspinal muscle spasms. Treat conservatively with ice/moist heat, home exercise therapy, and start prednisone taper. Follow-up if symptoms worsen or do not improve.

## 2017-02-10 NOTE — Assessment & Plan Note (Signed)
Blood pressure elevated above goal 140/90 with current medication regimen. Denies worse headache of life. No symptoms of end organ damage noted on physical exam. Start hydrochlorothiazide. Continue current dosage of carvedilol, clonidine, losartan, and amlodipine. Encouraged to monitor blood pressures at home and follow low-sodium diet. Follow-up in one month or sooner for blood pressure check.

## 2017-02-10 NOTE — Patient Instructions (Signed)
Thank you for choosing [facility].  SUMMARY AND INSTRUCTIONS:  Ice / moist heat x 20 minutes every other hour as needed following activity.   Start the prednisone taper.   Stretches and exercises throughout the day.   Start the hydrochlorothiazide and continue to take your other medications.  Monitor your blood pressures at home.  Follow up if symptoms worsen.   Medication:  Your prescription(s) have been submitted to your pharmacy or been printed and provided for you. Please take as directed and contact our office if you believe you are having problem(s) with the medication(s) or have any questions.   Follow up:  If your symptoms worsen or fail to improve, please contact our office for further instruction, or in case of emergency go directly to the emergency room at the closest medical facility.    Low Back Sprain Rehab Ask your health care provider which exercises are safe for you. Do exercises exactly as told by your health care provider and adjust them as directed. It is normal to feel mild stretching, pulling, tightness, or discomfort as you do these exercises, but you should stop right away if you feel sudden pain or your pain gets worse. Do not begin these exercises until told by your health care provider. Stretching and range of motion exercises These exercises warm up your muscles and joints and improve the movement and flexibility of your back. These exercises also help to relieve pain, numbness, and tingling. Exercise A: Lumbar rotation  1. Lie on your back on a firm surface and bend your knees. 2. Straighten your arms out to your sides so each arm forms an "L" shape with a side of your body (a 90 degree angle). 3. Slowly move both of your knees to one side of your body until you feel a stretch in your lower back. Try not to let your shoulders move off of the floor. 4. Hold for __________ seconds. 5. Tense your abdominal muscles and slowly move your knees back  to the starting position. 6. Repeat this exercise on the other side of your body. Repeat __________ times. Complete this exercise __________ times a day. Exercise B: Prone extension on elbows  1. Lie on your abdomen on a firm surface. 2. Prop yourself up on your elbows. 3. Use your arms to help lift your chest up until you feel a gentle stretch in your abdomen and your lower back. ? This will place some of your body weight on your elbows. If this is uncomfortable, try stacking pillows under your chest. ? Your hips should stay down, against the surface that you are lying on. Keep your hip and back muscles relaxed. 4. Hold for __________ seconds. 5. Slowly relax your upper body and return to the starting position. Repeat __________ times. Complete this exercise __________ times a day. Strengthening exercises These exercises build strength and endurance in your back. Endurance is the ability to use your muscles for a long time, even after they get tired. Exercise C: Pelvic tilt 1. Lie on your back on a firm surface. Bend your knees and keep your feet flat. 2. Tense your abdominal muscles. Tip your pelvis up toward the ceiling and flatten your lower back into the floor. ? To help with this exercise, you may place a small towel under your lower back and try to push your back into the towel. 3. Hold for __________ seconds. 4. Let your muscles relax completely before you repeat this exercise. Repeat __________ times. Complete this  exercise __________ times a day. Exercise D: Alternating arm and leg raises  1. Get on your hands and knees on a firm surface. If you are on a hard floor, you may want to use padding to cushion your knees, such as an exercise mat. 2. Line up your arms and legs. Your hands should be below your shoulders, and your knees should be below your hips. 3. Lift your left leg behind you. At the same time, raise your right arm and straighten it in front of you. ? Do not lift your  leg higher than your hip. ? Do not lift your arm higher than your shoulder. ? Keep your abdominal and back muscles tight. ? Keep your hips facing the ground. ? Do not arch your back. ? Keep your balance carefully, and do not hold your breath. 4. Hold for __________ seconds. 5. Slowly return to the starting position and repeat with your right leg and your left arm. Repeat __________ times. Complete this exercise __________ times a day. Exercise E: Abdominal set with straight leg raise  1. Lie on your back on a firm surface. 2. Bend one of your knees and keep your other leg straight. 3. Tense your abdominal muscles and lift your straight leg up, 4-6 inches (10-15 cm) off the ground. 4. Keep your abdominal muscles tight and hold for __________ seconds. ? Do not hold your breath. ? Do not arch your back. Keep it flat against the ground. 5. Keep your abdominal muscles tense as you slowly lower your leg back to the starting position. 6. Repeat with your other leg. Repeat __________ times. Complete this exercise __________ times a day. Posture and body mechanics  Body mechanics refers to the movements and positions of your body while you do your daily activities. Posture is part of body mechanics. Good posture and healthy body mechanics can help to relieve stress in your body's tissues and joints. Good posture means that your spine is in its natural S-curve position (your spine is neutral), your shoulders are pulled back slightly, and your head is not tipped forward. The following are general guidelines for applying improved posture and body mechanics to your everyday activities. Standing   When standing, keep your spine neutral and your feet about hip-width apart. Keep a slight bend in your knees. Your ears, shoulders, and hips should line up.  When you do a task in which you stand in one place for a long time, place one foot up on a stable object that is 2-4 inches (5-10 cm) high, such as a  footstool. This helps keep your spine neutral. Sitting   When sitting, keep your spine neutral and keep your feet flat on the floor. Use a footrest, if necessary, and keep your thighs parallel to the floor. Avoid rounding your shoulders, and avoid tilting your head forward.  When working at a desk or a computer, keep your desk at a height where your hands are slightly lower than your elbows. Slide your chair under your desk so you are close enough to maintain good posture.  When working at a computer, place your monitor at a height where you are looking straight ahead and you do not have to tilt your head forward or downward to look at the screen. Resting   When lying down and resting, avoid positions that are most painful for you.  If you have pain with activities such as sitting, bending, stooping, or squatting (flexion-based activities), lie in a position in which  your body does not bend very much. For example, avoid curling up on your side with your arms and knees near your chest (fetal position).  If you have pain with activities such as standing for a long time or reaching with your arms (extension-based activities), lie with your spine in a neutral position and bend your knees slightly. Try the following positions:  Lying on your side with a pillow between your knees.  Lying on your back with a pillow under your knees. Lifting   When lifting objects, keep your feet at least shoulder-width apart and tighten your abdominal muscles.  Bend your knees and hips and keep your spine neutral. It is important to lift using the strength of your legs, not your back. Do not lock your knees straight out.  Always ask for help to lift heavy or awkward objects. This information is not intended to replace advice given to you by your health care provider. Make sure you discuss any questions you have with your health care provider. Document Released: 06/02/2005 Document Revised: 02/07/2016 Document  Reviewed: 03/14/2015 Elsevier Interactive Patient Education  [employer].

## 2017-02-13 ENCOUNTER — Other Ambulatory Visit: Payer: Self-pay | Admitting: Family

## 2017-02-13 DIAGNOSIS — I10 Essential (primary) hypertension: Secondary | ICD-10-CM

## 2017-02-17 ENCOUNTER — Encounter (HOSPITAL_BASED_OUTPATIENT_CLINIC_OR_DEPARTMENT_OTHER): Payer: Self-pay

## 2017-02-17 ENCOUNTER — Emergency Department (HOSPITAL_BASED_OUTPATIENT_CLINIC_OR_DEPARTMENT_OTHER)
Admission: EM | Admit: 2017-02-17 | Discharge: 2017-02-17 | Disposition: A | Discharge status: Home / Self Care | Payer: PPO | Attending: Emergency Medicine | Admitting: Emergency Medicine

## 2017-02-17 DIAGNOSIS — Z5321 Procedure and treatment not carried out due to patient leaving prior to being seen by health care provider: Secondary | ICD-10-CM | POA: Diagnosis not present

## 2017-02-17 DIAGNOSIS — Z036 Encounter for observation for suspected toxic effect from ingested substance ruled out: Secondary | ICD-10-CM | POA: Insufficient documentation

## 2017-02-17 NOTE — ED Triage Notes (Addendum)
Pt states she has been taking her HCTZ 12.5mg  x 5/daily instead of 12.5mg  daily as prescribed x 1 week-pt states she has been under stress and misunderstood med direction-NAD-steady gait

## 2017-02-18 ENCOUNTER — Other Ambulatory Visit: Payer: Self-pay | Admitting: *Deleted

## 2017-02-18 NOTE — Patient Outreach (Signed)
[facility]) Care Management  02/18/2017  Meredith Phillips [id number] September 10, 1945 [id number]   Transition of care ED visit 9/4  RN spoke with pt and confirmed identifiers and inquired further on pt's recent visit to the ED. Pt reports she incorrect miss read her prescription and was taking to many of her BP medication. Pt reports she experienced some symptoms and needed to go to the ED. Pt was informed she was taking this medication incorrectly and was treated and discharged home with a stable readings. Pt further reports she is out of her BP medication since Sunday and unable to refill due to taking to many. RN stress the importance of her discharge instructions and taking all her medications unless informed otherwise. RN offered to assist to contact her provider and confirm this medication with additional refills however limited with no reported via EPIC at this time but pt is fully aware of why she was visiting the ED and can explain the incident in detail. Pt indicated she would contact her provider tomorrow and explain the incident with visiting the ED and not having enough BP medication for the month due to taking to many over the last several days. RN has also offered [facility] pharmacy if she needs a better organization and management with taking her medications (pt declined at this time). RN reminded pt of the upcoming home visit however informed pt RN remains available for acute visits or consult with [facility] pharmacy if needed when she is confused about her medications or can not decipher the correct amount. RN also encouraged pt to obtain assistance from her spouse if needed (pt with a clear understanding). Pt states she will contact her provider and received instructions and follow up with this RN with an update this week. No other request or inquires at this time.  Elliot Cousin, RN Care Management Coordinator [facility] Main Office [phone]

## 2017-02-19 ENCOUNTER — Other Ambulatory Visit: Payer: Self-pay | Admitting: Family

## 2017-02-20 ENCOUNTER — Other Ambulatory Visit: Payer: Self-pay | Admitting: *Deleted

## 2017-02-20 ENCOUNTER — Other Ambulatory Visit: Payer: Self-pay | Admitting: Family

## 2017-02-20 ENCOUNTER — Telehealth: Payer: Self-pay | Admitting: Family

## 2017-02-20 NOTE — Patient Outreach (Signed)
[facility]) Care Management  02/20/2017  Meredith Phillips DEYCXK 08-Aug-1945 [id number]   RN received a follow up call from pt this morning indicating she has spoke with her provider's office and informed to go to the ED however pt has informed the office if her symptoms get worse she will visit the ED. Pt states her spouse will return soon and she will discuss this matter with him in case she needs to go to the ED. Pt believe this incident has nothing to do with the medication error she has a few days ago but something new with pain near or around her kidney area. Pt states its tolerable for now but continues to bother her throughout the day and pt does not like to take any pain medication in fear of addiction. RN will strongly encouraged pt to see medical attention if her symptoms are not improving and reminded pt of the upcoming home visit this month. Pt aware to contact this RN if she has any additional inquires and to use the RN hotline if after hours or she is not sure of what to do however if acute or urgent to again seek medical attention immediately.  Elliot Cousin, RN Care Management Coordinator [facility] Main Office [phone]

## 2017-02-20 NOTE — Telephone Encounter (Signed)
Pt called stating that she misread the instructions on her BP medication. She said that this week she has been taking 2 in the morning before breakfast, 1 before lunch and 2 before bedtime each day. She started to feel sick on her stomach and realized what she had done. [facility] and they advised her to drink lots of water and go somewhere to be seen. She went to an ER [address] but was only able to have her BP checked because they were too busy and she had to get her grandson. She said that she is now experiencing pain on her right side towards the front. After speaking with Ladona Ridgel, we decided that she should be seen to discuss her medications. The pt was not able to come in because her husband is at work and she has to take her grandson to school and pick him up. She said that she only feels comfortable taking him to school because it is a straight drive there. She would not want to try to drive herself here and chance hurting herself or others and does not have anyone to help her during the day. She mentioned that she would see if she could have someone bring her tomorrow to the Saturday clinic. Tammy Sours also thought that this would be a good idea. I told her that the schedule opens at 1:00 today. I will call her to get this appointment scheduled at that time. I advised her that if she starts to show concerning symptoms like weakness, dizziness, etc then she should call 911 and be taken to the hospital.

## 2017-02-20 NOTE — Telephone Encounter (Signed)
Saturday clinic schedule is now open. I called the pt to see if she wanted to go ahead and schedule. She said that she does not have a ride for tomorrow so she will call when to schedule something when she has someone to bring her.

## 2017-02-24 ENCOUNTER — Other Ambulatory Visit: Payer: Self-pay

## 2017-02-24 MED ORDER — METFORMIN HCL ER 500 MG PO TB24: 500.0000 mg | ORAL_TABLET | Freq: Every day | ORAL | 1 refills | Status: DC

## 2017-02-27 ENCOUNTER — Other Ambulatory Visit: Payer: Self-pay

## 2017-02-27 MED ORDER — GLUCOSE BLOOD VI STRP: ORAL_STRIP | 3 refills | Status: DC

## 2017-03-05 ENCOUNTER — Other Ambulatory Visit: Payer: Self-pay | Admitting: *Deleted

## 2017-03-05 NOTE — Patient Outreach (Signed)
[facility]) Care Management   03/05/2017  Meredith Phillips TDVVOH [birth year] [id number]  Meredith Phillips is an 71 y.o. female  Subjective:  HTN: Pt reports she continue to take her BP and reports her readings with no symptoms related. Pt continues to recite what she would do if symptoms are presented.  MEDICATION: Pt requested review of her medications today. Pt also presented several pill boxes she use for her weekly medications. Pt feels a little un-organized and requested some assistance.  Objective:   Review of Systems  All other systems reviewed and are negative.   Physical Exam  Constitutional: She is oriented to person, place, and time. She appears well-developed and well-nourished.  HENT:  Right Ear: External ear normal.  Left Ear: External ear normal.  Eyes: EOM are normal.  Neck: Normal range of motion. Neck supple.  Cardiovascular: Normal heart sounds.   Respiratory: Effort normal and breath sounds normal.  GI: Soft. Bowel sounds are normal.  Musculoskeletal: Normal range of motion.  Neurological: She is alert and oriented to person, place, and time.  Skin: Skin is warm and dry.  Psychiatric: She has a normal mood and affect. Her behavior is normal. Judgment and thought content normal.    Encounter Medications:   Outpatient Encounter Prescriptions as of 03/05/2017  Medication Sig  . amLODipine (NORVASC) 10 MG tablet TAKE 1 TABLET (10 MG TOTAL) BY MOUTH DAILY.  . blood glucose meter kit and supplies KIT Dispense based on patient and insurance preference. Use up to four times daily as directed. (FOR ICD-9 250.00, 250.01).  . blood glucose meter kit and supplies KIT Dispense based on patient and insurance preference. Use up to four times daily as directed. E11.9 .  . carvedilol (COREG) 3.125 MG tablet TAKE 1 TABLET (3.125 MG TOTAL) BY MOUTH 2 (TWO) TIMES DAILY WITH A MEAL.  [name] Cholecalciferol (VITAMIN D-3) 1000 UNITS CAPS Take 1,000 Units by mouth.  .  clobetasol cream (TEMOVATE) 6.94 % Apply 1 application topically 2 (two) times daily. Do not use for more than two weeks at one time  . cloNIDine (CATAPRES) 0.1 MG tablet TAKE 1 TABLET (0.1 MG TOTAL) BY MOUTH 2 (TWO) TIMES DAILY.  [name] esomeprazole (NEXIUM) 40 MG capsule TAKE 1 CAPSULE (40 MG TOTAL) BY MOUTH DAILY AT 12 NOON.  [name] gabapentin (NEURONTIN) 300 MG capsule TAKE 1 CAPSULE (300 MG TOTAL) BY MOUTH 3 (THREE) TIMES DAILY.  [name] glucose blood (ONE TOUCH ULTRA TEST) test strip TEST UP TO 4 TIMES DAILY AS DIRECTED  . hydrochlorothiazide (HYDRODIURIL) 12.5 MG tablet Take 1 tablet (12.5 mg total) by mouth daily.  [name] losartan (COZAAR) 100 MG tablet TAKE 1 TABLET BY MOUTH EVERY DAY  . metFORMIN (GLUCOPHAGE-XR) 500 MG 24 hr tablet Take 1 tablet (500 mg total) by mouth daily with breakfast. Take 1 tablet by mouth 2 times daily with meals.  [name] DELICA LANCETS FINE MISC TEST UP TO 4 TIMES DAILY AS DIRECTED  . sertraline (ZOLOFT) 50 MG tablet TAKE 1 TABLET (50 MG TOTAL) BY MOUTH DAILY.  . simvastatin (ZOCOR) 20 MG tablet TAKE 1 TABLET BY MOUTH EVERY DAY  . traMADol (ULTRAM) 50 MG tablet Take 1 tablet (50 mg total) by mouth every 8 (eight) hours as needed.  . triamcinolone (NASACORT AQ) 55 MCG/ACT AERO nasal inhaler Place 2 sprays into the nose daily.  . cloNIDine (CATAPRES) 0.1 MG tablet Take 1 tablet (0.1 mg total) by mouth 2 (two) times daily. (Patient not taking: Reported  on 03/05/2017)  . dicyclomine (BENTYL) 10 MG capsule Take 10 mg by mouth as needed for spasms.  [name] gabapentin (NEURONTIN) 300 MG capsule TAKE 1 CAPSULE BY MOUTH THREE TIMES A DAY (Patient not taking: Reported on 03/05/2017)  . metFORMIN (GLUMETZA) 500 MG (MOD) 24 hr tablet Take 1 tablet (500 mg total) by mouth 2 (two) times daily. (Patient not taking: Reported on 03/05/2017)  . predniSONE (STERAPRED UNI-PAK 21 TAB) 10 MG (21) TBPK tablet Take 6 tablets x 1 day, 5 tablets x 1 day, 4 tablets x 1 day, 3 tablets x 1 day, 2 tablets x 1 day, 1  tablet x 1 day (Patient not taking: Reported on 03/05/2017)   No facility-administered encounter medications on file as of 03/05/2017.     Functional Status:   In your present state of health, do you have any difficulty performing the following activities: 12/30/2016  Hearing? N  Vision? N  Difficulty concentrating or making decisions? N  Walking or climbing stairs? N  Dressing or bathing? N  Doing errands, shopping? Y  Preparing Food and eating ? N  Using the Toilet? N  In the past six months, have you accidently leaked urine? N  Do you have problems with loss of bowel control? N  Managing your Medications? N  Managing your Finances? N  Housekeeping or managing your Housekeeping? N  Some recent data might be hidden    Fall/Depression Screening:    Fall Risk  12/30/2016 02/12/2016 12/26/2014  Falls in the past year? No No No   PHQ 2/9 Scores 12/30/2016 02/12/2016 12/26/2014  PHQ - 2 Score 0 0 0   BP 138/78 (BP Location: Left Arm, Patient Position: Sitting, Cuff Size: Normal)   Pulse 63   Resp 20   Ht 1.6 m ('5\' 3"' )   Wt 154 lb (69.9 kg)   SpO2 98%   BMI 27.28 kg/m  Assessment:   Ongoing case management related to HTN Adherence related to medications  Plan:  Will verify pt continues to manage her HTN with reported issues. Will verify pt continues to monitor her BP on her home device with no additional issues reported. Will reiterate on the possible signs and symptoms that maybe encountered and verify what pt should do if acute symptoms occur.  Will review all medications and educate on the purpose of each medication. Will verify pt has enough refills accordingly and labeled each medication for a reminder to herself. Will strongly encouraged pt to inquire with spouse if she has issues with reading the labelings of the medications. Will also provide a new pill box that will condense her into one pill box to improving and managing her medication adherence. Will fill up the new pill box  for the upcoming week for pt's understanding.  Will review the plan of care and goals with pt's ongoing participation. Will schedule the next home visit and continue to provider needed community resources as needed.  Raina Mina, RN Care Management Coordinator [place] Office [phone]

## 2017-03-13 ENCOUNTER — Other Ambulatory Visit (INDEPENDENT_AMBULATORY_CARE_PROVIDER_SITE_OTHER): Payer: PPO

## 2017-03-13 ENCOUNTER — Encounter: Payer: Self-pay | Admitting: Family

## 2017-03-13 ENCOUNTER — Ambulatory Visit (INDEPENDENT_AMBULATORY_CARE_PROVIDER_SITE_OTHER): Payer: PPO | Admitting: Family

## 2017-03-13 VITALS — BP 138/78 | HR 79 | Temp 98.2°F | Resp 16 | Ht 63.0 in | Wt 155.0 lb

## 2017-03-13 DIAGNOSIS — M545 Low back pain: Secondary | ICD-10-CM

## 2017-03-13 DIAGNOSIS — R42 Dizziness and giddiness: Secondary | ICD-10-CM | POA: Diagnosis not present

## 2017-03-13 DIAGNOSIS — G8929 Other chronic pain: Secondary | ICD-10-CM | POA: Diagnosis not present

## 2017-03-13 LAB — COMPREHENSIVE METABOLIC PANEL
ALT: 8 U/L (ref 0–35)
AST: 12 U/L (ref 0–37)
Albumin: 4.3 g/dL (ref 3.5–5.2)
Alkaline Phosphatase: 48 U/L (ref 39–117)
BILIRUBIN TOTAL: 0.6 mg/dL (ref 0.2–1.2)
BUN: 8 mg/dL (ref 6–23)
CALCIUM: 11.1 mg/dL — AB (ref 8.4–10.5)
CO2: 29 meq/L (ref 19–32)
CREATININE: 0.8 mg/dL (ref 0.40–1.20)
Chloride: 92 mEq/L — ABNORMAL LOW (ref 96–112)
GFR: 90.24 mL/min (ref >60.00)
GLUCOSE: 108 mg/dL — AB (ref 70–99)
Potassium: 3.9 mEq/L (ref 3.5–5.1)
Sodium: 127 mEq/L — ABNORMAL LOW (ref 135–145)
Total Protein: 7.5 g/dL (ref 6.0–8.3)

## 2017-03-13 LAB — CBC
HEMATOCRIT: 37.2 % (ref 36.0–46.0)
HEMOGLOBIN: 12.2 g/dL (ref 12.0–15.0)
MCHC: 32.9 g/dL (ref 30.0–36.0)
MCV: 87.9 fl (ref 78.0–100.0)
Platelets: 382 10*3/uL (ref 150.0–400.0)
RBC: 4.23 Mil/uL (ref 3.87–5.11)
RDW: 13.7 % (ref 11.5–15.5)
WBC: 4.7 10*3/uL (ref 4.0–10.5)

## 2017-03-13 NOTE — Assessment & Plan Note (Signed)
Right-sided low back pain without sciatica present on exam today. Treat conservatively with ice/moist heat and home exercise therapy. Consider additional imaging if symptoms worsen or do not improve. Recommend continued follow-up with orthopedics from previous referral.

## 2017-03-13 NOTE — Progress Notes (Signed)
Subjective:    Patient ID: Meredith Phillips, female    DOB: 1946/05/23, 71 y.o.   MRN: [id number]  Chief Complaint  Patient presents with  . Hip Pain    right side/hip pain, dizziness    HPI:  Meredith Phillips is a 71 y.o. female who  has a past medical history of Arthritis; Benign essential HTN (02/18/2015); Cellulitis (02/18/2015); Chicken pox; Diabetes mellitus without complication ([place]); Diabetic peripheral neuropathy ([place]); GERD (gastroesophageal reflux disease); Hypercholesteremia; Hypertension; Liver disease; and Polyp of colon. and presents today for a follow up office visit.  1.) Hip/right side pain - Continues to experience the associated symptom of pain located in her right hip and side pain that has been going on for several months and has been refractory to conservative treatment of home exercise therapy. Previously seen by Dr. Delfino Lovett for her hip and back.   2.) Dizziness - Associated symptom of dizziness has been going on for about 3 weeks. Describes the dizziness as lightheadedness. Frequency of episodes is constant throughout the day and effects her moving around. Aggravated with increased levels of movement. No congestion, shortness of breath, tinnitus or chest pain.    Allergies  Allergen Reactions  . Codeine Other (See Comments)    Woke up the next morning and did not know where she was  . Other     Doesn't like to take pain medication due to hallucinations.  [name] Shellfish Allergy       Outpatient Medications Prior to Visit  Medication Sig Dispense Refill  . amLODipine (NORVASC) 10 MG tablet TAKE 1 TABLET (10 MG TOTAL) BY MOUTH DAILY. 90 tablet 1  . blood glucose meter kit and supplies KIT Dispense based on patient and insurance preference. Use up to four times daily as directed. E11.9 . 1 each 0  . carvedilol (COREG) 3.125 MG tablet TAKE 1 TABLET (3.125 MG TOTAL) BY MOUTH 2 (TWO) TIMES DAILY WITH A MEAL. 60 tablet 5  . Cholecalciferol (VITAMIN D-3) 1000 UNITS CAPS  Take 1,000 Units by mouth.    . clobetasol cream (TEMOVATE) 2.09 % Apply 1 application topically 2 (two) times daily. Do not use for more than two weeks at one time 30 g 2  . dicyclomine (BENTYL) 10 MG capsule Take 10 mg by mouth as needed for spasms.    [name] esomeprazole (NEXIUM) 40 MG capsule TAKE 1 CAPSULE (40 MG TOTAL) BY MOUTH DAILY AT 12 NOON. 90 capsule 0  . gabapentin (NEURONTIN) 300 MG capsule TAKE 1 CAPSULE BY MOUTH THREE TIMES A DAY 90 capsule 2  . glucose blood (ONE TOUCH ULTRA TEST) test strip TEST UP TO 4 TIMES DAILY AS DIRECTED 100 each 3  . hydrochlorothiazide (HYDRODIURIL) 12.5 MG tablet Take 1 tablet (12.5 mg total) by mouth daily. 30 tablet 1  . losartan (COZAAR) 100 MG tablet TAKE 1 TABLET BY MOUTH EVERY DAY 90 tablet 1  . metFORMIN (GLUCOPHAGE-XR) 500 MG 24 hr tablet Take 1 tablet (500 mg total) by mouth daily with breakfast. Take 1 tablet by mouth 2 times daily with meals. 60 tablet 1  . ONETOUCH DELICA LANCETS FINE MISC TEST UP TO 4 TIMES DAILY AS DIRECTED 100 each 0  . sertraline (ZOLOFT) 50 MG tablet TAKE 1 TABLET (50 MG TOTAL) BY MOUTH DAILY. 30 tablet 3  . simvastatin (ZOCOR) 20 MG tablet TAKE 1 TABLET BY MOUTH EVERY DAY 90 tablet 1  . traMADol (ULTRAM) 50 MG tablet Take 1 tablet (50 mg total) by  mouth every 8 (eight) hours as needed. 60 tablet 0  . triamcinolone (NASACORT AQ) 55 MCG/ACT AERO nasal inhaler Place 2 sprays into the nose daily. 1 Inhaler 12  . blood glucose meter kit and supplies KIT Dispense based on patient and insurance preference. Use up to four times daily as directed. (FOR ICD-9 250.00, 250.01). 1 each 0  . cloNIDine (CATAPRES) 0.1 MG tablet Take 1 tablet (0.1 mg total) by mouth 2 (two) times daily. 180 tablet 1  . cloNIDine (CATAPRES) 0.1 MG tablet TAKE 1 TABLET (0.1 MG TOTAL) BY MOUTH 2 (TWO) TIMES DAILY. 60 tablet 5  . gabapentin (NEURONTIN) 300 MG capsule TAKE 1 CAPSULE (300 MG TOTAL) BY MOUTH 3 (THREE) TIMES DAILY. 90 capsule 3  . metFORMIN  (GLUMETZA) 500 MG (MOD) 24 hr tablet Take 1 tablet (500 mg total) by mouth 2 (two) times daily. 180 tablet 0  . predniSONE (STERAPRED UNI-PAK 21 TAB) 10 MG (21) TBPK tablet Take 6 tablets x 1 day, 5 tablets x 1 day, 4 tablets x 1 day, 3 tablets x 1 day, 2 tablets x 1 day, 1 tablet x 1 day 21 tablet 0   No facility-administered medications prior to visit.       Past Surgical History:  Procedure Laterality Date  . APPENDECTOMY    . HERNIA REPAIR    . KNEE SURGERY    . ROTATOR CUFF REPAIR    . TONSILLECTOMY        Past Medical History:  Diagnosis Date  . Arthritis   . Benign essential HTN 02/18/2015  . Cellulitis 02/18/2015  . Chicken pox   . Diabetes mellitus without complication ([place])   . Diabetic peripheral neuropathy ([place])   . GERD (gastroesophageal reflux disease)   . Hypercholesteremia   . Hypertension   . Liver disease   . Polyp of colon       Review of Systems  Constitutional: Negative for chills, fatigue, fever and unexpected weight change.  Eyes:       Negative for changes in vision  Respiratory: Negative for cough, chest tightness, shortness of breath and wheezing.   Cardiovascular: Negative for chest pain, palpitations and leg swelling.  Musculoskeletal:       Positive for right back and hip pain.  Neurological: Positive for dizziness and light-headedness. Negative for weakness.      Objective:    BP 138/78 (BP Location: Left Arm, Patient Position: Sitting, Cuff Size: Normal)   Pulse 79   Temp 98.2 F (36.8 C) (Oral)   Resp 16   Ht _0  (1.6 m)   Wt 155 lb (70.3 kg)   SpO2 96%   BMI 27.46 kg/m  Nursing note and vital signs reviewed.  Physical Exam  Constitutional: She is oriented to person, place, and time. She appears well-developed and well-nourished. No distress.  Cardiovascular: Normal rate, regular rhythm, normal heart sounds and intact distal pulses.  Exam reveals no gallop and no friction rub.   No murmur heard. Pulmonary/Chest: Effort  normal and breath sounds normal. No respiratory distress. She has no wheezes. She has no rales. She exhibits no tenderness.  Musculoskeletal:  Right hip/back - no obvious deformity, discoloration, or edema. Palpable tenderness of the lumbar paraspinal musculature located on the right side radiating down into the anterior hip. Range of motion within normal limits. Pulses and sensation are intact and appropriate. Negative straight leg raise and negative Faber's.  Neurological: She is alert and oriented to person, place, and time.  Skin: Skin is warm and dry.  Psychiatric: She has a normal mood and affect. Her behavior is normal. Judgment and thought content normal.       Assessment & Plan:   Problem List Items Addressed This Visit      Other   Lightheadedness    Lightheadedness with questionable origin of blood pressure medications, elevated blood pressure, or possible dehydration. Obtain complete metabolic profile and CBC.  Hold clonidine. Continue other blood pressure medications as prescribed and follow low-sodium diet. Encouraged to drink plenty of non-caffeinated/nonalcoholic fluid. Change position slowly. Follow-up if symptoms worsen or do not improve.      Relevant Orders   CBC (Completed)   Comprehensive metabolic panel (Completed)   Right-sided low back pain without sciatica - Primary    Right-sided low back pain without sciatica present on exam today. Treat conservatively with ice/moist heat and home exercise therapy. Consider additional imaging if symptoms worsen or do not improve. Recommend continued follow-up with orthopedics from previous referral.      Relevant Orders   Ambulatory referral to Physical Therapy       I have discontinued Meredith Phillips's cloNIDine, cloNIDine, and predniSONE. I am also having her maintain her dicyclomine, Vitamin D-3, clobetasol cream, traMADol, triamcinolone, simvastatin, carvedilol, sertraline, blood glucose meter kit and supplies, esomeprazole,  losartan, gabapentin, hydrochlorothiazide, amLODipine, ONETOUCH DELICA LANCETS FINE, metFORMIN, and glucose blood.    Follow-up: Return in about 1 month (around 04/12/2017), or if symptoms worsen or fail to improve.  Mauricio Po, FNP

## 2017-03-13 NOTE — Patient Instructions (Addendum)
Thank you for choosing [facility].  SUMMARY AND INSTRUCTIONS:  Please stop taking the Clonidine.   Continue to take your other medications as prescribed.  Follow up with Dr. Jovita Gamma office for your back and hip:  [phone]  Continue to monitor your blood pressure at home.   If a referral is needed please let us know.  An order for physical therapy has been placed.   Medication:  Your prescription(s) have been submitted to your pharmacy or been printed and provided for you. Please take as directed and contact our office if you believe you are having problem(s) with the medication(s) or have any questions.  Labs:  Please stop by the lab on the lower level of the building for your blood work. Your results will be released to MyChart (or called to you) after review, usually within 72 hours after test completion. If any changes need to be made, you will be notified at that same time.  1.) The lab is open from 7:30am to 5:30 pm Monday-Friday 2.) No appointment is necessary 3.) Fasting (if needed) is 6-8 hours after food and drink; black coffee and water are okay   Follow up:  If your symptoms worsen or fail to improve, please contact our office for further instruction, or in case of emergency go directly to the emergency room at the closest medical facility.

## 2017-03-13 NOTE — Assessment & Plan Note (Signed)
Lightheadedness with questionable origin of blood pressure medications, elevated blood pressure, or possible dehydration. Obtain complete metabolic profile and CBC.  Hold clonidine. Continue other blood pressure medications as prescribed and follow low-sodium diet. Encouraged to drink plenty of non-caffeinated/nonalcoholic fluid. Change position slowly. Follow-up if symptoms worsen or do not improve.

## 2017-03-16 ENCOUNTER — Other Ambulatory Visit: Payer: Self-pay

## 2017-03-16 MED ORDER — HYDROCHLOROTHIAZIDE 12.5 MG PO TABS: 12.5000 mg | ORAL_TABLET | Freq: Every day | ORAL | 0 refills | Status: DC

## 2017-03-19 ENCOUNTER — Other Ambulatory Visit: Payer: Self-pay

## 2017-03-19 DIAGNOSIS — I10 Essential (primary) hypertension: Secondary | ICD-10-CM

## 2017-03-19 DIAGNOSIS — F4321 Adjustment disorder with depressed mood: Secondary | ICD-10-CM

## 2017-03-19 DIAGNOSIS — Z634 Disappearance and death of family member: Secondary | ICD-10-CM

## 2017-03-19 MED ORDER — SERTRALINE HCL 50 MG PO TABS: 50.0000 mg | ORAL_TABLET | Freq: Every day | ORAL | 0 refills | Status: DC

## 2017-03-25 ENCOUNTER — Ambulatory Visit: Payer: PPO | Admitting: Physical Therapy

## 2017-03-30 ENCOUNTER — Ambulatory Visit: Payer: PPO | Attending: Family | Admitting: Physical Therapy

## 2017-03-30 DIAGNOSIS — R293 Abnormal posture: Secondary | ICD-10-CM | POA: Diagnosis not present

## 2017-03-30 DIAGNOSIS — R29898 Other symptoms and signs involving the musculoskeletal system: Secondary | ICD-10-CM | POA: Insufficient documentation

## 2017-03-30 DIAGNOSIS — Z7409 Other reduced mobility: Secondary | ICD-10-CM | POA: Insufficient documentation

## 2017-03-30 DIAGNOSIS — M545 Low back pain: Secondary | ICD-10-CM | POA: Diagnosis not present

## 2017-03-30 DIAGNOSIS — M79604 Pain in right leg: Secondary | ICD-10-CM | POA: Diagnosis not present

## 2017-03-30 DIAGNOSIS — M25551 Pain in right hip: Secondary | ICD-10-CM | POA: Diagnosis not present

## 2017-03-30 DIAGNOSIS — R531 Weakness: Secondary | ICD-10-CM | POA: Insufficient documentation

## 2017-03-30 DIAGNOSIS — G8929 Other chronic pain: Secondary | ICD-10-CM | POA: Diagnosis not present

## 2017-03-30 DIAGNOSIS — M623 Immobility syndrome (paraplegic): Secondary | ICD-10-CM | POA: Diagnosis not present

## 2017-03-30 NOTE — Therapy (Signed)
[facility] Outpatient Rehabilitation [facility] [address]  [address] [place], [place], [address] Phone: [phone]   Fax:  [phone]  Physical Therapy Evaluation  Patient Details  Name: Meredith Phillips MRN: [id number] Date of Birth: 1945/09/23 Referring Provider: Jeanine Luz, FNP  Encounter Date: 03/30/2017      PT End of Session - 03/30/17 1050    Visit Number 1   Number of Visits 12   Date for PT Re-Evaluation 05/11/17   Authorization Type [employer]   PT Start Time 1014   PT Stop Time 1100   PT Time Calculation (min) 46 min   Activity Tolerance Patient tolerated treatment well   Behavior During Therapy [facility] for tasks assessed/performed      Past Medical History:  Diagnosis Date  . Arthritis   . Benign essential HTN 02/18/2015  . Cellulitis 02/18/2015  . Chicken pox   . Diabetes mellitus without complication (HCC)   . Diabetic peripheral neuropathy (HCC)   . GERD (gastroesophageal reflux disease)   . Hypercholesteremia   . Hypertension   . Liver disease   . Polyp of colon     Past Surgical History:  Procedure Laterality Date  . APPENDECTOMY    . HERNIA REPAIR    . KNEE SURGERY    . ROTATOR CUFF REPAIR    . TONSILLECTOMY      There were no vitals filed for this visit.       Subjective Assessment - 03/30/17 1014    Subjective Patietn reporting stomach issuses starting in 2016 - diverticulosis; in 2017 L knee began to hurt; no reporting R buttock and R sided low back. No known mechanism of injury. Nothing seems to help with pain; prolonged sitting increases pain - must weight shift in sitting. Intermittent pain down to approx level of knee. Cannot sleep on R side   Diagnostic tests none    Patient Stated Goals improve pain   Currently in Pain? Yes   Pain Score 8    Pain Location Back  and buttock   Pain Orientation Right   Pain Descriptors / Indicators Sore;Aching  intermittent sharp pain   Pain Type Chronic pain   Pain Onset  More than a month ago   Pain Frequency Constant   Aggravating Factors  prolonged sitting, lying on R side   Pain Relieving Factors nothing to note            [facility] PT Assessment - 03/30/17 1008      Assessment   Medical Diagnosis Chronic right-sided LBP without sciatica   Referring Provider Jeanine Luz, FNP   Onset Date/Surgical Date --  ~2017   Next MD Visit prn   Prior Therapy no     Precautions   Precautions None     Restrictions   Weight Bearing Restrictions No     Balance Screen   Has the patient fallen in the past 6 months No   Has the patient had a decrease in activity level because of a fear of falling?  No   Is the patient reluctant to leave their home because of a fear of falling?  No     Home Nurse, mental health Private residence   Living Arrangements Spouse/significant other;Children   Type of Home House   Home Access Stairs to enter   Entrance Stairs-Number of Steps 2   Home Layout One level     Prior Function   Level of Independence Independent  Vocation Retired   Leisure puzzles, reading     Cognition   Overall Cognitive Status Within Functional Limits for tasks assessed     Observation/Other Assessments   Focus on Therapeutic Outcomes (FOTO)  Lumbar Spine: 37 (63% limited, predicted 46% limited)     Sensation   Light Touch Appears Intact     Coordination   Gross Motor Movements are Fluid and Coordinated Yes     Posture/Postural Control   Posture/Postural Control Postural limitations   Postural Limitations Rounded Shoulders;Forward head     ROM / Strength   AROM / PROM / Strength AROM;Strength     AROM   AROM Assessment Site Lumbar   Lumbar Flexion fingertip to toes - 8/10 pain   Lumbar Extension 50% limited - painful   Lumbar - Right Side Bend WFL - painful   Lumbar - Left Side Bend WFL - painful   Lumbar - Right Rotation 25% limited   Lumbar - Left Rotation 25% limited     Strength   Strength Assessment Site  Hip;Knee   Right/Left Hip Right;Left   Right Hip Flexion 3+/5   Right Hip ABduction 3+/5   Left Hip Flexion 4-/5   Right/Left Knee Right;Left   Right Knee Flexion 4/5   Right Knee Extension 4/5   Left Knee Flexion 4/5   Left Knee Extension 4/5     Flexibility   Soft Tissue Assessment /Muscle Length yes   Hamstrings B tightness vs guarding     Palpation   Palpation comment TTP along R glute/piriformis, greater trochanter            Objective measurements completed on examination: See above findings.          [facility] Adult PT Treatment/Exercise - 03/30/17 1008      Exercises   Exercises Lumbar     Lumbar Exercises: Stretches   Passive Hamstring Stretch 3 reps;30 seconds   Passive Hamstring Stretch Limitations R - supine with strsp   Piriformis Stretch 3 reps;30 seconds   Piriformis Stretch Limitations R - KTOS     Lumbar Exercises: Supine   Bridge 10 reps   Straight Leg Raise 10 reps   Straight Leg Raises Limitations R LE only                PT Education - 03/30/17 1049    Education provided Yes   Education Details exam findings, POC, HEP   Person(s) Educated Patient   Methods Explanation;Demonstration;Handout   Comprehension Verbalized understanding;Returned demonstration          PT Short Term Goals - 03/30/17 1053      PT SHORT TERM GOAL #1   Title patient to be independent with initial HEP   Status New   Target Date 04/20/17           PT Long Term Goals - 03/30/17 1053      PT LONG TERM GOAL #1   Title patient to be independwnt with advanced HEP   Status New   Target Date 05/11/17     PT LONG TERM GOAL #2   Title Patient to demosntrate lumbar AROM to [facility] and symmetrical without pain provocation for improved functional mobility   Status New   Target Date 05/11/17     PT LONG TERM GOAL #3   Title Patient to report pain decrease at R back/buttock by 50-75%   Status New   Target Date 05/11/17     PT LONG TERM GOAL #4  Title Patient to improve B LE strength to >/= 4+/5   Status New   Target Date 05/11/17     PT LONG TERM GOAL #5   Title Patient to report ability to sit for polonged times as well as perform ADLs and household tasks wihtout pain limiting   Status New   Target Date 05/11/17                Plan - 01-Apr-2017 1051    Clinical Impression Statement Patient is a 71 y/o female presenting to OPPT today regarding primary reports of R buttock pain with intermittent pain radiation into R posterior thigh. Patient with signs and symptoms consistent with piriformis syndrome vs greater trochanteric bursitis. Patient today with proximal hip weakness, slight AROM restrictions, TTP along R greater trochanter and within R glute and piriformis. Patient given initial HEP for gentle stretching and strengthening with good carryover. patient to benefit from PT to address pain and functional mobility limitations to allow for improved QOL.    Clinical Presentation Stable   Clinical Decision Making Low   Rehab Potential Good   PT Frequency 2x / week   PT Duration 6 weeks   PT Treatment/Interventions ADLs/Self Care Home Management;Cryotherapy;Electrical Stimulation;Iontophoresis 4mg /ml Dexamethasone;Moist Heat;Ultrasound;Traction;Neuromuscular re-education;Therapeutic exercise;Therapeutic activities;Functional mobility training;Patient/family education;Stair training;Manual techniques;Passive range of motion;Vasopneumatic Device;Taping;Dry needling   Consulted and Agree with Plan of Care Patient      Patient will benefit from skilled therapeutic intervention in order to improve the following deficits and impairments:  Decreased activity tolerance, Decreased mobility, Difficulty walking, Decreased strength, Pain  Visit Diagnosis: Chronic right-sided low back pain, with sciatica presence unspecified - Plan: PT plan of care cert/re-cert  Pain in right hip - Plan: PT plan of care cert/re-cert  Other symptoms and  signs involving the musculoskeletal system - Plan: PT plan of care cert/re-cert      G-Codes - 2017-04-01 1122    Functional Assessment Tool Used (Outpatient Only) FOTO: 37 (63% limited)   Functional Limitation Mobility: Walking and moving around   Mobility: Walking and Moving Around Current Status 04/01/17) At least 60 percent but less than 80 percent impaired, limited or restricted   Mobility: Walking and Moving Around Goal Status [phone]) At least 40 percent but less than 60 percent impaired, limited or restricted       Problem List Patient Active Problem List   Diagnosis Date Noted  . Right-sided low back pain without sciatica 02/10/2017  . Dark stools 12/23/2016  . Arthralgia 12/23/2016  . Grief at loss of child 07/29/2016  . Acute upper respiratory infection 02/27/2016  . Allergic rhinitis 02/27/2016  . Concussion 02/05/2016  . Breast pain, right 02/05/2016  . Rash and nonspecific skin eruption 11/13/2015  . Muscle cramping 10/12/2015  . Lightheadedness 08/21/2015  . GERD (gastroesophageal reflux disease) 08/06/2015  . Baker's cyst, ruptured 08/03/2015  . Hip pain 06/29/2015  . Type 2 diabetes mellitus (HCC) 06/29/2015  . Arthritis of left knee 06/15/2015  . Left knee pain 06/09/2015  . Pain of left side of body 02/22/2015  . Benign essential HTN 02/18/2015  . Cellulitis 02/18/2015  . Sciatica 01/17/2015  . Routine general medical examination at a health care facility 12/26/2014  . Medicare annual wellness visit, subsequent 12/26/2014     02/26/2015, PT, DPT 04/01/17 11:31 AM   [facility] [address]  [address] [place], [place], [place] Phone: [phone]   Fax:  [phone]  Name: Khiana  NILLIE BARTOLOTTA MRN: [id number] Date of Birth: 10-Apr-1946

## 2017-03-30 NOTE — Patient Instructions (Signed)
Hamstring Step 2   Left foot relaxed, knee straight, other leg bent, foot flat. Raise straight leg further upward to maximal range. Hold __30_ seconds. Relax leg completely down. Repeat _3__ times.  Strengthening: Straight Leg Raise (Phase 1)   Tighten muscles on front of right thigh, then lift leg __6-8__ inches from surface, keeping knee locked.  Repeat _10-15___ times per set. Do __2__ sets per session.   Bridge   Lie back, legs bent. Inhale, pressing hips up. Keeping ribs in, lengthen lower back. Exhale, rolling down along spine from top. Repeat __10-15__ times. Do __2__ sessions per day.  Piriformis Stretch   Lying on back, pull right knee toward opposite shoulder. Hold _30__ seconds. Repeat __3__ times.

## 2017-04-02 ENCOUNTER — Other Ambulatory Visit: Payer: Self-pay | Admitting: *Deleted

## 2017-04-02 NOTE — Patient Outreach (Signed)
[facility]) Care Management   04/02/2017  Meredith Phillips [id number] 1945/06/28 [id number]  Meredith Phillips is an 71 y.o. female  Subjective:  HTN:  Pt reports her BP has been good both on her home device and on her providers visits. Pt states she will be seeing her new provided over the next month but stress she does not wish to loss this RN case Freight forwarder. Pt denies any symptoms of HTN and able to resite come of the symptoms and what to do if acute. Pt states she has been weaned off two of her BP medication HCTZ and Coreg. States her last BP was 148/80 with no encountered problems.  MEDICATIONS: Pt reports she continue to enjoy refilling her pill box with no additional issues with mistaking her medications. Pt express how happy she is with using her new pill box and continues to filling it weekly with the instructions and demonstration preformed by this RN case manager on the last home visit. No problems or issues reported however several refills needed pt does not have filled and has not contacted her pharmacy to request her ongoing medications.    Objective:   Review of Systems  Constitutional: Negative.   HENT: Negative.   Eyes: Negative.   Respiratory: Negative.   Cardiovascular: Negative.   Gastrointestinal: Negative.   Genitourinary: Negative.   Musculoskeletal: Negative.   Skin: Negative.   Neurological: Negative.   Endo/Heme/Allergies: Negative.   Psychiatric/Behavioral: Negative.     Physical Exam  Constitutional: She is oriented to person, place, and time. She appears well-developed and well-nourished.  HENT:  Right Ear: External ear normal.  Left Ear: External ear normal.  Eyes: EOM are normal.  Neck: Normal range of motion.  Cardiovascular: Normal heart sounds.   Respiratory: Breath sounds normal.  GI: Soft. Bowel sounds are normal.  Musculoskeletal: Normal range of motion.  Neurological: She is alert and oriented to person, place, and time.  Skin: Skin is  warm and dry.  Psychiatric: She has a normal mood and affect. Her behavior is normal. Judgment and thought content normal.    Encounter Medications:   Outpatient Encounter Prescriptions as of 04/02/2017  Medication Sig  . amLODipine (NORVASC) 10 MG tablet TAKE 1 TABLET (10 MG TOTAL) BY MOUTH DAILY.  . blood glucose meter kit and supplies KIT Dispense based on patient and insurance preference. Use up to four times daily as directed. E11.9 .  [name] Cholecalciferol (VITAMIN D-3) 1000 UNITS CAPS Take 1,000 Units by mouth.  . clobetasol cream (TEMOVATE) 2.13 % Apply 1 application topically 2 (two) times daily. Do not use for more than two weeks at one time  . dicyclomine (BENTYL) 10 MG capsule Take 10 mg by mouth as needed for spasms.  [name] esomeprazole (NEXIUM) 40 MG capsule TAKE 1 CAPSULE (40 MG TOTAL) BY MOUTH DAILY AT 12 NOON.  [name] gabapentin (NEURONTIN) 300 MG capsule TAKE 1 CAPSULE BY MOUTH THREE TIMES A DAY  . glucose blood (ONE TOUCH ULTRA TEST) test strip TEST UP TO 4 TIMES DAILY AS DIRECTED  . losartan (COZAAR) 100 MG tablet TAKE 1 TABLET BY MOUTH EVERY DAY  . metFORMIN (GLUCOPHAGE-XR) 500 MG 24 hr tablet Take 1 tablet (500 mg total) by mouth daily with breakfast. Take 1 tablet by mouth 2 times daily with meals.  [name] DELICA LANCETS FINE MISC TEST UP TO 4 TIMES DAILY AS DIRECTED  . sertraline (ZOLOFT) 50 MG tablet Take 1 tablet (50 mg total) by mouth daily.  [name]  simvastatin (ZOCOR) 20 MG tablet TAKE 1 TABLET BY MOUTH EVERY DAY  . traMADol (ULTRAM) 50 MG tablet Take 1 tablet (50 mg total) by mouth every 8 (eight) hours as needed.  . triamcinolone (NASACORT AQ) 55 MCG/ACT AERO nasal inhaler Place 2 sprays into the nose daily.  . carvedilol (COREG) 3.125 MG tablet TAKE 1 TABLET (3.125 MG TOTAL) BY MOUTH 2 (TWO) TIMES DAILY WITH A MEAL. (Patient not taking: Reported on 04/02/2017)  . hydrochlorothiazide (HYDRODIURIL) 12.5 MG tablet Take 1 tablet (12.5 mg total) by mouth daily. (Patient not taking:  Reported on 04/02/2017)   No facility-administered encounter medications on file as of 04/02/2017.     Functional Status:   In your present state of health, do you have any difficulty performing the following activities: 12/30/2016  Hearing? N  Vision? N  Difficulty concentrating or making decisions? N  Walking or climbing stairs? N  Dressing or bathing? N  Doing errands, shopping? Y  Preparing Food and eating ? N  Using the Toilet? N  In the past six months, have you accidently leaked urine? N  Do you have problems with loss of bowel control? N  Managing your Medications? N  Managing your Finances? N  Housekeeping or managing your Housekeeping? N  Some recent data might be hidden    Fall/Depression Screening:    Fall Risk  12/30/2016 02/12/2016 12/26/2014  Falls in the past year? No No No   PHQ 2/9 Scores 12/30/2016 02/12/2016 12/26/2014  PHQ - 2 Score 0 0 0   BP (!) 142/78 (BP Location: Left Arm, Patient Position: Sitting, Cuff Size: Normal)   Pulse 84   Resp 20   Ht 1.6 m ('5\' 3"' )   Wt 150 lb (68 kg)   SpO2 99%   BMI 26.57 kg/m  Assessment:   Ongoing case management related to HTN Medication adherence related to organization and refills  Plan:  Will verify pt remains asymptomatic related to her hypo-hypertension and continue to monitor her BP with her home device with no reported issues. Will verify no falls or headaches related to her BP and continue to encouraged adherence with her daily monitoring of her BP.  Will call in refills for needed medications Metformin, Simvastatin and will inform pt to pick up her ongoing TC medication Nasacort.  Will review the current plan of care and note success on the goals met. In addition to generate additional goals related to today's home visit and goals pt needs to focus on to prevent issues with her ongoing management of care such as refilling needed medications. Will rescheduled another follow up visit next month and schedule  accordingly. t is making great progress and following through with all recommendations from her providers and plan of care with [facility] case manager.  Raina Mina, RN Care Management Coordinator [place] Office [phone]

## 2017-04-07 ENCOUNTER — Ambulatory Visit: Payer: PPO | Admitting: Physical Therapy

## 2017-04-07 DIAGNOSIS — M25551 Pain in right hip: Secondary | ICD-10-CM

## 2017-04-07 DIAGNOSIS — G8929 Other chronic pain: Secondary | ICD-10-CM

## 2017-04-07 DIAGNOSIS — M545 Low back pain: Secondary | ICD-10-CM | POA: Diagnosis not present

## 2017-04-07 DIAGNOSIS — R29898 Other symptoms and signs involving the musculoskeletal system: Secondary | ICD-10-CM

## 2017-04-07 NOTE — Therapy (Signed)
[facility] Outpatient Rehabilitation [facility] [address]  [address] [place], [place], [address] Phone: [phone]   Fax:  [phone]  Physical Therapy Treatment  Patient Details  Name: Meredith Phillips MRN: [id number] Date of Birth: 1945-08-03 Referring Provider: Jeanine Luz, FNP  Encounter Date: 04/07/2017      PT End of Session - 04/07/17 0916    Visit Number 2   Number of Visits 12   Date for PT Re-Evaluation 05/11/17   Authorization Type [employer]   PT Start Time 0911   PT Stop Time 0956   PT Time Calculation (min) 45 min   Activity Tolerance Patient tolerated treatment well   Behavior During Therapy [facility] for tasks assessed/performed      Past Medical History:  Diagnosis Date  . Arthritis   . Benign essential HTN 02/18/2015  . Cellulitis 02/18/2015  . Chicken pox   . Diabetes mellitus without complication (HCC)   . Diabetic peripheral neuropathy (HCC)   . GERD (gastroesophageal reflux disease)   . Hypercholesteremia   . Hypertension   . Liver disease   . Polyp of colon     Past Surgical History:  Procedure Laterality Date  . APPENDECTOMY    . HERNIA REPAIR    . KNEE SURGERY    . ROTATOR CUFF REPAIR    . TONSILLECTOMY      There were no vitals filed for this visit.      Subjective Assessment - 04/07/17 0912    Subjective Patient noticing improvement in symptoms - had a high BP reading yesterday   Patient Stated Goals improve pain   Currently in Pain? Yes   Pain Score 7    Pain Location Buttocks   Pain Orientation Right   Pain Descriptors / Indicators Aching;Sore   Pain Type Chronic pain                         [facility] Adult PT Treatment/Exercise - 04/07/17 0917      Lumbar Exercises: Stretches   Passive Hamstring Stretch 3 reps;30 seconds   Passive Hamstring Stretch Limitations B: supine with strap   Piriformis Stretch 1 rep;60 seconds   Piriformis Stretch Limitations R - KTOS     Lumbar Exercises:  Aerobic   Stationary Bike NuStep: L4 x 6 min     Lumbar Exercises: Seated   Sit to Stand 10 reps   Sit to Stand Limitations standing on 1 AirEx     Lumbar Exercises: Supine   Clam 15 reps   Clam Limitations red tband: bilateral   Bridge 15 reps;5 seconds   Bridge Limitations red tband at knees   Straight Leg Raise 15 reps   Straight Leg Raises Limitations B; 2#     Lumbar Exercises: Sidelying   Clam 15 reps   Clam Limitations red tband - R only     Modalities   Modalities Iontophoresis     Iontophoresis   Type of Iontophoresis Dexamethasone   Location R GT   Dose 1.0 mL   Time 4-6 Hours; 62mA                  PT Short Term Goals - 04/07/17 0916      PT SHORT TERM GOAL #1   Title patient to be independent with initial HEP   Status On-going           PT Long Term Goals - 04/07/17 7342  PT LONG TERM GOAL #1   Title patient to be independwnt with advanced HEP   Status On-going     PT LONG TERM GOAL #2   Title Patient to demosntrate lumbar AROM to [facility] and symmetrical without pain provocation for improved functional mobility   Status On-going     PT LONG TERM GOAL #3   Title Patient to report pain decrease at R back/buttock by 50-75%   Status On-going     PT LONG TERM GOAL #4   Title Patient to improve B LE strength to >/= 4+/5   Status On-going     PT LONG TERM GOAL #5   Title Patient to report ability to sit for polonged times as well as perform ADLs and household tasks wihtout pain limiting   Status On-going               Plan - 04/07/17 0916    Clinical Impression Statement Patient reporting fair compliance with HEP, however, has began to notice an improvement in pain patterns. Good tolerance to all stretching and strengthening today with no increase in pain. Ionto initiated for pain control - will assess response and update HEP at next visit.    PT Treatment/Interventions ADLs/Self Care Home Management;Cryotherapy;Electrical  Stimulation;Iontophoresis 4mg /ml Dexamethasone;Moist Heat;Ultrasound;Traction;Neuromuscular re-education;Therapeutic exercise;Therapeutic activities;Functional mobility training;Patient/family education;Stair training;Manual techniques;Passive range of motion;Vasopneumatic Device;Taping;Dry needling   Consulted and Agree with Plan of Care Patient      Patient will benefit from skilled therapeutic intervention in order to improve the following deficits and impairments:  Decreased activity tolerance, Decreased mobility, Difficulty walking, Decreased strength, Pain  Visit Diagnosis: Chronic right-sided low back pain, with sciatica presence unspecified  Pain in right hip  Other symptoms and signs involving the musculoskeletal system     Problem List Patient Active Problem List   Diagnosis Date Noted  . Right-sided low back pain without sciatica 02/10/2017  . Dark stools 12/23/2016  . Arthralgia 12/23/2016  . Grief at loss of child 07/29/2016  . Acute upper respiratory infection 02/27/2016  . Allergic rhinitis 02/27/2016  . Concussion 02/05/2016  . Breast pain, right 02/05/2016  . Rash and nonspecific skin eruption 11/13/2015  . Muscle cramping 10/12/2015  . Lightheadedness 08/21/2015  . GERD (gastroesophageal reflux disease) 08/06/2015  . Baker's cyst, ruptured 08/03/2015  . Hip pain 06/29/2015  . Type 2 diabetes mellitus (HCC) 06/29/2015  . Arthritis of left knee 06/15/2015  . Left knee pain 06/09/2015  . Pain of left side of body 02/22/2015  . Benign essential HTN 02/18/2015  . Cellulitis 02/18/2015  . Sciatica 01/17/2015  . Routine general medical examination at a health care facility 12/26/2014  . Medicare annual wellness visit, subsequent 12/26/2014    02/26/2015, PT, DPT 04/07/17 10:01 AM   [facility] [address]  [address] [place], [place], [place] Phone: [phone]   Fax:  [phone]  Name:  Meredith Phillips MRN: Rebekah [place]field Date of Birth: [birth year]

## 2017-04-10 ENCOUNTER — Ambulatory Visit: Payer: PPO | Admitting: Physical Therapy

## 2017-04-10 DIAGNOSIS — Z7409 Other reduced mobility: Secondary | ICD-10-CM

## 2017-04-10 DIAGNOSIS — M79604 Pain in right leg: Secondary | ICD-10-CM

## 2017-04-10 DIAGNOSIS — G8929 Other chronic pain: Secondary | ICD-10-CM

## 2017-04-10 DIAGNOSIS — R6889 Other general symptoms and signs: Secondary | ICD-10-CM

## 2017-04-10 DIAGNOSIS — M25551 Pain in right hip: Secondary | ICD-10-CM

## 2017-04-10 DIAGNOSIS — R531 Weakness: Secondary | ICD-10-CM

## 2017-04-10 DIAGNOSIS — M545 Low back pain: Principal | ICD-10-CM

## 2017-04-10 DIAGNOSIS — R29898 Other symptoms and signs involving the musculoskeletal system: Secondary | ICD-10-CM

## 2017-04-10 DIAGNOSIS — M2560 Stiffness of unspecified joint, not elsewhere classified: Secondary | ICD-10-CM

## 2017-04-10 DIAGNOSIS — R293 Abnormal posture: Secondary | ICD-10-CM

## 2017-04-10 NOTE — Therapy (Signed)
[facility] Outpatient Rehabilitation [facility] [address]  [address] [place], [place], [address] Phone: [phone]   Fax:  [phone]  Physical Therapy Treatment  Patient Details  Name: Meredith Phillips MRN: [id number] Date of Birth: [birth year] Referring Provider: Jeanine Luz, FNP  Encounter Date: 04/10/2017      PT End of Session - 04/10/17 1024    Visit Number 3   Number of Visits 12   Date for PT Re-Evaluation 05/11/17   Authorization Type [employer]   PT Start Time 0935   PT Stop Time 1018   PT Time Calculation (min) 43 min   Activity Tolerance Patient tolerated treatment well   Behavior During Therapy [facility] for tasks assessed/performed      Past Medical History:  Diagnosis Date  . Arthritis   . Benign essential HTN 02/18/2015  . Cellulitis 02/18/2015  . Chicken pox   . Diabetes mellitus without complication (HCC)   . Diabetic peripheral neuropathy (HCC)   . GERD (gastroesophageal reflux disease)   . Hypercholesteremia   . Hypertension   . Liver disease   . Polyp of colon     Past Surgical History:  Procedure Laterality Date  . APPENDECTOMY    . HERNIA REPAIR    . KNEE SURGERY    . ROTATOR CUFF REPAIR    . TONSILLECTOMY      There were no vitals filed for this visit.      Subjective Assessment - 04/10/17 1107    Subjective Patient reporting improvement in pain today. Noted significant relief with ionto patch and good compliance with HEP.   Currently in Pain? Yes   Pain Score 2    Pain Location Hip   Pain Orientation Right   Pain Descriptors / Indicators Sore                         [facility] Adult PT Treatment/Exercise - 04/10/17 0945      Lumbar Exercises: Stretches   Piriformis Stretch 2 reps;30 seconds   Piriformis Stretch Limitations R - KTOS     Lumbar Exercises: Aerobic   Tread Mill 5 min; 1.4 speed     Lumbar Exercises: Seated   Sit to Stand 15 reps   Sit to Stand Limitations standing on 1 AirEx      Lumbar Exercises: Supine   Clam 15 reps   Clam Limitations red tband: bilateral   Bridge 15 reps;5 seconds   Bridge Limitations red tband at knees     Lumbar Exercises: Sidelying   Hip Abduction 10 reps;2 seconds   Hip Abduction Weights (lbs) 2#   Hip Abduction Limitations verbal cueing for poper positioning     Lumbar Exercises: Quadruped   Madcat/Old Horse 10 reps   Madcat/Old Horse Limitations verbal/tactile cueing for technique   Straight Leg Raise 10 reps;2 seconds   Straight Leg Raises Limitations Right; Left     Modalities   Modalities Iontophoresis     Iontophoresis   Type of Iontophoresis Dexamethasone   Location R GT   Dose 1.0 mL   Time 4-6 Hours; 60mA                  PT Short Term Goals - 04/07/17 4627      PT SHORT TERM GOAL #1   Title patient to be independent with initial HEP   Status On-going           PT Long Term  Goals - 04/07/17 0916      PT LONG TERM GOAL #1   Title patient to be independwnt with advanced HEP   Status On-going     PT LONG TERM GOAL #2   Title Patient to demosntrate lumbar AROM to [facility] and symmetrical without pain provocation for improved functional mobility   Status On-going     PT LONG TERM GOAL #3   Title Patient to report pain decrease at R back/buttock by 50-75%   Status On-going     PT LONG TERM GOAL #4   Title Patient to improve B LE strength to >/= 4+/5   Status On-going     PT LONG TERM GOAL #5   Title Patient to report ability to sit for polonged times as well as perform ADLs and household tasks wihtout pain limiting   Status On-going               Plan - 04/10/17 1110    Clinical Impression Statement Patient reporting improvement in pain with ionto patch and requested to continue with ionto. Good tolerance to strengthening exercises today with no increase in pain. Required heavy verbal and tactile cues for correct technique with new lumbar/LE exercises.    PT Treatment/Interventions  ADLs/Self Care Home Management;Cryotherapy;Electrical Stimulation;Iontophoresis 4mg /ml Dexamethasone;Moist Heat;Ultrasound;Traction;Neuromuscular re-education;Therapeutic exercise;Therapeutic activities;Functional mobility training;Patient/family education;Stair training;Manual techniques;Passive range of motion;Vasopneumatic Device;Taping;Dry needling   Consulted and Agree with Plan of Care Patient      Patient will benefit from skilled therapeutic intervention in order to improve the following deficits and impairments:  Decreased activity tolerance, Decreased mobility, Difficulty walking, Decreased strength, Pain  Visit Diagnosis: Chronic right-sided low back pain, with sciatica presence unspecified  Pain in right hip  Other symptoms and signs involving the musculoskeletal system  Pain of right lower extremity  Stiffness due to immobility  Abnormal posture  Decreased strength, endurance, and mobility     Problem List Patient Active Problem List   Diagnosis Date Noted  . Right-sided low back pain without sciatica 02/10/2017  . Dark stools 12/23/2016  . Arthralgia 12/23/2016  . Grief at loss of child 07/29/2016  . Acute upper respiratory infection 02/27/2016  . Allergic rhinitis 02/27/2016  . Concussion 02/05/2016  . Breast pain, right 02/05/2016  . Rash and nonspecific skin eruption 11/13/2015  . Muscle cramping 10/12/2015  . Lightheadedness 08/21/2015  . GERD (gastroesophageal reflux disease) 08/06/2015  . Baker's cyst, ruptured 08/03/2015  . Hip pain 06/29/2015  . Type 2 diabetes mellitus (HCC) 06/29/2015  . Arthritis of left knee 06/15/2015  . Left knee pain 06/09/2015  . Pain of left side of body 02/22/2015  . Benign essential HTN 02/18/2015  . Cellulitis 02/18/2015  . Sciatica 01/17/2015  . Routine general medical examination at a health care facility 12/26/2014  . Medicare annual wellness visit, subsequent 12/26/2014    02/26/2015, SPT 04/10/17  12:09 PM   [facility] [address]  [address] [place], [place], [place] Phone: [phone]   Fax:  [phone]  Name: JONETTA DAGLEY MRN: Rebekah Chesterfield Date of Birth: [birth year]

## 2017-04-14 ENCOUNTER — Ambulatory Visit: Payer: PPO | Admitting: Physical Therapy

## 2017-04-14 ENCOUNTER — Telehealth: Payer: Self-pay | Admitting: Family

## 2017-04-14 NOTE — Telephone Encounter (Signed)
Pls call pt vback and make her an appt w/new provider. Per last OV w/Greg he stated to return back in Oct. No appt was made...Meredith Phillips

## 2017-04-14 NOTE — Telephone Encounter (Signed)
[facility] - Client TELEPHONE ADVICE RECORD [facility]  Patient Name: Meredith Phillips  DOB: [birth year]    Initial Comment Caller needs to know why her blood is too thick for her to take her BS. Needs to make an appt. Dr. Amador Cunas    Nurse Assessment      Guidelines    Guideline Title Affirmed Question Affirmed Notes       Final Disposition User   FINAL ATTEMPT MADE - no message left Odis Luster, Charity fundraiser, Bjorn Loser

## 2017-04-14 NOTE — Telephone Encounter (Signed)
Pt is scheduled to see [facility] tomorrow to discuss the thick blood issue and also high blood pressure. She is scheduled to see Apolonio Schneiders to establish care in December.

## 2017-04-15 ENCOUNTER — Ambulatory Visit (INDEPENDENT_AMBULATORY_CARE_PROVIDER_SITE_OTHER): Payer: PPO | Admitting: Nurse Practitioner

## 2017-04-15 ENCOUNTER — Encounter: Payer: Self-pay | Admitting: Nurse Practitioner

## 2017-04-15 ENCOUNTER — Other Ambulatory Visit (INDEPENDENT_AMBULATORY_CARE_PROVIDER_SITE_OTHER): Payer: PPO

## 2017-04-15 VITALS — BP 148/86 | HR 79 | Temp 98.3°F | Ht 63.0 in | Wt 154.0 lb

## 2017-04-15 DIAGNOSIS — Z780 Asymptomatic menopausal state: Secondary | ICD-10-CM

## 2017-04-15 DIAGNOSIS — Z634 Disappearance and death of family member: Secondary | ICD-10-CM | POA: Diagnosis not present

## 2017-04-15 DIAGNOSIS — F4321 Adjustment disorder with depressed mood: Secondary | ICD-10-CM

## 2017-04-15 DIAGNOSIS — R011 Cardiac murmur, unspecified: Secondary | ICD-10-CM

## 2017-04-15 DIAGNOSIS — R5382 Chronic fatigue, unspecified: Secondary | ICD-10-CM | POA: Diagnosis not present

## 2017-04-15 DIAGNOSIS — E119 Type 2 diabetes mellitus without complications: Secondary | ICD-10-CM | POA: Diagnosis not present

## 2017-04-15 DIAGNOSIS — Z136 Encounter for screening for cardiovascular disorders: Secondary | ICD-10-CM

## 2017-04-15 DIAGNOSIS — Z13220 Encounter for screening for lipoid disorders: Secondary | ICD-10-CM

## 2017-04-15 DIAGNOSIS — K2100 Gastro-esophageal reflux disease with esophagitis, without bleeding: Secondary | ICD-10-CM

## 2017-04-15 DIAGNOSIS — I10 Essential (primary) hypertension: Secondary | ICD-10-CM | POA: Diagnosis not present

## 2017-04-15 DIAGNOSIS — Z23 Encounter for immunization: Secondary | ICD-10-CM

## 2017-04-15 LAB — BASIC METABOLIC PANEL
BUN: 8 mg/dL (ref 6–23)
CO2: 29 meq/L (ref 19–32)
Calcium: 10.2 mg/dL (ref 8.4–10.5)
Chloride: 104 mEq/L (ref 96–112)
Creatinine, Ser: 0.7 mg/dL (ref 0.40–1.20)
GFR: 105.25 mL/min (ref >60.00)
Glucose, Bld: 98 mg/dL (ref 70–99)
POTASSIUM: 3.4 meq/L — AB (ref 3.5–5.1)
SODIUM: 140 meq/L (ref 135–145)

## 2017-04-15 LAB — HEMOGLOBIN A1C: HEMOGLOBIN A1C: 5.4 % (ref 4.6–6.5)

## 2017-04-15 LAB — LIPID PANEL
CHOLESTEROL: 160 mg/dL (ref 0–200)
HDL: 59.1 mg/dL (ref >39.00)
LDL Cholesterol: 88 mg/dL (ref 0–99)
NonHDL: 101.3
Total CHOL/HDL Ratio: 3
Triglycerides: 67 mg/dL (ref 0.0–149.0)
VLDL: 13.4 mg/dL (ref 0.0–40.0)

## 2017-04-15 LAB — TSH: TSH: 0.77 u[IU]/mL (ref 0.35–4.50)

## 2017-04-15 MED ORDER — SERTRALINE HCL 100 MG PO TABS: 100.0000 mg | ORAL_TABLET | Freq: Every day | ORAL | 1 refills | Status: DC
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
## 2017-04-15 NOTE — Patient Instructions (Addendum)
Stable lab results. Pending Vitamin D.  Make appt at front desk for bone density.  I have increased zoloft dose to 100mg .  [place] Office: [address]

## 2017-04-17 ENCOUNTER — Ambulatory Visit: Payer: PPO | Attending: Family | Admitting: Physical Therapy

## 2017-04-17 DIAGNOSIS — M25551 Pain in right hip: Secondary | ICD-10-CM | POA: Diagnosis not present

## 2017-04-17 DIAGNOSIS — Z7409 Other reduced mobility: Secondary | ICD-10-CM | POA: Diagnosis not present

## 2017-04-17 DIAGNOSIS — R29898 Other symptoms and signs involving the musculoskeletal system: Secondary | ICD-10-CM | POA: Insufficient documentation

## 2017-04-17 DIAGNOSIS — R293 Abnormal posture: Secondary | ICD-10-CM | POA: Insufficient documentation

## 2017-04-17 DIAGNOSIS — R531 Weakness: Secondary | ICD-10-CM | POA: Insufficient documentation

## 2017-04-17 DIAGNOSIS — G8929 Other chronic pain: Secondary | ICD-10-CM

## 2017-04-17 DIAGNOSIS — M79604 Pain in right leg: Secondary | ICD-10-CM

## 2017-04-17 DIAGNOSIS — M2560 Stiffness of unspecified joint, not elsewhere classified: Secondary | ICD-10-CM

## 2017-04-17 DIAGNOSIS — M623 Immobility syndrome (paraplegic): Secondary | ICD-10-CM | POA: Insufficient documentation

## 2017-04-17 DIAGNOSIS — M545 Low back pain: Secondary | ICD-10-CM | POA: Insufficient documentation

## 2017-04-17 DIAGNOSIS — R6889 Other general symptoms and signs: Secondary | ICD-10-CM

## 2017-04-17 MED ORDER — METFORMIN HCL ER 500 MG PO TB24: 500.0000 mg | ORAL_TABLET | Freq: Every day | ORAL | 1 refills | Status: DC

## 2017-04-17 MED ORDER — CARVEDILOL 3.125 MG PO TABS: 3.1250 mg | ORAL_TABLET | Freq: Two times a day (BID) | ORAL | 1 refills | Status: DC

## 2017-04-17 MED ORDER — ESOMEPRAZOLE MAGNESIUM 20 MG PO CPDR: 20.0000 mg | DELAYED_RELEASE_CAPSULE | Freq: Every day | ORAL | 1 refills | Status: DC

## 2017-04-17 NOTE — Therapy (Signed)
[facility] Outpatient Rehabilitation [facility] [address]  [address] [place], [place], [address] Phone: [phone]   Fax:  [phone]  Physical Therapy Treatment  Patient Details  Name: Meredith Phillips MRN: [id number] Date of Birth: 08/07/1945 Referring Provider: Jeanine Luz, FNP  Encounter Date: 04/17/2017      PT End of Session - 04/17/17 1027    Visit Number 4   Number of Visits 12   Date for PT Re-Evaluation 05/11/17   Authorization Type [employer]   PT Start Time 0930   PT Stop Time 1017   PT Time Calculation (min) 47 min   Activity Tolerance Patient tolerated treatment well   Behavior During Therapy [facility] for tasks assessed/performed      Past Medical History:  Diagnosis Date  . Arthritis   . Benign essential HTN 02/18/2015  . Cellulitis 02/18/2015  . Chicken pox   . Diabetes mellitus without complication (HCC)   . Diabetic peripheral neuropathy (HCC)   . GERD (gastroesophageal reflux disease)   . Hypercholesteremia   . Hypertension   . Liver disease   . Polyp of colon     Past Surgical History:  Procedure Laterality Date  . APPENDECTOMY    . HERNIA REPAIR    . KNEE SURGERY    . ROTATOR CUFF REPAIR    . TONSILLECTOMY      There were no vitals filed for this visit.      Subjective Assessment - 04/17/17 1025    Subjective Patient today with reports of significant pain yesterday in R lumbar/glute area. Moderate pain this morning. Patient reporting good compliance with HEP and noted improvements in strength and pain.    Currently in Pain? Yes   Pain Score 4    Pain Location Hip   Pain Orientation Right   Pain Descriptors / Indicators Aching;Sore   Pain Type Chronic pain                         [facility] Adult PT Treatment/Exercise - 04/17/17 0934      Lumbar Exercises: Aerobic   Tread Mill 5 min; 1.4 speed     Lumbar Exercises: Standing   Wall Slides 15 reps   Wall Slides Limitations orange p-ball on wall;  green ball between knees     Lumbar Exercises: Supine   Bridge 15 reps;5 seconds   Bridge Limitations red tband at knees   Other Supine Lumbar Exercises dead bug alt. R/L LEs only; arms held straight up; 10 each side     Lumbar Exercises: Sidelying   Hip Abduction 15 reps;2 seconds   Hip Abduction Weights (lbs) 2#     Lumbar Exercises: Quadruped   Straight Leg Raise 15 reps;2 seconds   Straight Leg Raises Limitations Right; Left     Manual Therapy   Manual Therapy Soft tissue mobilization   Soft tissue mobilization STM to R glute med/piriformis; TTP with noted trigger point                  PT Short Term Goals - 04/07/17 0916      PT SHORT TERM GOAL #1   Title patient to be independent with initial HEP   Status On-going           PT Long Term Goals - 04/07/17 0916      PT LONG TERM GOAL #1   Title patient to be independwnt with advanced HEP   Status  On-going     PT LONG TERM GOAL #2   Title Patient to demosntrate lumbar AROM to [facility] and symmetrical without pain provocation for improved functional mobility   Status On-going     PT LONG TERM GOAL #3   Title Patient to report pain decrease at R back/buttock by 50-75%   Status On-going     PT LONG TERM GOAL #4   Title Patient to improve B LE strength to >/= 4+/5   Status On-going     PT LONG TERM GOAL #5   Title Patient to report ability to sit for polonged times as well as perform ADLs and household tasks wihtout pain limiting   Status On-going               Plan - 04/17/17 1028    Clinical Impression Statement Patient reporting specific area of pain in R lumbar/glute area this morning. Manual therapy STM performed with patient TTP and noted trigger points in area. Continued patient hip strengthenting exercises and progressed to include more core involvement in order to promote increased functional mobility.    PT Treatment/Interventions ADLs/Self Care Home Management;Cryotherapy;Electrical  Stimulation;Iontophoresis 4mg /ml Dexamethasone;Moist Heat;Ultrasound;Traction;Neuromuscular re-education;Therapeutic exercise;Therapeutic activities;Functional mobility training;Patient/family education;Stair training;Manual techniques;Passive range of motion;Vasopneumatic Device;Taping;Dry needling      Patient will benefit from skilled therapeutic intervention in order to improve the following deficits and impairments:  Decreased activity tolerance, Decreased mobility, Difficulty walking, Decreased strength, Pain  Visit Diagnosis: Chronic right-sided low back pain, with sciatica presence unspecified  Pain in right hip  Other symptoms and signs involving the musculoskeletal system  Pain of right lower extremity  Stiffness due to immobility  Abnormal posture  Decreased strength, endurance, and mobility     Problem List Patient Active Problem List   Diagnosis Date Noted  . Murmur, cardiac 04/15/2017  . Asymptomatic postmenopausal estrogen deficiency 04/15/2017  . Right-sided low back pain without sciatica 02/10/2017  . Dark stools 12/23/2016  . Arthralgia 12/23/2016  . Grief at loss of child 07/29/2016  . Acute upper respiratory infection 02/27/2016  . Allergic rhinitis 02/27/2016  . Concussion 02/05/2016  . Breast pain, right 02/05/2016  . Rash and nonspecific skin eruption 11/13/2015  . Muscle cramping 10/12/2015  . Lightheadedness 08/21/2015  . GERD (gastroesophageal reflux disease) 08/06/2015  . Baker's cyst, ruptured 08/03/2015  . Hip pain 06/29/2015  . Type 2 diabetes mellitus (HCC) 06/29/2015  . Arthritis of left knee 06/15/2015  . Left knee pain 06/09/2015  . Pain of left side of body 02/22/2015  . Benign essential HTN 02/18/2015  . Cellulitis 02/18/2015  . Sciatica 01/17/2015  . Routine general medical examination at a health care facility 12/26/2014  . Medicare annual wellness visit, subsequent 12/26/2014      02/26/2015, SPT 04/17/17 10:36  AM    [facility] [address]  [address] [place], [place], [place] Phone: [phone]   Fax:  [phone]  Name: Meredith Phillips MRN: Rebekah Chesterfield Date of Birth: 04-26-1946

## 2017-04-21 ENCOUNTER — Encounter: Payer: Self-pay | Admitting: *Deleted

## 2017-04-21 LAB — VITAMIN D 1,25 DIHYDROXY
Vitamin D 1, 25 (OH)2 Total: 43 pg/mL (ref 18–72)
Vitamin D3 1, 25 (OH)2: 43 pg/mL

## 2017-04-22 ENCOUNTER — Ambulatory Visit (INDEPENDENT_AMBULATORY_CARE_PROVIDER_SITE_OTHER)
Admission: RE | Admit: 2017-04-22 | Discharge: 2017-04-22 | Disposition: A | Discharge status: Home / Self Care | Payer: PPO | Source: Ambulatory Visit | Attending: Family | Admitting: Family

## 2017-04-22 ENCOUNTER — Encounter: Payer: Self-pay | Admitting: Physical Therapy

## 2017-04-22 ENCOUNTER — Ambulatory Visit: Payer: PPO | Admitting: Physical Therapy

## 2017-04-22 DIAGNOSIS — G8929 Other chronic pain: Secondary | ICD-10-CM

## 2017-04-22 DIAGNOSIS — M79604 Pain in right leg: Secondary | ICD-10-CM

## 2017-04-22 DIAGNOSIS — M545 Low back pain: Secondary | ICD-10-CM | POA: Diagnosis not present

## 2017-04-22 DIAGNOSIS — Z780 Asymptomatic menopausal state: Secondary | ICD-10-CM | POA: Diagnosis not present

## 2017-04-22 DIAGNOSIS — R29898 Other symptoms and signs involving the musculoskeletal system: Secondary | ICD-10-CM

## 2017-04-22 DIAGNOSIS — M2560 Stiffness of unspecified joint, not elsewhere classified: Secondary | ICD-10-CM

## 2017-04-22 DIAGNOSIS — R531 Weakness: Secondary | ICD-10-CM

## 2017-04-22 DIAGNOSIS — Z7409 Other reduced mobility: Secondary | ICD-10-CM

## 2017-04-22 DIAGNOSIS — M25551 Pain in right hip: Secondary | ICD-10-CM

## 2017-04-22 DIAGNOSIS — R293 Abnormal posture: Secondary | ICD-10-CM

## 2017-04-22 NOTE — Therapy (Signed)
[facility] [address]  [address] [place], [place], [address] Phone: [phone]   Fax:  [phone]  Physical Therapy Treatment  Patient Details  Name: Meredith Phillips MRN: [id number] Date of Birth: [birth year] Referring Provider: Jeanine Luz, FNP   Encounter Date: 04/22/2017  PT End of Session - 04/22/17 1401    Visit Number  5    Number of Visits  12    Date for PT Re-Evaluation  05/11/17    Authorization Type  [employer]    PT Start Time  1300    PT Stop Time  1348    PT Time Calculation (min)  48 min    Activity Tolerance  Patient tolerated treatment well    Behavior During Therapy  [facility] for tasks assessed/performed       Past Medical History:  Diagnosis Date  . Arthritis   . Benign essential HTN 02/18/2015  . Cellulitis 02/18/2015  . Chicken pox   . Diabetes mellitus without complication (HCC)   . Diabetic peripheral neuropathy (HCC)   . GERD (gastroesophageal reflux disease)   . Hypercholesteremia   . Hypertension   . Liver disease   . Polyp of colon     Past Surgical History:  Procedure Laterality Date  . APPENDECTOMY    . HERNIA REPAIR    . KNEE SURGERY    . ROTATOR CUFF REPAIR    . TONSILLECTOMY      There were no vitals filed for this visit.  Subjective Assessment - 04/22/17 1353    Subjective  Patient reporting continued pain in R lumbar/SI area that improves with stretching and walking around. Patient is reporting dizziness today and feels off balance, has also been caring for a sick grandchild so hasn't gotten much sleep this week. Patient reporting good response to inonto patches and wishes to continue.     Currently in Pain?  Yes    Pain Score  6     Pain Location  Hip    Pain Orientation  Right    Pain Descriptors / Indicators  Aching;Sore    Pain Type  Chronic pain                      [facility] Adult PT Treatment/Exercise - 04/22/17 1357      Lumbar Exercises: Aerobic    Tread Mill  6 min; 1.2 speed      Lumbar Exercises: Supine   Bridge  15 reps;5 seconds    Bridge Limitations  red tband at knees    Straight Leg Raise  15 reps    Straight Leg Raises Limitations  R/L; 2#    Other Supine Lumbar Exercises  dead bug alt. R/L LEs only; arms held straight up; 10 each side      Lumbar Exercises: Sidelying   Hip Abduction  15 reps;2 seconds    Hip Abduction Weights (lbs)  2#      Lumbar Exercises: Prone   Other Prone Lumbar Exercises  bent knee hip extenison; R/L; 2#; 10 each      Modalities   Modalities  Iontophoresis      Iontophoresis   Type of Iontophoresis  Dexamethasone    Location  R PSIS    Dose  1.0 mL    Time  4-6 Hours; 73mA      Manual Therapy   Manual Therapy  Soft tissue mobilization    Soft tissue mobilization  STM to  R glute med/piriformis; TTP with noted trigger point               PT Short Term Goals - 04/07/17 0916      PT SHORT TERM GOAL #1   Title  patient to be independent with initial HEP    Status  On-going        PT Long Term Goals - 04/07/17 0916      PT LONG TERM GOAL #1   Title  patient to be independwnt with advanced HEP    Status  On-going      PT LONG TERM GOAL #2   Title  Patient to demosntrate lumbar [facility]OM to [facility] and symmetrical without pain provocation for improved functional mobility    Status  On-going      PT LONG TERM GOAL #3   Title  Patient to report pain decrease at R back/buttock by 50-75%    Status  On-going      PT LONG TERM GOAL #4   Title  Patient to improve B LE strength to >/= 4+/5    Status  On-going      PT LONG TERM GOAL #5   Title  Patient to report ability to sit for polonged times as well as perform ADLs and household tasks wihtout pain limiting    Status  On-going            Plan - 04/22/17 1402    Clinical Impression Statement  Patient continues to report higher levels of pain at home on days with no PT but finds significant relief after each PT session.  Continued with ionto patch after session today per patient request. Patient unable to progress strengthening exercises as planned today due to dizziness, however patient doing well with all exercises she was able to do.     PT Treatment/Interventions  ADLs/Self Care Home Management;Cryotherapy;Electrical Stimulation;Iontophoresis 4mg /ml Dexamethasone;Moist Heat;Ultrasound;Traction;Neuromuscular re-education;Therapeutic exercise;Therapeutic activities;Functional mobility training;Patient/family education;Stair training;Manual techniques;Passive range of motion;Vasopneumatic Device;Taping;Dry needling       Patient will benefit from skilled therapeutic intervention in order to improve the following deficits and impairments:  Decreased activity tolerance, Decreased mobility, Difficulty walking, Decreased strength, Pain  Visit Diagnosis: Chronic right-sided low back pain, with sciatica presence unspecified  Pain in right hip  Other symptoms and signs involving the musculoskeletal system  Pain of right lower extremity  Stiffness due to immobility  Abnormal posture  Decreased strength, endurance, and mobility     Problem List Patient Active Problem List   Diagnosis Date Noted  . Murmur, cardiac 04/15/2017  . Asymptomatic postmenopausal estrogen deficiency 04/15/2017  . Right-sided low back pain without sciatica 02/10/2017  . Dark stools 12/23/2016  . Arthralgia 12/23/2016  . Grief at loss of child 07/29/2016  . Acute upper respiratory infection 02/27/2016  . Allergic rhinitis 02/27/2016  . Concussion 02/05/2016  . Breast pain, right 02/05/2016  . Rash and nonspecific skin eruption 11/13/2015  . Muscle cramping 10/12/2015  . Lightheadedness 08/21/2015  . GERD (gastroesophageal reflux disease) 08/06/2015  . Baker's cyst, ruptured 08/03/2015  . Hip pain 06/29/2015  . Type 2 diabetes mellitus (HCC) 06/29/2015  . Arthritis of left knee 06/15/2015  . Left knee pain 06/09/2015  .  Pain of left side of body 02/22/2015  . Benign essential HTN 02/18/2015  . Cellulitis 02/18/2015  . Sciatica 01/17/2015  . Routine general medical examination at a health care facility 12/26/2014  . Medicare annual wellness visit, subsequent 12/26/2014     Emerson MonteKimberly Candita Borenstein,  SPT 04/22/17 3:05 PM   [facility] [address]  [address] [place], [place], [address] Phone: [phone]   Fax:  [phone]  Name: JOYCELYN LISKA MRN: [id number] Date of Birth: [birth year]

## 2017-04-24 ENCOUNTER — Encounter: Payer: Self-pay | Admitting: Physical Therapy

## 2017-04-24 ENCOUNTER — Ambulatory Visit: Payer: PPO | Admitting: Physical Therapy

## 2017-04-24 DIAGNOSIS — M25551 Pain in right hip: Secondary | ICD-10-CM

## 2017-04-24 DIAGNOSIS — M79604 Pain in right leg: Secondary | ICD-10-CM

## 2017-04-24 DIAGNOSIS — M2560 Stiffness of unspecified joint, not elsewhere classified: Secondary | ICD-10-CM

## 2017-04-24 DIAGNOSIS — Z7409 Other reduced mobility: Secondary | ICD-10-CM

## 2017-04-24 DIAGNOSIS — M545 Low back pain: Principal | ICD-10-CM

## 2017-04-24 DIAGNOSIS — G8929 Other chronic pain: Secondary | ICD-10-CM

## 2017-04-24 DIAGNOSIS — R293 Abnormal posture: Secondary | ICD-10-CM

## 2017-04-24 DIAGNOSIS — R531 Weakness: Secondary | ICD-10-CM

## 2017-04-24 DIAGNOSIS — R29898 Other symptoms and signs involving the musculoskeletal system: Secondary | ICD-10-CM

## 2017-04-24 NOTE — Therapy (Signed)
[facility] [address]  [address] [place], [place], [address] Phone: [phone]   Fax:  [phone]  Physical Therapy Treatment  Patient Details  Name: Meredith Phillips MRN: [id number] Date of Birth: Jan 31, 1946 Referring Provider: Jeanine Luz, FNP   Encounter Date: 04/24/2017  PT End of Session - 04/24/17 0958    Visit Number  6    Number of Visits  12    Date for PT Re-Evaluation  05/11/17    Authorization Type  [employer]    PT Start Time  0905    PT Stop Time  0954    PT Time Calculation (min)  49 min    Activity Tolerance  Patient tolerated treatment well    Behavior During Therapy  [facility] for tasks assessed/performed       Past Medical History:  Diagnosis Date  . Arthritis   . Benign essential HTN 02/18/2015  . Cellulitis 02/18/2015  . Chicken pox   . Diabetes mellitus without complication (HCC)   . Diabetic peripheral neuropathy (HCC)   . GERD (gastroesophageal reflux disease)   . Hypercholesteremia   . Hypertension   . Liver disease   . Polyp of colon     Past Surgical History:  Procedure Laterality Date  . APPENDECTOMY    . HERNIA REPAIR    . KNEE SURGERY    . ROTATOR CUFF REPAIR    . TONSILLECTOMY      There were no vitals filed for this visit.  Subjective Assessment - 04/24/17 0910    Subjective  Patient has been very busy all week with sick family members and caring for them, however reporting significant decrease in pain today and says she is feeling much better. Reporting good response with ionto from last visit.    Currently in Pain?  No/denies    Pain Score  0-No pain                      [facility] Adult PT Treatment/Exercise - 04/24/17 0916      Lumbar Exercises: Aerobic   Stationary Bike  L2; 6 min      Lumbar Exercises: Machines for Strengthening   Cybex Knee Extension  B; 10#; 15 reps    Cybex Knee Flexion  B; 15#; 15 reps      Lumbar Exercises: Standing   Wall  Slides  15 reps    Wall Slides Limitations  ball between knees    Other Standing Lumbar Exercises  step ups; R/L; 15 reps forward; 15 reps lateral; 2# on B ankles; UE support at counter    Other Standing Lumbar Exercises  side stepping; red tband at ankles; 37ft each way; single hand hold support      Lumbar Exercises: Seated   Hip Flexion on Ball  Right;Left;15 reps    Other Seated Lumbar Exercises  alt. UE flexion on ball; 15 reps               PT Short Term Goals - 04/07/17 0916      PT SHORT TERM GOAL #1   Title  patient to be independent with initial HEP    Status  On-going        PT Long Term Goals - 04/07/17 0916      PT LONG TERM GOAL #1   Title  patient to be independwnt with advanced HEP    Status  On-going      PT  LONG TERM GOAL #2   Title  Patient to demosntrate lumbar AROM to [facility] and symmetrical without pain provocation for improved functional mobility    Status  On-going      PT LONG TERM GOAL #3   Title  Patient to report pain decrease at R back/buttock by 50-75%    Status  On-going      PT LONG TERM GOAL #4   Title  Patient to improve B LE strength to >/= 4+/5    Status  On-going      PT LONG TERM GOAL #5   Title  Patient to report ability to sit for polonged times as well as perform ADLs and household tasks wihtout pain limiting    Status  On-going            Plan - 04/24/17 0958    Clinical Impression Statement  Patient feeling much better today so able to progress exercises to incorporate more LE and core strengthening and performing most exercises in weight bearing. Patient requires verbal cues to stay on task and maintain proper technique with exercises. Patient tolerated treatment well. Will continue to progress as patient tolerates.     PT Treatment/Interventions  ADLs/Self Care Home Management;Cryotherapy;Electrical Stimulation;Iontophoresis 4mg /ml Dexamethasone;Moist Heat;Ultrasound;Traction;Neuromuscular re-education;Therapeutic  exercise;Therapeutic activities;Functional mobility training;Patient/family education;Stair training;Manual techniques;Passive range of motion;Vasopneumatic Device;Taping;Dry needling       Patient will benefit from skilled therapeutic intervention in order to improve the following deficits and impairments:  Decreased activity tolerance, Decreased mobility, Difficulty walking, Decreased strength, Pain  Visit Diagnosis: Chronic right-sided low back pain, with sciatica presence unspecified  Pain in right hip  Other symptoms and signs involving the musculoskeletal system  Pain of right lower extremity  Stiffness due to immobility  Abnormal posture  Decreased strength, endurance, and mobility     Problem List Patient Active Problem List   Diagnosis Date Noted  . Murmur, cardiac 04/15/2017  . Asymptomatic postmenopausal estrogen deficiency 04/15/2017  . Right-sided low back pain without sciatica 02/10/2017  . Dark stools 12/23/2016  . Arthralgia 12/23/2016  . Grief at loss of child 07/29/2016  . Acute upper respiratory infection 02/27/2016  . Allergic rhinitis 02/27/2016  . Concussion 02/05/2016  . Breast pain, right 02/05/2016  . Rash and nonspecific skin eruption 11/13/2015  . Muscle cramping 10/12/2015  . Lightheadedness 08/21/2015  . GERD (gastroesophageal reflux disease) 08/06/2015  . Baker's cyst, ruptured 08/03/2015  . Hip pain 06/29/2015  . Type 2 diabetes mellitus (HCC) 06/29/2015  . Arthritis of left knee 06/15/2015  . Left knee pain 06/09/2015  . Pain of left side of body 02/22/2015  . Benign essential HTN 02/18/2015  . Cellulitis 02/18/2015  . Sciatica 01/17/2015  . Routine general medical examination at a health care facility 12/26/2014  . Medicare annual wellness visit, subsequent 12/26/2014     02/26/2015, SPT 04/24/17 10:03 AM    [facility] [address]  [address] [place],  [place], [place] Phone: [phone]   Fax:  [phone]  Name: DARLINDA BELLOWS MRN: Rebekah [place] Date of Birth: [birth year]

## 2017-04-28 ENCOUNTER — Ambulatory Visit: Payer: Self-pay | Admitting: *Deleted

## 2017-04-28 ENCOUNTER — Ambulatory Visit: Payer: PPO | Admitting: Physical Therapy

## 2017-04-30 ENCOUNTER — Other Ambulatory Visit: Payer: Self-pay | Admitting: *Deleted

## 2017-04-30 ENCOUNTER — Encounter: Payer: Self-pay | Admitting: *Deleted

## 2017-04-30 NOTE — Patient Outreach (Signed)
[facility]) Care Management   04/30/2017  Meredith Phillips XNATFT Mar 23, 1946 [id number]  Meredith Phillips is an 71 y.o. female  Subjective:  HTN: Reports one elevated reading 148/98 around the same time her daughter passed away last 06/04/23. Pt states family event generated some symptoms of distress and pt mourned over her loss. Pt denies any symptoms at that time but reports she takes Tylenol for her headaches (rarely) which this occur. Pt states doesn't occur that often but will remember to document all her BP readings and report any ongoing elevated BP to her provider. Pt receptive to the review of her plan of care and agreed to another home visit next month based upon her adherence. MEDICATIONS: Pt reports she has filled all her medications and continues to take all prescribed medications as recommended.  COLONOSCOPY: Pt reports she needs to have this procedure and pt has indicated she will scheduled this procedure as recommended.   Objective:   Review of Systems  All other systems reviewed and are negative.   Physical Exam  Constitutional: She is oriented to person, place, and time. She appears well-developed.  HENT:  Right Ear: External ear normal.  Left Ear: External ear normal.  Eyes: EOM are normal.  Neck: Normal range of motion.  Cardiovascular: Normal heart sounds.  Respiratory: Effort normal and breath sounds normal.  GI: Soft. Bowel sounds are normal.  Musculoskeletal: Normal range of motion.  Neurological: She is alert and oriented to person, place, and time.  Skin: Skin is warm and dry.  Psychiatric: She has a normal mood and affect. Her behavior is normal. Judgment and thought content normal.    Encounter Medications:   Outpatient Encounter Medications as of 04/30/2017  Medication Sig  . amLODipine (NORVASC) 10 MG tablet TAKE 1 TABLET (10 MG TOTAL) BY MOUTH DAILY.  . blood glucose meter kit and supplies KIT Dispense based on patient and insurance preference.  Use up to four times daily as directed. E11.9 .  . carvedilol (COREG) 3.125 MG tablet Take 1 tablet (3.125 mg total) by mouth 2 (two) times daily with a meal.  . Cholecalciferol (VITAMIN D-3) 1000 UNITS CAPS Take 1,000 Units by mouth.  . clobetasol cream (TEMOVATE) 7.06 % Apply 1 application topically 2 (two) times daily. Do not use for more than two weeks at one time  . esomeprazole (NEXIUM) 20 MG capsule Take 1 capsule (20 mg total) by mouth daily at 12 noon.  . gabapentin (NEURONTIN) 300 MG capsule TAKE 1 CAPSULE BY MOUTH THREE TIMES A DAY  . glucose blood (ONE TOUCH ULTRA TEST) test strip TEST UP TO 4 TIMES DAILY AS DIRECTED  . losartan (COZAAR) 100 MG tablet TAKE 1 TABLET BY MOUTH EVERY DAY  . metFORMIN (GLUCOPHAGE-XR) 500 MG 24 hr tablet Take 1 tablet (500 mg total) by mouth daily with breakfast. Take 1 tablet by mouth 2 times daily with meals.  [name] DELICA LANCETS FINE MISC TEST UP TO 4 TIMES DAILY AS DIRECTED  . sertraline (ZOLOFT) 100 MG tablet Take 1 tablet (100 mg total) by mouth daily.  . traMADol (ULTRAM) 50 MG tablet Take 1 tablet (50 mg total) by mouth every 8 (eight) hours as needed.  . triamcinolone (NASACORT AQ) 55 MCG/ACT AERO nasal inhaler Place 2 sprays into the nose daily.   No facility-administered encounter medications on file as of 04/30/2017.     Functional Status:   In your present state of health, do you have any difficulty performing  the following activities: 04/30/2017 12/30/2016  Hearing? N N  Vision? N N  Difficulty concentrating or making decisions? N N  Walking or climbing stairs? N N  Dressing or bathing? N N  Doing errands, shopping? Tempie Donning  Preparing Food and eating ? N N  Using the Toilet? N N  In the past six months, have you accidently leaked urine? N N  Do you have problems with loss of bowel control? N N  Managing your Medications? N N  Managing your Finances? N N  Housekeeping or managing your Housekeeping? N N  Some recent data might be  hidden    Fall/Depression Screening:    Fall Risk  04/30/2017 12/30/2016 02/12/2016  Falls in the past year? No No No   PHQ 2/9 Scores 04/30/2017 12/30/2016 02/12/2016 12/26/2014  PHQ - 2 Score 0 0 0 0   BP (!) 142/88 (BP Location: Left Arm, Patient Position: Sitting, Cuff Size: Normal)   Pulse 66   Resp 20   Ht 1.6 m ('5\' 3"' )   Wt 153 lb (69.4 kg)   SpO2 97%   BMI 27.10 kg/m  Assessment:   Ongoing case management related HTN Follow up on medications refills and adherence to administration Education on colonoscopy  Plan:  Will verify pt's adherence in managing her HTN however experiencing some stressors. RN will discuss how to reduce encountered stressors to avoid elevated BP. Will encouraged pt to document all her home BP readings and monitoring her BP during the time of elevation and report to her provider if no improvement.  Will verified pt has obtained all her medications and taking all medications as recommended. Will continue to encouraged adherence with her medications. Will educate pt on the process of a colonoscopy and strongly encouraged pt to complete the paperwork ad scheduled for this procedure. Plan of care discussed with goals met and generated according to pt's plan of care. Will discussed interventions and re-evaluate next month on the next home visit.  Raina Mina, RN Care Management Coordinator [place] Office [phone]

## 2017-05-01 ENCOUNTER — Ambulatory Visit: Payer: PPO | Admitting: Physical Therapy

## 2017-05-01 ENCOUNTER — Encounter: Payer: Self-pay | Admitting: Physical Therapy

## 2017-05-01 DIAGNOSIS — M545 Low back pain: Principal | ICD-10-CM

## 2017-05-01 DIAGNOSIS — Z7409 Other reduced mobility: Secondary | ICD-10-CM

## 2017-05-01 DIAGNOSIS — R29898 Other symptoms and signs involving the musculoskeletal system: Secondary | ICD-10-CM

## 2017-05-01 DIAGNOSIS — R531 Weakness: Secondary | ICD-10-CM

## 2017-05-01 DIAGNOSIS — M2560 Stiffness of unspecified joint, not elsewhere classified: Secondary | ICD-10-CM

## 2017-05-01 DIAGNOSIS — R6889 Other general symptoms and signs: Secondary | ICD-10-CM

## 2017-05-01 DIAGNOSIS — M79604 Pain in right leg: Secondary | ICD-10-CM

## 2017-05-01 DIAGNOSIS — R293 Abnormal posture: Secondary | ICD-10-CM

## 2017-05-01 DIAGNOSIS — M25551 Pain in right hip: Secondary | ICD-10-CM

## 2017-05-01 DIAGNOSIS — G8929 Other chronic pain: Secondary | ICD-10-CM

## 2017-05-01 NOTE — Patient Instructions (Signed)
Wall Squat    Feet shoulder width apart, lean against wall. Heels on floor, knees parallel, bend hips and knees to almost 90. Hold _3___ seconds. Lower slowly. Repeat __10__ times. Do __2__ sets per session.     Row: Mid-Range - Standing    With yellow band anchored at chest level, pull elbows backward, squeezing shoulder blades together. Keep head and spine neutral. Row _15__ times, _2__ times per session     Calf Stretch    Stand with hands supported on wall, elbows slightly bent, front knee bent, back knee straight, feet parallel and both heels on floor. Lean into wall by pushing hips forward until a stretch is felt in calf muscle. Hold _30___ seconds. Repeat with leg positions switched.     Supine: Leg Stretch With Strap (Basic)    Lie on back with one knee bent, foot flat on floor. Hook strap around other foot. Straighten knee. Keep knee level with other knee. Hold _30__ seconds. Relax leg completely down to floor.  Repeat _2__ times per session.

## 2017-05-01 NOTE — Therapy (Signed)
[facility] Outpatient Rehabilitation [facility] [address]  [address] [place], [place], [address] Phone: [phone]   Fax:  [phone]  Physical Therapy Treatment  Patient Details  Name: Meredith Phillips MRN: [id number] Date of Birth: 1945/10/27 Referring Provider: Jeanine Luz, FNP   Encounter Date: 05/01/2017  PT End of Session - 05/01/17 1118    Visit Number  7    Number of Visits  12    Date for PT Re-Evaluation  05/11/17    Authorization Type  [employer]    PT Start Time  0928    PT Stop Time  1016    PT Time Calculation (min)  48 min    Activity Tolerance  Patient tolerated treatment well    Behavior During Therapy  [facility] for tasks assessed/performed       Past Medical History:  Diagnosis Date  . Arthritis   . Benign essential HTN 02/18/2015  . Cellulitis 02/18/2015  . Chicken pox   . Diabetes mellitus without complication (HCC)   . Diabetic peripheral neuropathy (HCC)   . GERD (gastroesophageal reflux disease)   . Hypercholesteremia   . Hypertension   . Liver disease   . Polyp of colon     Past Surgical History:  Procedure Laterality Date  . APPENDECTOMY    . HERNIA REPAIR    . KNEE SURGERY    . ROTATOR CUFF REPAIR    . TONSILLECTOMY      There were no vitals filed for this visit.  Subjective Assessment - 05/01/17 0930    Subjective  Patient doing very well today, no complaints of pain and stating she has noticed significant improvements in pain and mobility. Patient reporting ability to lie on right side, tolerate more walking, and sit without pain which she was unable to do several weeks ago.    Currently in Pain?  No/denies    Pain Score  0-No pain                      [facility] Adult PT Treatment/Exercise - 05/01/17 1114      Lumbar Exercises: Stretches   Passive Hamstring Stretch  3 reps;30 seconds    Passive Hamstring Stretch Limitations  with strap      Lumbar Exercises: Aerobic   Stationary Bike  L3 x 6 min       Lumbar Exercises: Standing   Functional Squats  15 reps TRX    Row  Both;15 reps;Theraband sitting on green ball    Theraband Level (Row)  Level 2 (Red)    Other Standing Lumbar Exercises  runner stretch for L calf; 2 reps; 30 sec due to patient cramping      Lumbar Exercises: Supine   Bridge  15 reps    Bridge Limitations  red tband at knees; abduction at top of bridge    Other Supine Lumbar Exercises  horizontal abduction with red tband; on 1/2 foam roll; 15 reps      Lumbar Exercises: Quadruped   Straight Leg Raise  10 reps    Opposite Arm/Leg Raise  10 reps               PT Short Term Goals - 04/07/17 0916      PT SHORT TERM GOAL #1   Title  patient to be independent with initial HEP    Status  On-going        PT Long Term Goals - 04/07/17 1194  PT LONG TERM GOAL #1   Title  patient to be independwnt with advanced HEP    Status  On-going      PT LONG TERM GOAL #2   Title  Patient to demosntrate lumbar AROM to [facility] and symmetrical without pain provocation for improved functional mobility    Status  On-going      PT LONG TERM GOAL #3   Title  Patient to report pain decrease at R back/buttock by 50-75%    Status  On-going      PT LONG TERM GOAL #4   Title  Patient to improve B LE strength to >/= 4+/5    Status  On-going      PT LONG TERM GOAL #5   Title  Patient to report ability to sit for polonged times as well as perform ADLs and household tasks wihtout pain limiting    Status  On-going            Plan - 05/01/17 1119    Clinical Impression Statement  Patient continuing to report reduced pain and increased activity levels at home. Incorporated some mid-back strengthening today to address posture limitations. Patient had persisted LE cramping today which limited performance of some LE strengthening activities. Progressed patient HEP to include all activities performed today.     PT Treatment/Interventions  ADLs/Self Care Home  Management;Cryotherapy;Electrical Stimulation;Iontophoresis 4mg /ml Dexamethasone;Moist Heat;Ultrasound;Traction;Neuromuscular re-education;Therapeutic exercise;Therapeutic activities;Functional mobility training;Patient/family education;Stair training;Manual techniques;Passive range of motion;Vasopneumatic Device;Taping;Dry needling       Patient will benefit from skilled therapeutic intervention in order to improve the following deficits and impairments:  Decreased activity tolerance, Decreased mobility, Difficulty walking, Decreased strength, Pain  Visit Diagnosis: Chronic right-sided low back pain, with sciatica presence unspecified  Pain in right hip  Other symptoms and signs involving the musculoskeletal system  Pain of right lower extremity  Stiffness due to immobility  Abnormal posture  Decreased strength, endurance, and mobility     Problem List Patient Active Problem List   Diagnosis Date Noted  . Murmur, cardiac 04/15/2017  . Asymptomatic postmenopausal estrogen deficiency 04/15/2017  . Right-sided low back pain without sciatica 02/10/2017  . Dark stools 12/23/2016  . Arthralgia 12/23/2016  . Grief at loss of child 07/29/2016  . Acute upper respiratory infection 02/27/2016  . Allergic rhinitis 02/27/2016  . Concussion 02/05/2016  . Breast pain, right 02/05/2016  . Rash and nonspecific skin eruption 11/13/2015  . Muscle cramping 10/12/2015  . Lightheadedness 08/21/2015  . GERD (gastroesophageal reflux disease) 08/06/2015  . Baker's cyst, ruptured 08/03/2015  . Hip pain 06/29/2015  . Type 2 diabetes mellitus (HCC) 06/29/2015  . Arthritis of left knee 06/15/2015  . Left knee pain 06/09/2015  . Pain of left side of body 02/22/2015  . Benign essential HTN 02/18/2015  . Cellulitis 02/18/2015  . Sciatica 01/17/2015  . Routine general medical examination at a health care facility 12/26/2014  . Medicare annual wellness visit, subsequent 12/26/2014     02/26/2015, SPT 05/01/17 11:22 AM    [facility] Outpatient Rehabilitation [facility] [address]  [address] [place], [place], [place] Phone: [phone]   Fax:  [phone]  Name: Meredith Phillips MRN: Rebekah Chesterfield Date of Birth: 04-23-1946

## 2017-05-04 ENCOUNTER — Encounter: Payer: Self-pay | Admitting: Physical Therapy

## 2017-05-04 ENCOUNTER — Ambulatory Visit: Payer: PPO | Admitting: Physical Therapy

## 2017-05-04 DIAGNOSIS — Z7409 Other reduced mobility: Secondary | ICD-10-CM

## 2017-05-04 DIAGNOSIS — R531 Weakness: Secondary | ICD-10-CM

## 2017-05-04 DIAGNOSIS — R6889 Other general symptoms and signs: Secondary | ICD-10-CM

## 2017-05-04 DIAGNOSIS — M25551 Pain in right hip: Secondary | ICD-10-CM

## 2017-05-04 DIAGNOSIS — R293 Abnormal posture: Secondary | ICD-10-CM

## 2017-05-04 DIAGNOSIS — M545 Low back pain: Secondary | ICD-10-CM | POA: Diagnosis not present

## 2017-05-04 DIAGNOSIS — M2560 Stiffness of unspecified joint, not elsewhere classified: Secondary | ICD-10-CM

## 2017-05-04 DIAGNOSIS — M79604 Pain in right leg: Secondary | ICD-10-CM

## 2017-05-04 DIAGNOSIS — R29898 Other symptoms and signs involving the musculoskeletal system: Secondary | ICD-10-CM

## 2017-05-04 DIAGNOSIS — G8929 Other chronic pain: Secondary | ICD-10-CM

## 2017-05-04 NOTE — Therapy (Signed)
[facility] Outpatient Rehabilitation [facility] [address]  [address] [place], [place], [address] Phone: [phone]   Fax:  [phone]  Physical Therapy Treatment  Patient Details  Name: Meredith Phillips MRN: [id number] Date of Birth: [birth year] Referring Provider: Jeanine Luz, FNP   Encounter Date: 05/04/2017  PT End of Session - 05/04/17 1021    Visit Number  8    Number of Visits  12    Date for PT Re-Evaluation  05/11/17    Authorization Type  [employer]    PT Start Time  0928    PT Stop Time  1016    PT Time Calculation (min)  48 min    Activity Tolerance  Patient tolerated treatment well    Behavior During Therapy  [facility] for tasks assessed/performed       Past Medical History:  Diagnosis Date  . Arthritis   . Benign essential HTN 02/18/2015  . Cellulitis 02/18/2015  . Chicken pox   . Diabetes mellitus without complication (HCC)   . Diabetic peripheral neuropathy (HCC)   . GERD (gastroesophageal reflux disease)   . Hypercholesteremia   . Hypertension   . Liver disease   . Polyp of colon     Past Surgical History:  Procedure Laterality Date  . APPENDECTOMY    . HERNIA REPAIR    . KNEE SURGERY    . ROTATOR CUFF REPAIR    . TONSILLECTOMY      There were no vitals filed for this visit.  Subjective Assessment - 05/04/17 0929    Subjective  Patient reporting did a lot of stretching over the weekend and feels a little sore today, but no complaints of pain the back/hip.     Currently in Pain?  No/denies    Pain Score  0-No pain                      [facility] Adult PT Treatment/Exercise - 05/04/17 0932      Exercises   Exercises  Knee/Hip      Lumbar Exercises: Aerobic   Stationary Bike  L3 x 6 min      Lumbar Exercises: Standing   Functional Squats  15 reps TRX      Lumbar Exercises: Seated   Other Seated Lumbar Exercises  cybex row; 15#; 15 reps cueing for posture      Lumbar Exercises: Prone   Other Prone Lumbar  Exercises  I, T, Y on green ball; 1# each UE; 15 reps each      Knee/Hip Exercises: Standing   Forward Lunges  Right;Left;15 reps    Forward Lunges Limitations  UE support at counter    Step Down  Right;Left;10 reps;Hand Hold: 2;Step Height: 4"    Other Standing Knee Exercises  monster walk; red tband; forward/backward; 2 x 69ft each    Other Standing Knee Exercises  side stepping; red tband; 5ft each way               PT Short Term Goals - 04/07/17 0916      PT SHORT TERM GOAL #1   Title  patient to be independent with initial HEP    Status  On-going        PT Long Term Goals - 04/07/17 0916      PT LONG TERM GOAL #1   Title  patient to be independwnt with advanced HEP    Status  On-going  PT LONG TERM GOAL #2   Title  Patient to demosntrate lumbar AROM to [facility] and symmetrical without pain provocation for improved functional mobility    Status  On-going      PT LONG TERM GOAL #3   Title  Patient to report pain decrease at R back/buttock by 50-75%    Status  On-going      PT LONG TERM GOAL #4   Title  Patient to improve B LE strength to >/= 4+/5    Status  On-going      PT LONG TERM GOAL #5   Title  Patient to report ability to sit for polonged times as well as perform ADLs and household tasks wihtout pain limiting    Status  On-going            Plan - 05/04/17 1022    Clinical Impression Statement  Patient continuing to work on LE strengthening as well as mid-back strengthening. B hip stregthening progressing well with back/hip pain consistently staying at 0-1/10 according to patient. Will continue to address patient postural deficits and continue with strengthening as tolerated.     PT Treatment/Interventions  ADLs/Self Care Home Management;Cryotherapy;Electrical Stimulation;Iontophoresis 4mg /ml Dexamethasone;Moist Heat;Ultrasound;Traction;Neuromuscular re-education;Therapeutic exercise;Therapeutic activities;Functional mobility training;Patient/family  education;Stair training;Manual techniques;Passive range of motion;Vasopneumatic Device;Taping;Dry needling       Patient will benefit from skilled therapeutic intervention in order to improve the following deficits and impairments:  Decreased activity tolerance, Decreased mobility, Difficulty walking, Decreased strength, Pain  Visit Diagnosis: Chronic right-sided low back pain, with sciatica presence unspecified  Pain in right hip  Other symptoms and signs involving the musculoskeletal system  Pain of right lower extremity  Stiffness due to immobility  Abnormal posture  Decreased strength, endurance, and mobility     Problem List Patient Active Problem List   Diagnosis Date Noted  . Murmur, cardiac 04/15/2017  . Asymptomatic postmenopausal estrogen deficiency 04/15/2017  . Right-sided low back pain without sciatica 02/10/2017  . Dark stools 12/23/2016  . Arthralgia 12/23/2016  . Grief at loss of child 07/29/2016  . Acute upper respiratory infection 02/27/2016  . Allergic rhinitis 02/27/2016  . Concussion 02/05/2016  . Breast pain, right 02/05/2016  . Rash and nonspecific skin eruption 11/13/2015  . Muscle cramping 10/12/2015  . Lightheadedness 08/21/2015  . GERD (gastroesophageal reflux disease) 08/06/2015  . Baker's cyst, ruptured 08/03/2015  . Hip pain 06/29/2015  . Type 2 diabetes mellitus (HCC) 06/29/2015  . Arthritis of left knee 06/15/2015  . Left knee pain 06/09/2015  . Pain of left side of body 02/22/2015  . Benign essential HTN 02/18/2015  . Cellulitis 02/18/2015  . Sciatica 01/17/2015  . Routine general medical examination at a health care facility 12/26/2014  . Medicare annual wellness visit, subsequent 12/26/2014     02/26/2015, SPT 05/04/17 10:27 AM    [facility] [address]  [address] [place], [place], [place] Phone: [phone]   Fax:  [phone]  Name: Meredith Phillips MRN: Rebekah Chesterfield Date of Birth: 03/14/1946

## 2017-05-06 ENCOUNTER — Ambulatory Visit: Payer: PPO | Admitting: Physical Therapy

## 2017-05-06 ENCOUNTER — Encounter: Payer: Self-pay | Admitting: Physical Therapy

## 2017-05-06 DIAGNOSIS — M2560 Stiffness of unspecified joint, not elsewhere classified: Secondary | ICD-10-CM

## 2017-05-06 DIAGNOSIS — Z7409 Other reduced mobility: Secondary | ICD-10-CM

## 2017-05-06 DIAGNOSIS — G8929 Other chronic pain: Secondary | ICD-10-CM

## 2017-05-06 DIAGNOSIS — R293 Abnormal posture: Secondary | ICD-10-CM

## 2017-05-06 DIAGNOSIS — R531 Weakness: Secondary | ICD-10-CM

## 2017-05-06 DIAGNOSIS — R6889 Other general symptoms and signs: Secondary | ICD-10-CM

## 2017-05-06 DIAGNOSIS — M545 Low back pain: Secondary | ICD-10-CM | POA: Diagnosis not present

## 2017-05-06 DIAGNOSIS — M25551 Pain in right hip: Secondary | ICD-10-CM

## 2017-05-06 DIAGNOSIS — M79604 Pain in right leg: Secondary | ICD-10-CM

## 2017-05-06 DIAGNOSIS — R29898 Other symptoms and signs involving the musculoskeletal system: Secondary | ICD-10-CM

## 2017-05-06 NOTE — Patient Instructions (Signed)
Side Step Abduction With Exercise Band    Toes pointed forward. Side step _20__ feet to each side. Do _2__ times per session.   Bird Dog Pose - Intermediate    Keep pelvis stable. Reach one leg back and opposite arm forward.  Hold for __3__ seconds. Return arm and leg at same rate. Repeat __10__ times, alternating sides.          Band around ankles. Knees slightly bent. Take big steps forward keeping tension on band. Perform 2 sets of 20 feet.

## 2017-05-06 NOTE — Therapy (Addendum)
[facility] [address]  [place], [place], [address] Phone: [phone]   Fax:  [phone]  Physical Therapy Treatment  Patient Details  Name: Meredith Phillips MRN: [id number] Date of Birth: 23-Oct-1945 Referring Provider: Mauricio Po, FNP   Encounter Date: 05/06/2017  PT End of Session - 05/06/17 1025    Visit Number  9    Number of Visits  12    Date for PT Re-Evaluation  05/11/17    Authorization Type  [employer]    PT Start Time  0931    PT Stop Time  1018    PT Time Calculation (min)  47 min    Activity Tolerance  Patient tolerated treatment well    Behavior During Therapy  [facility] for tasks assessed/performed       Past Medical History:  Diagnosis Date  . Arthritis   . Benign essential HTN 02/18/2015  . Cellulitis 02/18/2015  . Chicken pox   . Diabetes mellitus without complication ([place])   . Diabetic peripheral neuropathy ([place])   . GERD (gastroesophageal reflux disease)   . Hypercholesteremia   . Hypertension   . Liver disease   . Polyp of colon     Past Surgical History:  Procedure Laterality Date  . APPENDECTOMY    . HERNIA REPAIR    . KNEE SURGERY    . ROTATOR CUFF REPAIR    . TONSILLECTOMY      There were no vitals filed for this visit.  Subjective Assessment - 05/06/17 0931    Subjective  Patient feeling well today with no reports of back/hip pain. Reporting good compliance with HEP and able to walk around [employer] for appox. 1 hour without pain    Currently in Pain?  No/denies    Pain Score  0-No pain                      [facility] Adult PT Treatment/Exercise - 05/06/17 0935      Lumbar Exercises: Machines for Strengthening   Cybex Knee Extension  B concentric, R/L eccentric; 15#; 10 reps each    Cybex Knee Flexion  B concentric, R/L eccentric; 10#; 10 reps each      Lumbar Exercises: Seated   Other Seated Lumbar Exercises  alt. opposite UE/LE flexion on ball; 15 reps  each side cues for upright posture      Lumbar Exercises: Quadruped   Opposite Arm/Leg Raise  15 reps      Knee/Hip Exercises: Aerobic   Recumbent Bike  L3 x 43mn      Knee/Hip Exercises: Standing   Forward Lunges  Right;Left;15 reps R side limited due to knee pain    Forward Lunges Limitations  UE support at counter    Step Down  Right;Left;15 reps;Hand Hold: 2;Step Height: 6" cues for upright posture               PT Short Term Goals - 04/07/17 0916      PT SHORT TERM GOAL #1   Title  patient to be independent with initial HEP    Status  On-going        PT Long Term Goals - 04/07/17 0916      PT LONG TERM GOAL #1   Title  patient to be independwnt with advanced HEP    Status  On-going      PT LONG TERM GOAL #2   Title  Patient to demosntrate  lumbar AROM to [facility] and symmetrical without pain provocation for improved functional mobility    Status  On-going      PT LONG TERM GOAL #3   Title  Patient to report pain decrease at R back/buttock by 50-75%    Status  On-going      PT LONG TERM GOAL #4   Title  Patient to improve B LE strength to >/= 4+/5    Status  On-going      PT LONG TERM GOAL #5   Title  Patient to report ability to sit for polonged times as well as perform ADLs and household tasks wihtout pain limiting    Status  On-going            Plan - 05/06/17 1026    Clinical Impression Statement  Updated patient HEP with more LE strengthening exercises as well as core work. Patient tolerating all strengthening exercises well, however occassionaly limited due to R knee pain. Patient continues to require cueing to maintain upright posture during seated and standing exercises.  Will reassess patient progress towards goals at next visit.    PT Treatment/Interventions  ADLs/Self Care Home Management;Cryotherapy;Electrical Stimulation;Iontophoresis 50m/ml Dexamethasone;Moist Heat;Ultrasound;Traction;Neuromuscular re-education;Therapeutic  exercise;Therapeutic activities;Functional mobility training;Patient/family education;Stair training;Manual techniques;Passive range of motion;Vasopneumatic Device;Taping;Dry needling    Consulted and Agree with Plan of Care  Patient       Patient will benefit from skilled therapeutic intervention in order to improve the following deficits and impairments:  Decreased activity tolerance, Decreased mobility, Difficulty walking, Decreased strength, Pain  Visit Diagnosis: Chronic right-sided low back pain, with sciatica presence unspecified  Pain in right hip  Other symptoms and signs involving the musculoskeletal system  Pain of right lower extremity  Stiffness due to immobility  Abnormal posture  Decreased strength, endurance, and mobility     Problem List Patient Active Problem List   Diagnosis Date Noted  . Murmur, cardiac 04/15/2017  . Asymptomatic postmenopausal estrogen deficiency 04/15/2017  . Right-sided low back pain without sciatica 02/10/2017  . Dark stools 12/23/2016  . Arthralgia 12/23/2016  . Grief at loss of child 07/29/2016  . Acute upper respiratory infection 02/27/2016  . Allergic rhinitis 02/27/2016  . Concussion 02/05/2016  . Breast pain, right 02/05/2016  . Rash and nonspecific skin eruption 11/13/2015  . Muscle cramping 10/12/2015  . Lightheadedness 08/21/2015  . GERD (gastroesophageal reflux disease) 08/06/2015  . Baker's cyst, ruptured 08/03/2015  . Hip pain 06/29/2015  . [place] ([place] 06/29/2015  . Arthritis of left knee 06/15/2015  . Left knee pain 06/09/2015  . Pain of left side of body 02/22/2015  . Benign essential HTN 02/18/2015  . Cellulitis 02/18/2015  . Sciatica 01/17/2015  . Routine general medical examination at a health care facility 12/26/2014  . Medicare annual wellness visit, subsequent 12/26/2014     KMickle Asper SPT 05/06/17 10:30 AM   PHYSICAL THERAPY DISCHARGE SUMMARY  Visits from Start of  Care: 9  Current functional level related to goals / functional outcomes: See above   Remaining deficits: See above   Education / Equipment: HEP  Plan: Patient agrees to discharge.  Patient goals were partially met. Patient is being discharged due to not returning since the last visit.  ?????     SLanney Gins PT, DPT 08/25/17 8:56 AM  [facility] [place] [address]: [phone]  Fax:  [phone] Name: BAsuncion Tapscott  [place] MRN: [id number] Date of Birth: Aug 27, 1945

## 2017-05-12 ENCOUNTER — Other Ambulatory Visit: Payer: Self-pay | Admitting: *Deleted

## 2017-05-12 ENCOUNTER — Ambulatory Visit: Payer: PPO | Admitting: Physical Therapy

## 2017-05-12 MED ORDER — GLUCOSE BLOOD VI STRP: ORAL_STRIP | 0 refills | Status: DC

## 2017-05-12 MED ORDER — GABAPENTIN 300 MG PO CAPS: ORAL_CAPSULE | ORAL | 0 refills | Status: DC

## 2017-05-15 ENCOUNTER — Ambulatory Visit: Payer: PPO | Admitting: Physical Therapy

## 2017-05-18 ENCOUNTER — Other Ambulatory Visit: Payer: Self-pay

## 2017-05-18 ENCOUNTER — Ambulatory Visit: Payer: Self-pay

## 2017-05-18 ENCOUNTER — Emergency Department (HOSPITAL_BASED_OUTPATIENT_CLINIC_OR_DEPARTMENT_OTHER): Payer: PPO

## 2017-05-18 ENCOUNTER — Encounter (HOSPITAL_BASED_OUTPATIENT_CLINIC_OR_DEPARTMENT_OTHER): Payer: Self-pay | Admitting: Emergency Medicine

## 2017-05-18 ENCOUNTER — Emergency Department (HOSPITAL_BASED_OUTPATIENT_CLINIC_OR_DEPARTMENT_OTHER)
Admission: EM | Admit: 2017-05-18 | Discharge: 2017-05-18 | Disposition: A | Discharge status: Home / Self Care | Payer: PPO | Attending: Emergency Medicine | Admitting: Emergency Medicine

## 2017-05-18 DIAGNOSIS — R0981 Nasal congestion: Secondary | ICD-10-CM | POA: Diagnosis not present

## 2017-05-18 DIAGNOSIS — R1110 Vomiting, unspecified: Secondary | ICD-10-CM | POA: Insufficient documentation

## 2017-05-18 DIAGNOSIS — R6889 Other general symptoms and signs: Secondary | ICD-10-CM | POA: Diagnosis not present

## 2017-05-18 DIAGNOSIS — I10 Essential (primary) hypertension: Secondary | ICD-10-CM | POA: Insufficient documentation

## 2017-05-18 DIAGNOSIS — R197 Diarrhea, unspecified: Secondary | ICD-10-CM | POA: Insufficient documentation

## 2017-05-18 DIAGNOSIS — R509 Fever, unspecified: Secondary | ICD-10-CM | POA: Diagnosis not present

## 2017-05-18 DIAGNOSIS — Z79899 Other long term (current) drug therapy: Secondary | ICD-10-CM | POA: Insufficient documentation

## 2017-05-18 DIAGNOSIS — Z7984 Long term (current) use of oral hypoglycemic drugs: Secondary | ICD-10-CM | POA: Diagnosis not present

## 2017-05-18 DIAGNOSIS — R350 Frequency of micturition: Secondary | ICD-10-CM | POA: Insufficient documentation

## 2017-05-18 DIAGNOSIS — E1140 Type 2 diabetes mellitus with diabetic neuropathy, unspecified: Secondary | ICD-10-CM | POA: Diagnosis not present

## 2017-05-18 DIAGNOSIS — J111 Influenza due to unidentified influenza virus with other respiratory manifestations: Secondary | ICD-10-CM

## 2017-05-18 DIAGNOSIS — R05 Cough: Secondary | ICD-10-CM | POA: Diagnosis not present

## 2017-05-18 DIAGNOSIS — R5383 Other fatigue: Secondary | ICD-10-CM | POA: Diagnosis not present

## 2017-05-18 DIAGNOSIS — R69 Illness, unspecified: Secondary | ICD-10-CM | POA: Insufficient documentation

## 2017-05-18 LAB — CBC WITH DIFFERENTIAL/PLATELET
BASOS ABS: 0 10*3/uL (ref 0.0–0.1)
BASOS PCT: 0 %
Eosinophils Absolute: 0 10*3/uL (ref 0.0–0.7)
Eosinophils Relative: 0 %
HEMATOCRIT: 36.1 % (ref 36.0–46.0)
HEMOGLOBIN: 12 g/dL (ref 12.0–15.0)
Lymphocytes Relative: 21 %
Lymphs Abs: 1.6 10*3/uL (ref 0.7–4.0)
MCH: 28.8 pg (ref 26.0–34.0)
MCHC: 33.2 g/dL (ref 30.0–36.0)
MCV: 86.6 fL (ref 78.0–100.0)
MONOS PCT: 9 %
Monocytes Absolute: 0.7 10*3/uL (ref 0.1–1.0)
NEUTROS ABS: 5.4 10*3/uL (ref 1.7–7.7)
NEUTROS PCT: 70 %
Platelets: 310 10*3/uL (ref 150–400)
RBC: 4.17 MIL/uL (ref 3.87–5.11)
RDW: 12.3 % (ref 11.5–15.5)
WBC: 7.7 10*3/uL (ref 4.0–10.5)

## 2017-05-18 LAB — URINALYSIS, ROUTINE W REFLEX MICROSCOPIC
BILIRUBIN URINE: NEGATIVE
GLUCOSE, UA: NEGATIVE mg/dL
HGB URINE DIPSTICK: NEGATIVE
KETONES UR: NEGATIVE mg/dL
Nitrite: NEGATIVE
Specific Gravity, Urine: 1.015 (ref 1.005–1.030)
pH: 6.5 (ref 5.0–8.0)

## 2017-05-18 LAB — COMPREHENSIVE METABOLIC PANEL
ALBUMIN: 3.8 g/dL (ref 3.5–5.0)
ALK PHOS: 61 U/L (ref 38–126)
ALT: 11 U/L — ABNORMAL LOW (ref 14–54)
AST: 19 U/L (ref 15–41)
Anion gap: 8 (ref 5–15)
BILIRUBIN TOTAL: 0.6 mg/dL (ref 0.3–1.2)
BUN: 8 mg/dL (ref 6–20)
CALCIUM: 9.7 mg/dL (ref 8.9–10.3)
CO2: 24 mmol/L (ref 22–32)
Chloride: 101 mmol/L (ref 101–111)
Creatinine, Ser: 0.69 mg/dL (ref 0.44–1.00)
GFR calc Af Amer: >60 mL/min (ref >60)
GLUCOSE: 124 mg/dL — AB (ref 65–99)
Potassium: 3.2 mmol/L — ABNORMAL LOW (ref 3.5–5.1)
Sodium: 133 mmol/L — ABNORMAL LOW (ref 135–145)
TOTAL PROTEIN: 7.7 g/dL (ref 6.5–8.1)

## 2017-05-18 LAB — URINALYSIS, MICROSCOPIC (REFLEX): RBC / HPF: NONE SEEN RBC/hpf (ref 0–5)

## 2017-05-18 MED ORDER — POTASSIUM CHLORIDE CRYS ER 20 MEQ PO TBCR
40.0000 meq | EXTENDED_RELEASE_TABLET | Freq: Once | ORAL | Status: AC
  Administered 2017-05-18: 40 meq via ORAL
  Filled 2017-05-18: qty 2

## 2017-05-18 MED ORDER — OSELTAMIVIR PHOSPHATE 75 MG PO CAPS: 75.0000 mg | ORAL_CAPSULE | Freq: Two times a day (BID) | ORAL | 0 refills | Status: DC

## 2017-05-18 MED ORDER — ACETAMINOPHEN 500 MG PO TABS
1000.0000 mg | ORAL_TABLET | Freq: Once | ORAL | Status: AC
  Administered 2017-05-18: 1000 mg via ORAL
  Filled 2017-05-18: qty 2

## 2017-05-18 MED ORDER — ONDANSETRON HCL 4 MG/2ML IJ SOLN
4.0000 mg | Freq: Once | INTRAMUSCULAR | Status: AC
  Administered 2017-05-18: 4 mg via INTRAVENOUS
  Filled 2017-05-18: qty 2

## 2017-05-18 MED ORDER — ONDANSETRON HCL 4 MG PO TABS: 4.0000 mg | ORAL_TABLET | Freq: Four times a day (QID) | ORAL | 0 refills | Status: DC | PRN

## 2017-05-18 MED ORDER — SODIUM CHLORIDE 0.9 % IV BOLUS (SEPSIS)
1000.0000 mL | Freq: Once | INTRAVENOUS | Status: AC
  Administered 2017-05-18: 1000 mL via INTRAVENOUS

## 2017-05-18 MED FILL — ONDANSETRON HCL 4 MG TABLET: 4 | 4 days supply | Qty: 12 | Fill #0

## 2017-05-18 MED FILL — OSELTAMIVIR PHOSPHATE 75 MG: 75 | 5 days supply | Qty: 10 | Fill #0

## 2017-05-18 NOTE — Discharge Instructions (Signed)
Make sure you are drinking plenty of fluids and getting plenty of rest.  There is no evidence of pneumonia on her chest x-ray.  You likely have the flu.  He can take Tamiflu to try to decrease some of her symptoms.  Please take nausea medications as prescribed.  Return without fail for worsening symptoms, including confusion, passing out, difficulty breathing, extreme fatigue, intractable vomiting, concern for dehydration, or any other symptoms concerning to you.

## 2017-05-18 NOTE — Telephone Encounter (Signed)
Pt states she has been sick since Saturday. She is having shortness of breath, dizziness, nausea and vomiting and fatigue. She reports "sweating like she had just come out of the shower stating her hair was wet with sweat". Asked pt to take temp and it was 99.4 oral. Other symptoms include: nasal and head pressure, productive cough (white secretions), sore throat, diarrhea. Pt is states she "feels horrible" and was up since 0200 vomiting and diarrhea. Per protocol, advised pt to go to nearest ED [facility] ED per pt). Advised to walk with assistance and her daughter is there to drive her.  Reason for Disposition . Patient sounds very sick or weak to the triager  Answer Assessment - Initial Assessment Questions 1. ONSET: "When did the nasal discharge start?"      Saturday 2. AMOUNT: "How much discharge is there?"       Pt states he "it keeps running" 3. COUGH: "Do you have a cough?" If yes, ask: "Describe the color of your sputum" (clear, white, yellow, green)     Yes Coughing up white 4. RESPIRATORY DISTRESS: "Describe your breathing."      Shortness of breath 5. FEVER: "Do you have a fever?" If so, ask: "What is your temperature, how was it measured, and when did it start?"     States she has been sweating "like she has been taking a shower" just fever is 99.4 6. SEVERITY: "Overall, how bad are you feeling right now?" (e.g., doesn't interfere with normal activities, staying home from school/work, staying in bed)      "like I could lay down and go to sleep 7. OTHER SYMPTOMS: "Do you have any other symptoms?" (e.g., sore throat, earache, wheezing, vomiting)     Chest hurting, sore throat, earache, wheezing vomiting, diarrhea  8. PREGNANCY: "Is there any chance you are pregnant?" "When was your last menstrual period?"     n/a  Protocols used: COMMON COLD-A-AH

## 2017-05-18 NOTE — ED Provider Notes (Signed)
[place] EMERGENCY DEPARTMENT Provider Note   CSN: [id number] Arrival date & time: 05/18/17  0946     History   Chief Complaint Chief Complaint  Patient presents with  . Cough    HPI Meredith Phillips is a 71 y.o. female.  HPI 71 year old female who presents with cough, congestion, vomiting, diarrhea with fever.  Symptoms have been ongoing for the past 4 days.  She has a history of hypertension, diabetes, osteoarthritis.  Her husband and daughter were sick with upper respiratory symptoms prior to her.  She endorses subjective fevers, chills.  Has had 2 episodes of nonbloody nonbilious emesis and 2 episodes of diarrhea that is nonbloody over the past day.  She has cough productive of clear sputum.  Has had urinary frequency but denies any dysuria.  She tried to talk to her primary care doctor over the phone regarding her symptoms but was sent to the ED for evaluation.  No alleviating or aggravating symptoms.  Has not tried any treatments for her symptoms. Past Medical History:  Diagnosis Date  . Arthritis   . Benign essential HTN 02/18/2015  . Cellulitis 02/18/2015  . Chicken pox   . Diabetes mellitus without complication ([place])   . Diabetic peripheral neuropathy ([place])   . GERD (gastroesophageal reflux disease)   . Hypercholesteremia   . Hypertension   . Liver disease   . Polyp of colon     Patient Active Problem List   Diagnosis Date Noted  . Murmur, cardiac 04/15/2017  . Asymptomatic postmenopausal estrogen deficiency 04/15/2017  . Right-sided low back pain without sciatica 02/10/2017  . Dark stools 12/23/2016  . Arthralgia 12/23/2016  . Grief at loss of child 07/29/2016  . Acute upper respiratory infection 02/27/2016  . Allergic rhinitis 02/27/2016  . Concussion 02/05/2016  . Breast pain, right 02/05/2016  . Rash and nonspecific skin eruption 11/13/2015  . Muscle cramping 10/12/2015  . Lightheadedness 08/21/2015  . GERD (gastroesophageal reflux disease)  08/06/2015  . Baker's cyst, ruptured 08/03/2015  . Hip pain 06/29/2015  . Type 2 diabetes mellitus ([place]) 06/29/2015  . Arthritis of left knee 06/15/2015  . Left knee pain 06/09/2015  . Pain of left side of body 02/22/2015  . Benign essential HTN 02/18/2015  . Cellulitis 02/18/2015  . Sciatica 01/17/2015  . Routine general medical examination at a health care facility 12/26/2014  . Medicare annual wellness visit, subsequent 12/26/2014    Past Surgical History:  Procedure Laterality Date  . APPENDECTOMY    . HERNIA REPAIR    . KNEE SURGERY    . ROTATOR CUFF REPAIR    . TONSILLECTOMY      OB History    No data available       Home Medications    Prior to Admission medications   Medication Sig Start Date End Date Taking? Authorizing Provider  amLODipine (NORVASC) 10 MG tablet TAKE 1 TABLET (10 MG TOTAL) BY MOUTH DAILY. 02/13/17   Golden Circle, FNP  blood glucose meter kit and supplies KIT Dispense based on patient and insurance preference. Use up to four times daily as directed. E11.9 . 12/31/16   Golden Circle, FNP  carvedilol (COREG) 3.125 MG tablet Take 1 tablet (3.125 mg total) by mouth 2 (two) times daily with a meal. 04/17/17   Nche, Charlene Brooke, NP  Cholecalciferol (VITAMIN D-3) 1000 UNITS CAPS Take 1,000 Units by mouth.    [provider]  clobetasol cream (TEMOVATE) 3.87 % Apply 1 application  topically 2 (two) times daily. Do not use for more than two weeks at one time 11/13/15   Binnie Rail, MD  esomeprazole (NEXIUM) 20 MG capsule Take 1 capsule (20 mg total) by mouth daily at 12 noon. 04/17/17   Nche, Charlene Brooke, NP  gabapentin (NEURONTIN) 300 MG capsule TAKE 1 CAPSULE BY MOUTH THREE TIMES A DAY 05/12/17   Lance Sell, NP  glucose blood (ONE TOUCH ULTRA TEST) test strip TEST UP TO 4 TIMES DAILY 05/12/17   Lance Sell, NP  losartan (COZAAR) 100 MG tablet TAKE 1 TABLET BY MOUTH EVERY DAY 02/03/17   Golden Circle, FNP  metFORMIN  (GLUCOPHAGE-XR) 500 MG 24 hr tablet Take 1 tablet (500 mg total) by mouth daily with breakfast. Take 1 tablet by mouth 2 times daily with meals. 04/17/17   Nche, Charlene Brooke, NP  ondansetron (ZOFRAN) 4 MG tablet Take 1 tablet (4 mg total) by mouth every 6 (six) hours as needed for nausea or vomiting. 05/18/17   Forde Dandy, MD  [facility] DELICA LANCETS FINE MISC TEST UP TO 4 TIMES DAILY AS DIRECTED 02/19/17   Golden Circle, FNP  oseltamivir (TAMIFLU) 75 MG capsule Take 1 capsule (75 mg total) by mouth 2 (two) times daily. 05/18/17   Forde Dandy, MD  sertraline (ZOLOFT) 100 MG tablet Take 1 tablet (100 mg total) by mouth daily. 04/15/17   Nche, Charlene Brooke, NP  traMADol (ULTRAM) 50 MG tablet Take 1 tablet (50 mg total) by mouth every 8 (eight) hours as needed. 12/10/15   Golden Circle, FNP  triamcinolone (NASACORT AQ) 55 MCG/ACT AERO nasal inhaler Place 2 sprays into the nose daily. 02/27/16   Biagio Borg, MD    Family History Family History  Problem Relation Age of Onset  . Healthy Mother   . Healthy Father   . Healthy Paternal Grandmother   . Healthy Paternal Grandfather     Social History Social History   Tobacco Use  . Smoking status: Never Smoker  . Smokeless tobacco: Never Used  Substance Use Topics  . Alcohol use: No  . Drug use: No     Allergies   Codeine; Other; and Shellfish allergy   Review of Systems Review of Systems  Constitutional: Positive for fatigue and fever.  HENT: Positive for congestion.   Respiratory: Positive for cough.   Cardiovascular: Negative for chest pain and leg swelling.  Gastrointestinal: Positive for diarrhea, nausea and vomiting. Negative for abdominal pain.  All other systems reviewed and are negative.    Physical Exam Updated Vital Signs BP (!) 156/70   Pulse 64   Temp 100 F (37.8 C) (Oral)   Resp 15   SpO2 96%   Physical Exam Physical Exam  Nursing note and vitals reviewed. Constitutional: Non-toxic, and in no  acute distress Head: Normocephalic and atraumatic.  Mouth/Throat: Oropharynx is clear and dry mucous membranes.  Neck: Normal range of motion. Neck supple.  Cardiovascular: Normal rate and regular rhythm.   Pulmonary/Chest: Effort normal and breath sounds normal.  Abdominal: Soft. There is no tenderness. There is no rebound and no guarding.  Musculoskeletal: Normal range of motion.  Neurological: Alert, no facial droop, fluent speech, moves all extremities symmetrically Skin: Skin is warm and dry.  Psychiatric: Cooperative   ED Treatments / Results  Labs (all labs ordered are listed, but only abnormal results are displayed) Labs Reviewed  COMPREHENSIVE METABOLIC PANEL - Abnormal; Notable for the following components:  Result Value   Sodium 133 (*)    Potassium 3.2 (*)    Glucose, Bld 124 (*)    ALT 11 (*)    All other components within normal limits  URINALYSIS, ROUTINE W REFLEX MICROSCOPIC - Abnormal; Notable for the following components:   Protein, ur >300 (*)    Leukocytes, UA SMALL (*)    All other components within normal limits  URINALYSIS, MICROSCOPIC (REFLEX) - Abnormal; Notable for the following components:   Bacteria, UA FEW (*)    Squamous Epithelial / LPF 0-5 (*)    All other components within normal limits  CBC WITH DIFFERENTIAL/PLATELET    EKG  EKG Interpretation  Date/Time:  Monday May 18 2017 10:15:49 EST Ventricular Rate:  84 PR Interval:    QRS Duration: 87 QT Interval:  396 QTC Calculation: 469 R Axis:   2 Text Interpretation:  Sinus rhythm No acute changes Confirmed by Brantley Stage [id number]) on 05/18/2017 10:33:42 AM       Radiology Dg Chest 2 View  Result Date: 05/18/2017 CLINICAL DATA:  Cough EXAM: CHEST  2 VIEW COMPARISON:  None. FINDINGS: Lungs are clear.  No pleural effusion or pneumothorax. The heart is normal in size. Mild degenerative changes of the visualized thoracolumbar spine. Small hiatal hernia. IMPRESSION: No evidence of acute  cardiopulmonary disease. Electronically Signed   By: Julian Hy M.D.   On: 05/18/2017 10:54    Procedures Procedures (including critical care time)  Medications Ordered in ED Medications  acetaminophen (TYLENOL) tablet 1,000 mg (1,000 mg Oral Given 05/18/17 1034)  sodium chloride 0.9 % bolus 1,000 mL (0 mLs Intravenous Stopped 05/18/17 1213)  ondansetron (ZOFRAN) injection 4 mg (4 mg Intravenous Given 05/18/17 1034)  potassium chloride SA (K-DUR,KLOR-CON) CR tablet 40 mEq (40 mEq Oral Given 05/18/17 1142)     Initial Impression / Assessment and Plan / ED Course  I have reviewed the triage vital signs and the nursing notes.  Pertinent labs & imaging results that were available during my care of the patient were reviewed by me and considered in my medical decision making (see chart for details).     71 year old female who presents with influenza-like illness.  She is nontoxic and in no acute distress.  With low-grade temperature of 100 Fahrenheit, but hemodynamically stable and in no respiratory distress.  Chest x-ray visualized and does not show evidence of infiltrate, pneumonia or other acute cardiopulmonary processes.  Blood work overall reassuring with mild hyponatremia and hypokalemia likely related to mild dehydration due to vomiting and GI losses recently.  Given potassium repletion and IVFs, and does feel improved.  Tolerates p.o. after antiemetics.  UA not convincing for infection.  I do feel that she is stable for discharge home.  I did discuss risks and benefits of Tamiflu, which she would like to take. Strict return and follow-up instructions reviewed. She expressed understanding of all discharge instructions and felt comfortable with the plan of care.   Final Clinical Impressions(s) / ED Diagnoses   Final diagnoses:  Flu-like symptoms  Influenza-like illness    ED Discharge Orders        Ordered    oseltamivir (TAMIFLU) 75 MG capsule  2 times daily     05/18/17 1238      ondansetron (ZOFRAN) 4 MG tablet  Every 6 hours PRN     05/18/17 1238       Forde Dandy, MD 05/18/17 1241

## 2017-05-18 NOTE — ED Triage Notes (Signed)
Patient states that she started on Thursday last week. The patient states that she started to have cough last week and then N/V/D starting yesterday and when she coughs " it all comes out together" - Patient has has noted nasal congestion.

## 2017-05-19 ENCOUNTER — Other Ambulatory Visit: Payer: Self-pay | Admitting: Internal Medicine

## 2017-05-20 ENCOUNTER — Other Ambulatory Visit: Payer: Self-pay | Admitting: *Deleted

## 2017-05-20 NOTE — Patient Outreach (Signed)
[facility]) Care Management  05/20/2017  Frank Pilger [id number] 1945-11-26 [id number]    Follow up ED visit  RN spoke with pt post-op ED visit (Flu). Pt identifies confirmed and pt reports she is taking the prescribed Tamiflu and hydrating herself as recommended. Pt indicated she is feeling much better since she started the medications. No other issues as RN reviewed the plan of care and goals concerning her HTN. RN strongly encouraged pt to take her BP today 142/92 improved since discharged from the ED were it was 160/100. RN encouraged pt to contact her provider's office to inform they of her recent ED visit (pt indicated she would call). RN reminded pt of the next scheduled home visit on 12/20 with this [facility] case manager. No medication changes as pt continue to manager her care.   Elliot Cousin, RN Care Management Coordinator [facility] Main Office [phone]

## 2017-05-21 ENCOUNTER — Other Ambulatory Visit: Payer: Self-pay

## 2017-05-22 ENCOUNTER — Ambulatory Visit: Payer: PPO | Attending: Family | Admitting: Physical Therapy

## 2017-05-24 ENCOUNTER — Other Ambulatory Visit: Payer: Self-pay | Admitting: Family

## 2017-05-24 DIAGNOSIS — I10 Essential (primary) hypertension: Secondary | ICD-10-CM

## 2017-06-02 ENCOUNTER — Ambulatory Visit: Payer: PPO | Admitting: Nurse Practitioner

## 2017-06-02 ENCOUNTER — Encounter: Payer: Self-pay | Admitting: Nurse Practitioner

## 2017-06-02 VITALS — BP 168/88 | HR 68 | Temp 98.0°F | Resp 16 | Ht 63.0 in | Wt 154.0 lb

## 2017-06-02 DIAGNOSIS — I10 Essential (primary) hypertension: Secondary | ICD-10-CM

## 2017-06-02 DIAGNOSIS — R197 Diarrhea, unspecified: Secondary | ICD-10-CM | POA: Diagnosis not present

## 2017-06-02 DIAGNOSIS — R011 Cardiac murmur, unspecified: Secondary | ICD-10-CM | POA: Diagnosis not present

## 2017-06-02 NOTE — Assessment & Plan Note (Signed)
Maintained on coreg, losartan and amlodipine. She is stating that she takes her medications daily but does seem confused about the names of each medication. Her BP is elevated today but she's also having diarrhea and some recent dehydration. We will hold off on adjusting her  BP medications today, while we work on resolving the diarrhea. Shell return in about 2 weeks for follow up and I requested she bring her home medications with her for med rec at that time.

## 2017-06-02 NOTE — Assessment & Plan Note (Signed)
Heart murmur auscultated on exam today and charted in past. Occasionally lightheaded but this could also be related to recent diarrhea, dehydration, labile blood pressures. She had an EKG done in the ER on 12/4 reading NSR. - ECHOCARDIOGRAM COMPLETE; Future

## 2017-06-02 NOTE — Progress Notes (Signed)
Subjective:    Patient ID: Meredith Phillips, female    DOB: 02/13/1946, 71 y.o.   MRN: [id number]  HPI Meredith Phillips is a 71 yo female who presents today to establish care.  She is transferring to me from another provider in the same clinic. She presents today for a follow up of hypertension. She also has an acute complaint of diarrhea.  Hypertension-maintained on coreg, losartan, amlodipine. She reports daily compliance with her medications, but does seem somewhat confused about names and dosages. She reports lightheadedness, headaches. The lightheadedness is occasional. The headaches are occasional and relieved with OTC medications. She denies chest pain, cough, shortness of breath, palpations, edema.  BP Readings from Last 3 Encounters:  06/02/17 (!) 168/88  05/18/17 127/74  04/30/17 (!) 142/88   Diarrhea-  This is a new problem. This problem began about 3 weeks ago. She was seen in the ER for this problem at onset and labs were done showing mild hyponatremia, hypokalemia and ultimately diagnosed with flu. No abdominal imaging was done. The problem has not improved. She feels the urge to defecate and is unable to make it to the toilet in time. She has been incontinent of stool several times. Shes been afraid to leave the house due to the diarrhea She reports abdominal cramps. She reports she has been eating okay and trying to stay hydrated She noticed black color to her stools earlier in December that has resolved. She denies changes in her appetite, weakness, fevers, nausea, vomiting, dysuria, hematuria, urinary incontinence. No sick contacts in household.She denies recent antibiotics.  Review of Systems  See HPI  Past Medical History:  Diagnosis Date  . Arthritis   . Benign essential HTN 02/18/2015  . Cellulitis 02/18/2015  . Chicken pox   . Diabetes mellitus without complication ([place])   . Diabetic peripheral neuropathy ([place])   . GERD (gastroesophageal reflux disease)   .  Hypercholesteremia   . Hypertension   . Liver disease   . Polyp of colon      Social History   Socioeconomic History  . Marital status: Married    Spouse name: Not on file  . Number of children: 5  . Years of education: 5  . Highest education level: Not on file  Social Needs  . Financial resource strain: Not on file  . Food insecurity - worry: Not on file  . Food insecurity - inability: Not on file  . Transportation needs - medical: Not on file  . Transportation needs - non-medical: Not on file  Occupational History  . Occupation: Retired  Tobacco Use  . Smoking status: Never Smoker  . Smokeless tobacco: Never Used  Substance and Sexual Activity  . Alcohol use: No  . Drug use: No  . Sexual activity: Not on file  Other Topics Concern  . Not on file  Social History Narrative   Fun: paint, word search   Feels safe where she is living and no concern for abuse   Denies religious beliefs effecting healthcare.     Past Surgical History:  Procedure Laterality Date  . APPENDECTOMY    . HERNIA REPAIR    . KNEE SURGERY    . ROTATOR CUFF REPAIR    . TONSILLECTOMY      Family History  Problem Relation Age of Onset  . Healthy Mother   . Healthy Father   . Healthy Paternal Grandmother   . Healthy Paternal Grandfather     Allergies  Allergen Reactions  .  Codeine Other (See Comments)    Woke up the next morning and did not know where she was  . Other     Doesn't like to take pain medication due to hallucinations.  . Shellfish Allergy     Current Outpatient Medications on File Prior to Visit  Medication Sig Dispense Refill  . amLODipine (NORVASC) 10 MG tablet TAKE 1 TABLET (10 MG TOTAL) BY MOUTH DAILY. 90 tablet 1  . blood glucose meter kit and supplies KIT Dispense based on patient and insurance preference. Use up to four times daily as directed. E11.9 . 1 each 0  . carvedilol (COREG) 3.125 MG tablet Take 1 tablet (3.125 mg total) by mouth 2 (two) times daily with a  meal. 180 tablet 1  . Cholecalciferol (VITAMIN D-3) 1000 UNITS CAPS Take 1,000 Units by mouth.    . clobetasol cream (TEMOVATE) 5.49 % APPLY 1 APPLICATION 2 TIMES DAILY. DO NOT USE FOR MORE THAN TWO WEEKS AT ONE TIME 30 g 0  . esomeprazole (NEXIUM) 20 MG capsule Take 1 capsule (20 mg total) by mouth daily at 12 noon. 90 capsule 1  . gabapentin (NEURONTIN) 300 MG capsule TAKE 1 CAPSULE BY MOUTH THREE TIMES A DAY 90 capsule 0  . glucose blood (ONE TOUCH ULTRA TEST) test strip TEST UP TO 4 TIMES DAILY 120 each 0  . losartan (COZAAR) 100 MG tablet TAKE 1 TABLET BY MOUTH EVERY DAY 90 tablet 1  . metFORMIN (GLUCOPHAGE-XR) 500 MG 24 hr tablet Take 1 tablet (500 mg total) by mouth daily with breakfast. Take 1 tablet by mouth 2 times daily with meals. 180 tablet 1  . ondansetron (ZOFRAN) 4 MG tablet Take 1 tablet (4 mg total) by mouth every 6 (six) hours as needed for nausea or vomiting. 12 tablet 0  . ONETOUCH DELICA LANCETS FINE MISC TEST UP TO 4 TIMES DAILY AS DIRECTED 100 each 0  . oseltamivir (TAMIFLU) 75 MG capsule Take 1 capsule (75 mg total) by mouth 2 (two) times daily. 10 capsule 0  . sertraline (ZOLOFT) 100 MG tablet Take 1 tablet (100 mg total) by mouth daily. 90 tablet 1  . traMADol (ULTRAM) 50 MG tablet Take 1 tablet (50 mg total) by mouth every 8 (eight) hours as needed. 60 tablet 0  . triamcinolone (NASACORT AQ) 55 MCG/ACT AERO nasal inhaler Place 2 sprays into the nose daily. 1 Inhaler 12   No current facility-administered medications on file prior to visit.     BP (!) 168/88 (BP Location: Left Arm, Patient Position: Sitting, Cuff Size: Normal)   Pulse 68   Temp 98 F (36.7 C) (Oral)   Resp 16   Ht _0  (1.6 m)   Wt 154 lb (69.9 kg)   SpO2 98%   BMI 27.28 kg/m       Objective:   Physical Exam  Constitutional: She is oriented to person, place, and time. She appears well-developed and well-nourished. No distress.  HENT:  Head: Normocephalic and atraumatic.  Cardiovascular:  Normal rate, regular rhythm and intact distal pulses.  Murmur heard. Pulmonary/Chest: Effort normal and breath sounds normal.  Abdominal: Soft. Bowel sounds are normal. She exhibits no distension and no mass. There is no hepatosplenomegaly. There is tenderness in the left upper quadrant. There is no rebound.  Neurological: She is alert and oriented to person, place, and time. She has normal strength. Coordination and gait normal. GCS eye subscore is 4. GCS verbal subscore is 5. GCS motor subscore is  6.  Skin: Skin is warm and dry.  Psychiatric: She has a normal mood and affect. Judgment and thought content normal.      Assessment & Plan:   Diarrhea, unspecified type We will hold on medication to treat diarrhea for now, while we look for cause of diarrhea. - CBC; Future - Basic metabolic panel; Future - Gastrointestinal Pathogen Panel PCR; Future Return precautions given including fevers, unable to tolerate PO intake, worsening symptoms.

## 2017-06-02 NOTE — Patient Instructions (Addendum)
Please head downstairs for lab work.  I have placed an order for an ultrasound of your heart. You will be contacted to schedule this appointment in 7-10 days.  Please continue to stay hydrated.  Please follow up if you develop fevers over 101 or start to feel worse.  We will hold off on medications to reduce diarrhea at this time, so that your body can pass any infection that may be in your stool.  Id like to see you back in 2-3 weeks, so we can re-evaluate your blood pressure. Try to bring your medications with you at that visit, so we can make sure you are taking the right meds.  It was nice to meet you. Thanks for letting me take care of you today :)

## 2017-06-03 ENCOUNTER — Other Ambulatory Visit (INDEPENDENT_AMBULATORY_CARE_PROVIDER_SITE_OTHER): Payer: PPO

## 2017-06-03 DIAGNOSIS — R197 Diarrhea, unspecified: Secondary | ICD-10-CM | POA: Diagnosis not present

## 2017-06-03 LAB — BASIC METABOLIC PANEL
BUN: 9 mg/dL (ref 6–23)
CALCIUM: 9.5 mg/dL (ref 8.4–10.5)
CHLORIDE: 105 meq/L (ref 96–112)
CO2: 30 meq/L (ref 19–32)
CREATININE: 0.72 mg/dL (ref 0.40–1.20)
GFR: 101.85 mL/min (ref >60.00)
GLUCOSE: 96 mg/dL (ref 70–99)
Potassium: 3.5 mEq/L (ref 3.5–5.1)
Sodium: 142 mEq/L (ref 135–145)

## 2017-06-03 LAB — CBC
HEMATOCRIT: 36.3 % (ref 36.0–46.0)
HEMOGLOBIN: 11.7 g/dL — AB (ref 12.0–15.0)
MCHC: 32.2 g/dL (ref 30.0–36.0)
MCV: 88.6 fl (ref 78.0–100.0)
PLATELETS: 362 10*3/uL (ref 150.0–400.0)
RBC: 4.1 Mil/uL (ref 3.87–5.11)
RDW: 13.8 % (ref 11.5–15.5)
WBC: 5.5 10*3/uL (ref 4.0–10.5)

## 2017-06-04 ENCOUNTER — Other Ambulatory Visit: Payer: Self-pay | Admitting: *Deleted

## 2017-06-04 ENCOUNTER — Encounter: Payer: Self-pay | Admitting: *Deleted

## 2017-06-04 NOTE — Patient Outreach (Signed)
[facility]) Care Management   06/04/2017  Meredith Phillips [id number] [birth year] [id number]  Meredith Phillips is an 71 y.o. female  Subjective:  HTN: Pt reports she is managing her BP and discuss her treatment plan with her primary provider on possibly change in her BP medication due to her ongoing high readings at times. Pt denies any issues and continues to reduce her stressors to lower her BP. Pt aware of what to do if acute symptoms should occur. Pt feel comfortable managing her BP and receptive to a discharge today.  Pt indicates she will continue to self monitoring her BP with her home device and aware of her acute readings and what to do. ED visit: Pt states she had completed all medication for the symptoms experienced with no additional issues.   Objective:   Review of Systems  Constitutional: Negative.   HENT: Negative.   Eyes: Negative.   Respiratory: Negative.   Cardiovascular: Negative.   Gastrointestinal: Negative.   Genitourinary: Negative.   Musculoskeletal: Negative.   Skin: Negative.   Neurological: Negative.   Endo/Heme/Allergies: Negative.   Psychiatric/Behavioral: Negative.     Physical Exam  Constitutional: She is oriented to person, place, and time. She appears well-developed and well-nourished.  HENT:  Right Ear: External ear normal.  Left Ear: External ear normal.  Eyes: EOM are normal.  Neck: Normal range of motion.  Cardiovascular: Normal heart sounds.  Respiratory: Effort normal and breath sounds normal.  GI: Soft. Bowel sounds are normal.  Musculoskeletal: Normal range of motion.  Neurological: She is alert and oriented to person, place, and time.  Skin: Skin is warm and dry.  Psychiatric: She has a normal mood and affect. Her behavior is normal. Judgment and thought content normal.    Encounter Medications:   Outpatient Encounter Medications as of 06/04/2017  Medication Sig  . amLODipine (NORVASC) 10 MG tablet TAKE 1 TABLET (10 MG  TOTAL) BY MOUTH DAILY.  . blood glucose meter kit and supplies KIT Dispense based on patient and insurance preference. Use up to four times daily as directed. E11.9 .  . carvedilol (COREG) 3.125 MG tablet Take 1 tablet (3.125 mg total) by mouth 2 (two) times daily with a meal.  . Cholecalciferol (VITAMIN D-3) 1000 UNITS CAPS Take 1,000 Units by mouth.  . clobetasol cream (TEMOVATE) 7.37 % APPLY 1 APPLICATION 2 TIMES DAILY. DO NOT USE FOR MORE THAN TWO WEEKS AT ONE TIME  . esomeprazole (NEXIUM) 20 MG capsule Take 1 capsule (20 mg total) by mouth daily at 12 noon.  . gabapentin (NEURONTIN) 300 MG capsule TAKE 1 CAPSULE BY MOUTH THREE TIMES A DAY  . glucose blood (ONE TOUCH ULTRA TEST) test strip TEST UP TO 4 TIMES DAILY  . losartan (COZAAR) 100 MG tablet TAKE 1 TABLET BY MOUTH EVERY DAY  . metFORMIN (GLUCOPHAGE-XR) 500 MG 24 hr tablet Take 1 tablet (500 mg total) by mouth daily with breakfast. Take 1 tablet by mouth 2 times daily with meals.  . ondansetron (ZOFRAN) 4 MG tablet Take 1 tablet (4 mg total) by mouth every 6 (six) hours as needed for nausea or vomiting.  Meredith Phillips DELICA LANCETS FINE MISC TEST UP TO 4 TIMES DAILY AS DIRECTED  . sertraline (ZOLOFT) 100 MG tablet Take 1 tablet (100 mg total) by mouth daily.  . traMADol (ULTRAM) 50 MG tablet Take 1 tablet (50 mg total) by mouth every 8 (eight) hours as needed.  . triamcinolone (NASACORT AQ) 55 MCG/ACT AERO  nasal inhaler Place 2 sprays into the nose daily.  [name] oseltamivir (TAMIFLU) 75 MG capsule Take 1 capsule (75 mg total) by mouth 2 (two) times daily. (Patient not taking: Reported on 06/04/2017)   No facility-administered encounter medications on file as of 06/04/2017.     Functional Status:   In your present state of health, do you have any difficulty performing the following activities: 04/30/2017 12/30/2016  Hearing? N N  Vision? N N  Difficulty concentrating or making decisions? N N  Walking or climbing stairs? N N  Dressing or  bathing? N N  Doing errands, shopping? Meredith Phillips  Preparing Food and eating ? N N  Using the Toilet? N N  In the past six months, have you accidently leaked urine? N N  Do you have problems with loss of bowel control? N N  Managing your Medications? N N  Managing your Finances? N N  Housekeeping or managing your Housekeeping? N N  Some recent data might be hidden    Fall/Depression Screening:    Fall Risk  04/30/2017 12/30/2016 02/12/2016  Falls in the past year? No No No   PHQ 2/9 Scores 04/30/2017 12/30/2016 02/12/2016 12/26/2014  PHQ - 2 Score 0 0 0 0   Blood pressure (!) 142/70, resp. rate 20, height 1.6 m ('5\' 3"' ), weight 154 lb (69.9 kg).  Assessment:   Case management related to HTN Follow up ED visit   Plan:  Will verified no symptoms and pt continue to manage her HTN with no encountered issues. Will verified pt is aware of what to do if acute issues occur. Will review plan of care and goals that have been met.  Will verified pt has not had any additional symptoms after taken to prescribed medications. Will encouraged pt to follow up with her provider with any additional symptoms.  Will discharge pt based upon all goals met with no additional issues or inquiries. Will update primary provider with today's discharge and continue to encouraged adherence with managing her HTN. RN has provided a contact number if she has additional inquires or request. Will alert CMA that this case will be closed with all goals met.  Meredith Mina, RN Care Management Coordinator [place] Office [phone]

## 2017-06-10 ENCOUNTER — Other Ambulatory Visit: Payer: Self-pay | Admitting: *Deleted

## 2017-06-10 MED ORDER — GLUCOSE BLOOD VI STRP: ORAL_STRIP | 5 refills | Status: DC

## 2017-06-15 ENCOUNTER — Encounter: Payer: Self-pay | Admitting: Family

## 2017-06-15 ENCOUNTER — Ambulatory Visit: Payer: PPO | Admitting: Family

## 2017-06-15 VITALS — BP 146/92 | HR 70 | Temp 98.4°F | Ht 63.0 in

## 2017-06-15 DIAGNOSIS — I10 Essential (primary) hypertension: Secondary | ICD-10-CM | POA: Diagnosis not present

## 2017-06-15 DIAGNOSIS — Z634 Disappearance and death of family member: Secondary | ICD-10-CM

## 2017-06-15 DIAGNOSIS — K2100 Gastro-esophageal reflux disease with esophagitis, without bleeding: Secondary | ICD-10-CM

## 2017-06-15 DIAGNOSIS — F4321 Adjustment disorder with depressed mood: Secondary | ICD-10-CM

## 2017-06-15 MED ORDER — ESOMEPRAZOLE MAGNESIUM 40 MG PO CPDR: 40.0000 mg | DELAYED_RELEASE_CAPSULE | Freq: Every day | ORAL | 0 refills | Status: DC

## 2017-06-15 MED ORDER — CARVEDILOL 3.125 MG PO TABS: 3.1250 mg | ORAL_TABLET | Freq: Two times a day (BID) | ORAL | 1 refills | Status: DC

## 2017-06-15 NOTE — Progress Notes (Signed)
Meredith Phillips is a 71 y.o. female with the following history as recorded in EpicCare:  Patient Active Problem List   Diagnosis Date Noted  . Murmur, cardiac 04/15/2017  . Asymptomatic postmenopausal estrogen deficiency 04/15/2017  . Right-sided low back pain without sciatica 02/10/2017  . Dark stools 12/23/2016  . Grief at loss of child 07/29/2016  . Acute upper respiratory infection 02/27/2016  . Allergic rhinitis 02/27/2016  . Rash and nonspecific skin eruption 11/13/2015  . Lightheadedness 08/21/2015  . GERD (gastroesophageal reflux disease) 08/06/2015  . Baker's cyst, ruptured 08/03/2015  . Hip pain 06/29/2015  . Type 2 diabetes mellitus ([place]) 06/29/2015  . Arthritis of left knee 06/15/2015  . Left knee pain 06/09/2015  . Pain of left side of body 02/22/2015  . Benign essential HTN 02/18/2015  . Cellulitis 02/18/2015  . Sciatica 01/17/2015  . Routine general medical examination at a health care facility 12/26/2014  . Medicare annual wellness visit, subsequent 12/26/2014    Current Outpatient Medications  Medication Sig Dispense Refill  . amLODipine (NORVASC) 10 MG tablet TAKE 1 TABLET (10 MG TOTAL) BY MOUTH DAILY. 90 tablet 1  . blood glucose meter kit and supplies KIT Dispense based on patient and insurance preference. Use up to four times daily as directed. E11.9 . 1 each 0  . Cholecalciferol (VITAMIN D-3) 1000 UNITS CAPS Take 1,000 Units by mouth.    . clobetasol cream (TEMOVATE) 2.80 % APPLY 1 APPLICATION 2 TIMES DAILY. DO NOT USE FOR MORE THAN TWO WEEKS AT ONE TIME 30 g 0  . esomeprazole (NEXIUM) 40 MG capsule Take 1 capsule (40 mg total) by mouth daily at 12 noon. 30 capsule 0  . gabapentin (NEURONTIN) 300 MG capsule TAKE 1 CAPSULE BY MOUTH THREE TIMES A DAY 90 capsule 0  . glucose blood (ONE TOUCH ULTRA TEST) test strip TEST UP TO 4 TIMES DAILY 120 each 5  . losartan (COZAAR) 100 MG tablet TAKE 1 TABLET BY MOUTH EVERY DAY 90 tablet 1  . metFORMIN (GLUCOPHAGE-XR) 500  MG 24 hr tablet Take 1 tablet (500 mg total) by mouth daily with breakfast. Take 1 tablet by mouth 2 times daily with meals. 180 tablet 1  . ONETOUCH DELICA LANCETS FINE MISC TEST UP TO 4 TIMES DAILY AS DIRECTED 100 each 0  . sertraline (ZOLOFT) 100 MG tablet Take 1 tablet (100 mg total) by mouth daily. 90 tablet 1  . simvastatin (ZOCOR) 20 MG tablet Take 20 mg by mouth daily.  1  . carvedilol (COREG) 3.125 MG tablet Take 1 tablet (3.125 mg total) by mouth 2 (two) times daily with a meal. 180 tablet 1   No current facility-administered medications for this visit.     Allergies: Codeine; Other; and Shellfish allergy  Past Medical History:  Diagnosis Date  . Arthritis   . Benign essential HTN 02/18/2015  . Cellulitis 02/18/2015  . Chicken pox   . Diabetes mellitus without complication ([place])   . Diabetic peripheral neuropathy ([place])   . GERD (gastroesophageal reflux disease)   . Hypercholesteremia   . Hypertension   . Liver disease   . Polyp of colon     Past Surgical History:  Procedure Laterality Date  . APPENDECTOMY    . HERNIA REPAIR    . KNEE SURGERY    . ROTATOR CUFF REPAIR    . TONSILLECTOMY      Family History  Problem Relation Age of Onset  . Healthy Mother   . Healthy Father   .  Healthy Paternal Grandmother   . Healthy Paternal Grandfather     Social History   Tobacco Use  . Smoking status: Never Smoker  . Smokeless tobacco: Never Used  Substance Use Topics  . Alcohol use: No    Subjective:  2 week follow-up on hypertension; notes that she is concerned about how high her blood pressure has been recently; per her chart, patient is supposed to be taking Amlodipine 10 mg, Losartan 100 mg and Coreg 3.25 mg bid; she brings all of her medications in today for review; she does admit to occasional dizziness but denies any chest pain, shortness of breath or blurred vision.  She admits that she is occasionally very forgetful and "just feels out of it." She notes however these  feelings are on the day when she is most upset about the loss of her daughter in the past year.   Objective: Vitals:   06/15/17 0945  BP: (!) 146/92  Pulse: 70  Temp: 98.4 F (36.9 C)  TempSrc: Oral  SpO2: 100%  Height: '5\' 3"'  (1.6 m)    General: Well developed, well nourished, in no acute distress  Skin : Warm and dry.  Head: Normocephalic and atraumatic  Lungs: Respirations unlabored; clear to auscultation bilaterally without wheeze, rales, rhonchi  CVS exam: normal rate and regular rhythm.  Neurologic: Alert and oriented; speech intact; face symmetrical; moves all extremities well; CNII-XII intact without focal deficit  Assessment:  1. Benign essential HTN   2. Grief at loss of child   3. Gastroesophageal reflux disease with esophagitis     Plan:  1) Uncontrolled; patient has not been taking her Coreg bid; she only brings her Amlodipine and Losartan today; stressed to patient to take 3 blood pressure medications; may need to consider re-starting Clonidine if remains uncontrolled; 2) Patient will continue Zoloft 100 mg; encouraged her to talk to her other children about their shared grief/ continue to be a part of her church family; she agrees and notes she appreciates the support today; 3) Change made to medication list to reflect that patient is currently on Nexium 40 mg- not 20 mg as has previously been stated.   Extensive medication review done with patient today; she leaves with an up to date medication list which is marked and indicates which medication is for which condition.  Follow-up in 1 week to re-check blood pressure.   Time spent with patient today is 30 minutes- greater than 50% counseling and  medication review.   Return in about 1 week (around 06/22/2017) for with Hollie Beach or Mickel Baas.  No orders of the defined types were placed in this encounter.   Requested Prescriptions   Signed Prescriptions Disp Refills  . carvedilol (COREG) 3.125 MG tablet 180 tablet 1     Sig: Take 1 tablet (3.125 mg total) by mouth 2 (two) times daily with a meal.  . esomeprazole (NEXIUM) 40 MG capsule 30 capsule 0    Sig: Take 1 capsule (40 mg total) by mouth daily at 12 noon.

## 2017-06-18 ENCOUNTER — Other Ambulatory Visit (HOSPITAL_COMMUNITY): Payer: Self-pay

## 2017-06-18 ENCOUNTER — Other Ambulatory Visit: Payer: PPO

## 2017-06-18 DIAGNOSIS — R197 Diarrhea, unspecified: Secondary | ICD-10-CM

## 2017-06-20 ENCOUNTER — Other Ambulatory Visit: Payer: Self-pay | Admitting: Nurse Practitioner

## 2017-06-24 ENCOUNTER — Ambulatory Visit: Payer: Self-pay | Admitting: Family

## 2017-06-25 ENCOUNTER — Other Ambulatory Visit (HOSPITAL_COMMUNITY): Payer: Self-pay

## 2017-06-26 ENCOUNTER — Ambulatory Visit: Payer: PPO | Admitting: Family

## 2017-06-26 ENCOUNTER — Encounter: Payer: Self-pay | Admitting: Family

## 2017-06-26 ENCOUNTER — Ambulatory Visit: Payer: Self-pay | Admitting: Family

## 2017-06-26 ENCOUNTER — Other Ambulatory Visit (INDEPENDENT_AMBULATORY_CARE_PROVIDER_SITE_OTHER): Payer: PPO

## 2017-06-26 VITALS — BP 154/90 | HR 74 | Temp 97.8°F | Ht 63.0 in | Wt 149.0 lb

## 2017-06-26 DIAGNOSIS — I10 Essential (primary) hypertension: Secondary | ICD-10-CM

## 2017-06-26 DIAGNOSIS — E876 Hypokalemia: Secondary | ICD-10-CM | POA: Diagnosis not present

## 2017-06-26 LAB — COMPREHENSIVE METABOLIC PANEL
ALBUMIN: 3.9 g/dL (ref 3.5–5.2)
ALT: 8 U/L (ref 0–35)
AST: 12 U/L (ref 0–37)
Alkaline Phosphatase: 54 U/L (ref 39–117)
BILIRUBIN TOTAL: 0.5 mg/dL (ref 0.2–1.2)
BUN: 6 mg/dL (ref 6–23)
CALCIUM: 9.9 mg/dL (ref 8.4–10.5)
CO2: 31 meq/L (ref 19–32)
CREATININE: 0.7 mg/dL (ref 0.40–1.20)
Chloride: 101 mEq/L (ref 96–112)
GFR: 105.2 mL/min (ref >60.00)
Glucose, Bld: 98 mg/dL (ref 70–99)
Potassium: 3.1 mEq/L — ABNORMAL LOW (ref 3.5–5.1)
Sodium: 139 mEq/L (ref 135–145)
Total Protein: 7.3 g/dL (ref 6.0–8.3)

## 2017-06-26 LAB — GASTROINTESTINAL PATHOGEN PANEL PCR
C. difficile Tox A/B, PCR: UNDETERMINED — AB
CRYPTOSPORIDIUM, PCR: UNDETERMINED — AB
Campylobacter, PCR: UNDETERMINED — AB
E COLI (ETEC) LT/ST, PCR: UNDETERMINED — AB
E COLI 0157, PCR: UNDETERMINED — AB
E coli (STEC) stx1/stx2, PCR: UNDETERMINED — AB
Giardia lamblia, PCR: UNDETERMINED — AB
Norovirus, PCR: UNDETERMINED — AB
Rotavirus A, PCR: UNDETERMINED — AB
Salmonella, PCR: UNDETERMINED — AB
Shigella, PCR: UNDETERMINED — AB

## 2017-06-26 MED ORDER — CLONIDINE HCL 0.1 MG PO TABS: 0.1000 mg | ORAL_TABLET | Freq: Two times a day (BID) | ORAL | 1 refills | Status: DC

## 2017-06-26 NOTE — Patient Instructions (Signed)
Your morning blood pressure medications:  Amlodipine, Losartan and Coreg    Your evening blood pressure medicaitons:  Coreg and Clonidine

## 2017-06-27 ENCOUNTER — Other Ambulatory Visit: Payer: Self-pay | Admitting: Family

## 2017-06-27 MED ORDER — POTASSIUM CHLORIDE ER 20 MEQ PO TBCR: 20.0000 meq | EXTENDED_RELEASE_TABLET | Freq: Every day | ORAL | 0 refills | Status: DC

## 2017-06-27 NOTE — Progress Notes (Signed)
Meredith Phillips is a 72 y.o. female with the following history as recorded in EpicCare:  Patient Active Problem List   Diagnosis Date Noted  . Murmur, cardiac 04/15/2017  . Asymptomatic postmenopausal estrogen deficiency 04/15/2017  . Right-sided low back pain without sciatica 02/10/2017  . Dark stools 12/23/2016  . Grief at loss of child 07/29/2016  . Acute upper respiratory infection 02/27/2016  . Allergic rhinitis 02/27/2016  . Rash and nonspecific skin eruption 11/13/2015  . Lightheadedness 08/21/2015  . GERD (gastroesophageal reflux disease) 08/06/2015  . Baker's cyst, ruptured 08/03/2015  . Hip pain 06/29/2015  . Type 2 diabetes mellitus ([place]) 06/29/2015  . Arthritis of left knee 06/15/2015  . Left knee pain 06/09/2015  . Pain of left side of body 02/22/2015  . Benign essential HTN 02/18/2015  . Cellulitis 02/18/2015  . Sciatica 01/17/2015  . Routine general medical examination at a health care facility 12/26/2014  . Medicare annual wellness visit, subsequent 12/26/2014    Current Outpatient Medications  Medication Sig Dispense Refill  . amLODipine (NORVASC) 10 MG tablet TAKE 1 TABLET (10 MG TOTAL) BY MOUTH DAILY. 90 tablet 1  . blood glucose meter kit and supplies KIT Dispense based on patient and insurance preference. Use up to four times daily as directed. E11.9 . 1 each 0  . carvedilol (COREG) 3.125 MG tablet Take 1 tablet (3.125 mg total) by mouth 2 (two) times daily with a meal. 180 tablet 1  . Cholecalciferol (VITAMIN D-3) 1000 UNITS CAPS Take 1,000 Units by mouth.    . clobetasol cream (TEMOVATE) 9.56 % APPLY 1 APPLICATION 2 TIMES DAILY. DO NOT USE FOR MORE THAN TWO WEEKS AT ONE TIME 30 g 0  . esomeprazole (NEXIUM) 40 MG capsule Take 1 capsule (40 mg total) by mouth daily at 12 noon. 30 capsule 0  . gabapentin (NEURONTIN) 300 MG capsule TAKE 1 CAPSULE BY MOUTH THREE TIMES A DAY 90 capsule 0  . glucose blood (ONE TOUCH ULTRA TEST) test strip TEST UP TO 4 TIMES DAILY  120 each 5  . losartan (COZAAR) 100 MG tablet TAKE 1 TABLET BY MOUTH EVERY DAY 90 tablet 1  . metFORMIN (GLUCOPHAGE-XR) 500 MG 24 hr tablet Take 1 tablet (500 mg total) by mouth daily with breakfast. Take 1 tablet by mouth 2 times daily with meals. 180 tablet 1  . ONETOUCH DELICA LANCETS FINE MISC TEST UP TO 4 TIMES DAILY AS DIRECTED 100 each 0  . sertraline (ZOLOFT) 100 MG tablet Take 1 tablet (100 mg total) by mouth daily. 90 tablet 1  . simvastatin (ZOCOR) 20 MG tablet Take 20 mg by mouth daily.  1  . cloNIDine (CATAPRES) 0.1 MG tablet Take 1 tablet (0.1 mg total) by mouth 2 (two) times daily. Start with Clonidine at night as directed; increase to twice a day when directed 60 tablet 1  . potassium chloride 20 MEQ TBCR Take 20 mEq by mouth daily for 5 days. 5 tablet 0   No current facility-administered medications for this visit.     Allergies: Codeine; Other; and Shellfish allergy  Past Medical History:  Diagnosis Date  . Arthritis   . Benign essential HTN 02/18/2015  . Cellulitis 02/18/2015  . Chicken pox   . Diabetes mellitus without complication ([place])   . Diabetic peripheral neuropathy ([place])   . GERD (gastroesophageal reflux disease)   . Hypercholesteremia   . Hypertension   . Liver disease   . Polyp of colon     Past  Surgical History:  Procedure Laterality Date  . APPENDECTOMY    . HERNIA REPAIR    . KNEE SURGERY    . ROTATOR CUFF REPAIR    . TONSILLECTOMY      Family History  Problem Relation Age of Onset  . Healthy Mother   . Healthy Father   . Healthy Paternal Grandmother   . Healthy Paternal Grandfather     Social History   Tobacco Use  . Smoking status: Never Smoker  . Smokeless tobacco: Never Used  Substance Use Topics  . Alcohol use: No    Subjective:  1 week follow-up on hypertension; was seen at ER [facility] earlier this week and potassium was noted to be very low; in reviewing notes from ER, there appears to be continued confusion about patient's  medications; their notes indicate she or a family member told them she was on Clonidine/ Chlorthalidone but she is adamant that she is not taking this medication; she notes her blood pressure yesterday morning was 170/112. She is having headaches/ dizziness; Denies any chest pain, shortness of breath, blurred vision. She states she takes her Amlodipine, Losartan and Coreg in the am; then takes second Coreg at night;    Objective:  Vitals:   06/26/17 1350  BP: (!) 154/90  Pulse: 74  Temp: 97.8 F (36.6 C)  TempSrc: Oral  SpO2: 97%  Weight: 149 lb (67.6 kg)  Height: _0  (1.6 m)    General: Well developed, well nourished, in no acute distress  Skin : Warm and dry.  Head: Normocephalic and atraumatic  Lungs: Respirations unlabored; clear to auscultation bilaterally without wheeze, rales, rhonchi  CVS exam: normal rate and regular rhythm.  Neurologic: Alert and oriented; speech intact; face symmetrical; moves all extremities well; CNII-XII intact without focal deficit  Assessment:  1. Benign essential HTN   2. Hypokalemia     Plan:  1. Uncontrolled; I am still concerned about patient's understanding of her medications even with the extensive medication review done last week and again today; I personally called her husband and discussed her medications as he is the one who gets her medications from the pharmacy for her; he understands she does need to start taking Clonidine at night and continue her other 3 medications; will have her follow-up in 1-2 weeks for re-check; may need to have Clonidine bid; 2. Check CMP today; follow-up to be determined.   Time spent with patient 30 minutes involving counseling regarding medications and coordination of care;    Return in about 2 weeks (around 07/10/2017) for [place] or Laura/ blood pressure check.  Orders Placed This Encounter  Procedures  . Comp Met (CMET)    Standing Status:   Future    Number of Occurrences:   1    Standing Expiration  Date:   06/26/2018    Requested Prescriptions   Signed Prescriptions Disp Refills  . cloNIDine (CATAPRES) 0.1 MG tablet 60 tablet 1    Sig: Take 1 tablet (0.1 mg total) by mouth 2 (two) times daily. Start with Clonidine at night as directed; increase to twice a day when directed

## 2017-06-29 ENCOUNTER — Other Ambulatory Visit: Payer: Self-pay | Admitting: Nurse Practitioner

## 2017-06-29 DIAGNOSIS — R197 Diarrhea, unspecified: Secondary | ICD-10-CM

## 2017-06-29 NOTE — Progress Notes (Signed)
orders

## 2017-06-30 ENCOUNTER — Other Ambulatory Visit (HOSPITAL_COMMUNITY): Payer: Self-pay

## 2017-07-06 ENCOUNTER — Telehealth (HOSPITAL_COMMUNITY): Payer: Self-pay | Admitting: Nurse Practitioner

## 2017-07-06 NOTE — Telephone Encounter (Signed)
HTA Auth# [id number] exp 09/01/17/lb 06/02/17 LMOM on mobile phone-home phone is fax# to schedule echo evd 06/10/17 spoke with patient. Patient will call back difficulty with transportation. evd 07/06/17 Patient called in and cancelled (patient needs to cancel due to losing health insurance coverage).Marland KitchenRG

## 2017-07-08 ENCOUNTER — Other Ambulatory Visit (HOSPITAL_COMMUNITY): Payer: Self-pay

## 2017-07-10 ENCOUNTER — Ambulatory Visit (INDEPENDENT_AMBULATORY_CARE_PROVIDER_SITE_OTHER): Payer: PPO | Admitting: Family

## 2017-07-10 ENCOUNTER — Other Ambulatory Visit (INDEPENDENT_AMBULATORY_CARE_PROVIDER_SITE_OTHER): Payer: PPO

## 2017-07-10 ENCOUNTER — Encounter: Payer: Self-pay | Admitting: Family

## 2017-07-10 VITALS — BP 136/80 | HR 70 | Temp 98.3°F | Wt 149.0 lb

## 2017-07-10 DIAGNOSIS — R413 Other amnesia: Secondary | ICD-10-CM

## 2017-07-10 DIAGNOSIS — R519 Headache, unspecified: Secondary | ICD-10-CM

## 2017-07-10 DIAGNOSIS — R51 Headache: Secondary | ICD-10-CM

## 2017-07-10 DIAGNOSIS — I10 Essential (primary) hypertension: Secondary | ICD-10-CM

## 2017-07-10 DIAGNOSIS — R197 Diarrhea, unspecified: Secondary | ICD-10-CM

## 2017-07-10 DIAGNOSIS — G319 Degenerative disease of nervous system, unspecified: Secondary | ICD-10-CM | POA: Diagnosis not present

## 2017-07-10 DIAGNOSIS — E119 Type 2 diabetes mellitus without complications: Secondary | ICD-10-CM

## 2017-07-10 LAB — C-REACTIVE PROTEIN: CRP: 0.2 mg/dL — AB (ref 0.5–20.0)

## 2017-07-10 LAB — COMPREHENSIVE METABOLIC PANEL
ALT: 8 U/L (ref 0–35)
AST: 13 U/L (ref 0–37)
Albumin: 3.9 g/dL (ref 3.5–5.2)
Alkaline Phosphatase: 58 U/L (ref 39–117)
BUN: 6 mg/dL (ref 6–23)
CALCIUM: 9.8 mg/dL (ref 8.4–10.5)
CHLORIDE: 102 meq/L (ref 96–112)
CO2: 28 meq/L (ref 19–32)
CREATININE: 0.76 mg/dL (ref 0.40–1.20)
GFR: 95.66 mL/min (ref >60.00)
Glucose, Bld: 107 mg/dL — ABNORMAL HIGH (ref 70–99)
Potassium: 3.8 mEq/L (ref 3.5–5.1)
Sodium: 139 mEq/L (ref 135–145)
Total Bilirubin: 0.5 mg/dL (ref 0.2–1.2)
Total Protein: 7.4 g/dL (ref 6.0–8.3)

## 2017-07-10 LAB — HEMOGLOBIN A1C: Hgb A1c MFr Bld: 5.3 % (ref 4.6–6.5)

## 2017-07-10 LAB — SEDIMENTATION RATE: SED RATE: 30 mm/h (ref 0–30)

## 2017-07-10 NOTE — Patient Instructions (Signed)
The Metformin is only twice a day- at breakfast and dinner; taking less of this medicine could help with the diarrhea;

## 2017-07-10 NOTE — Progress Notes (Signed)
Meredith Phillips is a 72 y.o. female with the following history as recorded in EpicCare:  Patient Active Problem List   Diagnosis Date Noted  . Murmur, cardiac 04/15/2017  . Asymptomatic postmenopausal estrogen deficiency 04/15/2017  . Right-sided low back pain without sciatica 02/10/2017  . Dark stools 12/23/2016  . Grief at loss of child 07/29/2016  . Acute upper respiratory infection 02/27/2016  . Allergic rhinitis 02/27/2016  . Rash and nonspecific skin eruption 11/13/2015  . Lightheadedness 08/21/2015  . GERD (gastroesophageal reflux disease) 08/06/2015  . Baker's cyst, ruptured 08/03/2015  . Hip pain 06/29/2015  . Type 2 diabetes mellitus ([place]) 06/29/2015  . Arthritis of left knee 06/15/2015  . Left knee pain 06/09/2015  . Pain of left side of body 02/22/2015  . Benign essential HTN 02/18/2015  . Cellulitis 02/18/2015  . Sciatica 01/17/2015  . Routine general medical examination at a health care facility 12/26/2014  . Medicare annual wellness visit, subsequent 12/26/2014    Current Outpatient Medications  Medication Sig Dispense Refill  . amLODipine (NORVASC) 10 MG tablet TAKE 1 TABLET (10 MG TOTAL) BY MOUTH DAILY. 90 tablet 1  . blood glucose meter kit and supplies KIT Dispense based on patient and insurance preference. Use up to four times daily as directed. E11.9 . 1 each 0  . carvedilol (COREG) 3.125 MG tablet Take 1 tablet (3.125 mg total) by mouth 2 (two) times daily with a meal. 180 tablet 1  . Cholecalciferol (VITAMIN D-3) 1000 UNITS CAPS Take 1,000 Units by mouth.    . clobetasol cream (TEMOVATE) 4.65 % APPLY 1 APPLICATION 2 TIMES DAILY. DO NOT USE FOR MORE THAN TWO WEEKS AT ONE TIME 30 g 0  . cloNIDine (CATAPRES) 0.1 MG tablet Take 1 tablet (0.1 mg total) by mouth 2 (two) times daily. Start with Clonidine at night as directed; increase to twice a day when directed 60 tablet 1  . esomeprazole (NEXIUM) 40 MG capsule Take 1 capsule (40 mg total) by mouth daily at 12  noon. 30 capsule 0  . gabapentin (NEURONTIN) 300 MG capsule TAKE 1 CAPSULE BY MOUTH THREE TIMES A DAY 90 capsule 0  . glucose blood (ONE TOUCH ULTRA TEST) test strip TEST UP TO 4 TIMES DAILY 120 each 5  . losartan (COZAAR) 100 MG tablet TAKE 1 TABLET BY MOUTH EVERY DAY 90 tablet 1  . metFORMIN (GLUCOPHAGE-XR) 500 MG 24 hr tablet Take 1 tablet (500 mg total) by mouth daily with breakfast. Take 1 tablet by mouth 2 times daily with meals. (Patient taking differently: Take 500 mg by mouth 2 (two) times daily. Take 1 tablet by mouth 2 times daily with meals.) 180 tablet 1  . ONETOUCH DELICA LANCETS FINE MISC TEST UP TO 4 TIMES DAILY AS DIRECTED 100 each 0  . sertraline (ZOLOFT) 100 MG tablet Take 1 tablet (100 mg total) by mouth daily. 90 tablet 1  . simvastatin (ZOCOR) 20 MG tablet Take 20 mg by mouth daily.  1  . potassium chloride 20 MEQ TBCR Take 20 mEq by mouth daily for 5 days. 5 tablet 0   No current facility-administered medications for this visit.     Allergies: Codeine; Other; and Shellfish allergy  Past Medical History:  Diagnosis Date  . Arthritis   . Benign essential HTN 02/18/2015  . Cellulitis 02/18/2015  . Chicken pox   . Diabetes mellitus without complication ([place])   . Diabetic peripheral neuropathy ([place])   . GERD (gastroesophageal reflux disease)   .  Hypercholesteremia   . Hypertension   . Liver disease   . Polyp of colon     Past Surgical History:  Procedure Laterality Date  . APPENDECTOMY    . HERNIA REPAIR    . KNEE SURGERY    . ROTATOR CUFF REPAIR    . TONSILLECTOMY      Family History  Problem Relation Age of Onset  . Healthy Mother   . Healthy Father   . Healthy Paternal Grandmother   . Healthy Paternal Grandfather     Social History   Tobacco Use  . Smoking status: Never Smoker  . Smokeless tobacco: Never Used  Substance Use Topics  . Alcohol use: No    Subjective:  Patient presents for follow-up on her hypertension and hypokalemia; admits she has  not been taking the potassium- "forgot about it." Is now taking the Clonidine at night with other medications and blood pressure "much better." Notes the highest number she saw this week was 140/ 80;  Mentions recurrent headaches and feels that the left side of her head "will swell" occasionally. CT done at ER recently did show mild atrophy; patient has never been evaluated by neurology;  Admits she has been taking Metformin tid and "sometimes gets shaky"; has been referred to GI due to the diarrhea- at this time, patient has not scheduled with GI.   Objective:  Vitals:   07/10/17 1034  BP: 136/80  Pulse: 70  Temp: 98.3 F (36.8 C)  SpO2: 99%  Weight: 149 lb (67.6 kg)    General: Well developed, well nourished, in no acute distress  Skin : Warm and dry.  Head: Normocephalic and atraumatic  Eyes: Sclera and conjunctiva clear; pupils round and reactive to light; extraocular movements intact  Ears: External normal; canals clear; tympanic membranes normal  Oropharynx: Pink, supple. No suspicious lesions  Neck: Supple without thyromegaly, adenopathy  Lungs: Respirations unlabored; clear to auscultation bilaterally without wheeze, rales, rhonchi  CVS exam: normal rate and regular rhythm.  Neurologic: Alert and oriented; speech intact; face symmetrical; moves all extremities well; CNII-XII intact without focal deficit  Assessment:  1. Essential hypertension   2. Recurrent headache   3. Memory change   4. Cerebral atrophy, mild   5. Type 2 diabetes mellitus without complication, without long-term current use of insulin ([place])     Plan:  1. Controlled with addition of Clonidine; medication review done again and she understands to continue taking medications as prescribed; 2., 3. & 4. This is the 3rd visit I have had with patient and am concerned about memory issues/ possible dementia; mild cerebral atrophy was noted on CT done at [facility] ER earlier this month; due to mention of pain/ swelling  over left temple, will check sed rate and CRP for temporal arteritis; have referred to neurology; 5. Reminded patient that she is only supposed to take her Metformin bid; explained that increased Metformin could be causing diarrhea; check Hgba1c today;   No Follow-up on file.  Orders Placed This Encounter  Procedures  . Comprehensive metabolic panel    Standing Status:   Future    Number of Occurrences:   1    Standing Expiration Date:   07/10/2018  . HgB A1c    Standing Status:   Future    Number of Occurrences:   1    Standing Expiration Date:   07/10/2018  . Sed Rate (ESR)    Standing Status:   Future    Number of Occurrences:  1    Standing Expiration Date:   07/10/2018  . C-reactive protein    Standing Status:   Future    Number of Occurrences:   1    Standing Expiration Date:   07/10/2018  . Ambulatory referral to Neurology    Referral Priority:   Routine    Referral Type:   Consultation    Referral Reason:   Specialty Services Required    Requested Specialty:   Neurology    Number of Visits Requested:   1    Requested Prescriptions    No prescriptions requested or ordered in this encounter

## 2017-07-26 ENCOUNTER — Other Ambulatory Visit: Payer: Self-pay | Admitting: Family

## 2017-07-26 DIAGNOSIS — I10 Essential (primary) hypertension: Secondary | ICD-10-CM

## 2017-08-28 ENCOUNTER — Telehealth: Payer: Self-pay | Admitting: Nurse Practitioner

## 2017-08-28 ENCOUNTER — Ambulatory Visit: Payer: PPO | Admitting: Family

## 2017-08-28 ENCOUNTER — Other Ambulatory Visit: Payer: Self-pay | Admitting: Family

## 2017-08-28 ENCOUNTER — Encounter: Payer: Self-pay | Admitting: Family

## 2017-08-28 ENCOUNTER — Other Ambulatory Visit (INDEPENDENT_AMBULATORY_CARE_PROVIDER_SITE_OTHER): Payer: PPO

## 2017-08-28 VITALS — BP 132/74 | HR 75 | Temp 98.0°F | Ht 63.0 in | Wt 149.0 lb

## 2017-08-28 DIAGNOSIS — J309 Allergic rhinitis, unspecified: Secondary | ICD-10-CM

## 2017-08-28 DIAGNOSIS — R5383 Other fatigue: Secondary | ICD-10-CM

## 2017-08-28 DIAGNOSIS — I10 Essential (primary) hypertension: Secondary | ICD-10-CM

## 2017-08-28 DIAGNOSIS — E538 Deficiency of other specified B group vitamins: Secondary | ICD-10-CM

## 2017-08-28 DIAGNOSIS — R200 Anesthesia of skin: Secondary | ICD-10-CM | POA: Diagnosis not present

## 2017-08-28 DIAGNOSIS — R202 Paresthesia of skin: Secondary | ICD-10-CM | POA: Diagnosis not present

## 2017-08-28 DIAGNOSIS — E119 Type 2 diabetes mellitus without complications: Secondary | ICD-10-CM

## 2017-08-28 LAB — CBC WITH DIFFERENTIAL/PLATELET
BASOS ABS: 0.1 10*3/uL (ref 0.0–0.1)
Basophils Relative: 1 % (ref 0.0–3.0)
Eosinophils Absolute: 0.2 10*3/uL (ref 0.0–0.7)
Eosinophils Relative: 3.5 % (ref 0.0–5.0)
HCT: 38.1 % (ref 36.0–46.0)
Hemoglobin: 12.8 g/dL (ref 12.0–15.0)
LYMPHS ABS: 2.2 10*3/uL (ref 0.7–4.0)
Lymphocytes Relative: 39.5 % (ref 12.0–46.0)
MCHC: 33.6 g/dL (ref 30.0–36.0)
MCV: 84.8 fl (ref 78.0–100.0)
MONOS PCT: 8.1 % (ref 3.0–12.0)
Monocytes Absolute: 0.5 10*3/uL (ref 0.1–1.0)
NEUTROS PCT: 47.9 % (ref 43.0–77.0)
Neutro Abs: 2.7 10*3/uL (ref 1.4–7.7)
Platelets: 389 10*3/uL (ref 150.0–400.0)
RBC: 4.49 Mil/uL (ref 3.87–5.11)
RDW: 14.8 % (ref 11.5–15.5)
WBC: 5.7 10*3/uL (ref 4.0–10.5)

## 2017-08-28 LAB — COMPREHENSIVE METABOLIC PANEL
ALK PHOS: 67 U/L (ref 39–117)
ALT: 10 U/L (ref 0–35)
AST: 13 U/L (ref 0–37)
Albumin: 4.1 g/dL (ref 3.5–5.2)
BILIRUBIN TOTAL: 0.5 mg/dL (ref 0.2–1.2)
BUN: 7 mg/dL (ref 6–23)
CO2: 30 meq/L (ref 19–32)
Calcium: 10.1 mg/dL (ref 8.4–10.5)
Chloride: 102 mEq/L (ref 96–112)
Creatinine, Ser: 0.74 mg/dL (ref 0.40–1.20)
GFR: 98.61 mL/min (ref >60.00)
GLUCOSE: 156 mg/dL — AB (ref 70–99)
Potassium: 3 mEq/L — ABNORMAL LOW (ref 3.5–5.1)
SODIUM: 140 meq/L (ref 135–145)
Total Protein: 7.6 g/dL (ref 6.0–8.3)

## 2017-08-28 LAB — TSH: TSH: 0.86 u[IU]/mL (ref 0.35–4.50)

## 2017-08-28 LAB — VITAMIN B12: Vitamin B-12: 188 pg/mL — ABNORMAL LOW (ref 211–911)

## 2017-08-28 LAB — VITAMIN D 25 HYDROXY (VIT D DEFICIENCY, FRACTURES): VITD: 27.46 ng/mL — ABNORMAL LOW (ref 30.00–100.00)

## 2017-08-28 LAB — POCT GLUCOSE (DEVICE FOR HOME USE): Glucose Fasting, POC: 58 mg/dL — AB (ref 70–99)

## 2017-08-28 LAB — MAGNESIUM: Magnesium: 1.8 mg/dL (ref 1.5–2.5)

## 2017-08-28 LAB — HEMOGLOBIN A1C: HEMOGLOBIN A1C: 5.7 % (ref 4.6–6.5)

## 2017-08-28 MED ORDER — CYANOCOBALAMIN 1000 MCG/ML IJ SOLN
1000.0000 ug | INTRAMUSCULAR | Status: AC
Start: 2017-08-31 — End: 2017-09-27

## 2017-08-28 MED ORDER — FLUTICASONE PROPIONATE 50 MCG/ACT NA SUSP: 2.0000 | Freq: Every day | NASAL | 6 refills | Status: DC

## 2017-08-28 MED ORDER — POTASSIUM CHLORIDE ER 20 MEQ PO TBCR: 20.0000 meq | EXTENDED_RELEASE_TABLET | Freq: Every day | ORAL | 2 refills | Status: DC

## 2017-08-28 NOTE — Progress Notes (Signed)
(  FYI) Spoke with patient and her daughter and info given. I made her an apt for this coming Tuesday for B12 injection and I put in there to have her glucose rechecked.

## 2017-08-28 NOTE — Patient Instructions (Signed)
Please hold the Metformin for now; we will be in touch once I get your labs back.

## 2017-08-28 NOTE — Progress Notes (Signed)
Meredith Phillips is a 72 y.o. female with the following history as recorded in EpicCare:  Patient Active Problem List   Diagnosis Date Noted  . Murmur, cardiac 04/15/2017  . Asymptomatic postmenopausal estrogen deficiency 04/15/2017  . Right-sided low back pain without sciatica 02/10/2017  . Dark stools 12/23/2016  . Grief at loss of child 07/29/2016  . Acute upper respiratory infection 02/27/2016  . Allergic rhinitis 02/27/2016  . Rash and nonspecific skin eruption 11/13/2015  . Lightheadedness 08/21/2015  . GERD (gastroesophageal reflux disease) 08/06/2015  . Baker's cyst, ruptured 08/03/2015  . Hip pain 06/29/2015  . Type 2 diabetes mellitus ([place]) 06/29/2015  . Arthritis of left knee 06/15/2015  . Left knee pain 06/09/2015  . Pain of left side of body 02/22/2015  . Benign essential HTN 02/18/2015  . Cellulitis 02/18/2015  . Sciatica 01/17/2015  . Routine general medical examination at a health care facility 12/26/2014  . Medicare annual wellness visit, subsequent 12/26/2014    Current Outpatient Medications  Medication Sig Dispense Refill  . amLODipine (NORVASC) 10 MG tablet TAKE 1 TABLET (10 MG TOTAL) BY MOUTH DAILY. 90 tablet 1  . blood glucose meter kit and supplies KIT Dispense based on patient and insurance preference. Use up to four times daily as directed. E11.9 . 1 each 0  . carvedilol (COREG) 3.125 MG tablet Take 1 tablet (3.125 mg total) by mouth 2 (two) times daily with a meal. 180 tablet 1  . Cholecalciferol (VITAMIN D-3) 1000 UNITS CAPS Take 1,000 Units by mouth.    . clobetasol cream (TEMOVATE) 6.76 % APPLY 1 APPLICATION 2 TIMES DAILY. DO NOT USE FOR MORE THAN TWO WEEKS AT ONE TIME 30 g 0  . cloNIDine (CATAPRES) 0.1 MG tablet Take 1 tablet (0.1 mg total) by mouth 2 (two) times daily. Start with Clonidine at night as directed; increase to twice a day when directed 60 tablet 1  . esomeprazole (NEXIUM) 40 MG capsule Take 1 capsule (40 mg total) by mouth daily at 12  noon. 30 capsule 0  . gabapentin (NEURONTIN) 300 MG capsule TAKE 1 CAPSULE BY MOUTH THREE TIMES A DAY 90 capsule 0  . glucose blood (ONE TOUCH ULTRA TEST) test strip TEST UP TO 4 TIMES DAILY 120 each 5  . losartan (COZAAR) 100 MG tablet TAKE 1 TABLET BY MOUTH EVERY DAY 90 tablet 1  . metFORMIN (GLUCOPHAGE-XR) 500 MG 24 hr tablet Take 1 tablet (500 mg total) by mouth daily with breakfast. Take 1 tablet by mouth 2 times daily with meals. (Patient taking differently: Take 500 mg by mouth 2 (two) times daily. Take 1 tablet by mouth 2 times daily with meals.) 180 tablet 1  . ONETOUCH DELICA LANCETS FINE MISC TEST UP TO 4 TIMES DAILY AS DIRECTED 100 each 0  . sertraline (ZOLOFT) 100 MG tablet Take 1 tablet (100 mg total) by mouth daily. 90 tablet 1  . simvastatin (ZOCOR) 20 MG tablet Take 20 mg by mouth daily.  1  . fluticasone (FLONASE) 50 MCG/ACT nasal spray Place 2 sprays into both nostrils daily. 16 g 6  . potassium chloride 20 MEQ TBCR Take 20 mEq by mouth daily for 5 days. 5 tablet 0   No current facility-administered medications for this visit.     Allergies: Codeine; Other; and Shellfish allergy  Past Medical History:  Diagnosis Date  . Arthritis   . Benign essential HTN 02/18/2015  . Cellulitis 02/18/2015  . Chicken pox   . Diabetes mellitus without  complication ([place])   . Diabetic peripheral neuropathy ([place])   . GERD (gastroesophageal reflux disease)   . Hypercholesteremia   . Hypertension   . Liver disease   . Polyp of colon     Past Surgical History:  Procedure Laterality Date  . APPENDECTOMY    . HERNIA REPAIR    . KNEE SURGERY    . ROTATOR CUFF REPAIR    . TONSILLECTOMY      Family History  Problem Relation Age of Onset  . Healthy Mother   . Healthy Father   . Healthy Paternal Grandmother   . Healthy Paternal Grandfather     Social History   Tobacco Use  . Smoking status: Never Smoker  . Smokeless tobacco: Never Used  Substance Use Topics  . Alcohol use: No     Subjective:  3 week history of fatigue; is taking blood pressure medication as prescribed- feeling much better since gaining control; has cut Metformin back to once a day- blood sugar in office is 58 today; admits that she does not check her blood sugar regularly; realized recently that she had not been taking her Zoloft for about the past 4 weeks; at last office visit, patient was referred to neurology about her headache/ memory changes- she has opted to cancel that appointment due to cost concerns; appointment was scheduled for September 14, 2017; feels that she is sleeping well; notes that she just doesn't have any energy;   Objective:  Vitals:   08/28/17 1023  BP: 132/74  Pulse: 75  Temp: 98 F (36.7 C)  TempSrc: Oral  SpO2: 99%  Weight: 149 lb (67.6 kg)  Height: _0  (1.6 m)    General: Well developed, well nourished, in no acute distress  Skin : Warm and dry.  Head: Normocephalic and atraumatic  Lungs: Respirations unlabored; clear to auscultation bilaterally without wheeze, rales, rhonchi  CVS exam: normal rate and regular rhythm.  Neurologic: Alert and oriented; speech intact; face symmetrical; moves all extremities well; CNII-XII intact without focal deficit   Assessment:  1. Type 2 diabetes mellitus without complication, without long-term current use of insulin ([facility])   2. Other fatigue   3. Numbness and tingling   4. Benign essential HTN     Plan:  1. ? If hypoglycemia causing symptoms- blood sugar this am after Metformin is at 58; patient will hold Metformin until further notice; she does note that headaches and diarrhea have improved greatly since taking lowered dosages of Metformin as well. Follow-up to be determined.  2. ? 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 Check B12 and TSH today; follow-up to be determined; 4. Stable; appears well  controlled; continue same medications;   Patient is opting against seeing neurology at this time about her memory and headaches due to cost issues; she will call back when she financially feels that she can do this appointment.   No Follow-up on file.  Orders Placed This Encounter  Procedures  . CBC w/Diff    Standing Status:   Future    Number of Occurrences:   1    Standing Expiration Date:   08/28/2018  . Comp Met (CMET)    Standing Status:   Future    Number of Occurrences:   1    Standing Expiration Date:   08/28/2018  . TSH    Standing Status:  Future    Number of Occurrences:   1    Standing Expiration Date:   08/28/2018  . B12    Standing Status:   Future    Number of Occurrences:   1    Standing Expiration Date:   08/28/2018  . Magnesium    Standing Status:   Future    Number of Occurrences:   1    Standing Expiration Date:   08/28/2018  . Vitamin D (25 hydroxy)    Standing Status:   Future    Number of Occurrences:   1    Standing Expiration Date:   08/28/2018  . HgB A1c    Standing Status:   Future    Number of Occurrences:   1    Standing Expiration Date:   08/28/2018  . POCT Glucose (Device for Home Use)    Requested Prescriptions   Signed Prescriptions Disp Refills  . fluticasone (FLONASE) 50 MCG/ACT nasal spray 16 g 6    Sig: Place 2 sprays into both nostrils daily.

## 2017-08-28 NOTE — Telephone Encounter (Signed)
Copied from CRM [id number]. Topic: Quick Communication - Rx Refill/Question >> Aug 28, 2017  4:24 PM Lelon Frohlich, [place] wrote: Medication: potassium   Has the patient contacted their pharmacy? yes   (Agent: If no, request that the patient contact the pharmacy for the refill.)   Preferred Pharmacy (with phone number [facility] street name[facility] [address]   Agent: Please be advised that RX refills may take up to 3 business days. We ask that you follow-up with your pharmacy.

## 2017-09-01 ENCOUNTER — Telehealth: Payer: Self-pay | Admitting: *Deleted

## 2017-09-01 ENCOUNTER — Ambulatory Visit (INDEPENDENT_AMBULATORY_CARE_PROVIDER_SITE_OTHER): Payer: PPO | Admitting: *Deleted

## 2017-09-01 DIAGNOSIS — E538 Deficiency of other specified B group vitamins: Secondary | ICD-10-CM | POA: Diagnosis not present

## 2017-09-01 DIAGNOSIS — I10 Essential (primary) hypertension: Secondary | ICD-10-CM

## 2017-09-01 MED ORDER — ONETOUCH ULTRA 2 W/DEVICE KIT: PACK | 0 refills | Status: DC

## 2017-09-01 MED ORDER — AMLODIPINE BESYLATE 10 MG PO TABS: 10.0000 mg | ORAL_TABLET | Freq: Every day | ORAL | 1 refills | Status: DC

## 2017-09-01 MED ORDER — GLUCOSE BLOOD VI STRP: ORAL_STRIP | 1 refills | Status: DC

## 2017-09-01 MED ORDER — CYANOCOBALAMIN 1000 MCG/ML IJ SOLN
1000.0000 ug | Freq: Once | INTRAMUSCULAR | Status: AC
  Administered 2017-09-01: 1000 ug via INTRAMUSCULAR

## 2017-09-01 MED ORDER — SIMVASTATIN 20 MG PO TABS: 20.0000 mg | ORAL_TABLET | Freq: Every day | ORAL | 1 refills | Status: DC

## 2017-09-01 MED ORDER — ONETOUCH ULTRASOFT LANCETS MISC
1 refills | Status: DC
Start: 2017-09-01 — End: 2017-11-28

## 2017-09-01 NOTE — Telephone Encounter (Signed)
Pt came in to get her first B12. Pt/caregiver wanted to make go over meds to make sure she is taking everything correctly. Pt was missing her simvastatin and the metformin which she stated MD told her to stop taking. Pt is also needing refill on her amlodipine. Needing to know what pt is suppose to take for her blood sugars. I check her BS whole she was her and her BS was 194. She states she can not take the metformin because it makes her sick. Inform pt will send msg to [facility] and give hear call bck on alternative for BS. Pt saw Vernona Rieger last week 08/28/17 but she is not in the office pls advise.Marland KitchenRaechel Chute

## 2017-09-02 ENCOUNTER — Other Ambulatory Visit: Payer: Self-pay | Admitting: Family

## 2017-09-02 MED ORDER — EMPAGLIFLOZIN 25 MG PO TABS: 25.0000 mg | ORAL_TABLET | Freq: Every day | ORAL | 1 refills | Status: DC

## 2017-09-02 NOTE — Telephone Encounter (Signed)
Pls advise on msg below.../lmb 

## 2017-09-02 NOTE — Telephone Encounter (Signed)
Called pt there was no answer LMOM for pt/caregiver to give me a call back (sent CRM to [facility] for FYI)...lmb

## 2017-09-02 NOTE — Telephone Encounter (Signed)
Is there anyway the caregiver could come with her next week? Let's have her do an OV with me or Ashleigh to go over medications again. We can do the B12 at the time of the OV. I am here M, W, F next week. I would like 40 minutes with her please.  She was not told to stop the Simvastatin. Needs to get back on that. I will call in Jardiance as alternative to her Metformin. I really want her to see the neurologist as we discussed for memory changes.

## 2017-09-03 NOTE — Telephone Encounter (Signed)
Called pt advice her w/laura response. She agreed to take the Jardiance.. [facility]. Also made her appt to for next week 09/09/17.Marland KitchenRaechel Chute

## 2017-09-08 ENCOUNTER — Ambulatory Visit: Payer: Self-pay

## 2017-09-09 ENCOUNTER — Ambulatory Visit (INDEPENDENT_AMBULATORY_CARE_PROVIDER_SITE_OTHER): Payer: PPO | Admitting: Family

## 2017-09-09 ENCOUNTER — Other Ambulatory Visit (INDEPENDENT_AMBULATORY_CARE_PROVIDER_SITE_OTHER): Payer: PPO

## 2017-09-09 ENCOUNTER — Encounter: Payer: Self-pay | Admitting: Family

## 2017-09-09 ENCOUNTER — Other Ambulatory Visit: Payer: Self-pay | Admitting: Family

## 2017-09-09 VITALS — BP 112/80 | HR 72 | Temp 98.4°F | Ht 63.0 in | Wt 150.0 lb

## 2017-09-09 DIAGNOSIS — E876 Hypokalemia: Secondary | ICD-10-CM | POA: Diagnosis not present

## 2017-09-09 DIAGNOSIS — I10 Essential (primary) hypertension: Secondary | ICD-10-CM

## 2017-09-09 DIAGNOSIS — R413 Other amnesia: Secondary | ICD-10-CM | POA: Diagnosis not present

## 2017-09-09 DIAGNOSIS — E538 Deficiency of other specified B group vitamins: Secondary | ICD-10-CM

## 2017-09-09 DIAGNOSIS — E119 Type 2 diabetes mellitus without complications: Secondary | ICD-10-CM

## 2017-09-09 LAB — BASIC METABOLIC PANEL
BUN: 16 mg/dL (ref 6–23)
CHLORIDE: 105 meq/L (ref 96–112)
CO2: 28 mEq/L (ref 19–32)
Calcium: 10.1 mg/dL (ref 8.4–10.5)
Creatinine, Ser: 0.91 mg/dL (ref 0.40–1.20)
GFR: 77.67 mL/min (ref >60.00)
Glucose, Bld: 123 mg/dL — ABNORMAL HIGH (ref 70–99)
Potassium: 4.5 mEq/L (ref 3.5–5.1)
SODIUM: 139 meq/L (ref 135–145)

## 2017-09-09 MED ORDER — CYANOCOBALAMIN 1000 MCG/ML IJ SOLN
1000.0000 ug | Freq: Once | INTRAMUSCULAR | Status: AC
Start: 2017-09-09 — End: 2017-09-09
  Administered 2017-09-09: 1000 ug via INTRAMUSCULAR

## 2017-09-09 NOTE — Progress Notes (Signed)
Meredith Phillips is a 72 y.o. female with the following history as recorded in EpicCare:  Patient Active Problem List   Diagnosis Date Noted  . Murmur, cardiac 04/15/2017  . Asymptomatic postmenopausal estrogen deficiency 04/15/2017  . Right-sided low back pain without sciatica 02/10/2017  . Dark stools 12/23/2016  . Grief at loss of child 07/29/2016  . Acute upper respiratory infection 02/27/2016  . Allergic rhinitis 02/27/2016  . Rash and nonspecific skin eruption 11/13/2015  . Lightheadedness 08/21/2015  . GERD (gastroesophageal reflux disease) 08/06/2015  . Baker's cyst, ruptured 08/03/2015  . Hip pain 06/29/2015  . Type 2 diabetes mellitus ([place]) 06/29/2015  . Arthritis of left knee 06/15/2015  . Left knee pain 06/09/2015  . Pain of left side of body 02/22/2015  . Benign essential HTN 02/18/2015  . Cellulitis 02/18/2015  . Sciatica 01/17/2015  . Routine general medical examination at a health care facility 12/26/2014  . Medicare annual wellness visit, subsequent 12/26/2014    Current Outpatient Medications  Medication Sig Dispense Refill  . amLODipine (NORVASC) 10 MG tablet Take 1 tablet (10 mg total) by mouth daily. 90 tablet 1  . aspirin EC 81 MG tablet Take 81 mg by mouth daily.    . Blood Glucose Monitoring Suppl (ONE TOUCH ULTRA 2) w/Device KIT Use to check blood sugars twice a day Dx E11.9 1 each 0  . carvedilol (COREG) 3.125 MG tablet Take 1 tablet (3.125 mg total) by mouth 2 (two) times daily with a meal. 180 tablet 1  . Cholecalciferol (VITAMIN D-3) 1000 UNITS CAPS Take 1,000 Units by mouth.    . clobetasol cream (TEMOVATE) 5.27 % APPLY 1 APPLICATION 2 TIMES DAILY. DO NOT USE FOR MORE THAN TWO WEEKS AT ONE TIME 30 g 0  . cloNIDine (CATAPRES) 0.1 MG tablet Take 1 tablet (0.1 mg total) by mouth 2 (two) times daily. Start with Clonidine at night as directed; increase to twice a day when directed 60 tablet 1  . empagliflozin (JARDIANCE) 25 MG TABS tablet Take 25 mg by  mouth daily. 30 tablet 1  . esomeprazole (NEXIUM) 40 MG capsule Take 1 capsule (40 mg total) by mouth daily at 12 noon. 30 capsule 0  . fluticasone (FLONASE) 50 MCG/ACT nasal spray Place 2 sprays into both nostrils daily. 16 g 6  . gabapentin (NEURONTIN) 300 MG capsule TAKE 1 CAPSULE BY MOUTH THREE TIMES A DAY 90 capsule 0  . glucose blood (ONE TOUCH ULTRA TEST) test strip Use to check blood sugars twice a day Dx e11.9 100 each 1  . KLOR-CON M20 20 MEQ tablet TAKE 20 MEQ BY MOUTH DAILY.  2  . Lancets (ONETOUCH ULTRASOFT) lancets Use to help check blood sugars twice a day E11.9 100 each 1  . losartan (COZAAR) 100 MG tablet TAKE 1 TABLET BY MOUTH EVERY DAY 90 tablet 1  . Potassium Chloride ER 20 MEQ TBCR Take 20 mEq by mouth daily. 30 tablet 2  . sertraline (ZOLOFT) 100 MG tablet Take 1 tablet (100 mg total) by mouth daily. 90 tablet 1  . simvastatin (ZOCOR) 20 MG tablet Take 1 tablet (20 mg total) by mouth daily. 30 tablet 1   Current Facility-Administered Medications  Medication Dose Route Frequency Provider Last Rate Last Dose  . cyanocobalamin ((VITAMIN B-12)) injection 1,000 mcg  1,000 mcg Intramuscular Q7 days Marrian Salvage, FNP        Allergies: Codeine; Other; and Shellfish allergy  Past Medical History:  Diagnosis Date  .  Arthritis   . Benign essential HTN 02/18/2015  . Cellulitis 02/18/2015  . Chicken pox   . Diabetes mellitus without complication ([place])   . Diabetic peripheral neuropathy ([place])   . GERD (gastroesophageal reflux disease)   . Hypercholesteremia   . Hypertension   . Liver disease   . Polyp of colon     Past Surgical History:  Procedure Laterality Date  . APPENDECTOMY    . HERNIA REPAIR    . KNEE SURGERY    . ROTATOR CUFF REPAIR    . TONSILLECTOMY      Family History  Problem Relation Age of Onset  . Healthy Mother   . Healthy Father   . Healthy Paternal Grandmother   . Healthy Paternal Grandfather     Social History   Tobacco Use  . Smoking  status: Never Smoker  . Smokeless tobacco: Never Used  Substance Use Topics  . Alcohol use: No    Subjective:  1 week follow-up on recent start of Jardiance for diabetes; need to verify her new meter is accurate- notes she is not able to get blood sugar readings; need to do medication review with patient and her daughter as she is continuing to have problems understanding her medication list. Patient needs another B12 injection today.   There are noticeable memory issues that have been discussed in the past- recommended neurology evaluation but patient cancelled her appointment that was scheduled for early April.  Objective:  Vitals:   09/09/17 0956  BP: 112/80  Pulse: 72  Temp: 98.4 F (36.9 C)  TempSrc: Oral  SpO2: 98%  Weight: 150 lb 0.6 oz (68.1 kg)  Height: _0  (1.6 m)    General: Well developed, well nourished, in no acute distress  Skin : Warm and dry.  Head: Normocephalic and atraumatic  Lungs: Respirations unlabored; clear to auscultation bilaterally without wheeze, rales, rhonchi  CVS exam: normal rate and regular rhythm.  Neurologic: Alert and oriented; speech intact; face symmetrical; moves all extremities well; CNII-XII intact without focal deficit   Assessment:  1. Memory changes   2. Hypokalemia   3. B12 deficiency   4. Benign essential HTN   5. Type 2 diabetes mellitus without complication, without long-term current use of insulin ([place])     Plan:  1. Refer back to neurology; stressed importance of keeping appointment; her daughter agrees to make sure mother goes to the appointment; 2. Check BMP today; 3. B12 injection given today; return next week for 3 of 4 planned injections; 4. Stable; reviewed medications; 5. Tolerating Jardiance well; feels much better off Metformin; BS in office at 119; stressed need to stay on her Simvastatin and start taking daily Baby aspirin;  Time spent with patient 35 minutes; greater then 35% spent in counseling/ medication  review;  No follow-ups on file.  Orders Placed This Encounter  Procedures  . Basic Metabolic Panel (BMET)    Standing Status:   Future    Number of Occurrences:   1    Standing Expiration Date:   09/09/2018  . Ambulatory referral to Neurology    Referral Priority:   Routine    Referral Type:   Consultation    Referral Reason:   Specialty Services Required    Requested Specialty:   Neurology    Number of Visits Requested:   1    Requested Prescriptions    No prescriptions requested or ordered in this encounter

## 2017-09-14 ENCOUNTER — Ambulatory Visit: Payer: Self-pay | Admitting: Diagnostic Neuroimaging

## 2017-09-15 ENCOUNTER — Ambulatory Visit (INDEPENDENT_AMBULATORY_CARE_PROVIDER_SITE_OTHER): Payer: PPO | Admitting: *Deleted

## 2017-09-15 DIAGNOSIS — E538 Deficiency of other specified B group vitamins: Secondary | ICD-10-CM | POA: Diagnosis not present

## 2017-09-15 MED ORDER — CYANOCOBALAMIN 1000 MCG/ML IJ SOLN
1000.0000 ug | Freq: Once | INTRAMUSCULAR | Status: AC
  Administered 2017-09-15: 1000 ug via INTRAMUSCULAR

## 2017-09-22 ENCOUNTER — Ambulatory Visit (INDEPENDENT_AMBULATORY_CARE_PROVIDER_SITE_OTHER): Payer: PPO | Admitting: *Deleted

## 2017-09-22 DIAGNOSIS — E538 Deficiency of other specified B group vitamins: Secondary | ICD-10-CM

## 2017-09-22 MED ORDER — CYANOCOBALAMIN 1000 MCG/ML IJ SOLN
1000.0000 ug | Freq: Once | INTRAMUSCULAR | Status: AC
  Administered 2017-09-22: 1000 ug via INTRAMUSCULAR

## 2017-09-23 ENCOUNTER — Other Ambulatory Visit: Payer: Self-pay | Admitting: Family

## 2017-09-29 ENCOUNTER — Ambulatory Visit (INDEPENDENT_AMBULATORY_CARE_PROVIDER_SITE_OTHER): Payer: PPO | Admitting: *Deleted

## 2017-09-29 DIAGNOSIS — E538 Deficiency of other specified B group vitamins: Secondary | ICD-10-CM | POA: Diagnosis not present

## 2017-09-29 MED ORDER — CYANOCOBALAMIN 1000 MCG/ML IJ SOLN
1000.0000 ug | Freq: Once | INTRAMUSCULAR | Status: AC
  Administered 2017-09-29: 1000 ug via INTRAMUSCULAR

## 2017-10-09 ENCOUNTER — Ambulatory Visit: Payer: Self-pay | Admitting: *Deleted

## 2017-10-09 NOTE — Telephone Encounter (Signed)
Pt called with c/o pain in her right back and side. She states that it has gotten worse and it "catches" it her. She has to stop what she is doing until it passes. Advised to try and take Ext Tylenol for the pain. Home care advice given to pt with verbal understanding.  Appointment made for tomorrow.   Pt also called with c/o having dizziness to the point where she has to hold on to something to keep from falling. She says sometimes the room is tilting. Has never been diagnosed with vertigo. This started about 2 weeks ago. Home care advice given to patient with verbal understanding. She already has an appointment for tomorrow. Pt voiced understanding and to call back for worsening symptoms.  Reason for Disposition . [1] MODERATE back pain (e.g., interferes with normal activities) AND [2] present > 3 days  Answer Assessment - Initial Assessment Questions 1. DESCRIPTION: "Describe your dizziness."     woozy 2. VERTIGO: "Do you feel like either you or the room is spinning or tilting?"      Sometimes tilting 3. LIGHTHEADED: "Do you feel lightheaded?" (e.g., somewhat faint, woozy, weak upon standing)     faint 4. SEVERITY: "How bad is it?"  "Can you walk?"   - MILD - Feels unsteady but walking normally.   - MODERATE - Feels very unsteady when walking, but not falling; interferes with normal activities (e.g., school, work) .   - SEVERE - Unable to walk without falling (requires assistance).     moderate 5. ONSET:  "When did the dizziness begin?"     2 weeks ago 6. AGGRAVATING FACTORS: "Does anything make it worse?" (e.g., standing, change in head position)     Change in head position 7. CAUSE: "What do you think is causing the dizziness?"     Not sure 8. RECURRENT SYMPTOM: "Have you had dizziness before?" If so, ask: "When was the last time?" "What happened that time?"     Yes and did not have inner ear problems. But did not have an answer to what was going on.  9. OTHER SYMPTOMS: "Do you have  any other symptoms?" (e.g., headache, weakness, numbness, vomiting, earache)     Weakness,  10. PREGNANCY: "Is there any chance you are pregnant?" "When was your last menstrual period?"       no  Answer Assessment - Initial Assessment Questions 1. ONSET: "When did the pain begin?"      Started about a week ago 2. LOCATION: "Where does it hurt?" (upper, mid or lower back)     Right side that goes to the back 3. SEVERITY: "How bad is the pain?"  (e.g., Scale 1-10; mild, moderate, or severe)   - MILD (1-3): doesn't interfere with normal activities    - MODERATE (4-7): interferes with normal activities or awakens from sleep    - SEVERE (8-10): excruciating pain, unable to do any normal activities      Pain # 7 4. PATTERN: "Is the pain constant?" (e.g., yes, no; constant, intermittent)      constant 5. RADIATION: "Does the pain shoot into your legs or elsewhere?"     no 6. CAUSE:  "What do you think is causing the back pain?"      Not sure 7. BACK OVERUSE:  "Any recent lifting of heavy objects, strenuous work or exercise?"     no 8. MEDICATIONS: "What have you taken so far for the pain?" (e.g., nothing, acetaminophen, NSAIDS)  no 9. NEUROLOGIC SYMPTOMS: "Do you have any weakness, numbness, or problems with bowel/bladder control?"     Weakness, problems with bladder control  10. OTHER SYMPTOMS: "Do you have any other symptoms?" (e.g., fever, abdominal pain, burning with urination, blood in urine)       No  11. PREGNANCY: "Is there any chance you are pregnant?" (e.g., yes, no; LMP)       no  Protocols used: BACK PAIN-A-AH, DIZZINESS - VERTIGO-A-AH

## 2017-10-10 ENCOUNTER — Encounter: Payer: Self-pay | Admitting: Family Medicine

## 2017-10-10 ENCOUNTER — Ambulatory Visit (INDEPENDENT_AMBULATORY_CARE_PROVIDER_SITE_OTHER): Payer: PPO | Admitting: Family Medicine

## 2017-10-10 VITALS — BP 110/62 | HR 51 | Temp 97.7°F | Ht 63.0 in | Wt 157.0 lb

## 2017-10-10 DIAGNOSIS — R1030 Lower abdominal pain, unspecified: Secondary | ICD-10-CM | POA: Insufficient documentation

## 2017-10-10 DIAGNOSIS — R42 Dizziness and giddiness: Secondary | ICD-10-CM | POA: Diagnosis not present

## 2017-10-10 DIAGNOSIS — M7062 Trochanteric bursitis, left hip: Secondary | ICD-10-CM | POA: Diagnosis not present

## 2017-10-10 MED ORDER — MECLIZINE HCL 25 MG PO TABS: 12.5000 mg | ORAL_TABLET | Freq: Two times a day (BID) | ORAL | 0 refills | Status: DC | PRN

## 2017-10-10 NOTE — Progress Notes (Addendum)
BP 110/62 (BP Location: Left Arm, Patient Position: Sitting, Cuff Size: Normal)   Pulse (!) 51   Temp 97.7 F (36.5 C) (Oral)   Ht '5\' 3"'  (1.6 m)   Wt 157 lb (71.2 kg)   SpO2 99%   BMI 27.81 kg/m    CC: not feeling well Subjective:    Patient ID: Meredith Phillips, female    DOB: 04-26-1946, 72 y.o.   MRN: [id number]  HPI: Meredith Phillips is a 72 y.o. female presenting on 10/10/2017 for Fatigue; Dizziness; and Back Pain   Husband drove her here today.   1-2 wk h/o dizziness that lasted all week. Last week symptoms worsened, today feels even worse - room spinning. Nothing aggravates symptoms, nothing improves symptoms. Headache at vertex of head. Some memory changes - has been referred to neurology but hasn't seen yet.   Also having 2d h/o R lower abd pain. States she has h/o hip problems saw therapist with benefit. Endorses worsening urinary urgency, frequency. No dysuria, hematuria.   Grief since daughter died last year - sees counselor, on zoloft 175m daily.   B12 deficiency s/p weekly injections over the past month.   Denies fevers/chills, vision changes, slurred speech, unilateral weakness or numbness or paresthesias.   Some trouble obtaining history from this patient.  Cardiovascular risk factors include DM, HTN, HLD.  Non smoker.   Relevant past medical, surgical, family and social history reviewed and updated as indicated. Interim medical history since our last visit reviewed. Allergies and medications reviewed and updated. Outpatient Medications Prior to Visit  Medication Sig Dispense Refill  . amLODipine (NORVASC) 10 MG tablet Take 1 tablet (10 mg total) by mouth daily. 90 tablet 1  . aspirin EC 81 MG tablet Take 81 mg by mouth daily.    . Blood Glucose Monitoring Suppl (ONE TOUCH ULTRA 2) w/Device KIT Use to check blood sugars twice a day Dx E11.9 1 each 0  . carvedilol (COREG) 3.125 MG tablet Take 1 tablet (3.125 mg total) by mouth 2 (two) times daily with a meal.  180 tablet 1  . Cholecalciferol (VITAMIN D-3) 1000 UNITS CAPS Take 1,000 Units by mouth.    . clobetasol cream (TEMOVATE) 05.36% APPLY 1 APPLICATION 2 TIMES DAILY. DO NOT USE FOR MORE THAN TWO WEEKS AT ONE TIME 30 g 0  . cloNIDine (CATAPRES) 0.1 MG tablet Take 1 tablet (0.1 mg total) by mouth 2 (two) times daily. Start with Clonidine at night as directed; increase to twice a day when directed 60 tablet 1  . empagliflozin (JARDIANCE) 25 MG TABS tablet Take 25 mg by mouth daily. 30 tablet 1  . esomeprazole (NEXIUM) 40 MG capsule Take 1 capsule (40 mg total) by mouth daily at 12 noon. 30 capsule 0  . fluticasone (FLONASE) 50 MCG/ACT nasal spray Place 2 sprays into both nostrils daily. 16 g 6  . gabapentin (NEURONTIN) 300 MG capsule TAKE 1 CAPSULE BY MOUTH THREE TIMES A DAY 90 capsule 0  . glucose blood (ONE TOUCH ULTRA TEST) test strip Use to check blood sugars twice a day Dx e11.9 100 each 1  . KLOR-CON M20 20 MEQ tablet TAKE 20 MEQ BY MOUTH DAILY.  2  . Lancets (ONETOUCH ULTRASOFT) lancets Use to help check blood sugars twice a day E11.9 100 each 1  . losartan (COZAAR) 100 MG tablet TAKE 1 TABLET BY MOUTH EVERY DAY 90 tablet 1  . sertraline (ZOLOFT) 100 MG tablet Take 1 tablet (100 mg  total) by mouth daily. 90 tablet 1  . simvastatin (ZOCOR) 20 MG tablet Take 1 tablet (20 mg total) by mouth daily. 30 tablet 1  . Potassium Chloride ER 20 MEQ TBCR Take 20 mEq by mouth daily. 30 tablet 2   No facility-administered medications prior to visit.      Per HPI unless specifically indicated in ROS section below Review of Systems     Objective:    BP 110/62 (BP Location: Left Arm, Patient Position: Sitting, Cuff Size: Normal)   Pulse (!) 51   Temp 97.7 F (36.5 C) (Oral)   Ht '5\' 3"'  (1.6 m)   Wt 157 lb (71.2 kg)   SpO2 99%   BMI 27.81 kg/m   Wt Readings from Last 3 Encounters:  10/10/17 157 lb (71.2 kg)  09/09/17 150 lb 0.6 oz (68.1 kg)  08/28/17 149 lb (67.6 kg)    Physical Exam    Constitutional: She appears well-developed and well-nourished. No distress.  HENT:  Head: Normocephalic and atraumatic.  Right Ear: External ear normal.  Left Ear: External ear normal.  Mouth/Throat: Oropharynx is clear and moist. No oropharyngeal exudate.  Eyes: Pupils are equal, round, and reactive to light. Conjunctivae and EOM are normal. No scleral icterus.  Neck: Normal range of motion. Neck supple. No thyromegaly present.  Cardiovascular: Normal rate, regular rhythm, normal heart sounds and intact distal pulses.  No murmur heard. Pulmonary/Chest: Effort normal and breath sounds normal. No respiratory distress. She has no wheezes. She has no rales.  Abdominal: Soft. Bowel sounds are normal. She exhibits no mass. There is no hepatosplenomegaly. There is tenderness in the right lower quadrant and left lower quadrant. There is no guarding, no CVA tenderness and negative Murphy's sign.  Musculoskeletal: She exhibits no edema.  No pain with int/ext rotation at hip. Tender at R>L GTB  Lymphadenopathy:    She has no cervical adenopathy.  Neurological: She is alert. She has normal strength. No cranial nerve deficit. Coordination and gait normal.  CN 2-12 intact FTN intact EOMI No nystagmus No evident vertigo on exam  Skin: Skin is warm and dry. No rash noted.  Psychiatric: She has a normal mood and affect.  Nursing note and vitals reviewed.  Results for orders placed or performed in visit on 47/09/62  Basic Metabolic Panel (BMET)  Result Value Ref Range   Sodium 139 135 - 145 mEq/L   Potassium 4.5 3.5 - 5.1 mEq/L   Chloride 105 96 - 112 mEq/L   CO2 28 19 - 32 mEq/L   Glucose, Bld 123 (H) 70 - 99 mg/dL   BUN 16 6 - 23 mg/dL   Creatinine, Ser 0.91 0.40 - 1.20 mg/dL   Calcium 10.1 8.4 - 10.5 mg/dL   GFR 77.67 >60.00 mL/min   Lab Results  Component Value Date   HGBA1C 5.7 08/28/2017       Assessment & Plan:   Problem List Items Addressed This Visit    Lower abdominal pain     Bilateral lower quadrants with urinary symptoms, benign exam. Check UA today. Treat accordingly.   ADDENDUM ==> patient was unable to leave sample. We asked her to contact clinic on Monday to return for urine sample. Which has already been ordered.      Relevant Orders   Urinalysis Dipstick   Trochanteric bursitis of left hip    Anticipate left greater trochanteric bursitis - rec gentle stretching. Consider steroid injection if not improving. No signs of hip arthritis on  exam today.       Vertigo - Primary    Multiple complaints today. Describes vertigo but not appreciated on exam today. Benign neurological exam. ?peripheral vertigo - Rx meclizine. F/u with PCP this week if not improving. Red flags to seek ER care reviewed - if persistent or worsening, low threshold to further eval for central cause such as stroke given risk factors. Pt agrees with plan.           Meds ordered this encounter  Medications  . meclizine (ANTIVERT) 25 MG tablet    Sig: Take 0.5-1 tablets (12.5-25 mg total) by mouth 2 (two) times daily as needed for dizziness.    Dispense:  30 tablet    Refill:  0   Orders Placed This Encounter  Procedures  . Urinalysis Dipstick    Unable to provide sample at Saturday clinic - asked to return Monday for urine sample.    Standing Status:   Future    Standing Expiration Date:   11/09/2017    Follow up plan: No follow-ups on file.  Ria Bush, MD

## 2017-10-10 NOTE — Addendum Note (Signed)
Addended by: Eustaquio Boyden on: 10/10/2017 05:40 PM   Modules accepted: Orders

## 2017-10-10 NOTE — Assessment & Plan Note (Signed)
Anticipate left greater trochanteric bursitis - rec gentle stretching. Consider steroid injection if not improving. No signs of hip arthritis on exam today.

## 2017-10-10 NOTE — Assessment & Plan Note (Addendum)
Bilateral lower quadrants with urinary symptoms, benign exam. Check UA today. Treat accordingly.   ADDENDUM ==> patient was unable to leave sample. We asked her to contact clinic on Monday to return for urine sample. Which has already been ordered.

## 2017-10-10 NOTE — Patient Instructions (Addendum)
I think you have bursitis of the hip. Gentle stretching of hip daily. Let us know if not improving.  Urinalysis today to check for infection.  I'm not sure where this dizziness/vertigo is coming from. May try meclizine for this. If worsening or persistent, please go to the ER.  If not better with above, call next week to follow up with Ashleigh.   Hip Bursitis Hip bursitis is swelling of a fluid-filled sac (bursa) in your hip. This swelling (inflammation) can be painful. This condition may come and go over time. Follow these instructions at home: Medicines  Take over-the-counter and prescription medicines only as told by your doctor.  Do not drive or use heavy machinery while taking prescription pain medicine, or as told by your doctor.  If you were prescribed an antibiotic medicine, take it as told by your doctor. Do not stop taking the antibiotic even if you start to feel better. Activity  Return to your normal activities as told by your doctor. Ask your doctor what activities are safe for you.  Rest and protect your hip until you feel better. General instructions  Wear wraps that put pressure on your hip (compression wraps) only as told by your doctor.  Raise (elevate) your hip above the level of your heart as much as you can. To do this, try putting a pillow under your hips while you lie down. Stop if this causes pain.  Do not use your hip to support your body weight until your doctor says that you can.  Use crutches as told by your doctor.  Gently rub and stretch your injured area as often as is comfortable.  Keep all follow-up visits as told by your doctor. This is important. How is this prevented?  Exercise regularly, as told by your doctor.  Warm up and stretch before being active.  Cool down and stretch after being active.  Avoid activities that bother your hip or cause pain.  Avoid sitting down for long periods at a time. Contact a doctor if:  You have a  fever.  You get new symptoms.  You have trouble walking.  You have trouble doing everyday activities.  You have pain that gets worse.  You have pain that does not get better with medicine.  You get red skin on your hip area.  You get a feeling of warmth in your hip area. Get help right away if:  You cannot move your hip.  You have very bad pain. This information is not intended to replace advice given to you by your health care provider. Make sure you discuss any questions you have with your health care provider. Document Released: 07/05/2010 Document Revised: 11/08/2015 Document Reviewed: 01/02/2015 Elsevier Interactive Patient Education  [employer].

## 2017-10-10 NOTE — Assessment & Plan Note (Signed)
Multiple complaints today. Describes vertigo but not appreciated on exam today. Benign neurological exam. ?peripheral vertigo - Rx meclizine. F/u with PCP this week if not improving. Red flags to seek ER care reviewed - if persistent or worsening, low threshold to further eval for central cause such as stroke given risk factors. Pt agrees with plan.

## 2017-10-30 ENCOUNTER — Ambulatory Visit: Payer: Self-pay

## 2017-11-02 ENCOUNTER — Other Ambulatory Visit (INDEPENDENT_AMBULATORY_CARE_PROVIDER_SITE_OTHER): Payer: PPO

## 2017-11-02 ENCOUNTER — Ambulatory Visit (INDEPENDENT_AMBULATORY_CARE_PROVIDER_SITE_OTHER): Payer: PPO | Admitting: Nurse Practitioner

## 2017-11-02 ENCOUNTER — Other Ambulatory Visit: Payer: Self-pay

## 2017-11-02 ENCOUNTER — Encounter: Payer: Self-pay | Admitting: Nurse Practitioner

## 2017-11-02 VITALS — BP 132/72 | HR 68 | Temp 98.3°F | Resp 16 | Ht 63.0 in | Wt 156.0 lb

## 2017-11-02 DIAGNOSIS — R1030 Lower abdominal pain, unspecified: Secondary | ICD-10-CM

## 2017-11-02 DIAGNOSIS — R413 Other amnesia: Secondary | ICD-10-CM | POA: Diagnosis not present

## 2017-11-02 DIAGNOSIS — R32 Unspecified urinary incontinence: Secondary | ICD-10-CM

## 2017-11-02 DIAGNOSIS — I10 Essential (primary) hypertension: Secondary | ICD-10-CM

## 2017-11-02 DIAGNOSIS — M25512 Pain in left shoulder: Secondary | ICD-10-CM

## 2017-11-02 DIAGNOSIS — E119 Type 2 diabetes mellitus without complications: Secondary | ICD-10-CM

## 2017-11-02 DIAGNOSIS — E538 Deficiency of other specified B group vitamins: Secondary | ICD-10-CM | POA: Diagnosis not present

## 2017-11-02 DIAGNOSIS — M25511 Pain in right shoulder: Secondary | ICD-10-CM

## 2017-11-02 LAB — COMPREHENSIVE METABOLIC PANEL
ALBUMIN: 3.8 g/dL (ref 3.5–5.2)
ALK PHOS: 55 U/L (ref 39–117)
ALT: 8 U/L (ref 0–35)
AST: 12 U/L (ref 0–37)
BUN: 14 mg/dL (ref 6–23)
CO2: 28 mEq/L (ref 19–32)
Calcium: 9.6 mg/dL (ref 8.4–10.5)
Chloride: 105 mEq/L (ref 96–112)
Creatinine, Ser: 1.03 mg/dL (ref 0.40–1.20)
GFR: 67.3 mL/min (ref >60.00)
Glucose, Bld: 119 mg/dL — ABNORMAL HIGH (ref 70–99)
POTASSIUM: 4.5 meq/L (ref 3.5–5.1)
Sodium: 139 mEq/L (ref 135–145)
TOTAL PROTEIN: 7.4 g/dL (ref 6.0–8.3)
Total Bilirubin: 0.4 mg/dL (ref 0.2–1.2)

## 2017-11-02 LAB — URINALYSIS, ROUTINE W REFLEX MICROSCOPIC
Bilirubin Urine: NEGATIVE
KETONES UR: NEGATIVE
Nitrite: NEGATIVE
PH: 6 (ref 5.0–8.0)
SPECIFIC GRAVITY, URINE: 1.015 (ref 1.000–1.030)
Total Protein, Urine: 100 — AB
Urine Glucose: >=1000 — AB
Urobilinogen, UA: 0.2 (ref 0.0–1.0)

## 2017-11-02 LAB — VITAMIN B12: VITAMIN B 12: 609 pg/mL (ref 211–911)

## 2017-11-02 LAB — HEMOGLOBIN A1C: HEMOGLOBIN A1C: 6 % (ref 4.6–6.5)

## 2017-11-02 MED ORDER — SULFAMETHOXAZOLE-TRIMETHOPRIM 800-160 MG PO TABS: 1.0000 | ORAL_TABLET | Freq: Two times a day (BID) | ORAL | 0 refills | Status: DC

## 2017-11-02 NOTE — Assessment & Plan Note (Signed)
Stable, continue current meds - Comprehensive metabolic panel; Future

## 2017-11-02 NOTE — Assessment & Plan Note (Signed)
Switched to jardiance after intolerance to metformin around mid March Update labs today - Comprehensive metabolic panel; Future - Hemoglobin A1c; Future

## 2017-11-02 NOTE — Patient Instructions (Addendum)
Please head downstairs for lab work/x-rays. If any of your test results are critically abnormal, you will be contacted right away. Your results may be released to your MyChart for viewing before I am able to provide you with my response. I will contact you within a week about your test results and any recommendations for abnormalities.  Please start bactrim 1 tablet twice daily for 3 days for your urine.  Please keep your scheduled upcoming neurology appointment.

## 2017-11-02 NOTE — Assessment & Plan Note (Signed)
Diagnosed with B12 deficiency and started on B12 injections mid March Update b12 level today - Vitamin B12; Future

## 2017-11-02 NOTE — Progress Notes (Signed)
Name: Meredith Phillips   MRN: [id number]    DOB: [birth year]   Date:11/02/2017       Progress Note  Subjective  Chief Complaint  Chief Complaint  Patient presents with  . Fall    Shoulder pain, used hands to catch her fall, x2 weeks ago    HPI Meredith Phillips is coming in today with her daughter for evaluation of shoulder pain and urinary incontinence. She was recently evaluated by another provider in our office for memory changes on 3/27 with upcoming neurology appointment scheduled on 5/31. She is a poor historian, giving a disconnected history today.  Urinary incontinence-  This is a new problem She has been incontinent of urine for about 1-2 weeks now. She says that before she can make it to the toilet "the urine just comes out." She denies fevers, chills, abdominal pain, flank pain, dysuria, hematuria, constipation or diarrhea.  Shoulder pain This is a new problem She has been experiencing bilateral shoulder pain since a fall about 2 weeks ago. She says she fell over over a box onto a carpeted floor with her arms outstretched to catch her fall. She denies head injury, loss of consciousness, dizziness, vision changes, nausea, vomiting. She says her shoulder pain seems to be getting better and she denies weakness, radiation, numbness, tingling, bruising, erythema, edema. She has chronic headaches that have not seemed different recently. She was actually seen for vertigo on 4/27 and given meclizine which she says has greatly improved her dizziness, but admits she often forgets to take the meclizine, she says she "just can not remember anything." History of bilateral rotator cuff surgery  Patient Active Problem List   Diagnosis Date Noted  . Vertigo 10/10/2017  . Trochanteric bursitis of left hip 10/10/2017  . Lower abdominal pain 10/10/2017  . Murmur, cardiac 04/15/2017  . Asymptomatic postmenopausal estrogen deficiency 04/15/2017  . Right-sided low back pain without sciatica  02/10/2017  . Dark stools 12/23/2016  . Grief at loss of child 07/29/2016  . Allergic rhinitis 02/27/2016  . Rash and nonspecific skin eruption 11/13/2015  . Lightheadedness 08/21/2015  . GERD (gastroesophageal reflux disease) 08/06/2015  . Baker's cyst, ruptured 08/03/2015  . Hip pain 06/29/2015  . Type 2 diabetes mellitus ([place]) 06/29/2015  . Arthritis of left knee 06/15/2015  . Left knee pain 06/09/2015  . Pain of left side of body 02/22/2015  . Benign essential HTN 02/18/2015  . Cellulitis 02/18/2015  . Sciatica 01/17/2015  . Routine general medical examination at a health care facility 12/26/2014  . Medicare annual wellness visit, subsequent 12/26/2014    Social History   Tobacco Use  . Smoking status: Never Smoker  . Smokeless tobacco: Never Used  Substance Use Topics  . Alcohol use: No     Current Outpatient Medications:  .  amLODipine (NORVASC) 10 MG tablet, Take 1 tablet (10 mg total) by mouth daily., Disp: 90 tablet, Rfl: 1 .  aspirin EC 81 MG tablet, Take 81 mg by mouth daily., Disp: , Rfl:  .  Blood Glucose Monitoring Suppl (ONE TOUCH ULTRA 2) w/Device KIT, Use to check blood sugars twice a day Dx E11.9, Disp: 1 each, Rfl: 0 .  carvedilol (COREG) 3.125 MG tablet, Take 1 tablet (3.125 mg total) by mouth 2 (two) times daily with a meal., Disp: 180 tablet, Rfl: 1 .  Cholecalciferol (VITAMIN D-3) 1000 UNITS CAPS, Take 1,000 Units by mouth., Disp: , Rfl:  .  clobetasol cream (TEMOVATE) 3.53 %, APPLY 1 APPLICATION  2 TIMES DAILY. DO NOT USE FOR MORE THAN TWO WEEKS AT ONE TIME, Disp: 30 g, Rfl: 0 .  cloNIDine (CATAPRES) 0.1 MG tablet, Take 1 tablet (0.1 mg total) by mouth 2 (two) times daily. Start with Clonidine at night as directed; increase to twice a day when directed, Disp: 60 tablet, Rfl: 1 .  empagliflozin (JARDIANCE) 25 MG TABS tablet, Take 25 mg by mouth daily., Disp: 30 tablet, Rfl: 1 .  esomeprazole (NEXIUM) 40 MG capsule, Take 1 capsule (40 mg total) by mouth  daily at 12 noon., Disp: 30 capsule, Rfl: 0 .  fluticasone (FLONASE) 50 MCG/ACT nasal spray, Place 2 sprays into both nostrils daily., Disp: 16 g, Rfl: 6 .  gabapentin (NEURONTIN) 300 MG capsule, TAKE 1 CAPSULE BY MOUTH THREE TIMES A DAY, Disp: 90 capsule, Rfl: 0 .  glucose blood (ONE TOUCH ULTRA TEST) test strip, Use to check blood sugars twice a day Dx e11.9, Disp: 100 each, Rfl: 1 .  KLOR-CON M20 20 MEQ tablet, TAKE 20 MEQ BY MOUTH DAILY., Disp: , Rfl: 2 .  Lancets (ONETOUCH ULTRASOFT) lancets, Use to help check blood sugars twice a day E11.9, Disp: 100 each, Rfl: 1 .  losartan (COZAAR) 100 MG tablet, TAKE 1 TABLET BY MOUTH EVERY DAY, Disp: 90 tablet, Rfl: 1 .  meclizine (ANTIVERT) 25 MG tablet, Take 0.5-1 tablets (12.5-25 mg total) by mouth 2 (two) times daily as needed for dizziness., Disp: 30 tablet, Rfl: 0 .  sertraline (ZOLOFT) 100 MG tablet, Take 1 tablet (100 mg total) by mouth daily., Disp: 90 tablet, Rfl: 1 .  simvastatin (ZOCOR) 20 MG tablet, Take 1 tablet (20 mg total) by mouth daily., Disp: 30 tablet, Rfl: 1 .  Potassium Chloride ER 20 MEQ TBCR, Take 20 mEq by mouth daily., Disp: 30 tablet, Rfl: 2  Allergies  Allergen Reactions  . Codeine Other (See Comments)    Woke up the next morning and did not know where she was  . Other     Doesn't like to take pain medication due to hallucinations.  . Shellfish Allergy     ROS  No other specific complaints in a complete review of systems (except as listed in HPI above).  Objective  Vitals:   11/02/17 1029  BP: 132/72  Pulse: 68  Resp: 16  Temp: 98.3 F (36.8 C)  TempSrc: Oral  SpO2: 97%  Weight: 156 lb (70.8 kg)  Height: _0  (1.6 m)    Body mass index is 27.63 kg/m.  Nursing Note and Vital Signs reviewed.  Physical Exam Vital signs reviewed Constitutional: Patient appears well-developed and well-nourished. No distress.  HEENT: head atraumatic, normocephalic, pupils equal and reactive to light, EOM's intact,   neck supple, oropharynx pink and moist without exudate Cardiovascular: Normal rate, regular rhythm, S1/S2 present.  No BLE edema. Distal pulses intact Pulmonary/Chest: Effort normal and breath sounds clear. No respiratory distress or retractions. Musculoskeletal: she exhibits normal range of motion, no gross deformities. Bilateral shoulders without tenderness, swelling, deformity, or decreased strength. Skin: warm and dry. No rash noted. No erythema. Abdominal: Soft and non-tender, bowel sounds present x4 quadrants.  No CVA Tenderness. Psychiatric: Patient has a normal mood and affect. behavior is normal. Abnormal memory.  Assessment & Plan F/U TBD pending lab results Will go ahead and update routine/upcoming labs while she is here today for convenience- CMET, A1c, B12 -Red flags and when to present for emergency care or RTC including fever >101.47F, chest pain, shortness of breath,  new/worsening/un-resolving symptoms, reviewed at time of visit.   Acute pain of both shoulders Improving per patient report Normal PE today F/U for new, worsening symptoms or if pain does not completely resolve  Urinary incontinence, unspecified type Will initiate Tx for UTI today based on acute symptoms- rx for bactrim given with dosing and side effects discussed  -urinalysis was collected prior to appointment and urine culture was verbally added after her OV Will f/u with further recommendations pending lab results - sulfamethoxazole-trimethoprim (BACTRIM DS,SEPTRA DS) 800-160 MG tablet; Take 1 tablet by mouth 2 (two) times daily.  Dispense: 6 tablet; Refill: 0  Memory loss Apparent again during OV today Expressed importance of upcoming neurology appointment for further evaluation of memory changes, dizziness - she has apparently cancelled past neurology appointments for this same issue

## 2017-11-03 LAB — URINE CULTURE
MICRO NUMBER:: 90610266
SPECIMEN QUALITY:: ADEQUATE

## 2017-11-05 ENCOUNTER — Other Ambulatory Visit: Payer: Self-pay | Admitting: Family

## 2017-11-08 ENCOUNTER — Other Ambulatory Visit: Payer: Self-pay | Admitting: Family

## 2017-11-13 ENCOUNTER — Encounter: Payer: Self-pay | Admitting: Diagnostic Neuroimaging

## 2017-11-13 ENCOUNTER — Ambulatory Visit (INDEPENDENT_AMBULATORY_CARE_PROVIDER_SITE_OTHER): Payer: PPO | Admitting: Diagnostic Neuroimaging

## 2017-11-13 VITALS — BP 144/84 | HR 61 | Ht 63.0 in | Wt 158.0 lb

## 2017-11-13 DIAGNOSIS — F0390 Unspecified dementia without behavioral disturbance: Secondary | ICD-10-CM | POA: Diagnosis not present

## 2017-11-13 DIAGNOSIS — R51 Headache: Secondary | ICD-10-CM | POA: Diagnosis not present

## 2017-11-13 DIAGNOSIS — R519 Headache, unspecified: Secondary | ICD-10-CM

## 2017-11-13 DIAGNOSIS — R413 Other amnesia: Secondary | ICD-10-CM | POA: Diagnosis not present

## 2017-11-13 DIAGNOSIS — F03B0 Unspecified dementia, moderate, without behavioral disturbance, psychotic disturbance, mood disturbance, and anxiety: Secondary | ICD-10-CM

## 2017-11-13 NOTE — Patient Instructions (Signed)
-   check MRI brain  - consider memantine 10mg  at bedtime; increase to twice a day after 1-2 weeks  - safety / supervision issues reviewed  - no driving; caution with finances and medications  - caregiver resources provided
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
## 2017-11-14 ENCOUNTER — Other Ambulatory Visit: Payer: Self-pay | Admitting: Family

## 2017-11-14 DIAGNOSIS — I10 Essential (primary) hypertension: Secondary | ICD-10-CM

## 2017-11-14 LAB — C-REACTIVE PROTEIN: CRP: 3.1 mg/L (ref 0.0–4.9)

## 2017-11-14 LAB — SEDIMENTATION RATE: Sed Rate: 23 mm/hr (ref 0–40)

## 2017-11-16 ENCOUNTER — Telehealth: Payer: Self-pay | Admitting: *Deleted

## 2017-11-16 ENCOUNTER — Telehealth: Payer: Self-pay | Admitting: Diagnostic Neuroimaging

## 2017-11-16 NOTE — Telephone Encounter (Signed)
Health team auth: [employer] per their website order sent to GI. They will reach out to the pt to schedule.  °

## 2017-11-16 NOTE — Telephone Encounter (Signed)
Spoke with patient and informed her that her labs came back normal.  She verbalized understanding, appreciation.

## 2017-11-28 ENCOUNTER — Other Ambulatory Visit: Payer: Self-pay | Admitting: Family

## 2017-12-06 ENCOUNTER — Other Ambulatory Visit: Payer: Self-pay | Admitting: Nurse Practitioner

## 2017-12-16 ENCOUNTER — Ambulatory Visit (INDEPENDENT_AMBULATORY_CARE_PROVIDER_SITE_OTHER): Payer: PPO | Admitting: Nurse Practitioner

## 2017-12-16 ENCOUNTER — Other Ambulatory Visit: Payer: PPO

## 2017-12-16 ENCOUNTER — Encounter: Payer: Self-pay | Admitting: Nurse Practitioner

## 2017-12-16 ENCOUNTER — Ambulatory Visit (INDEPENDENT_AMBULATORY_CARE_PROVIDER_SITE_OTHER)
Admission: RE | Admit: 2017-12-16 | Discharge: 2017-12-16 | Disposition: A | Discharge status: Home / Self Care | Payer: PPO | Source: Ambulatory Visit | Attending: Nurse Practitioner | Admitting: Nurse Practitioner

## 2017-12-16 VITALS — BP 160/86 | HR 73 | Temp 98.3°F | Resp 16 | Ht 63.0 in | Wt 159.8 lb

## 2017-12-16 DIAGNOSIS — W19XXXD Unspecified fall, subsequent encounter: Secondary | ICD-10-CM | POA: Diagnosis not present

## 2017-12-16 DIAGNOSIS — F329 Major depressive disorder, single episode, unspecified: Secondary | ICD-10-CM | POA: Diagnosis not present

## 2017-12-16 DIAGNOSIS — I10 Essential (primary) hypertension: Secondary | ICD-10-CM

## 2017-12-16 DIAGNOSIS — F419 Anxiety disorder, unspecified: Secondary | ICD-10-CM | POA: Diagnosis not present

## 2017-12-16 DIAGNOSIS — M25511 Pain in right shoulder: Secondary | ICD-10-CM

## 2017-12-16 DIAGNOSIS — R413 Other amnesia: Secondary | ICD-10-CM

## 2017-12-16 NOTE — Progress Notes (Signed)
Name: Meredith Phillips   MRN: [id number]    DOB: 1945/11/12   Date:12/16/2017       Progress Note  Subjective  Chief Complaint  Chief Complaint  Patient presents with  . Shoulder Pain    had a fall a month ago, has right shoulder pain and head pain     HPI Meredith Phillips is here today for evaluation of head and right shoulder pain, both related to recent falls. I last saw her on 11/02/17 for right shoulder pain after a fall, and since she reported improving pain that day no further testing was ordered. Since our last visit on 5/20, she has established with Dr Leta Baptist, neurology, for evaluation of moderate dementia without behavioral disturbance with upcoming MRI scheduled. Today, she actually reports multiple falls, at least 3 that she can recall since our last visit. She tells me that she always falls in her house on the carpeted floor. She lives at home with husband and grandson, usually never home alone, but continues to drive. She c/o today of frontal head pain- an aching pain- which has been occurring daily since one of her falls, she is not sure which fall, and right shoulder pain- aching- that radiates to her right neck and down to right elbow. She denies loss of consciousness, vision changes, nausea, vomiting, erythema, ecchymosis, numbness, tingling. She also mentions that she ran out of her zoloft, at least a week ago or longer, which she had been on since last year after her daughter died. She says she is feeling okay without the zoloft and is not as sad or depressed as she was when her daughter first died. She would prefer not to resume the zoloft now.    Patient Active Problem List   Diagnosis Date Noted  . Memory loss 11/02/2017  . B12 deficiency 11/02/2017  . Vertigo 10/10/2017  . Trochanteric bursitis of left hip 10/10/2017  . Murmur, cardiac 04/15/2017  . Asymptomatic postmenopausal estrogen deficiency 04/15/2017  . Right-sided low back pain without sciatica 02/10/2017  . Dark  stools 12/23/2016  . Grief at loss of child 07/29/2016  . Allergic rhinitis 02/27/2016  . GERD (gastroesophageal reflux disease) 08/06/2015  . Baker's cyst, ruptured 08/03/2015  . Type 2 diabetes mellitus ([place]) 06/29/2015  . Arthritis of left knee 06/15/2015  . Benign essential HTN 02/18/2015  . Cellulitis 02/18/2015  . Sciatica 01/17/2015  . Routine general medical examination at a health care facility 12/26/2014  . Medicare annual wellness visit, subsequent 12/26/2014    Social History   Tobacco Use  . Smoking status: Never Smoker  . Smokeless tobacco: Never Used  Substance Use Topics  . Alcohol use: No     Current Outpatient Medications:  .  amLODipine (NORVASC) 10 MG tablet, Take 1 tablet (10 mg total) by mouth daily., Disp: 90 tablet, Rfl: 1 .  aspirin EC 81 MG tablet, Take 81 mg by mouth daily., Disp: , Rfl:  .  Blood Glucose Monitoring Suppl (ONE TOUCH ULTRA 2) w/Device KIT, Use to check blood sugars twice a day Dx E11.9, Disp: 1 each, Rfl: 0 .  carvedilol (COREG) 3.125 MG tablet, Take 1 tablet (3.125 mg total) by mouth 2 (two) times daily with a meal., Disp: 180 tablet, Rfl: 1 .  Cholecalciferol (VITAMIN D-3) 1000 UNITS CAPS, Take 1,000 Units by mouth., Disp: , Rfl:  .  clobetasol cream (TEMOVATE) 4.09 %, APPLY 1 APPLICATION 2 TIMES DAILY. DO NOT USE FOR MORE THAN TWO WEEKS AT ONE  TIME, Disp: 30 g, Rfl: 0 .  cloNIDine (CATAPRES) 0.1 MG tablet, PLEASE SEE ATTACHED FOR DETAILED DIRECTIONS, Disp: 60 tablet, Rfl: 1 .  esomeprazole (NEXIUM) 40 MG capsule, Take 1 capsule (40 mg total) by mouth daily at 12 noon., Disp: 30 capsule, Rfl: 0 .  fluticasone (FLONASE) 50 MCG/ACT nasal spray, Place 2 sprays into both nostrils daily., Disp: 16 g, Rfl: 6 .  gabapentin (NEURONTIN) 300 MG capsule, TAKE 1 CAPSULE BY MOUTH THREE TIMES A DAY, Disp: 270 capsule, Rfl: 1 .  glucose blood (ONE TOUCH ULTRA TEST) test strip, USE TO CHECK BLOOD SUGARS TWICE A DAY DX E11.9, Disp: 100 each, Rfl: 1 .   JARDIANCE 25 MG TABS tablet, TAKE 1 TABLET BY MOUTH EVERY DAY, Disp: 30 tablet, Rfl: 1 .  KLOR-CON M20 20 MEQ tablet, TAKE 20 MEQ BY MOUTH DAILY., Disp: , Rfl: 2 .  Lancets (ONETOUCH ULTRASOFT) lancets, USE TO HELP CHECK BLOOD SUGARS TWICE A DAY E11.9, Disp: 100 each, Rfl: 1 .  losartan (COZAAR) 100 MG tablet, TAKE 1 TABLET BY MOUTH EVERY DAY, Disp: 90 tablet, Rfl: 1 .  meclizine (ANTIVERT) 25 MG tablet, Take 0.5-1 tablets (12.5-25 mg total) by mouth 2 (two) times daily as needed for dizziness., Disp: 30 tablet, Rfl: 0 .  sertraline (ZOLOFT) 100 MG tablet, Take 1 tablet (100 mg total) by mouth daily., Disp: 90 tablet, Rfl: 1 .  simvastatin (ZOCOR) 20 MG tablet, TAKE 1 TABLET BY MOUTH EVERY DAY, Disp: 30 tablet, Rfl: 1 .  sulfamethoxazole-trimethoprim (BACTRIM DS,SEPTRA DS) 800-160 MG tablet, Take 1 tablet by mouth 2 (two) times daily., Disp: 6 tablet, Rfl: 0  Allergies  Allergen Reactions  . Codeine Other (See Comments)    Woke up the next morning and did not know where she was  . Other     Doesn't like to take pain medication due to hallucinations.  . Shellfish Allergy     ROS  No other specific complaints in a complete review of systems (except as listed in HPI above).  Objective  Vitals:   12/16/17 1254  BP: (!) 160/86  Pulse: 73  Resp: 16  Temp: 98.3 F (36.8 C)  TempSrc: Oral  SpO2: 97%  Weight: 159 lb 12.8 oz (72.5 kg)  Height: _0  (1.6 m)   Body mass index is 28.31 kg/m.  Nursing Note and Vital Signs reviewed.  Physical Exam  Constitutional: Patient appears well-developed and well-nourished.  No distress.  HEENT: head atraumatic, normocephalic, pupils equal and reactive to light, EOM's intact, neck supple, oropharynx pink and moist without exudate Cardiovascular: Normal rate, regular rhythm, S1/S2 present.  Distal pulses intact. Pulmonary/Chest: Effort normal and breath sounds clear. No respiratory distress or retractions. Musculoskeletal: Normal range of  motion, No gross deformities. Right shoulder tenderness, no swelling or discoloration is noted. Neurological: She is alert, oriented to person, place. No cranial nerve deficit. Coordination, balance, strength, speech and gait are normal.  Skin: Skin is warm and dry. No rash noted. No erythema.  Psychiatric: Patient has a normal mood and affect. Recent and remote memory abnormal.  Assessment & Plan RTC around November for routine diabetes follow up.  Right shoulder pain, unspecified chronicity Will obtain xray today F/U with further recommendations pending lab results - DG Shoulder Right; Future  Fall, subsequent encounter Recent labs and diagnostic testing reviewed, no further labwork today We discussed fall prevention and return precautions Continue follow up with neurology for further evaluation of memory loss and upcoming MRI  Memory loss Continue follow up with neurology for further evaluation Here alone for appointment today, apparently she drove. Again, she was instructed not to drive, also noted these instructions in recent neurology note. She is requesting handicap placard, but I can not sign this for her until she brings family member or friend to appointment with her who will verify that they are going to be her responsible driver, which I discussed with her today

## 2017-12-16 NOTE — Patient Instructions (Addendum)
Please head downstairs for XRAYs If any of your test results are critically abnormal, you will be contacted right away. Otherwise, I will contact you within a week about your test results and any recommendations for abnormalities.  Please do not continue to drive. I will fill out your handicap placard at your next office visit if a relative is with you. This is for your safety.   Please keep your scheduled MRI and neurology follow up.  I will plan to see you back around November for your routine diabetes follow up, or sooner if needed.

## 2017-12-16 NOTE — Assessment & Plan Note (Signed)
BP slightly elevated today, with noted memory loss and history of medication non-compliance related to confusion Will continue to monitor

## 2017-12-16 NOTE — Assessment & Plan Note (Signed)
  Stable Will discontinue zoloft from her medication list F/U for new, worsening symptoms

## 2018-01-05 ENCOUNTER — Other Ambulatory Visit: Payer: Self-pay | Admitting: Nurse Practitioner

## 2018-01-09 ENCOUNTER — Other Ambulatory Visit: Payer: Self-pay | Admitting: Nurse Practitioner

## 2018-01-11 ENCOUNTER — Ambulatory Visit (INDEPENDENT_AMBULATORY_CARE_PROVIDER_SITE_OTHER): Payer: PPO | Admitting: Nurse Practitioner

## 2018-01-11 ENCOUNTER — Encounter: Payer: Self-pay | Admitting: Nurse Practitioner

## 2018-01-11 VITALS — BP 142/80 | HR 74 | Temp 98.4°F | Resp 16 | Ht 63.0 in | Wt 164.0 lb

## 2018-01-11 DIAGNOSIS — B351 Tinea unguium: Secondary | ICD-10-CM | POA: Diagnosis not present

## 2018-01-11 DIAGNOSIS — R5382 Chronic fatigue, unspecified: Secondary | ICD-10-CM | POA: Diagnosis not present

## 2018-01-11 NOTE — Progress Notes (Signed)
Name: Meredith Phillips   MRN: [id number]    DOB: 07-10-1945   Date:01/11/2018       Progress Note  Subjective  Chief Complaint  Chief Complaint  Patient presents with  . Fatigue    states that she has been very tired, left big toe turning colors and hurts    HPI Meredith Phillips is here today requesting evaluation two complaints: fatigue and toenail problem. She is accompanied by her daughter to appointment today.  Fatigue- This is not a new problem, but noted to have chronic c/o fatigue in the past She says today her fatigue seemed better after some of her medications were adjusted earlier this year, but has been "so much worse again" for past week or two. She says she slept all day for a few days and still feels tired. She denies falls, syncope, fevers, chest pain, abdominal pain, nausea, vomiting, bowel or bladder changes. She reports eating and drinking okay.  Toenail problem- She is not sure when this problem began  She say she has noticed dark coloration of her left great toenail for some time now, and more recently the toe feels sore She denies erythema, drainage, injury.   Patient Active Problem List   Diagnosis Date Noted  . Anxiety and depression 12/16/2017  . Memory loss 11/02/2017  . B12 deficiency 11/02/2017  . Vertigo 10/10/2017  . Trochanteric bursitis of left hip 10/10/2017  . Murmur, cardiac 04/15/2017  . Asymptomatic postmenopausal estrogen deficiency 04/15/2017  . Right-sided low back pain without sciatica 02/10/2017  . Dark stools 12/23/2016  . Grief at loss of child 07/29/2016  . Allergic rhinitis 02/27/2016  . GERD (gastroesophageal reflux disease) 08/06/2015  . Baker's cyst, ruptured 08/03/2015  . Type 2 diabetes mellitus ([place]) 06/29/2015  . Arthritis of left knee 06/15/2015  . Benign essential HTN 02/18/2015  . Cellulitis 02/18/2015  . Sciatica 01/17/2015  . Routine general medical examination at a health care facility 12/26/2014  . Medicare annual  wellness visit, subsequent 12/26/2014    Social History   Tobacco Use  . Smoking status: Never Smoker  . Smokeless tobacco: Never Used  Substance Use Topics  . Alcohol use: No     Current Outpatient Medications:  .  amLODipine (NORVASC) 10 MG tablet, Take 1 tablet (10 mg total) by mouth daily., Disp: 90 tablet, Rfl: 1 .  aspirin EC 81 MG tablet, Take 81 mg by mouth daily., Disp: , Rfl:  .  Blood Glucose Monitoring Suppl (ONE TOUCH ULTRA 2) w/Device KIT, Use to check blood sugars twice a day Dx E11.9, Disp: 1 each, Rfl: 0 .  carvedilol (COREG) 3.125 MG tablet, Take 1 tablet (3.125 mg total) by mouth 2 (two) times daily with a meal., Disp: 180 tablet, Rfl: 1 .  Cholecalciferol (VITAMIN D-3) 1000 UNITS CAPS, Take 1,000 Units by mouth., Disp: , Rfl:  .  clobetasol cream (TEMOVATE) 4.80 %, APPLY 1 APPLICATION 2 TIMES DAILY. DO NOT USE FOR MORE THAN TWO WEEKS AT ONE TIME, Disp: 30 g, Rfl: 0 .  cloNIDine (CATAPRES) 0.1 MG tablet, PLEASE SEE ATTACHED FOR DETAILED DIRECTIONS, Disp: 60 tablet, Rfl: 1 .  esomeprazole (NEXIUM) 40 MG capsule, Take 1 capsule (40 mg total) by mouth daily at 12 noon., Disp: 30 capsule, Rfl: 0 .  fluticasone (FLONASE) 50 MCG/ACT nasal spray, Place 2 sprays into both nostrils daily., Disp: 16 g, Rfl: 6 .  gabapentin (NEURONTIN) 300 MG capsule, TAKE 1 CAPSULE BY MOUTH THREE TIMES A DAY, Disp:  270 capsule, Rfl: 1 .  glucose blood (ONE TOUCH ULTRA TEST) test strip, USE TO CHECK BLOOD SUGARS TWICE A DAY DX E11.9, Disp: 100 each, Rfl: 1 .  JARDIANCE 25 MG TABS tablet, TAKE 1 TABLET BY MOUTH EVERY DAY, Disp: 30 tablet, Rfl: 1 .  KLOR-CON M20 20 MEQ tablet, TAKE 20 MEQ BY MOUTH DAILY., Disp: , Rfl: 2 .  Lancets (ONETOUCH ULTRASOFT) lancets, USE TO HELP CHECK BLOOD SUGARS TWICE A DAY E11.9, Disp: 100 each, Rfl: 1 .  losartan (COZAAR) 100 MG tablet, TAKE 1 TABLET BY MOUTH EVERY DAY, Disp: 90 tablet, Rfl: 1 .  meclizine (ANTIVERT) 25 MG tablet, Take 0.5-1 tablets (12.5-25 mg total)  by mouth 2 (two) times daily as needed for dizziness., Disp: 30 tablet, Rfl: 0 .  simvastatin (ZOCOR) 20 MG tablet, TAKE 1 TABLET BY MOUTH EVERY DAY, Disp: 30 tablet, Rfl: 1  Allergies  Allergen Reactions  . Codeine Other (See Comments)    Woke up the next morning and did not know where she was  . Other     Doesn't like to take pain medication due to hallucinations.  . Shellfish Allergy     ROS  No other specific complaints in a complete review of systems (except as listed in HPI above).  Objective  Vitals:   01/11/18 0957  BP: (!) 142/80  Pulse: 74  Resp: 16  Temp: 98.4 F (36.9 C)  TempSrc: Oral  SpO2: 97%  Weight: 164 lb (74.4 kg)  Height: _0  (1.6 m)    Body mass index is 29.05 kg/m.   Physical Exam Vital signs reviewed Constitutional: Patient appears well-developed and well-nourished. No distress.  HEENT: head atraumatic, normocephalic, pupils equal and reactive to light, EOM's intact,  neck supple, oropharynx pink and moist without exudate Cardiovascular: Normal rate, regular rhythm, S1/S2 present.  No BLE edema. Distal pulses intact Pulmonary/Chest: Effort normal and breath sounds clear. No respiratory distress or retractions. Abdominal: Soft and non-tender, bowel sounds present x4 quadrants.  No CVA Tenderness. Foot: Left great toenail darkened and discolored. No skin breakdown, warmth or deformity noted. Skin: Warm and dry. No rash noted. No erythema. Psychiatric: Patient has a normal mood and affect. behavior is normal. Speech is repetitive. Abnormal memory.   Assessment & Plan F/U TBD pending lab results TSH WNL 08/28/17 CMET, A1c WNL 11/02/17  1. Chronic fatigue Per chart review, fatigue has been ongoing for some time now, and likely multifactorial in origin. It is hard to get a good history from her due to dementia, memory loss and repetitive statements.  Her blood pressure medications could be contributing to her fatigue but her blood pressure does  not appear to be well controlled, although lower today than on her last visit-we could consider adjusting her BP medications to see if this helped her fatigue depending on her lab results today. Will update labs today and F/U with further recommendations pending lab results - VITAMIN D 25 Hydroxy (Vit-D Deficiency, Fractures); Future - CBC; Future - Vitamin B12; Future - Urinalysis, Routine w reflex microscopic; Future  2. Toenail fungus Discussed referral to podiatry for further E&M and she is agreeable - Ambulatory referral to Podiatry

## 2018-01-11 NOTE — Patient Instructions (Signed)
Please head downstairs for lab work If any of your test results are critically abnormal, you will be contacted right away. Otherwise, I will contact you within a week about your test results and any recommendations for abnormalities.  I have placed a referral to  Podiatry for further evaluation of your toenail. Our office will begin processing this referral. Please follow up if you have not heard anything about this referral within 10 days.

## 2018-01-27 ENCOUNTER — Other Ambulatory Visit: Payer: Self-pay | Admitting: Nurse Practitioner

## 2018-01-29 ENCOUNTER — Ambulatory Visit: Payer: PPO | Admitting: Podiatry

## 2018-01-29 DIAGNOSIS — B351 Tinea unguium: Secondary | ICD-10-CM | POA: Diagnosis not present

## 2018-01-29 DIAGNOSIS — E119 Type 2 diabetes mellitus without complications: Secondary | ICD-10-CM | POA: Diagnosis not present

## 2018-01-29 DIAGNOSIS — E1151 Type 2 diabetes mellitus with diabetic peripheral angiopathy without gangrene: Secondary | ICD-10-CM

## 2018-02-09 ENCOUNTER — Other Ambulatory Visit: Payer: Self-pay | Admitting: *Deleted

## 2018-02-09 DIAGNOSIS — I10 Essential (primary) hypertension: Secondary | ICD-10-CM

## 2018-02-09 MED ORDER — AMLODIPINE BESYLATE 10 MG PO TABS: 10.0000 mg | ORAL_TABLET | Freq: Every day | ORAL | 1 refills | Status: DC

## 2018-02-16 ENCOUNTER — Other Ambulatory Visit: Payer: Self-pay | Admitting: Nurse Practitioner

## 2018-02-21 NOTE — Progress Notes (Signed)
Subjective:  Patient ID: Meredith Phillips, female    DOB: 02-14-1946,  MRN: [id number]  Chief Complaint  Patient presents with  . Nail Problem    toenail fungus left 5th toenail    72 y.o. female presents  for diabetic foot care. Last AMBS was 100. Last saw PCP a month ago. Denies numbness and tingling in their feet. Denies cramping in legs and thighs.  Review of Systems: Negative except as noted in the HPI. Denies N/V/F/Ch.  Past Medical History:  Diagnosis Date  . Arthritis   . Benign essential HTN 02/18/2015  . Cellulitis 02/18/2015  . Chicken pox   . Depression    since last 04/2018 daughter passed.    . Diabetes mellitus without complication ([place])   . Diabetic peripheral neuropathy ([place])   . GERD (gastroesophageal reflux disease)   . Hypercholesteremia   . Hypertension   . Liver disease   . Polyp of colon     Current Outpatient Medications:  .  amLODipine (NORVASC) 10 MG tablet, Take 1 tablet (10 mg total) by mouth daily., Disp: 90 tablet, Rfl: 1 .  aspirin EC 81 MG tablet, Take 81 mg by mouth daily., Disp: , Rfl:  .  Blood Glucose Monitoring Suppl (ONE TOUCH ULTRA 2) w/Device KIT, Use to check blood sugars twice a day Dx E11.9, Disp: 1 each, Rfl: 0 .  carvedilol (COREG) 3.125 MG tablet, Take 1 tablet (3.125 mg total) by mouth 2 (two) times daily with a meal., Disp: 180 tablet, Rfl: 1 .  Cholecalciferol (VITAMIN D-3) 1000 UNITS CAPS, Take 1,000 Units by mouth., Disp: , Rfl:  .  clobetasol cream (TEMOVATE) 3.79 %, APPLY 1 APPLICATION 2 TIMES DAILY. DO NOT USE FOR MORE THAN TWO WEEKS AT ONE TIME, Disp: 30 g, Rfl: 0 .  cloNIDine (CATAPRES) 0.1 MG tablet, PLEASE SEE ATTACHED FOR DETAILED DIRECTIONS, Disp: 60 tablet, Rfl: 1 .  esomeprazole (NEXIUM) 40 MG capsule, Take 1 capsule (40 mg total) by mouth daily at 12 noon., Disp: 30 capsule, Rfl: 0 .  fluticasone (FLONASE) 50 MCG/ACT nasal spray, Place 2 sprays into both nostrils daily., Disp: 16 g, Rfl: 6 .  gabapentin (NEURONTIN)  300 MG capsule, TAKE 1 CAPSULE BY MOUTH THREE TIMES A DAY, Disp: 270 capsule, Rfl: 0 .  glucose blood (ONE TOUCH ULTRA TEST) test strip, USE TO CHECK BLOOD SUGARS TWICE A DAY DX E11.9, Disp: 100 each, Rfl: 1 .  JARDIANCE 25 MG TABS tablet, TAKE 1 TABLET BY MOUTH EVERY DAY, Disp: 30 tablet, Rfl: 1 .  KLOR-CON M20 20 MEQ tablet, TAKE 20 MEQ BY MOUTH DAILY., Disp: , Rfl: 2 .  Lancets (ONETOUCH ULTRASOFT) lancets, USE TO HELP CHECK BLOOD SUGARS TWICE A DAY E11.9, Disp: 100 each, Rfl: 1 .  losartan (COZAAR) 100 MG tablet, TAKE 1 TABLET BY MOUTH EVERY DAY, Disp: 90 tablet, Rfl: 1 .  meclizine (ANTIVERT) 25 MG tablet, Take 0.5-1 tablets (12.5-25 mg total) by mouth 2 (two) times daily as needed for dizziness., Disp: 30 tablet, Rfl: 0 .  simvastatin (ZOCOR) 20 MG tablet, TAKE 1 TABLET BY MOUTH EVERY DAY, Disp: 30 tablet, Rfl: 1 .  simvastatin (ZOCOR) 20 MG tablet, TAKE 1 TABLET BY MOUTH EVERY DAY, Disp: 30 tablet, Rfl: 1  Social History   Tobacco Use  Smoking Status Never Smoker  Smokeless Tobacco Never Used    Allergies  Allergen Reactions  . Codeine Other (See Comments)    Woke up the next morning and did not  know where she was  . Other     Doesn't like to take pain medication due to hallucinations.  [name] Shellfish Allergy    Objective:  There were no vitals filed for this visit. There is no height or weight on file to calculate BMI. Constitutional Well developed. Well nourished.  Vascular Dorsalis pedis pulses present 2+ bilaterally  Posterior tibial pulses absent bilaterally  Pedal hair growth diminished. Capillary refill normal to all digits.  No cyanosis or clubbing noted.  Neurologic Normal speech. Oriented to person, place, and time. Epicritic sensation to light touch grossly present bilaterally. Protective sensation with 5.07 monofilament  present bilaterally. Vibratory sensation present bilaterally.  Dermatologic Nails elongated, thickened, dystrophic. No open wounds. No skin  lesions.  Orthopedic: Normal joint ROM without pain or crepitus bilaterally. No visible deformities. No bony tenderness.   Assessment:   1. Diabetes mellitus type 2 with peripheral artery disease ([place])   2. Onychomycosis   3. Encounter for diabetic foot exam [facility])    Plan:  Patient was evaluated and treated and all questions answered.  Diabetes with PAD, Onychomycosis -Educated on diabetic footcare. Diabetic risk level 1 -Nails x10 debrided sharply and manually with large nail nipper and rotary burr.   Procedure: Nail Debridement Rationale: Patient meets criteria for routine foot care due to PAD Type of Debridement: manual, sharp debridement. Instrumentation: Nail nipper, rotary burr. Number of Nails: 10   Return in about 3 months (around 05/01/2018) for Diabetic Foot Care.

## 2018-02-24 ENCOUNTER — Ambulatory Visit: Payer: Self-pay | Admitting: *Deleted

## 2018-02-24 NOTE — Telephone Encounter (Signed)
Pt calling with complaints of a stinging/burning sensation with wiping and also after urinating. Pt states that the symptoms have been present for the last 2-3 days. Pt states that she has urgency and frequency with urinating. Pt denies any fever, vaginal discharge or change in color or odor of urine. Pt scheduled for appt on 9/12. Unable to schedule appt with PCP on tomorrow due to pt having to pick up grandson at the time appt was available. Pt requesting appt to be in the morning after she drops her grandson off at school so that she will have time to make it to [place]. Pt advised that if symptoms become worse before scheduled appt on tomorrow to return call to the office. Pt verbalized understanding.  Reason for Disposition . Age > 50 years  Answer Assessment - Initial Assessment Questions 1. SEVERITY: "How bad is the pain?"  (e.g., Scale 1-10; mild, moderate, or severe)   - MILD (1-3): complains slightly about urination hurting   - MODERATE (4-7): interferes with normal activities     - SEVERE (8-10): excruciating, unwilling or unable to urinate because of the pain      Stinging and burning with urinating and after urinating, moderate 2. FREQUENCY: "How many times have you had painful urination today?"      Happens every time 3. PATTERN: "Is pain present every time you urinate or just sometimes?"      Yes 4. ONSET: "When did the painful urination start?"      2-3 days 5. FEVER: "Do you have a fever?" If so, ask: "What is your temperature, how was it measured, and when did it start?"     No 6. PAST UTI: "Have you had a urine infection before?" If so, ask: "When was the last time?" and "What happened that time?"      Yes, a couple of months back 7. CAUSE: "What do you think is causing the painful urination?"  (e.g., UTI, scratch, Herpes sore)     Possible UTI 8. OTHER SYMPTOMS: "Do you have any other symptoms?" (e.g., flank pain, vaginal discharge, genital sores, urgency, blood in  urine)   Urgency and frequency 9. PREGNANCY: "Is there any chance you are pregnant?" "When was your last menstrual period?"     n/a  Protocols used: URINATION PAIN [facility]

## 2018-02-25 ENCOUNTER — Other Ambulatory Visit (INDEPENDENT_AMBULATORY_CARE_PROVIDER_SITE_OTHER): Payer: PPO

## 2018-02-25 ENCOUNTER — Encounter: Payer: Self-pay | Admitting: Internal Medicine

## 2018-02-25 ENCOUNTER — Ambulatory Visit (INDEPENDENT_AMBULATORY_CARE_PROVIDER_SITE_OTHER): Payer: PPO | Admitting: Internal Medicine

## 2018-02-25 VITALS — BP 122/82 | HR 69 | Temp 97.8°F | Ht 63.0 in | Wt 162.0 lb

## 2018-02-25 DIAGNOSIS — R309 Painful micturition, unspecified: Secondary | ICD-10-CM

## 2018-02-25 DIAGNOSIS — Z23 Encounter for immunization: Secondary | ICD-10-CM | POA: Diagnosis not present

## 2018-02-25 DIAGNOSIS — N3941 Urge incontinence: Secondary | ICD-10-CM | POA: Diagnosis not present

## 2018-02-25 DIAGNOSIS — R300 Dysuria: Secondary | ICD-10-CM

## 2018-02-25 DIAGNOSIS — R81 Glycosuria: Secondary | ICD-10-CM

## 2018-02-25 DIAGNOSIS — K2100 Gastro-esophageal reflux disease with esophagitis, without bleeding: Secondary | ICD-10-CM

## 2018-02-25 LAB — URINALYSIS, ROUTINE W REFLEX MICROSCOPIC
Bilirubin Urine: NEGATIVE
Hgb urine dipstick: NEGATIVE
Ketones, ur: NEGATIVE
Leukocytes, UA: NEGATIVE
Nitrite: NEGATIVE
PH: 6 (ref 5.0–8.0)
RBC / HPF: NONE SEEN (ref 0–?)
SPECIFIC GRAVITY, URINE: 1.015 (ref 1.000–1.030)
TOTAL PROTEIN, URINE-UPE24: 100 — AB
Urine Glucose: >=1000 — AB
Urobilinogen, UA: 0.2 (ref 0.0–1.0)

## 2018-02-25 MED ORDER — ESOMEPRAZOLE MAGNESIUM 40 MG PO CPDR: 40.0000 mg | DELAYED_RELEASE_CAPSULE | Freq: Every day | ORAL | 2 refills | Status: DC

## 2018-02-25 NOTE — Assessment & Plan Note (Signed)
OK for urology referral,  to f/u any worsening symptoms or concerns

## 2018-02-25 NOTE — Patient Instructions (Signed)
Your specimen seem good today, so we can wait for the culture to see if any antibiotic is needed (takes 2-3 days)  Please continue all other medications as before, and refills have been done if requested.  Please have the pharmacy call with any other refills you may need.  Please continue your efforts at being more active, low cholesterol diabetic diet, and weight control.  Please keep your appointments with your specialists as you may have planned  You will be contacted regarding the referral for: Urology

## 2018-02-25 NOTE — Assessment & Plan Note (Signed)
Due to jardiance, pt reassured

## 2018-02-25 NOTE — Progress Notes (Signed)
Subjective:    Patient ID: Meredith Phillips, female    DOB: [birth year], 72 y.o.   MRN: [id number]  HPI  Here to f/u with c/o several months mild to mod intermittent dysuria symptoms also assoc with stinging and burning type worse for the past wk despite cranberry.  Denies urinary symptoms such as frequency, flank pain, hematuria or n/v, fever, chills but also has urge incontinence with occasional small wetting accidents.  Pt denies chest pain, increased sob or doe, wheezing, orthopnea, PND, increased LE swelling, palpitations, dizziness or syncope.   Pt denies polydipsia, polyuria. Denies worsening reflux, abd pain, dysphagia, n/v, bowel change or blood. Past Medical History:  Diagnosis Date  . Arthritis   . Benign essential HTN 02/18/2015  . Cellulitis 02/18/2015  . Chicken pox   . Depression    since last 04/2018 daughter passed.    . Diabetes mellitus without complication ([place])   . Diabetic peripheral neuropathy ([place])   . GERD (gastroesophageal reflux disease)   . Hypercholesteremia   . Hypertension   . Liver disease   . Polyp of colon    Past Surgical History:  Procedure Laterality Date  . APPENDECTOMY    . HERNIA REPAIR    . KNEE SURGERY    . ROTATOR CUFF REPAIR    . TONSILLECTOMY      reports that she has never smoked. She has never used smokeless tobacco. She reports that she does not drink alcohol or use drugs. family history includes Healthy in her father, mother, paternal grandfather, and paternal grandmother. Allergies  Allergen Reactions  . Codeine Other (See Comments)    Woke up the next morning and did not know where she was  . Other     Doesn't like to take pain medication due to hallucinations.  . Shellfish Allergy    Current Outpatient Medications on File Prior to Visit  Medication Sig Dispense Refill  . amLODipine (NORVASC) 10 MG tablet Take 1 tablet (10 mg total) by mouth daily. 90 tablet 1  . aspirin EC 81 MG tablet Take 81 mg by mouth daily.    . Blood  Glucose Monitoring Suppl (ONE TOUCH ULTRA 2) w/Device KIT Use to check blood sugars twice a day Dx E11.9 1 each 0  . carvedilol (COREG) 3.125 MG tablet Take 1 tablet (3.125 mg total) by mouth 2 (two) times daily with a meal. 180 tablet 1  . Cholecalciferol (VITAMIN D-3) 1000 UNITS CAPS Take 1,000 Units by mouth.    . clobetasol cream (TEMOVATE) 9.81 % APPLY 1 APPLICATION 2 TIMES DAILY. DO NOT USE FOR MORE THAN TWO WEEKS AT ONE TIME 30 g 0  . cloNIDine (CATAPRES) 0.1 MG tablet PLEASE SEE ATTACHED FOR DETAILED DIRECTIONS 60 tablet 1  . fluticasone (FLONASE) 50 MCG/ACT nasal spray Place 2 sprays into both nostrils daily. 16 g 6  . gabapentin (NEURONTIN) 300 MG capsule TAKE 1 CAPSULE BY MOUTH THREE TIMES A DAY 270 capsule 0  . glucose blood (ONE TOUCH ULTRA TEST) test strip USE TO CHECK BLOOD SUGARS TWICE A DAY DX E11.9 100 each 1  . JARDIANCE 25 MG TABS tablet TAKE 1 TABLET BY MOUTH EVERY DAY 30 tablet 1  . KLOR-CON M20 20 MEQ tablet TAKE 20 MEQ BY MOUTH DAILY.  2  . Lancets (ONETOUCH ULTRASOFT) lancets USE TO HELP CHECK BLOOD SUGARS TWICE A DAY E11.9 100 each 1  . losartan (COZAAR) 100 MG tablet TAKE 1 TABLET BY MOUTH EVERY DAY 90 tablet 1  .  meclizine (ANTIVERT) 25 MG tablet Take 0.5-1 tablets (12.5-25 mg total) by mouth 2 (two) times daily as needed for dizziness. 30 tablet 0  . simvastatin (ZOCOR) 20 MG tablet TAKE 1 TABLET BY MOUTH EVERY DAY 30 tablet 1  . simvastatin (ZOCOR) 20 MG tablet TAKE 1 TABLET BY MOUTH EVERY DAY 30 tablet 1   No current facility-administered medications on file prior to visit.    Review of Systems  Constitutional: Negative for other unusual diaphoresis or sweats HENT: Negative for ear discharge or swelling Eyes: Negative for other worsening visual disturbances Respiratory: Negative for stridor or other swelling  Gastrointestinal: Negative for worsening distension or other blood Genitourinary: Negative for retention or other urinary change Musculoskeletal: Negative  for other MSK pain or swelling Skin: Negative for color change or other new lesions Neurological: Negative for worsening tremors and other numbness  Psychiatric/Behavioral: Negative for worsening agitation or other fatigue All other system neg per pt    Objective:   Physical Exam BP 122/82   Pulse 69   Temp 97.8 F (36.6 C) (Oral)   Ht '5\' 3"'  (1.6 m)   Wt 162 lb (73.5 kg)   SpO2 97%   BMI 28.70 kg/m  VS noted,  Constitutional: Pt appears in NAD HENT: Head: NCAT.  Right Ear: External ear normal.  Left Ear: External ear normal.  Eyes: . Pupils are equal, round, and reactive to light. Conjunctivae and EOM are normal Nose: without d/c or deformity Neck: Neck supple. Gross normal ROM Cardiovascular: Normal rate and regular rhythm.   Pulmonary/Chest: Effort normal and breath sounds without rales or wheezing.  Abd:  Soft, NT, ND, + BS, no organomegaly Neurological: Pt is alert. At baseline orientation, motor grossly intact Skin: Skin is warm. No rashes, other new lesions, no LE edema Psychiatric: Pt behavior is normal without agitation  No other exam findings  Urinalysis, Routine w reflex microscopic  Order: [id number]  Status:  Final result Visible to patient:  No (Not Released) Dx:  Painful urination   Ref Range & Units 10:32 (02/25/18) 60moago (11/02/17) 977mogo (05/18/17) 72m51moo (05/18/17)  Color, Urine Yellow;Lt. Yellow YELLOW  YELLOW   YELLOW R  APPearance Clear CLEAR  Sl CloudyAbnormal    CLEAR R  Specific Gravity, Urine 1.000 - 1.030 1.015  1.015   1.015 R  pH 5.0 - 8.0 6.0  6.0   6.5   Total Protein, Urine Negative 100Abnormal   100Abnormal      Urine Glucose Negative >=1000Abnormal   >=1000Abnormal      Ketones, ur Negative NEGATIVE  NEGATIVE   NEGATIVE R  Bilirubin Urine Negative NEGATIVE  NEGATIVE   NEGATIVE R  Hgb urine dipstick Negative NEGATIVE  TRACE-INTACTAbnormal    NEGATIVE R  Urobilinogen, UA 0.0 - 1.0 0.2  0.2     Leukocytes, UA Negative NEGATIVE   TRACEAbnormal    SMALLAbnormal  R  Nitrite Negative NEGATIVE  NEGATIVE   NEGATIVE R  WBC, UA 0-2/hpf 0-2/hpf  0-2/hpf  0-5 R   RBC / HPF 0-2/hpf none seen  0-2/hpf  NONE SEEN R   Squamous Epithelial / LPF Rare(0-4/hpf) Rare(0-4/hpf)  Rare(0-4/hpf)  0-5Abnormal             Assessment & Plan:

## 2018-02-25 NOTE — Assessment & Plan Note (Signed)
Actually seems to be a recurrent with several prior urine cx negative on EMR; no fever and exam benign, I have low suspicion for UTI, for urine studies but then tx pending results

## 2018-02-25 NOTE — Assessment & Plan Note (Addendum)
stable overall by history and exam, and pt to continue medical treatment as before,  to f/u any worsening symptoms or concerns 

## 2018-02-26 LAB — URINE CULTURE
MICRO NUMBER:: 91094177
SPECIMEN QUALITY:: ADEQUATE

## 2018-02-27 ENCOUNTER — Encounter: Payer: Self-pay | Admitting: Internal Medicine

## 2018-03-01 ENCOUNTER — Encounter: Payer: Self-pay | Admitting: Nurse Practitioner

## 2018-03-05 ENCOUNTER — Other Ambulatory Visit: Payer: Self-pay | Admitting: Nurse Practitioner

## 2018-03-09 ENCOUNTER — Other Ambulatory Visit: Payer: Self-pay | Admitting: Nurse Practitioner

## 2018-03-10 ENCOUNTER — Other Ambulatory Visit (INDEPENDENT_AMBULATORY_CARE_PROVIDER_SITE_OTHER): Payer: PPO

## 2018-03-10 ENCOUNTER — Encounter: Payer: Self-pay | Admitting: Nurse Practitioner

## 2018-03-10 ENCOUNTER — Ambulatory Visit (INDEPENDENT_AMBULATORY_CARE_PROVIDER_SITE_OTHER): Payer: PPO | Admitting: Nurse Practitioner

## 2018-03-10 VITALS — BP 140/98 | HR 65 | Ht 63.0 in | Wt 163.0 lb

## 2018-03-10 DIAGNOSIS — Z0000 Encounter for general adult medical examination without abnormal findings: Secondary | ICD-10-CM

## 2018-03-10 DIAGNOSIS — E119 Type 2 diabetes mellitus without complications: Secondary | ICD-10-CM | POA: Diagnosis not present

## 2018-03-10 DIAGNOSIS — K2100 Gastro-esophageal reflux disease with esophagitis, without bleeding: Secondary | ICD-10-CM

## 2018-03-10 DIAGNOSIS — I10 Essential (primary) hypertension: Secondary | ICD-10-CM

## 2018-03-10 DIAGNOSIS — Z1231 Encounter for screening mammogram for malignant neoplasm of breast: Secondary | ICD-10-CM | POA: Diagnosis not present

## 2018-03-10 DIAGNOSIS — Z0001 Encounter for general adult medical examination with abnormal findings: Secondary | ICD-10-CM | POA: Diagnosis not present

## 2018-03-10 DIAGNOSIS — Z1239 Encounter for other screening for malignant neoplasm of breast: Secondary | ICD-10-CM

## 2018-03-10 LAB — CBC
HEMATOCRIT: 40.9 % (ref 36.0–46.0)
HEMOGLOBIN: 13.5 g/dL (ref 12.0–15.0)
MCHC: 33 g/dL (ref 30.0–36.0)
MCV: 84.9 fl (ref 78.0–100.0)
PLATELETS: 344 10*3/uL (ref 150.0–400.0)
RBC: 4.82 Mil/uL (ref 3.87–5.11)
RDW: 14.4 % (ref 11.5–15.5)
WBC: 5.6 10*3/uL (ref 4.0–10.5)

## 2018-03-10 LAB — COMPREHENSIVE METABOLIC PANEL
ALT: 8 U/L (ref 0–35)
AST: 12 U/L (ref 0–37)
Albumin: 4.2 g/dL (ref 3.5–5.2)
Alkaline Phosphatase: 64 U/L (ref 39–117)
BUN: 13 mg/dL (ref 6–23)
CHLORIDE: 104 meq/L (ref 96–112)
CO2: 28 meq/L (ref 19–32)
Calcium: 10.5 mg/dL (ref 8.4–10.5)
Creatinine, Ser: 0.96 mg/dL (ref 0.40–1.20)
GFR: 72.92 mL/min (ref >60.00)
GLUCOSE: 113 mg/dL — AB (ref 70–99)
POTASSIUM: 3.9 meq/L (ref 3.5–5.1)
Sodium: 140 mEq/L (ref 135–145)
Total Bilirubin: 0.6 mg/dL (ref 0.2–1.2)
Total Protein: 8.1 g/dL (ref 6.0–8.3)

## 2018-03-10 LAB — LIPID PANEL
Cholesterol: 175 mg/dL (ref 0–200)
HDL: 59.5 mg/dL (ref >39.00)
LDL Cholesterol: 101 mg/dL — ABNORMAL HIGH (ref 0–99)
NonHDL: 115.14
Total CHOL/HDL Ratio: 3
Triglycerides: 71 mg/dL (ref 0.0–149.0)
VLDL: 14.2 mg/dL (ref 0.0–40.0)

## 2018-03-10 LAB — HEMOGLOBIN A1C: HEMOGLOBIN A1C: 6.5 % (ref 4.6–6.5)

## 2018-03-10 LAB — MAGNESIUM: MAGNESIUM: 2.3 mg/dL (ref 1.5–2.5)

## 2018-03-10 NOTE — Patient Instructions (Addendum)
Please head downstairs for lab work  If any of your test results are critically abnormal, you will be contacted right away. Otherwise, I will contact you within a week about your test results and follow up recommendations  I have placed an order for your mammogram.  I will plan to see you back in 6 months for routine follow up, or sooner if needed.  Health Maintenance, Female Adopting a healthy lifestyle and getting preventive care can go a long way to promote health and wellness. Talk with your health care provider about what schedule of regular examinations is right for you. This is a good chance for you to check in with your provider about disease prevention and staying healthy. In between checkups, there are plenty of things you can do on your own. Experts have done a lot of research about which lifestyle changes and preventive measures are most likely to keep you healthy. Ask your health care provider for more information. Weight and diet Eat a healthy diet  Be sure to include plenty of vegetables, fruits, low-fat dairy products, and lean protein.  Do not eat a lot of foods high in solid fats, added sugars, or salt.  Get regular exercise. This is one of the most important things you can do for your health. ? Most adults should exercise for at least 150 minutes each week. The exercise should increase your heart rate and make you sweat (moderate-intensity exercise). ? Most adults should also do strengthening exercises at least twice a week. This is in addition to the moderate-intensity exercise.  Maintain a healthy weight  Body mass index (BMI) is a measurement that can be used to identify possible weight problems. It estimates body fat based on height and weight. Your health care provider can help determine your BMI and help you achieve or maintain a healthy weight.  For females 80 years of age and older: ? A BMI below 18.5 is considered underweight. ? A BMI of 18.5 to 24.9 is  normal. ? A BMI of 25 to 29.9 is considered overweight. ? A BMI of 30 and above is considered obese.  Watch levels of cholesterol and blood lipids  You should start having your blood tested for lipids and cholesterol at 74 years of age, then have this test every 5 years.  You may need to have your cholesterol levels checked more often if: ? Your lipid or cholesterol levels are high. ? You are older than 74 years of age. ? You are at high risk for heart disease.  Cancer screening Lung Cancer  Lung cancer screening is recommended for adults 34-38 years old who are at high risk for lung cancer because of a history of smoking.  A yearly low-dose CT scan of the lungs is recommended for people who: ? Currently smoke. ? Have quit within the past 15 years. ? Have at least a 30-pack-year history of smoking. A pack year is smoking an average of one pack of cigarettes a day for 1 year.  Yearly screening should continue until it has been 15 years since you quit.  Yearly screening should stop if you develop a health problem that would prevent you from having lung cancer treatment.  Breast Cancer  Practice breast self-awareness. This means understanding how your breasts normally appear and feel.  It also means doing regular breast self-exams. Let your health care provider know about any changes, no matter how small.  If you are in your 20s or 30s, you should have  a clinical breast exam (CBE) by a health care provider every 1-3 years as part of a regular health exam.  If you are 38 or older, have a CBE every year. Also consider having a breast X-ray (mammogram) every year.  If you have a family history of breast cancer, talk to your health care provider about genetic screening.  If you are at high risk for breast cancer, talk to your health care provider about having an MRI and a mammogram every year.  Breast cancer gene (BRCA) assessment is recommended for women who have family members  with BRCA-related cancers. BRCA-related cancers include: ? Breast. ? Ovarian. ? Tubal. ? Peritoneal cancers.  Results of the assessment will determine the need for genetic counseling and BRCA1 and BRCA2 testing.  Cervical Cancer Your health care provider may recommend that you be screened regularly for cancer of the pelvic organs (ovaries, uterus, and vagina). This screening involves a pelvic examination, including checking for microscopic changes to the surface of your cervix (Pap test). You may be encouraged to have this screening done every 3 years, beginning at age 15.  For women ages 61-65, health care providers may recommend pelvic exams and Pap testing every 3 years, or they may recommend the Pap and pelvic exam, combined with testing for human papilloma virus (HPV), every 5 years. Some types of HPV increase your risk of cervical cancer. Testing for HPV may also be done on women of any age with unclear Pap test results.  Other health care providers may not recommend any screening for nonpregnant women who are considered low risk for pelvic cancer and who do not have symptoms. Ask your health care provider if a screening pelvic exam is right for you.  If you have had past treatment for cervical cancer or a condition that could lead to cancer, you need Pap tests and screening for cancer for at least 20 years after your treatment. If Pap tests have been discontinued, your risk factors (such as having a new sexual partner) need to be reassessed to determine if screening should resume. Some women have medical problems that increase the chance of getting cervical cancer. In these cases, your health care provider may recommend more frequent screening and Pap tests.  Colorectal Cancer  This type of cancer can be detected and often prevented.  Routine colorectal cancer screening usually begins at 75 years of age and continues through 74 years of age.  Your health care provider may recommend  screening at an earlier age if you have risk factors for colon cancer.  Your health care provider may also recommend using home test kits to check for hidden blood in the stool.  A small camera at the end of a tube can be used to examine your colon directly (sigmoidoscopy or colonoscopy). This is done to check for the earliest forms of colorectal cancer.  Routine screening usually begins at age 35.  Direct examination of the colon should be repeated every 5-10 years through 74 years of age. However, you may need to be screened more often if early forms of precancerous polyps or small growths are found.  Skin Cancer  Check your skin from head to toe regularly.  Tell your health care provider about any new moles or changes in moles, especially if there is a change in a mole's shape or color.  Also tell your health care provider if you have a mole that is larger than the size of a pencil eraser.  Always use  sunscreen. Apply sunscreen liberally and repeatedly throughout the day.  Protect yourself by wearing long sleeves, pants, a wide-brimmed hat, and sunglasses whenever you are outside.  Heart disease, diabetes, and high blood pressure  High blood pressure causes heart disease and increases the risk of stroke. High blood pressure is more likely to develop in: ? People who have blood pressure in the high end of the normal range (130-139/85-89 mm Hg). ? People who are overweight or obese. ? People who are African American.  If you are 20-67 years of age, have your blood pressure checked every 3-5 years. If you are 78 years of age or older, have your blood pressure checked every year. You should have your blood pressure measured twice-once when you are at a hospital or clinic, and once when you are not at a hospital or clinic. Record the average of the two measurements. To check your blood pressure when you are not at a hospital or clinic, you can use: ? An automated blood pressure machine at  a pharmacy. ? A home blood pressure monitor.  If you are between 55 years and 13 years old, ask your health care provider if you should take aspirin to prevent strokes.  Have regular diabetes screenings. This involves taking a blood sample to check your fasting blood sugar level. ? If you are at a normal weight and have a low risk for diabetes, have this test once every three years after 74 years of age. ? If you are overweight and have a high risk for diabetes, consider being tested at a younger age or more often. Preventing infection Hepatitis B  If you have a higher risk for hepatitis B, you should be screened for this virus. You are considered at high risk for hepatitis B if: ? You were born in a country where hepatitis B is common. Ask your health care provider which countries are considered high risk. ? Your parents were born in a high-risk country, and you have not been immunized against hepatitis B (hepatitis B vaccine). ? You have HIV or AIDS. ? You use needles to inject street drugs. ? You live with someone who has hepatitis B. ? You have had sex with someone who has hepatitis B. ? You get hemodialysis treatment. ? You take certain medicines for conditions, including cancer, organ transplantation, and autoimmune conditions.  Hepatitis C  Blood testing is recommended for: ? Everyone born from [birth year] through 1967. ? Anyone with known risk factors for hepatitis C.  Sexually transmitted infections (STIs)  You should be screened for sexually transmitted infections (STIs) including gonorrhea and chlamydia if: ? You are sexually active and are younger than 74 years of age. ? You are older than 74 years of age and your health care provider tells you that you are at risk for this type of infection. ? Your sexual activity has changed since you were last screened and you are at an increased risk for chlamydia or gonorrhea. Ask your health care provider if you are at risk.  If you do  not have HIV, but are at risk, it may be recommended that you take a prescription medicine daily to prevent HIV infection. This is called pre-exposure prophylaxis (PrEP). You are considered at risk if: ? You are sexually active and do not regularly use condoms or know the HIV status of your partner(s). ? You take drugs by injection. ? You are sexually active with a partner who has HIV.  Talk with your health  care provider about whether you are at high risk of being infected with HIV. If you choose to begin PrEP, you should first be tested for HIV. You should then be tested every 3 months for as long as you are taking PrEP. Pregnancy  If you are premenopausal and you may become pregnant, ask your health care provider about preconception counseling.  If you may become pregnant, take 400 to 800 micrograms (mcg) of folic acid every day.  If you want to prevent pregnancy, talk to your health care provider about birth control (contraception). Osteoporosis and menopause  Osteoporosis is a disease in which the bones lose minerals and strength with aging. This can result in serious bone fractures. Your risk for osteoporosis can be identified using a bone density scan.  If you are 54 years of age or older, or if you are at risk for osteoporosis and fractures, ask your health care provider if you should be screened.  Ask your health care provider whether you should take a calcium or vitamin D supplement to lower your risk for osteoporosis.  Menopause may have certain physical symptoms and risks.  Hormone replacement therapy may reduce some of these symptoms and risks. Talk to your health care provider about whether hormone replacement therapy is right for you. Follow these instructions at home:  Schedule regular health, dental, and eye exams.  Stay current with your immunizations.  Do not use any tobacco products including cigarettes, chewing tobacco, or electronic cigarettes.  If you are  pregnant, do not drink alcohol.  If you are breastfeeding, limit how much and how often you drink alcohol.  Limit alcohol intake to no more than 1 drink per day for nonpregnant women. One drink equals 12 ounces of beer, 5 ounces of wine, or 1 ounces of hard liquor.  Do not use street drugs.  Do not share needles.  Ask your health care provider for help if you need support or information about quitting drugs.  Tell your health care provider if you often feel depressed.  Tell your health care provider if you have ever been abused or do not feel safe at home. This information is not intended to replace advice given to you by your health care provider. Make sure you discuss any questions you have with your health care provider. Document Released: 12/16/2010 Document Revised: 11/08/2015 Document Reviewed: 03/06/2015 Elsevier Interactive Patient Education  [employer].

## 2018-03-10 NOTE — Assessment & Plan Note (Signed)
-  USPSTF grade A and B recommendations reviewed with patient; age-appropriate recommendations, preventive care, screening tests, etc discussed and encouraged; healthy living encouraged; see AVS for patient education given to patient. Advanced directives packet provided to pt -Follow up and care instructions discussed and provided in AVS.  -Reviewed Health Maintenance: Screening for breast cancer-- MM DIGITAL SCREENING BILATERAL; Future  Routine general medical examination at a health care facility - CBC; Future - Comprehensive metabolic panel; Future - Hemoglobin A1c; Future - Lipid panel; Future - MM DIGITAL SCREENING BILATERAL; Future - HM DIABETES FOOT EXAM; Future-We discussed routine foot inspection at home, wearing supportive shoes.

## 2018-03-10 NOTE — Assessment & Plan Note (Signed)
Stable Continue current medication F/u for new, worsening symptoms - Magnesium; Future  

## 2018-03-10 NOTE — Assessment & Plan Note (Signed)
BP slightly elevated off meds today Continue current medications Instructed to all medications daily as prescribed - CBC; Future - Comprehensive metabolic panel; Future - Lipid panel; Future

## 2018-03-10 NOTE — Progress Notes (Signed)
Name: Meredith Phillips   MRN: [id number]    DOB: 02/26/1946   Date:03/10/2018       Progress Note  Subjective  Chief Complaint CPE   HPI  Patient presents for annual CPE.  Diet, Exercise: not watching her diet, no routine exercise, might starting walking  USPSTF grade A and B recommendations  Depression:  Depression screen PHQ 2/9 04/30/2017 12/30/2016 02/12/2016 12/26/2014  Decreased Interest 0 0 0 0  Down, Depressed, Hopeless 0 0 0 0  PHQ - 2 Score 0 0 0 0   Hypertension: losartan 100, clonidine 0.1, carvedilol 3.125 bid, amlodipine 10, reports daily medication compliance but does say she skipped her medications this am due to fasting for labs BP Readings from Last 3 Encounters:  03/10/18 (!) 140/98  02/25/18 122/82  01/11/18 (!) 142/80   Obesity: Wt Readings from Last 3 Encounters:  03/10/18 163 lb (73.9 kg)  02/25/18 162 lb (73.5 kg)  01/11/18 164 lb (74.4 kg)   BMI Readings from Last 3 Encounters:  03/10/18 28.87 kg/m  02/25/18 28.70 kg/m  01/11/18 29.05 kg/m    Alcohol: no Tobacco use: no, never  hep C: screening done  Menstrual History/LMP/Abnormal Bleeding: s/p menopausal, no pelvic pain/bleeding  Vaccinations: up to date   Advanced Care Planning: A voluntary discussion about advance care planning including the explanation and discussion of advance directives.  Discussed health care proxy and Living will, and the patient DOES NOT have a living will at present time. If patient does have living will, I have requested they bring this to the clinic to be scanned in to their chart.  Breast cancer: mammogram ordered  Osteoporosis: DEXA up to date  Lipids: simvastatin 20 daily, reports daily med compliance Lab Results  Component Value Date   CHOL 160 04/15/2017   CHOL 119 01/01/2015   Lab Results  Component Value Date   HDL 59.10 04/15/2017   HDL 52.80 01/01/2015   Lab Results  Component Value Date   LDLCALC 88 04/15/2017   LDLCALC 55 01/01/2015    Lab Results  Component Value Date   TRIG 67.0 04/15/2017   TRIG 58.0 01/01/2015   Lab Results  Component Value Date   CHOLHDL 3 04/15/2017   CHOLHDL 2 01/01/2015   No results found for: LDLDIRECT  Diabetes- maintained on jardiance 25 daily Reports daily medication compliance without adverse medication effects. She reports recent glucose readings have been around 100  Lab Results  Component Value Date   HGBA1C 6.0 11/02/2017   Colorectal cancer: colonoscopy up to date  ECG: not indicated  Patient Active Problem List   Diagnosis Date Noted  . Urge incontinence 02/25/2018  . Dysuria 02/25/2018  . Glucosuria 02/25/2018  . Anxiety and depression 12/16/2017  . Memory loss 11/02/2017  . B12 deficiency 11/02/2017  . Vertigo 10/10/2017  . Trochanteric bursitis of left hip 10/10/2017  . Murmur, cardiac 04/15/2017  . Asymptomatic postmenopausal estrogen deficiency 04/15/2017  . Right-sided low back pain without sciatica 02/10/2017  . Dark stools 12/23/2016  . Grief at loss of child 07/29/2016  . Allergic rhinitis 02/27/2016  . GERD (gastroesophageal reflux disease) 08/06/2015  . Baker's cyst, ruptured 08/03/2015  . Type 2 diabetes mellitus (HCC) 06/29/2015  . Arthritis of left knee 06/15/2015  . Benign essential HTN 02/18/2015  . Cellulitis 02/18/2015  . Sciatica 01/17/2015  . Routine general medical examination at a health care facility 12/26/2014  . Medicare annual wellness visit, subsequent 12/26/2014    Past Surgical History:    Procedure Laterality Date  . APPENDECTOMY    . HERNIA REPAIR    . KNEE SURGERY    . ROTATOR CUFF REPAIR    . TONSILLECTOMY      Family History  Problem Relation Age of Onset  . Healthy Mother   . Healthy Father   . Healthy Paternal Grandmother   . Healthy Paternal Grandfather     Social History   Socioeconomic History  . Marital status: Married    Spouse name: Not on file  . Number of children: 5  . Years of education: 12   . Highest education level: Not on file  Occupational History  . Occupation: Retired  Social Needs  . Financial resource strain: Not on file  . Food insecurity:    Worry: Not on file    Inability: Not on file  . Transportation needs:    Medical: Not on file    Non-medical: Not on file  Tobacco Use  . Smoking status: Never Smoker  . Smokeless tobacco: Never Used  Substance and Sexual Activity  . Alcohol use: No  . Drug use: No  . Sexual activity: Not on file  Lifestyle  . Physical activity:    Days per week: Not on file    Minutes per session: Not on file  . Stress: Not on file  Relationships  . Social connections:    Talks on phone: Not on file    Gets together: Not on file    Attends religious service: Not on file    Active member of club or organization: Not on file    Attends meetings of clubs or organizations: Not on file    Relationship status: Not on file  . Intimate partner violence:    Fear of current or ex partner: Not on file    Emotionally abused: Not on file    Physically abused: Not on file    Forced sexual activity: Not on file  Other Topics Concern  . Not on file  Social History Narrative   Fun: paint, word search   Feels safe where she is living and no concern for abuse   Denies religious beliefs effecting healthcare.      Current Outpatient Medications:  .  amLODipine (NORVASC) 10 MG tablet, Take 1 tablet (10 mg total) by mouth daily., Disp: 90 tablet, Rfl: 1 .  aspirin EC 81 MG tablet, Take 81 mg by mouth daily., Disp: , Rfl:  .  Blood Glucose Monitoring Suppl (ONE TOUCH ULTRA 2) w/Device KIT, Use to check blood sugars twice a day Dx E11.9, Disp: 1 each, Rfl: 0 .  carvedilol (COREG) 3.125 MG tablet, Take 1 tablet (3.125 mg total) by mouth 2 (two) times daily with a meal., Disp: 180 tablet, Rfl: 1 .  Cholecalciferol (VITAMIN D-3) 1000 UNITS CAPS, Take 1,000 Units by mouth., Disp: , Rfl:  .  clobetasol cream (TEMOVATE) 0.05 %, APPLY 1 APPLICATION 2  TIMES DAILY. DO NOT USE FOR MORE THAN TWO WEEKS AT ONE TIME, Disp: 30 g, Rfl: 0 .  cloNIDine (CATAPRES) 0.1 MG tablet, PLEASE SEE ATTACHED FOR DETAILED DIRECTIONS, Disp: 60 tablet, Rfl: 1 .  esomeprazole (NEXIUM) 40 MG capsule, Take 1 capsule (40 mg total) by mouth daily at 12 noon., Disp: 30 capsule, Rfl: 2 .  fluticasone (FLONASE) 50 MCG/ACT nasal spray, Place 2 sprays into both nostrils daily., Disp: 16 g, Rfl: 6 .  gabapentin (NEURONTIN) 300 MG capsule, TAKE 1 CAPSULE BY MOUTH THREE TIMES A DAY,   Disp: 270 capsule, Rfl: 0 .  glucose blood (ONE TOUCH ULTRA TEST) test strip, USE TO CHECK BLOOD SUGARS TWICE A DAY DX E11.9, Disp: 100 each, Rfl: 1 .  JARDIANCE 25 MG TABS tablet, TAKE 1 TABLET BY MOUTH EVERY DAY, Disp: 30 tablet, Rfl: 1 .  KLOR-CON M20 20 MEQ tablet, TAKE 20 MEQ BY MOUTH DAILY., Disp: , Rfl: 2 .  Lancets (ONETOUCH ULTRASOFT) lancets, USE TO HELP CHECK BLOOD SUGARS TWICE A DAY E11.9, Disp: 100 each, Rfl: 1 .  losartan (COZAAR) 100 MG tablet, TAKE 1 TABLET BY MOUTH EVERY DAY, Disp: 90 tablet, Rfl: 1 .  meclizine (ANTIVERT) 25 MG tablet, Take 0.5-1 tablets (12.5-25 mg total) by mouth 2 (two) times daily as needed for dizziness., Disp: 30 tablet, Rfl: 0 .  simvastatin (ZOCOR) 20 MG tablet, TAKE 1 TABLET BY MOUTH EVERY DAY, Disp: 30 tablet, Rfl: 1  Allergies  Allergen Reactions  . Codeine Other (See Comments)    Woke up the next morning and did not know where she was  . Other     Doesn't like to take pain medication due to hallucinations.  . Shellfish Allergy      ROS  Constitutional: Negative for fever or weight change.  Respiratory: Negative for cough and shortness of breath.   Cardiovascular: Negative for chest pain or palpitations.  Gastrointestinal: Negative for abdominal pain, no bowel changes.  Musculoskeletal: Negative for gait problem or joint swelling.  Skin: Negative for rash.  Neurological: Negative for dizziness or headache.  No other specific complaints in a  complete review of systems (except as listed in HPI above).   Objective  Vitals:   03/10/18 1032  BP: (!) 140/98  Pulse: 65  SpO2: 97%  Weight: 163 lb (73.9 kg)  Height: 5' 3" (1.6 m)    Body mass index is 28.87 kg/m.  Physical Exam Vital signs reviewed. Constitutional: Patient appears well-developed and well-nourished. No distress.  HENT: Head: Normocephalic and atraumatic. Ears: B TMs ok, no erythema or effusion; Nose: Nose normal. Mouth/Throat: Oropharynx is clear and moist. No oropharyngeal exudate.  Eyes: Conjunctivae and EOM are normal. Pupils are equal, round, and reactive to light. No scleral icterus.  Neck: Normal range of motion. Neck supple. No cervical adenopathy. No thyromegaly present.  Cardiovascular: Normal rate, regular rhythm and normal heart sounds.  No murmur heard. No BLE edema. Distal pulses intact Pulmonary/Chest: Effort normal and breath sounds normal. No respiratory distress. Abdominal: Soft. Bowel sounds are normal, no distension. Breast: defd to mammo Musculoskeletal: Normal range of motion,  No gross deformities Neurological: She is alert and oriented to person, place, and time. No cranial nerve deficit. Coordination, balance, strength, speech and gait are normal.  Skin: Skin is warm and dry. No rash noted. No erythema.  Psychiatric: Patient has a normal mood and affect. behavior is normal. Judgment and thought content normal.  Diabetic Foot Exam - Simple   Simple Foot Form Diabetic Foot exam was performed with the following findings:  Yes   Visual Inspection No deformities, no ulcerations, no other skin breakdown bilaterally:  Yes Sensation Testing Intact to touch and monofilament testing bilaterally:  Yes Pulse Check Posterior Tibialis and Dorsalis pulse intact bilaterally:  Yes Comments      Assessment & Plan RTC in 6 months for F/U: DM- A1c

## 2018-03-10 NOTE — Assessment & Plan Note (Signed)
Continue jardiance Update labs - CBC; Future - Comprehensive metabolic panel; Future - Hemoglobin A1c; Future - Lipid panel; Future - HM DIABETES FOOT EXAM; Future

## 2018-03-12 ENCOUNTER — Other Ambulatory Visit: Payer: Self-pay | Admitting: Nurse Practitioner

## 2018-03-23 ENCOUNTER — Ambulatory Visit: Payer: Self-pay

## 2018-03-23 NOTE — Telephone Encounter (Signed)
Pt. Reports she has had dizziness x 2 weeks. "Maybe it's my medicine." States she  has to hold on to furniture at times walking around the house. Changes positions slowly to avoid falling. No other symptoms. Appointment made for tomorrow.  Reason for Disposition . [1] MODERATE dizziness (e.g., interferes with normal activities) AND [2] has NOT been evaluated by physician for this  (Exception: dizziness caused by heat exposure, sudden standing, or poor fluid intake)  Answer Assessment - Initial Assessment Questions 1. DESCRIPTION: "Describe your dizziness."     Lightheaded 2. LIGHTHEADED: "Do you feel lightheaded?" (e.g., somewhat faint, woozy, weak upon standing)     All the time - changes position slowly 3. VERTIGO: "Do you feel like either you or the room is spinning or tilting?" (i.e. vertigo)     No 4. SEVERITY: "How bad is it?"  "Do you feel like you are going to faint?" "Can you stand and walk?"   - MILD - walking normally   - MODERATE - interferes with normal activities (e.g., work, school)    - SEVERE - unable to stand, requires support to walk, feels like passing out now.      Moderate 5. ONSET:  "When did the dizziness begin?"     2 weeks ago 6. AGGRAVATING FACTORS: "Does anything make it worse?" (e.g., standing, change in head position)     Moving around 7. HEART RATE: "Can you tell me your heart rate?" "How many beats in 15 seconds?"  (Note: not all patients can do this)       No 8. CAUSE: "What do you think is causing the dizziness?"     Unsure 9. RECURRENT SYMPTOM: "Have you had dizziness before?" If so, ask: "When was the last time?" "What happened that time?"     No 10. OTHER SYMPTOMS: "Do you have any other symptoms?" (e.g., fever, chest pain, vomiting, diarrhea, bleeding)       No 11. PREGNANCY: "Is there any chance you are pregnant?" "When was your last menstrual period?"       No  Protocols used: DIZZINESS [facility]

## 2018-03-24 ENCOUNTER — Ambulatory Visit: Payer: Self-pay | Admitting: Nurse Practitioner

## 2018-03-25 ENCOUNTER — Observation Stay (HOSPITAL_BASED_OUTPATIENT_CLINIC_OR_DEPARTMENT_OTHER)
Admission: EM | Admit: 2018-03-25 | Discharge: 2018-03-27 | Disposition: A | Discharge status: Home / Self Care | Payer: PPO | Attending: Internal Medicine | Admitting: Internal Medicine

## 2018-03-25 ENCOUNTER — Emergency Department (HOSPITAL_BASED_OUTPATIENT_CLINIC_OR_DEPARTMENT_OTHER): Payer: PPO

## 2018-03-25 ENCOUNTER — Encounter (HOSPITAL_BASED_OUTPATIENT_CLINIC_OR_DEPARTMENT_OTHER): Payer: Self-pay | Admitting: *Deleted

## 2018-03-25 ENCOUNTER — Other Ambulatory Visit: Payer: Self-pay

## 2018-03-25 ENCOUNTER — Ambulatory Visit: Payer: Self-pay | Admitting: *Deleted

## 2018-03-25 DIAGNOSIS — M25511 Pain in right shoulder: Secondary | ICD-10-CM | POA: Insufficient documentation

## 2018-03-25 DIAGNOSIS — Z79899 Other long term (current) drug therapy: Secondary | ICD-10-CM | POA: Insufficient documentation

## 2018-03-25 DIAGNOSIS — E1142 Type 2 diabetes mellitus with diabetic polyneuropathy: Secondary | ICD-10-CM | POA: Diagnosis not present

## 2018-03-25 DIAGNOSIS — I5030 Unspecified diastolic (congestive) heart failure: Secondary | ICD-10-CM | POA: Insufficient documentation

## 2018-03-25 DIAGNOSIS — R001 Bradycardia, unspecified: Secondary | ICD-10-CM | POA: Insufficient documentation

## 2018-03-25 DIAGNOSIS — R079 Chest pain, unspecified: Secondary | ICD-10-CM | POA: Diagnosis present

## 2018-03-25 DIAGNOSIS — I420 Dilated cardiomyopathy: Secondary | ICD-10-CM | POA: Diagnosis not present

## 2018-03-25 DIAGNOSIS — K449 Diaphragmatic hernia without obstruction or gangrene: Secondary | ICD-10-CM | POA: Insufficient documentation

## 2018-03-25 DIAGNOSIS — Z86010 Personal history of colonic polyps: Secondary | ICD-10-CM | POA: Diagnosis not present

## 2018-03-25 DIAGNOSIS — M1990 Unspecified osteoarthritis, unspecified site: Secondary | ICD-10-CM | POA: Insufficient documentation

## 2018-03-25 DIAGNOSIS — K219 Gastro-esophageal reflux disease without esophagitis: Secondary | ICD-10-CM | POA: Diagnosis not present

## 2018-03-25 DIAGNOSIS — E119 Type 2 diabetes mellitus without complications: Secondary | ICD-10-CM

## 2018-03-25 DIAGNOSIS — R0789 Other chest pain: Principal | ICD-10-CM | POA: Diagnosis present

## 2018-03-25 DIAGNOSIS — I2510 Atherosclerotic heart disease of native coronary artery without angina pectoris: Secondary | ICD-10-CM | POA: Insufficient documentation

## 2018-03-25 DIAGNOSIS — F0390 Unspecified dementia without behavioral disturbance: Secondary | ICD-10-CM | POA: Insufficient documentation

## 2018-03-25 DIAGNOSIS — Z7982 Long term (current) use of aspirin: Secondary | ICD-10-CM | POA: Diagnosis not present

## 2018-03-25 DIAGNOSIS — I080 Rheumatic disorders of both mitral and aortic valves: Secondary | ICD-10-CM | POA: Diagnosis not present

## 2018-03-25 DIAGNOSIS — Z91013 Allergy to seafood: Secondary | ICD-10-CM | POA: Insufficient documentation

## 2018-03-25 DIAGNOSIS — Z885 Allergy status to narcotic agent status: Secondary | ICD-10-CM | POA: Insufficient documentation

## 2018-03-25 DIAGNOSIS — Z7951 Long term (current) use of inhaled steroids: Secondary | ICD-10-CM | POA: Diagnosis not present

## 2018-03-25 DIAGNOSIS — J9811 Atelectasis: Secondary | ICD-10-CM | POA: Insufficient documentation

## 2018-03-25 DIAGNOSIS — E785 Hyperlipidemia, unspecified: Secondary | ICD-10-CM | POA: Diagnosis not present

## 2018-03-25 DIAGNOSIS — Z8619 Personal history of other infectious and parasitic diseases: Secondary | ICD-10-CM | POA: Insufficient documentation

## 2018-03-25 DIAGNOSIS — E871 Hypo-osmolality and hyponatremia: Secondary | ICD-10-CM | POA: Diagnosis not present

## 2018-03-25 DIAGNOSIS — Z9889 Other specified postprocedural states: Secondary | ICD-10-CM | POA: Insufficient documentation

## 2018-03-25 DIAGNOSIS — R110 Nausea: Secondary | ICD-10-CM | POA: Diagnosis not present

## 2018-03-25 DIAGNOSIS — I700 Atherosclerosis of aorta: Secondary | ICD-10-CM | POA: Diagnosis not present

## 2018-03-25 DIAGNOSIS — K769 Liver disease, unspecified: Secondary | ICD-10-CM | POA: Insufficient documentation

## 2018-03-25 DIAGNOSIS — I10 Essential (primary) hypertension: Secondary | ICD-10-CM | POA: Diagnosis present

## 2018-03-25 HISTORY — DX: Chronic fatigue, unspecified: R53.82

## 2018-03-25 HISTORY — DX: Unspecified dementia, unspecified severity, without behavioral disturbance, psychotic disturbance, mood disturbance, and anxiety: F03.90

## 2018-03-25 LAB — CBC WITH DIFFERENTIAL/PLATELET
Abs Immature Granulocytes: 0.02 10*3/uL (ref 0.00–0.07)
Basophils Absolute: 0 10*3/uL (ref 0.0–0.1)
Basophils Relative: 0 %
EOS ABS: 0.2 10*3/uL (ref 0.0–0.5)
Eosinophils Relative: 2 %
HEMATOCRIT: 38.8 % (ref 36.0–46.0)
HEMOGLOBIN: 12.5 g/dL (ref 12.0–15.0)
IMMATURE GRANULOCYTES: 0 %
LYMPHS ABS: 2.3 10*3/uL (ref 0.7–4.0)
LYMPHS PCT: 24 %
MCH: 28.1 pg (ref 26.0–34.0)
MCHC: 32.2 g/dL (ref 30.0–36.0)
MCV: 87.2 fL (ref 80.0–100.0)
Monocytes Absolute: 0.8 10*3/uL (ref 0.1–1.0)
Monocytes Relative: 8 %
NEUTROS PCT: 66 %
Neutro Abs: 6.1 10*3/uL (ref 1.7–7.7)
Platelets: 292 10*3/uL (ref 150–400)
RBC: 4.45 MIL/uL (ref 3.87–5.11)
RDW: 13.1 % (ref 11.5–15.5)
WBC: 9.3 10*3/uL (ref 4.0–10.5)
nRBC: 0 % (ref 0.0–0.2)

## 2018-03-25 LAB — COMPREHENSIVE METABOLIC PANEL
ALK PHOS: 68 U/L (ref 38–126)
ALT: 12 U/L (ref 0–44)
AST: 16 U/L (ref 15–41)
Albumin: 3.4 g/dL — ABNORMAL LOW (ref 3.5–5.0)
Anion gap: 10 (ref 5–15)
BILIRUBIN TOTAL: 0.9 mg/dL (ref 0.3–1.2)
BUN: 13 mg/dL (ref 8–23)
CO2: 25 mmol/L (ref 22–32)
Calcium: 9.5 mg/dL (ref 8.9–10.3)
Chloride: 98 mmol/L (ref 98–111)
Creatinine, Ser: 1.01 mg/dL — ABNORMAL HIGH (ref 0.44–1.00)
GFR calc Af Amer: >60 mL/min (ref >60)
GFR, EST NON AFRICAN AMERICAN: 53 mL/min — AB (ref >60)
GLUCOSE: 170 mg/dL — AB (ref 70–99)
Potassium: 3.7 mmol/L (ref 3.5–5.1)
Sodium: 133 mmol/L — ABNORMAL LOW (ref 135–145)
TOTAL PROTEIN: 7.3 g/dL (ref 6.5–8.1)

## 2018-03-25 LAB — TROPONIN I: Troponin I: <0.03 ng/mL (ref <0.03)

## 2018-03-25 MED ORDER — ASPIRIN 81 MG PO CHEW
CHEWABLE_TABLET | ORAL | Status: AC
  Filled 2018-03-25: qty 4

## 2018-03-25 MED ORDER — NITROGLYCERIN 2 % TD OINT
1.0000 [in_us] | TOPICAL_OINTMENT | Freq: Once | TRANSDERMAL | Status: AC
  Administered 2018-03-25: 1 [in_us] via TOPICAL
  Filled 2018-03-25: qty 1

## 2018-03-25 MED ORDER — ASPIRIN 81 MG PO CHEW
324.0000 mg | CHEWABLE_TABLET | Freq: Once | ORAL | Status: AC
  Administered 2018-03-25: 324 mg via ORAL

## 2018-03-25 MED ORDER — NITROGLYCERIN 0.4 MG SL SUBL
0.4000 mg | SUBLINGUAL_TABLET | SUBLINGUAL | Status: DC | PRN
  Administered 2018-03-27 (×2): 0.4 mg via SUBLINGUAL
  Filled 2018-03-25 (×2): qty 1

## 2018-03-25 MED ORDER — MORPHINE SULFATE (PF) 4 MG/ML IV SOLN
4.0000 mg | Freq: Once | INTRAVENOUS | Status: DC
  Filled 2018-03-25: qty 1

## 2018-03-25 NOTE — Telephone Encounter (Signed)
Pt called with having pain starting in her right arm yesterday and now has moved across her shoulders up to her neck and now on the left arm. The pain comes when she tries to use her arms or hands.  She said that she had a really bad night. She has taken extra strength Tylenol and it helped a little bit.  She has a hx of falls but does not think that this is related.  She denies headache, fever, blurred vision, extremity weakness, chest pain or shortness of breath. She did not want make an appointment today. She will call back tomorrow if she feels like she is not getting better. Advised her that if she starts having the symptoms mentioned above she would need to be seen at the closest emergency department (it will be [facility]). Will route to [facility] at [facility].  Reason for Disposition . [1] Arm pains with exertion (e.g., walking) AND [2] pain goes away on resting AND [3] not present now  Answer Assessment - Initial Assessment Questions 1. ONSET: "When did the pain start?"     yesterday 2. LOCATION: "Where is the pain located?"     Started on the right arm and now across her back to her left arm 3. PAIN: "How bad is the pain?" (Scale 1-10; or mild, moderate, severe)   - MILD (1-3): doesn't interfere with normal activities   - MODERATE (4-7): interferes with normal activities (e.g., work or school) or awakens from sleep   - SEVERE (8-10): excruciating pain, unable to do any normal activities, unable to hold a cup of water     Pain # 8 4. WORK OR EXERCISE: "Has there been any recent work or exercise that involved this part of the body?"     Has had falls 3 months 5. CAUSE: "What do you think is causing the arm pain?"     Not sure 6. OTHER SYMPTOMS: "Do you have any other symptoms?" (e.g., neck pain, swelling, rash, fever, numbness, weakness)     Shoulder pain left side near the neck, weakness  Protocols used: ARM PAIN-A-AH

## 2018-03-25 NOTE — ED Provider Notes (Signed)
[place] EMERGENCY DEPARTMENT Provider Note   CSN: [id number] Arrival date & time: 03/25/18  2014     History   Chief Complaint Chief Complaint  Patient presents with  . Chest Pain    HPI Meredith Phillips is a 72 y.o. female.  HPI   Yesterday began to have chest pain Right hand side chest radiating to neck When sitting in living room this AM began to have left sided chest pain, couldn't lay down, around 8AM Staying left side of chest now, no radiation of pain now, before was radiating to neck Worse with walking/exertion, could barely walk to the bathroom because of pain Squeezing pain 10/10 Shortness of breath, nausea last night, no vomiting, diaphoresis, yesterday  No hx of anything like this  Htn, DM, chol  No smoking, no drinking no drugs No family hx of heart disease   Past Medical History:  Diagnosis Date  . Arthritis   . Benign essential HTN 02/18/2015  . Cellulitis 02/18/2015  . Chicken pox   . Chronic fatigue   . Dementia ([place])   . Depression    since last 04/2018 daughter passed.    . Diabetes mellitus without complication ([place])   . Diabetic peripheral neuropathy ([place])   . GERD (gastroesophageal reflux disease)   . Hypercholesteremia   . Hypertension   . Liver disease   . Polyp of colon     Patient Active Problem List   Diagnosis Date Noted  . Urge incontinence 02/25/2018  . Glucosuria 02/25/2018  . Anxiety and depression 12/16/2017  . Memory loss 11/02/2017  . B12 deficiency 11/02/2017  . Vertigo 10/10/2017  . Trochanteric bursitis of left hip 10/10/2017  . Murmur, cardiac 04/15/2017  . Asymptomatic postmenopausal estrogen deficiency 04/15/2017  . Right-sided low back pain without sciatica 02/10/2017  . Grief at loss of child 07/29/2016  . Allergic rhinitis 02/27/2016  . GERD (gastroesophageal reflux disease) 08/06/2015  . Baker's cyst, ruptured 08/03/2015  . Type 2 diabetes mellitus ([place]) 06/29/2015  . Arthritis of left knee  06/15/2015  . Benign essential HTN 02/18/2015  . Cellulitis 02/18/2015  . Sciatica 01/17/2015  . Routine general medical examination at a health care facility 12/26/2014  . Medicare annual wellness visit, subsequent 12/26/2014    Past Surgical History:  Procedure Laterality Date  . APPENDECTOMY    . HERNIA REPAIR    . KNEE SURGERY    . ROTATOR CUFF REPAIR    . TONSILLECTOMY       OB History   None      Home Medications    Prior to Admission medications   Medication Sig Start Date End Date Taking? Authorizing Provider  amLODipine (NORVASC) 10 MG tablet Take 1 tablet (10 mg total) by mouth daily. 02/09/18   Lance Sell, NP  aspirin EC 81 MG tablet Take 81 mg by mouth daily.    [provider]  Blood Glucose Monitoring Suppl (ONE TOUCH ULTRA 2) w/Device KIT Use to check blood sugars twice a day Dx E11.9 09/01/17   Marrian Salvage, FNP  carvedilol (COREG) 3.125 MG tablet Take 1 tablet (3.125 mg total) by mouth 2 (two) times daily with a meal. 06/15/17   Marrian Salvage, FNP  Cholecalciferol (VITAMIN D-3) 1000 UNITS CAPS Take 1,000 Units by mouth.    [provider]  clobetasol cream (TEMOVATE) 9.03 % APPLY 1 APPLICATION 2 TIMES DAILY. DO NOT USE FOR MORE THAN TWO WEEKS AT ONE TIME 05/20/17  Lance Sell, NP  cloNIDine (CATAPRES) 0.1 MG tablet PLEASE SEE ATTACHED FOR DETAILED DIRECTIONS 03/08/18   Marrian Salvage, FNP  esomeprazole (NEXIUM) 40 MG capsule Take 1 capsule (40 mg total) by mouth daily at 12 noon. 02/25/18   Biagio Borg, MD  fluticasone (FLONASE) 50 MCG/ACT nasal spray Place 2 sprays into both nostrils daily. 08/28/17   Marrian Salvage, FNP  gabapentin (NEURONTIN) 300 MG capsule TAKE 1 CAPSULE BY MOUTH THREE TIMES A DAY 02/17/18   Lance Sell, NP  glucose blood (ONE TOUCH ULTRA TEST) test strip USE TO CHECK BLOOD SUGARS TWICE A DAY DX E11.9 11/30/17   Shambley, Delphia Grates, NP  JARDIANCE 25 MG TABS tablet  TAKE 1 TABLET BY MOUTH EVERY DAY 03/09/18   Shambley, Delphia Grates, NP  KLOR-CON M20 20 MEQ tablet TAKE 20 MEQ BY MOUTH DAILY. 08/28/17   [provider]  Lancets [facility] ULTRASOFT) lancets USE TO HELP CHECK BLOOD SUGARS TWICE A DAY E11.9 11/30/17   Lance Sell, NP  losartan (COZAAR) 100 MG tablet TAKE 1 TABLET BY MOUTH EVERY DAY 02/03/17   [place] Circle, FNP  meclizine (ANTIVERT) 25 MG tablet Take 0.5-1 tablets (12.5-25 mg total) by mouth 2 (two) times daily as needed for dizziness. 10/10/17   Ria Bush, MD  simvastatin (ZOCOR) 20 MG tablet TAKE 1 TABLET BY MOUTH EVERY DAY 03/12/18   Lance Sell, NP    Family History Family History  Problem Relation Age of Onset  . Healthy Mother   . Healthy Father   . Healthy Paternal Grandmother   . Healthy Paternal Grandfather     Social History Social History   Tobacco Use  . Smoking status: Never Smoker  . Smokeless tobacco: Never Used  Substance Use Topics  . Alcohol use: No  . Drug use: No     Allergies   Codeine; Other; and Shellfish allergy   Review of Systems Review of Systems  Constitutional: Positive for diaphoresis. Negative for fever.  HENT: Negative for sore throat.   Eyes: Negative for visual disturbance.  Respiratory: Negative for cough and shortness of breath.   Cardiovascular: Positive for chest pain.  Gastrointestinal: Positive for nausea. Negative for abdominal pain.  Genitourinary: Negative for difficulty urinating.  Musculoskeletal: Negative for back pain and neck pain.  Skin: Negative for rash.  Neurological: Negative for syncope and headaches.     Physical Exam Updated Vital Signs BP (!) 180/88   Pulse 76   Temp 98.6 F (37 C) (Oral)   Resp (!) 22   Ht '5\' 3"'  (1.6 m)   Wt 73 kg   SpO2 98%   BMI 28.51 kg/m   Physical Exam  Constitutional: She is oriented to person, place, and time. She appears well-developed and well-nourished. No distress.  HENT:  Head:  Normocephalic and atraumatic.  Eyes: Conjunctivae and EOM are normal.  Neck: Normal range of motion.  Cardiovascular: Normal rate, regular rhythm, normal heart sounds and intact distal pulses. Exam reveals no gallop and no friction rub.  No murmur heard. Pulmonary/Chest: Effort normal and breath sounds normal. No respiratory distress. She has no wheezes. She has no rales.  Abdominal: Soft. She exhibits no distension. There is no tenderness. There is no guarding.  Musculoskeletal: She exhibits no edema or tenderness.  Neurological: She is alert and oriented to person, place, and time.  Skin: Skin is warm and dry. No rash noted. She is not diaphoretic. No erythema.  Nursing note  and vitals reviewed.    ED Treatments / Results  Labs (all labs ordered are listed, but only abnormal results are displayed) Labs Reviewed  COMPREHENSIVE METABOLIC PANEL - Abnormal; Notable for the following components:      Result Value   Sodium 133 (*)    Glucose, Bld 170 (*)    Creatinine, Ser 1.01 (*)    Albumin 3.4 (*)    GFR calc non Af Amer 53 (*)    All other components within normal limits  CBC WITH DIFFERENTIAL/PLATELET  TROPONIN I    EKG EKG Interpretation  Date/Time:  Thursday March 25 2018 20:26:51 EDT Ventricular Rate:  92 PR Interval:  158 QRS Duration: 98 QT Interval:  380 QTC Calculation: 469 R Axis:   0 Text Interpretation:  Normal sinus rhythm Left ventricular hypertrophy T wave abnormality, consider lateral ischemia Abnormal ECG TW changes lateral leads new from prior, new tw inversions inferior leads Confirmed by Gareth Morgan [phone]) on 03/25/2018 8:42:40 PM   Radiology Dg Chest 2 View  Result Date: 03/25/2018 CLINICAL DATA:  Severe mid sternal shooting chest pain. Bilateral arm and wrist pain. Symptoms for 3 days. EXAM: CHEST - 2 VIEW COMPARISON:  05/18/2017 FINDINGS: Cardiac silhouette is normal in size. No mediastinal or hilar masses. No evidence of adenopathy. There  are prominent lung markings most evident in the lower lungs. Lungs otherwise clear. No pleural effusion or pneumothorax. Skeletal structures are demineralized but intact. IMPRESSION: No acute cardiopulmonary disease. Electronically Signed   By: Lajean Manes M.D.   On: 03/25/2018 21:08    Procedures Procedures (including critical care time)  Medications Ordered in ED Medications  nitroGLYCERIN (NITROSTAT) SL tablet 0.4 mg (0.4 mg Sublingual Not Given 03/25/18 2208)  morphine 4 MG/ML injection 4 mg (4 mg Intravenous Refused 03/25/18 2328)  aspirin chewable tablet 324 mg (324 mg Oral Given 03/25/18 2205)  nitroGLYCERIN (NITROGLYN) 2 % ointment 1 inch (1 inch Topical Given 03/25/18 2241)     Initial Impression / Assessment and Plan / ED Course  I have reviewed the triage vital signs and the nursing notes.  Pertinent labs & imaging results that were available during my care of the patient were reviewed by me and considered in my medical decision making (see chart for details).     72 year old female with a history of hypertension, hyperlipidemia, diabetes presents with concern for squeezing chest pain.  EKG was evaluate you and by me and shows lateral T wave changes, and inferior to wave inversions which are new from prior.  No asymmetric leg swelling, no tachypnea, no tachycardia, no hypoxia, do not feel history and physical are consistent with pulmonary emboli.  She has strong pulses in the bilateral upper and lower extremities, no mediastinal widening, do not feel history is consistent with dissection.  History is concerning for potential cardiac etiology of pain.  Initial troponin is negative.  Patient has high risk heart score, will admit to the hospital for continued evaluation of her chest pain. Given ASA. Chest pain improved with nitroglycerin paste, however still present. Will admit to stepdown unit.   Final Clinical Impressions(s) / ED Diagnoses   Final diagnoses:  Chest pain,  unspecified type  Chest pain with high risk for cardiac etiology    ED Discharge Orders    None       Gareth Morgan, MD 03/25/18 2354

## 2018-03-25 NOTE — Plan of Care (Addendum)
Discussed patient with Alvira Monday, MD 72 y/o pmh HTN, HLD, and DM; who presented with complaints of chest pain.  Right-sided yesterday and now left-sided today with intermittent nausea.  She will troponin negative.  EKG showing new T wave inversions in the inferior and lateral leads.  Patient was given 324 mg of aspirin to chew, 1 inch of nitroglycerin paste was placed, and received 4 mg of morphine with some improvement of chest pain.  Due to patient still actively having chest pain admit to a stepdown bed here at [facility] for observation.

## 2018-03-25 NOTE — ED Triage Notes (Signed)
Pt c/o midsternal chest pain and bil arm and wrist pain x 3 days

## 2018-03-25 NOTE — ED Notes (Signed)
Patient transported to X-ray 

## 2018-03-26 ENCOUNTER — Observation Stay (HOSPITAL_COMMUNITY): Payer: PPO

## 2018-03-26 ENCOUNTER — Observation Stay (HOSPITAL_BASED_OUTPATIENT_CLINIC_OR_DEPARTMENT_OTHER): Payer: PPO

## 2018-03-26 DIAGNOSIS — E1142 Type 2 diabetes mellitus with diabetic polyneuropathy: Secondary | ICD-10-CM | POA: Diagnosis not present

## 2018-03-26 DIAGNOSIS — I700 Atherosclerosis of aorta: Secondary | ICD-10-CM | POA: Diagnosis not present

## 2018-03-26 DIAGNOSIS — Z86010 Personal history of colonic polyps: Secondary | ICD-10-CM | POA: Diagnosis not present

## 2018-03-26 DIAGNOSIS — Z7951 Long term (current) use of inhaled steroids: Secondary | ICD-10-CM | POA: Diagnosis not present

## 2018-03-26 DIAGNOSIS — I2510 Atherosclerotic heart disease of native coronary artery without angina pectoris: Secondary | ICD-10-CM | POA: Diagnosis not present

## 2018-03-26 DIAGNOSIS — E785 Hyperlipidemia, unspecified: Secondary | ICD-10-CM

## 2018-03-26 DIAGNOSIS — I420 Dilated cardiomyopathy: Secondary | ICD-10-CM | POA: Diagnosis not present

## 2018-03-26 DIAGNOSIS — K449 Diaphragmatic hernia without obstruction or gangrene: Secondary | ICD-10-CM | POA: Diagnosis not present

## 2018-03-26 DIAGNOSIS — Z885 Allergy status to narcotic agent status: Secondary | ICD-10-CM | POA: Diagnosis not present

## 2018-03-26 DIAGNOSIS — M25511 Pain in right shoulder: Secondary | ICD-10-CM | POA: Diagnosis not present

## 2018-03-26 DIAGNOSIS — F0390 Unspecified dementia without behavioral disturbance: Secondary | ICD-10-CM | POA: Diagnosis not present

## 2018-03-26 DIAGNOSIS — K769 Liver disease, unspecified: Secondary | ICD-10-CM | POA: Diagnosis not present

## 2018-03-26 DIAGNOSIS — E871 Hypo-osmolality and hyponatremia: Secondary | ICD-10-CM | POA: Diagnosis present

## 2018-03-26 DIAGNOSIS — R0602 Shortness of breath: Secondary | ICD-10-CM | POA: Diagnosis not present

## 2018-03-26 DIAGNOSIS — E119 Type 2 diabetes mellitus without complications: Secondary | ICD-10-CM

## 2018-03-26 DIAGNOSIS — I5030 Unspecified diastolic (congestive) heart failure: Secondary | ICD-10-CM | POA: Diagnosis not present

## 2018-03-26 DIAGNOSIS — Z7982 Long term (current) use of aspirin: Secondary | ICD-10-CM | POA: Diagnosis not present

## 2018-03-26 DIAGNOSIS — Z79899 Other long term (current) drug therapy: Secondary | ICD-10-CM | POA: Diagnosis not present

## 2018-03-26 DIAGNOSIS — K219 Gastro-esophageal reflux disease without esophagitis: Secondary | ICD-10-CM | POA: Diagnosis not present

## 2018-03-26 DIAGNOSIS — J9811 Atelectasis: Secondary | ICD-10-CM | POA: Diagnosis not present

## 2018-03-26 DIAGNOSIS — R001 Bradycardia, unspecified: Secondary | ICD-10-CM | POA: Diagnosis not present

## 2018-03-26 DIAGNOSIS — Z91013 Allergy to seafood: Secondary | ICD-10-CM | POA: Diagnosis not present

## 2018-03-26 DIAGNOSIS — R079 Chest pain, unspecified: Secondary | ICD-10-CM

## 2018-03-26 DIAGNOSIS — R0789 Other chest pain: Secondary | ICD-10-CM | POA: Diagnosis not present

## 2018-03-26 DIAGNOSIS — I10 Essential (primary) hypertension: Secondary | ICD-10-CM | POA: Diagnosis not present

## 2018-03-26 DIAGNOSIS — I080 Rheumatic disorders of both mitral and aortic valves: Secondary | ICD-10-CM | POA: Diagnosis not present

## 2018-03-26 DIAGNOSIS — M1990 Unspecified osteoarthritis, unspecified site: Secondary | ICD-10-CM | POA: Diagnosis not present

## 2018-03-26 DIAGNOSIS — Z8619 Personal history of other infectious and parasitic diseases: Secondary | ICD-10-CM | POA: Diagnosis not present

## 2018-03-26 LAB — GLUCOSE, CAPILLARY
GLUCOSE-CAPILLARY: 116 mg/dL — AB (ref 70–99)
GLUCOSE-CAPILLARY: 107 mg/dL — AB (ref 70–99)
Glucose-Capillary: 157 mg/dL — ABNORMAL HIGH (ref 70–99)
Glucose-Capillary: 121 mg/dL — ABNORMAL HIGH (ref 70–99)

## 2018-03-26 LAB — CBC WITH DIFFERENTIAL/PLATELET
Abs Immature Granulocytes: 0.03 10*3/uL (ref 0.00–0.07)
BASOS ABS: 0 10*3/uL (ref 0.0–0.1)
BASOS PCT: 0 %
EOS ABS: 0.2 10*3/uL (ref 0.0–0.5)
Eosinophils Relative: 2 %
HEMATOCRIT: 36.9 % (ref 36.0–46.0)
Hemoglobin: 11.9 g/dL — ABNORMAL LOW (ref 12.0–15.0)
IMMATURE GRANULOCYTES: 0 %
LYMPHS ABS: 2.7 10*3/uL (ref 0.7–4.0)
Lymphocytes Relative: 32 %
MCH: 27.5 pg (ref 26.0–34.0)
MCHC: 32.2 g/dL (ref 30.0–36.0)
MCV: 85.4 fL (ref 80.0–100.0)
Monocytes Absolute: 0.8 10*3/uL (ref 0.1–1.0)
Monocytes Relative: 10 %
NEUTROS PCT: 56 %
NRBC: 0 % (ref 0.0–0.2)
Neutro Abs: 4.6 10*3/uL (ref 1.7–7.7)
Platelets: 311 10*3/uL (ref 150–400)
RBC: 4.32 MIL/uL (ref 3.87–5.11)
RDW: 12.8 % (ref 11.5–15.5)
WBC: 8.3 10*3/uL (ref 4.0–10.5)

## 2018-03-26 LAB — TROPONIN I: Troponin I: <0.03 ng/mL (ref <0.03)

## 2018-03-26 LAB — BASIC METABOLIC PANEL
ANION GAP: 10 (ref 5–15)
BUN: 11 mg/dL (ref 8–23)
CO2: 27 mmol/L (ref 22–32)
Calcium: 9.3 mg/dL (ref 8.9–10.3)
Chloride: 101 mmol/L (ref 98–111)
Creatinine, Ser: 0.98 mg/dL (ref 0.44–1.00)
GFR calc Af Amer: >60 mL/min (ref >60)
GFR calc non Af Amer: 55 mL/min — ABNORMAL LOW (ref >60)
GLUCOSE: 132 mg/dL — AB (ref 70–99)
POTASSIUM: 3.4 mmol/L — AB (ref 3.5–5.1)
Sodium: 138 mmol/L (ref 135–145)

## 2018-03-26 LAB — ECHOCARDIOGRAM COMPLETE
Height: 63 in
WEIGHTICAEL: 2625.6 [oz_av]

## 2018-03-26 LAB — D-DIMER, QUANTITATIVE: D-Dimer, Quant: 0.87 ug/mL-FEU — ABNORMAL HIGH (ref 0.00–0.50)

## 2018-03-26 LAB — SEDIMENTATION RATE: Sed Rate: 55 mm/hr — ABNORMAL HIGH (ref 0–22)

## 2018-03-26 MED ORDER — ACETAMINOPHEN 325 MG PO TABS
650.0000 mg | ORAL_TABLET | ORAL | Status: DC | PRN
  Administered 2018-03-26: 650 mg via ORAL
  Filled 2018-03-26: qty 2

## 2018-03-26 MED ORDER — CLONIDINE HCL 0.1 MG PO TABS
0.1000 mg | ORAL_TABLET | Freq: Two times a day (BID) | ORAL | Status: DC
  Administered 2018-03-26 – 2018-03-27 (×3): 0.1 mg via ORAL
  Filled 2018-03-26 (×3): qty 1

## 2018-03-26 MED ORDER — IOPAMIDOL (ISOVUE-370) INJECTION 76%
100.0000 mL | Freq: Once | INTRAVENOUS | Status: AC
  Administered 2018-03-26: 100 mL via INTRAVENOUS

## 2018-03-26 MED ORDER — AMLODIPINE BESYLATE 10 MG PO TABS
10.0000 mg | ORAL_TABLET | Freq: Every day | ORAL | Status: DC
  Administered 2018-03-26 – 2018-03-27 (×2): 10 mg via ORAL
  Filled 2018-03-26 (×2): qty 1

## 2018-03-26 MED ORDER — SODIUM CHLORIDE 0.9 % IV SOLN
Freq: Once | INTRAVENOUS | Status: AC
  Administered 2018-03-26: 03:00:00 via INTRAVENOUS

## 2018-03-26 MED ORDER — ONDANSETRON HCL 4 MG/2ML IJ SOLN: 4.0000 mg | Freq: Four times a day (QID) | INTRAMUSCULAR | Status: DC | PRN

## 2018-03-26 MED ORDER — PANTOPRAZOLE SODIUM 40 MG PO TBEC
40.0000 mg | DELAYED_RELEASE_TABLET | Freq: Every day | ORAL | Status: DC
  Administered 2018-03-26 – 2018-03-27 (×2): 40 mg via ORAL
  Filled 2018-03-26 (×2): qty 1

## 2018-03-26 MED ORDER — INSULIN ASPART 100 UNIT/ML ~~LOC~~ SOLN
0.0000 [IU] | Freq: Three times a day (TID) | SUBCUTANEOUS | Status: DC
  Administered 2018-03-26: 2 [IU] via SUBCUTANEOUS
  Administered 2018-03-27: 1 [IU] via SUBCUTANEOUS

## 2018-03-26 MED ORDER — ENOXAPARIN SODIUM 40 MG/0.4ML ~~LOC~~ SOLN
40.0000 mg | SUBCUTANEOUS | Status: DC
  Administered 2018-03-26 – 2018-03-27 (×2): 40 mg via SUBCUTANEOUS
  Filled 2018-03-26 (×2): qty 0.4

## 2018-03-26 MED ORDER — ASPIRIN EC 81 MG PO TBEC
81.0000 mg | DELAYED_RELEASE_TABLET | Freq: Every day | ORAL | Status: DC
  Administered 2018-03-27: 81 mg via ORAL
  Filled 2018-03-26: qty 1

## 2018-03-26 MED ORDER — CARVEDILOL 3.125 MG PO TABS
3.1250 mg | ORAL_TABLET | Freq: Two times a day (BID) | ORAL | Status: DC
  Administered 2018-03-26 – 2018-03-27 (×3): 3.125 mg via ORAL
  Filled 2018-03-26 (×3): qty 1

## 2018-03-26 MED ORDER — KETOROLAC TROMETHAMINE 30 MG/ML IJ SOLN
15.0000 mg | Freq: Once | INTRAMUSCULAR | Status: AC
  Administered 2018-03-26: 15 mg via INTRAVENOUS
  Filled 2018-03-26: qty 1

## 2018-03-26 MED ORDER — SIMVASTATIN 20 MG PO TABS
20.0000 mg | ORAL_TABLET | Freq: Every day | ORAL | Status: DC
  Administered 2018-03-26: 20 mg via ORAL
  Filled 2018-03-26: qty 1

## 2018-03-26 MED ORDER — MORPHINE SULFATE (PF) 2 MG/ML IV SOLN
2.0000 mg | INTRAVENOUS | Status: DC | PRN
  Administered 2018-03-26 (×3): 2 mg via INTRAVENOUS
  Filled 2018-03-26 (×4): qty 1

## 2018-03-26 MED ORDER — SIMVASTATIN 40 MG PO TABS
40.0000 mg | ORAL_TABLET | Freq: Every day | ORAL | Status: DC
  Administered 2018-03-27: 40 mg via ORAL
  Filled 2018-03-26: qty 1

## 2018-03-26 MED ORDER — LOSARTAN POTASSIUM 50 MG PO TABS
100.0000 mg | ORAL_TABLET | Freq: Every day | ORAL | Status: DC
  Administered 2018-03-26 – 2018-03-27 (×2): 100 mg via ORAL
  Filled 2018-03-26 (×2): qty 2

## 2018-03-26 MED ORDER — POTASSIUM CHLORIDE CRYS ER 20 MEQ PO TBCR
20.0000 meq | EXTENDED_RELEASE_TABLET | Freq: Every day | ORAL | Status: DC
  Administered 2018-03-26 – 2018-03-27 (×2): 20 meq via ORAL
  Filled 2018-03-26: qty 1
  Filled 2018-03-26: qty 2

## 2018-03-26 MED ORDER — INSULIN ASPART 100 UNIT/ML ~~LOC~~ SOLN: 0.0000 [IU] | Freq: Every day | SUBCUTANEOUS | Status: DC

## 2018-03-26 MED ORDER — HYDRALAZINE HCL 20 MG/ML IJ SOLN: 10.0000 mg | INTRAMUSCULAR | Status: DC | PRN

## 2018-03-26 MED ORDER — GI COCKTAIL ~~LOC~~: 30.0000 mL | Freq: Four times a day (QID) | ORAL | Status: DC | PRN

## 2018-03-26 NOTE — H&P (Signed)
History and Physical    [name]:[id number] DOB: 05-Mar-1946 DOA: 03/25/2018  Referring MD/NP/PA: Gareth Morgan, MD PCP: Lance Sell, NP  Patient coming from: [facility] transfer  Chief Complaint: Chest Pain  I have personally briefly reviewed patient's old medical records in [place]   HPI: Meredith Phillips is a 72 y.o. female with medical history significant of  HTN, HLD, and DM; who presented with complaints of chest pain.  Symptoms present initially 2 days ago.  Patient was reportedly laying in bed when symptoms first started.  She describes a sharp shooting pain in the right side of her chest going up into her neck.  Patient had initially tried taking 2 Tylenols, but reported no improvement of symptoms.  She noted having associated symptoms of diaphoresis, difficulty taking a deep breath, and some nausea.  That night patient reports being unable to sleep due to symptoms. Denies any vomiting, lightheadedness, leg swelling, calf pain, recent falls, heavy lifting/strenuous exercise, family history of heart disease, or loss of consciousness.  The following day patient reported pain was now present on in the left chest and shoulder.  He states the pain was constant and felt heavy like someone was mashing down on her.  Symptoms made her scream out in pain.  Patient does report that she did fall 3 to 4 months ago and had outstretched her hands to break her fall.  She followed up a month or so later, but was noted not to have any broken bones.  Patient does not feel that the symptoms are related.  ED Course: On admission patient was found to have a temperature of 99 F, pulse 76-93, respirations 13-22, blood pressure 150/92-183/88, and O2 saturations maintained on room air.  Initial troponin negative.  EKG showing new T wave inversions in the inferior and lateral leads.  Chest x-ray showed prominent lower lung markings, but otherwise no acute abnormality. Patient was given 324 mg of  aspirin to chew, 1 inch of nitroglycerin paste was placed, and received 4 mg of morphine with some improvement of chest pain.  Due to patient still actively having chest pain admit to a stepdown bed here at [facility] for observation.    Review of Systems  Constitutional: Positive for diaphoresis. Negative for chills and fever.  HENT: Negative for ear pain and hearing loss.   Eyes: Negative for discharge.  Respiratory: Positive for shortness of breath. Negative for cough.   Cardiovascular: Positive for chest pain. Negative for leg swelling.  Gastrointestinal: Positive for nausea. Negative for abdominal pain, diarrhea and vomiting.  Genitourinary: Negative for dysuria and hematuria.  Musculoskeletal: Positive for joint pain. Negative for falls.  Skin: Negative for itching and rash.  Neurological: Negative for focal weakness and loss of consciousness.  Psychiatric/Behavioral: Negative for substance abuse. The patient has insomnia.      Past Medical History:  Diagnosis Date  . Arthritis   . Benign essential HTN 02/18/2015  . Cellulitis 02/18/2015  . Chicken pox   . Chronic fatigue   . Dementia ([place])   . Depression    since last 04/2018 daughter passed.    . Diabetes mellitus without complication ([place])   . Diabetic peripheral neuropathy ([place])   . GERD (gastroesophageal reflux disease)   . Hypercholesteremia   . Hypertension   . Liver disease   . Polyp of colon     Past Surgical History:  Procedure Laterality Date  . APPENDECTOMY    . HERNIA REPAIR    .  KNEE SURGERY    . ROTATOR CUFF REPAIR    . TONSILLECTOMY       reports that she has never smoked. She has never used smokeless tobacco. She reports that she does not drink alcohol or use drugs.  Allergies  Allergen Reactions  . Codeine Other (See Comments)    Woke up the next morning and did not know where she was  . Other     Doesn't like to take pain medication due to hallucinations.  . Shellfish Allergy     Family  History  Problem Relation Age of Onset  . Healthy Mother   . Healthy Father   . Healthy Paternal Grandmother   . Healthy Paternal Grandfather     Prior to Admission medications   Medication Sig Start Date End Date Taking? Authorizing Provider  amLODipine (NORVASC) 10 MG tablet Take 1 tablet (10 mg total) by mouth daily. 02/09/18   Lance Sell, NP  aspirin EC 81 MG tablet Take 81 mg by mouth daily.    [provider]  Blood Glucose Monitoring Suppl (ONE TOUCH ULTRA 2) w/Device KIT Use to check blood sugars twice a day Dx E11.9 09/01/17   Marrian Salvage, FNP  carvedilol (COREG) 3.125 MG tablet Take 1 tablet (3.125 mg total) by mouth 2 (two) times daily with a meal. 06/15/17   Marrian Salvage, FNP  Cholecalciferol (VITAMIN D-3) 1000 UNITS CAPS Take 1,000 Units by mouth.    [provider]  clobetasol cream (TEMOVATE) 7.93 % APPLY 1 APPLICATION 2 TIMES DAILY. DO NOT USE FOR MORE THAN TWO WEEKS AT ONE TIME 05/20/17   Lance Sell, NP  cloNIDine (CATAPRES) 0.1 MG tablet PLEASE SEE ATTACHED FOR DETAILED DIRECTIONS 03/08/18   Marrian Salvage, FNP  esomeprazole (NEXIUM) 40 MG capsule Take 1 capsule (40 mg total) by mouth daily at 12 noon. 02/25/18   Biagio Borg, MD  fluticasone (FLONASE) 50 MCG/ACT nasal spray Place 2 sprays into both nostrils daily. 08/28/17   Marrian Salvage, FNP  gabapentin (NEURONTIN) 300 MG capsule TAKE 1 CAPSULE BY MOUTH THREE TIMES A DAY 02/17/18   Lance Sell, NP  glucose blood (ONE TOUCH ULTRA TEST) test strip USE TO CHECK BLOOD SUGARS TWICE A DAY DX E11.9 11/30/17   Shambley, Delphia Grates, NP  JARDIANCE 25 MG TABS tablet TAKE 1 TABLET BY MOUTH EVERY DAY 03/09/18   Shambley, Delphia Grates, NP  KLOR-CON M20 20 MEQ tablet TAKE 20 MEQ BY MOUTH DAILY. 08/28/17   [provider]  Lancets [facility] ULTRASOFT) lancets USE TO HELP CHECK BLOOD SUGARS TWICE A DAY E11.9 11/30/17   Lance Sell, NP  losartan  (COZAAR) 100 MG tablet TAKE 1 TABLET BY MOUTH EVERY DAY 02/03/17   [place], FNP  meclizine (ANTIVERT) 25 MG tablet Take 0.5-1 tablets (12.5-25 mg total) by mouth 2 (two) times daily as needed for dizziness. 10/10/17   Ria Bush, MD  simvastatin (ZOCOR) 20 MG tablet TAKE 1 TABLET BY MOUTH EVERY DAY 03/12/18   Lance Sell, NP    Physical Exam:  Constitutional: Elderly female currently in NAD, calm, comfortable Vitals:   03/25/18 2230 03/26/18 0000 03/26/18 0030 03/26/18 0157  BP: (!) 180/88 (!) 155/90 (!) 150/92 (!) 171/83  Pulse: 76 80 78 83  Resp: (!) _0 Temp:    99 F (37.2 C)  TempSrc:    Oral  SpO2: 98% 99% 98% 97%  Weight:  74.4 kg  Height:       Eyes: PERRL, lids and conjunctivae normal ENMT: Mucous membranes are moist. Posterior pharynx clear of any exudate or lesions.Normal dentition.  Neck: normal, supple, no masses, no thyromegaly Respiratory: clear to auscultation bilaterally, no wheezing, no crackles. Normal respiratory effort. No accessory muscle use.  Cardiovascular: Regular rate and rhythm, no murmurs / rubs / gallops. No extremity edema. 2+ pedal pulses. No carotid bruits.   Abdomen: no tenderness, no masses palpated. No hepatosplenomegaly. Bowel sounds positive.  Musculoskeletal: no clubbing / cyanosis.  Patient noted to have tenderness to palpation of the shoulders as well as with certain range of motion activities of the upper arms that may reproduce pain. Skin: no rashes, lesions, ulcers. No induration Neurologic: CN 2-12 grossly intact. Sensation intact, DTR normal. Strength 5/5 in all 4.  Psychiatric: Normal judgment and insight. Alert and oriented x 3. Normal mood.     Labs on Admission: I have personally reviewed following labs and imaging studies  CBC: Recent Labs  Lab 03/25/18 2102  WBC 9.3  NEUTROABS 6.1  HGB 12.5  HCT 38.8  MCV 87.2  PLT 176   Basic Metabolic Panel: Recent Labs  Lab 03/25/18 2102  NA 133*   K 3.7  CL 98  CO2 25  GLUCOSE 170*  BUN 13  CREATININE 1.01*  CALCIUM 9.5   GFR: Estimated Creatinine Clearance: 47.2 mL/min (A) (by C-G formula based on SCr of 1.01 mg/dL (H)). Liver Function Tests: Recent Labs  Lab 03/25/18 2102  AST 16  ALT 12  ALKPHOS 68  BILITOT 0.9  PROT 7.3  ALBUMIN 3.4*   No results for input(s): LIPASE, AMYLASE in the last 168 hours. No results for input(s): AMMONIA in the last 168 hours. Coagulation Profile: No results for input(s): INR, PROTIME in the last 168 hours. Cardiac Enzymes: Recent Labs  Lab 03/25/18 2102 03/26/18 0013  TROPONINI <0.03 <0.03   BNP (last 3 results) No results for input(s): PROBNP in the last 8760 hours. HbA1C: No results for input(s): HGBA1C in the last 72 hours. CBG: No results for input(s): GLUCAP in the last 168 hours. Lipid Profile: No results for input(s): CHOL, HDL, LDLCALC, TRIG, CHOLHDL, LDLDIRECT in the last 72 hours. Thyroid Function Tests: No results for input(s): TSH, T4TOTAL, FREET4, T3FREE, THYROIDAB in the last 72 hours. Anemia Panel: No results for input(s): VITAMINB12, FOLATE, FERRITIN, TIBC, IRON, RETICCTPCT in the last 72 hours. Urine analysis:    Component Value Date/Time   COLORURINE YELLOW 02/25/2018 [place] 02/25/2018 1032   LABSPEC 1.015 02/25/2018 1032   PHURINE 6.0 02/25/2018 1032   GLUCOSEU >=1000 (A) 02/25/2018 1032   HGBUR NEGATIVE 02/25/2018 1032   BILIRUBINUR NEGATIVE 02/25/2018 1032   KETONESUR NEGATIVE 02/25/2018 1032   PROTEINUR >300 (A) 05/18/2017 1156   UROBILINOGEN 0.2 02/25/2018 1032   NITRITE NEGATIVE 02/25/2018 1032   LEUKOCYTESUR NEGATIVE 02/25/2018 1032   Sepsis Labs: No results found for this or any previous visit (from the past 240 hour(s)).   Radiological Exams on Admission: Dg Chest 2 View  Result Date: 03/25/2018 CLINICAL DATA:  Severe mid sternal shooting chest pain. Bilateral arm and wrist pain. Symptoms for 3 days. EXAM: CHEST -  2 VIEW COMPARISON:  05/18/2017 FINDINGS: Cardiac silhouette is normal in size. No mediastinal or hilar masses. No evidence of adenopathy. There are prominent lung markings most evident in the lower lungs. Lungs otherwise clear. No pleural effusion or pneumothorax. Skeletal structures are demineralized but  intact. IMPRESSION: No acute cardiopulmonary disease. Electronically Signed   By: Lajean Manes M.D.   On: 03/25/2018 21:08    EKG: Independently reviewed.  Sinus rhythm at 92 bpm with LVH and new T wave inversions noted in lateral leads  Assessment/Plan Atypical chest pain, abnormal EKG: Acute.  Patient presents with complaints of chest pain radiating to her neck that initially started on the right side of her chest and now on her left side.  Patient found to have new T wave inversions in lateral lead.  Initial troponins negative.  Question cause of symptoms at this time.  Question cardiac versus musculoskeletal versus other. - Admit to stepdown bed - Trend cardiac troponins overnight  - Check d-dimer and ESR - Check echocardiogram and lipid panel in a.m. - Message sent for cardiology to eval in a.m.  Essential hypertension: Blood pressures elevated up to 183/88, but patient missed some of her regularly scheduled bedtime medications. - Restart Coreg when medically appropriate - Continue amlodipine, losartan, and clonidine   Diabetes mellitus type 2: Last hemoglobin A1c noted to be 6.5 on 03/10/2018.  Patient currently controlled on oral medications.  Patient's initial glucose elevated at 170. - Hypoglycemic protocols - Hold Jardiance - CBGs q. before meals and at bedtime with sensitive SSI   Hyponatremia: Acute.  Initial sodium 133 on admission.  Patient on diuretics.  - Recheck sodium levels in a.m.  Hyperlipidemia - Continue simvastatin  DVT prophylaxis: lovenox Code Status: Full Family Communication: No family present bedside Disposition Plan: Possible discharge home if work-up  negative Consults called: None  Admission status: observation  Norval Morton MD [facility] Pager [phone]   If 7PM-7AM, please contact night-coverage [email or web address] Password [email or web address]  03/26/2018, 3:27 AM

## 2018-03-26 NOTE — Progress Notes (Signed)
Preliminary notes--Bilateral lower extremities venous duplex exam completed. Negative for DVT. Incidental finding:  A complex heterogenous structure seen at popliteal fossa medially, measuring 1.93x2.15x0.63cm.  Meredith Phillips (RDMS RVT) 03/26/18 9:43 AM

## 2018-03-26 NOTE — Progress Notes (Signed)
PROGRESS NOTE    Meredith Phillips  OAC:[id number] DOB: Dec 17, 1945 DOA: 03/25/2018 PCP: Evaristo Bury, NP  Brief Narrative:  72yo with PMH relevant for HLD, HTN, T2DM on orals, neuropathy, depression/anxiety coming in with atypical CP and found to have mildly elevated d-dimer.    Assessment & Plan:   Active Problems:   Benign essential HTN   Type 2 diabetes mellitus (HCC)   Atypical chest pain   Hyperlipidemia   Hyponatremia   #) Atypical CP: Pt reports severe constant pain in her r shoulder that was non-exertional and worsened with arm movement. She does not have any exercise induced CP. - pending cards eval - echo reassuring other than some elevated RVSP and g1 DD - troponins negative  #) Elevated d-dimer:  - CTA chest negative - b/l LE US shows no DVT but complex cystic lesion, pending radiology read  #) HTN/HLD: - cont aspirin 81mg  QD - cont amlodipine 10mg  Qd - cont clonidine 0.1mg  BID  - cont simvastatin 20mg  Qd - cont losartan 100mg  QD - cont carvedilol 3.125mg  BID  #) T2DM: - hold empagliflozin 25mg  QD - SSI, ACHS  #) Pain/psych: - cont gabapentin 300mg  TID  FEN: - fluids: tolerating PO - electrolytes: monitor and supp - nutrition: carb restricted diet  Prophy: enox  Dispo: pending cards eval  FULL CODE  Consultants:   Cardiology  Procedures:  Echo 03/26/18: - Left ventricle: The cavity size was normal. Wall thickness was   increased in a pattern of mild LVH. There was moderate focal   basal hypertrophy of the septum. Systolic function was normal.   The estimated ejection fraction was in the range of 60% to 65%.   Wall motion was normal; there were no regional wall motion   abnormalities. Doppler parameters are consistent with abnormal   left ventricular relaxation (grade 1 diastolic dysfunction). The   E/e&' ratio is >15, suggesting elevated LV filling pressure. - Aortic valve: Trileaflet. Sclerosis without stenosis. - Aorta:  Calcified aortic root and annulus. Ascending aortic   diameter: 41 mm (S). - Ascending aorta: The ascending aorta was mildly dilated. - Mitral valve: Calcification of the AMVL. Trace to mild   regurgitation. - Left atrium: The atrium was normal in size. - Tricuspid valve: There was trivial regurgitation. - Pulmonary arteries: PA peak pressure: 42 mm Hg (S). - Inferior vena cava: The vessel was dilated. The respirophasic   diameter changes were blunted (< 50%), consistent with elevated   central venous pressure.  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    Antimicrobials:   none    Subjective: Pt reports feeling much better. She denies any CP, N/V, diarrhea, cough, congestion, rhinorrhea.   Objective: Vitals:   03/26/18 0157 03/26/18 0400 03/26/18 0744 03/26/18 1353  BP: (!) 171/83 (!) 143/75 126/86 (!) 148/81  Pulse: 83 73 77 72  Resp: 16 16 18 18   Temp: 99 F (37.2 C) 99 F (37.2 C) 98.3 F (36.8 C) 98.6 F (37 C)  TempSrc: Oral Oral Oral Oral  SpO2: 97% 96% 97% 95%  Weight: 74.4 kg     Height:        Intake/Output Summary (Last 24 hours) at  03/26/2018 1524 Last data filed at 03/26/2018 1202 Gross per 24 hour  Intake 167.31 ml  Output -  Net 167.31 ml   Filed Weights   03/25/18 2021 03/26/18 0157  Weight: 73 kg 74.4 kg    Examination:  General exam: Appears calm and comfortable  Respiratory system: Clear to auscultation. Respiratory effort normal. Cardiovascular system: RRR, nl S1/S2, no murmurs Gastrointestinal system: Abdomen is nondistended, soft and nontender. No organomegaly or masses felt. Normal bowel sounds heard. Central nervous system: Alert and  oriented. No focal neurological deficits. Extremities: trace LE edema. Skin: No rashes Psychiatry: Judgement and insight appear normal. Mood & affect appropriate.     Data Reviewed: CBC: Recent Labs  Lab 03/25/18 2102 03/26/18 0420  WBC 9.3 8.3  NEUTROABS 6.1 4.6  HGB 12.5 11.9*  HCT 38.8 36.9  MCV 87.2 85.4  PLT 292 311   Basic Metabolic Panel: Recent Labs  Lab 03/25/18 2102 03/26/18 0420  NA 133* 138  K 3.7 3.4*  CL 98 101  CO2 25 27  GLUCOSE 170* 132*  BUN 13 11  CREATININE 1.01* 0.98  CALCIUM 9.5 9.3   GFR: Estimated Creatinine Clearance: 48.7 mL/min (by C-G formula based on SCr of 0.98 mg/dL). Liver Function Tests: Recent Labs  Lab 03/25/18 2102  AST 16  ALT 12  ALKPHOS 68  BILITOT 0.9  PROT 7.3  ALBUMIN 3.4*   No results for input(s): LIPASE, AMYLASE in the last 168 hours. No results for input(s): AMMONIA in the last 168 hours. Coagulation Profile: No results for input(s): INR, PROTIME in the last 168 hours. Cardiac Enzymes: Recent Labs  Lab 03/25/18 2102 03/26/18 0013  TROPONINI <0.03 <0.03   BNP (last 3 results) No results for input(s): PROBNP in the last 8760 hours. HbA1C: No results for input(s): HGBA1C in the last 72 hours. CBG: Recent Labs  Lab 03/26/18 0625 03/26/18 1142  GLUCAP 116* 107*   Lipid Profile: No results for input(s): CHOL, HDL, LDLCALC, TRIG, CHOLHDL, LDLDIRECT in the last 72 hours. Thyroid Function Tests: No results for input(s): TSH, T4TOTAL, FREET4, T3FREE, THYROIDAB in the last 72 hours. Anemia Panel: No results for input(s): VITAMINB12, FOLATE, FERRITIN, TIBC, IRON, RETICCTPCT in the last 72 hours. Sepsis Labs: No results for input(s): PROCALCITON, LATICACIDVEN in the last 168 hours.  No results found for this or any previous visit (from the past 240 hour(s)).       Radiology Studies: Dg Chest 2 View  Result Date: 03/25/2018 CLINICAL DATA:  Severe mid sternal shooting chest pain. Bilateral arm and  wrist pain. Symptoms for 3 days. EXAM: CHEST - 2 VIEW COMPARISON:  05/18/2017 FINDINGS: Cardiac silhouette is normal in size. No mediastinal or hilar masses. No evidence of adenopathy. There are prominent lung markings most evident in the lower lungs. Lungs otherwise clear. No pleural effusion or pneumothorax. Skeletal structures are demineralized but intact. IMPRESSION: No acute cardiopulmonary disease. Electronically Signed   By: Amie Portland M.D.   On: 03/25/2018 21:08   Ct Angio Chest Pe W Or Wo Contrast  Result Date: 03/26/2018 CLINICAL DATA:  Shortness of breath and chest pain EXAM: CT ANGIOGRAPHY CHEST WITH CONTRAST TECHNIQUE: Multidetector CT imaging of the chest was performed using the standard protocol during bolus administration of intravenous contrast. Multiplanar CT image reconstructions and MIPs were obtained to evaluate the vascular anatomy. CONTRAST:  ISOVUE-370 IOPAMIDOL (ISOVUE-370) INJECTION 76% COMPARISON:  Chest radiograph March 25, 2018 FINDINGS: Cardiovascular: There is no demonstrable pulmonary embolus. There is no  thoracic aortic aneurysm or dissection. There are scattered foci of calcification in the proximal great vessels. Visualized great vessels otherwise appear unremarkable. There are foci of aortic atherosclerosis. There are scattered foci of coronary artery calcification. There is no pericardial effusion or pericardial thickening. There is left ventricular hypertrophy. Mediastinum/Nodes: Thyroid appears unremarkable. There is no appreciable thoracic adenopathy. There is a moderate hiatal hernia. Lungs/Pleura: There are areas of patchy atelectasis bilaterally. There is no edema or consolidation. No pleural effusion or pleural thickening evident. Upper Abdomen: In the visualized upper abdomen, there is aortic atherosclerosis. Visualized upper abdominal structures otherwise appear unremarkable. Musculoskeletal: There is thoracic dextroscoliosis. There are no appreciable  blastic or lytic bone lesions. No chest wall lesions evident. Review of the MIP images confirms the above findings. IMPRESSION: 1. No demonstrable pulmonary embolus. No thoracic aortic aneurysm or dissection. There is aortic atherosclerosis as well as foci of calcification in proximal great vessels and foci of coronary artery calcification. There is left ventricular hypertrophy. 2.  Moderate hiatal hernia. 3. Areas of patchy atelectasis bilaterally. No lung edema or consolidation. 4.  No evident thoracic adenopathy. Aortic Atherosclerosis (ICD10-I70.0). Electronically Signed   By: Bretta Bang III M.D.   On: 03/26/2018 09:03        Scheduled Meds: . amLODipine  10 mg Oral Daily  . cloNIDine  0.1 mg Oral BID  . enoxaparin (LOVENOX) injection  40 mg Subcutaneous Q24H  . insulin aspart  0-5 Units Subcutaneous QHS  . insulin aspart  0-9 Units Subcutaneous TID WC  . losartan  100 mg Oral Daily  .  morphine injection  4 mg Intravenous Once  . pantoprazole  40 mg Oral Daily  . potassium chloride SA  20 mEq Oral Daily  . [START ON 03/27/2018] simvastatin  40 mg Oral Daily   Continuous Infusions:   LOS: 0 days    Time spent: 35    Delaine Lame, MD [facility]  If 7PM-7AM, please contact night-coverage [email or web address] Password [email or web address] 03/26/2018, 3:24 PM

## 2018-03-26 NOTE — Care Management Obs Status (Signed)
MEDICARE OBSERVATION STATUS NOTIFICATION   Patient Details  Name: Meredith Phillips MRN: [id number] Date of Birth: 1946/02/16   Medicare Observation Status Notification Given:  Yes    Darrold Span, RN 03/26/2018, 4:05 PM

## 2018-03-26 NOTE — Consult Note (Signed)
Cardiology Consultation:   Patient ID: [name] NIDPOE; [id number]; 1945-12-20   Admit date: 03/25/2018 Date of Consult: 03/26/2018  Primary Care Provider: Lance Sell, NP Primary Cardiologist: new to [place] Primary Electrophysiologist:  None   Patient Profile:   Meredith Phillips is a 72 y.o. female with a PMH of HTN, HLD, DM type 2, and GERD, who is being seen today for the evaluation of chest pain at the request of Dr. Herbert Moors.  History of Present Illness:   [name] was in her usual state of health until the evening of 03/24/18 when she began experiencing right sided chest pain while laying in bed. She described the pain as sharp, radiating to her neck, and associated with diaphoresis, SOB, and nausea. Her symptoms persisted throughout the night limiting her sleep. On 03/25/18, she states her pain progressed to the left side of her chest and felt more like a pressure sensation, prompting her to present to the ED for further evaluation.   She denies prior cardiac history and has never had an ischemic evaluation. She reports PMH of HTN, HLD, and DM type 2. Denies tobacco or ETOH use. She denies family history of CAD and reports her parents died from "natural causes".   At the time of this evaluation she reports being mostly CP free with the nitroglycerin paste but had a couple episodes of sharp pains again today. No clear exacerbation of symptoms with exertion. Prior to Wednesday, she had never experienced chest pain. She reports being fairly active at home with ADLs. No complaints of SOB, DOE, orthopnea, PND, LE edema, syncope, fevers, recent URIs, or problems with bleeding.   Hospital course: BP elevated, otherwise VSS. Labs notable for K 3.4, Cr 0.98, CBC wnl, Troponins negative x2, Ddimer ^0.87, ESR 55. EKG with sinus rhythm with TWI in inferolateral leads (new). Echo with EF 60-65%, no wall motion abnormalities, G1DD, elevated LV filling pressure, and mildly dilated  ascending aorta. CTA Chest without PE or edema; +CAD. Patient admitted to medicine. Cardiology asked to evaluate for chest pain.   Past Medical History:  Diagnosis Date  . Arthritis   . Benign essential HTN 02/18/2015  . Cellulitis 02/18/2015  . Chicken pox   . Chronic fatigue   . Dementia ([place])   . Depression    since last 04/2018 daughter passed.    . Diabetes mellitus without complication ([place])   . Diabetic peripheral neuropathy ([place])   . GERD (gastroesophageal reflux disease)   . Hypercholesteremia   . Hypertension   . Liver disease   . Polyp of colon     Past Surgical History:  Procedure Laterality Date  . APPENDECTOMY    . HERNIA REPAIR    . KNEE SURGERY    . ROTATOR CUFF REPAIR    . TONSILLECTOMY       Home Medications:  Prior to Admission medications   Medication Sig Start Date End Date Taking? Authorizing Provider  amLODipine (NORVASC) 10 MG tablet Take 1 tablet (10 mg total) by mouth daily. 02/09/18  Yes Lance Sell, NP  aspirin EC 81 MG tablet Take 81 mg by mouth daily.   Yes [provider]  carvedilol (COREG) 3.125 MG tablet Take 1 tablet (3.125 mg total) by mouth 2 (two) times daily with a meal. 06/15/17  Yes Marrian Salvage, FNP  Cholecalciferol (VITAMIN D-3) 1000 UNITS CAPS Take 1,000 Units by mouth.   Yes [provider]  cloNIDine (CATAPRES) 0.1 MG tablet PLEASE  SEE ATTACHED FOR DETAILED DIRECTIONS Patient taking differently: Take 0.1 mg by mouth as directed.  03/08/18  Yes Marrian Salvage, FNP  esomeprazole (NEXIUM) 40 MG capsule Take 1 capsule (40 mg total) by mouth daily at 12 noon. 02/25/18  Yes Biagio Borg, MD  fluticasone [facility]) 50 MCG/ACT nasal spray Place 2 sprays into both nostrils daily. Patient taking differently: Place 2 sprays into both nostrils as needed for allergies.  08/28/17  Yes Marrian Salvage, FNP  gabapentin (NEURONTIN) 300 MG capsule TAKE 1 CAPSULE BY MOUTH THREE TIMES A DAY Patient taking  differently: Take 300 mg by mouth 3 (three) times daily. TAKE 1 CAPSULE BY MOUTH THREE TIMES A DAY 02/17/18  Yes Shambley, Ashleigh N, NP  JARDIANCE 25 MG TABS tablet TAKE 1 TABLET BY MOUTH EVERY DAY 03/09/18  Yes Shambley, Ashleigh N, NP  KLOR-CON M20 20 MEQ tablet Take 20 mEq by mouth daily.  08/28/17  Yes [provider]  losartan (COZAAR) 100 MG tablet TAKE 1 TABLET BY MOUTH EVERY DAY 02/03/17  Yes Golden Circle, FNP  meclizine (ANTIVERT) 25 MG tablet Take 0.5-1 tablets (12.5-25 mg total) by mouth 2 (two) times daily as needed for dizziness. 10/10/17  Yes Ria Bush, MD  simvastatin (ZOCOR) 20 MG tablet TAKE 1 TABLET BY MOUTH EVERY DAY 03/12/18  Yes Lance Sell, NP  Blood Glucose Monitoring Suppl (ONE TOUCH ULTRA 2) w/Device KIT Use to check blood sugars twice a day Dx E11.9 09/01/17   Marrian Salvage, FNP  clobetasol cream (TEMOVATE) 6.78 % APPLY 1 APPLICATION 2 TIMES DAILY. DO NOT USE FOR MORE THAN TWO WEEKS AT ONE TIME Patient not taking: Reported on 03/26/2018 05/20/17   Lance Sell, NP  glucose blood (ONE TOUCH ULTRA TEST) test strip USE TO CHECK BLOOD SUGARS TWICE A DAY DX E11.9 11/30/17   Lance Sell, NP  Lancets (ONETOUCH ULTRASOFT) lancets USE TO HELP CHECK BLOOD SUGARS TWICE A DAY E11.9 11/30/17   Lance Sell, NP    Inpatient Medications: Scheduled Meds: . amLODipine  10 mg Oral Daily  . cloNIDine  0.1 mg Oral BID  . enoxaparin (LOVENOX) injection  40 mg Subcutaneous Q24H  . insulin aspart  0-5 Units Subcutaneous QHS  . insulin aspart  0-9 Units Subcutaneous TID WC  . losartan  100 mg Oral Daily  .  morphine injection  4 mg Intravenous Once  . pantoprazole  40 mg Oral Daily  . potassium chloride SA  20 mEq Oral Daily  . simvastatin  20 mg Oral Daily   Continuous Infusions:  PRN Meds: acetaminophen, gi cocktail, hydrALAZINE, morphine injection, nitroGLYCERIN, ondansetron (ZOFRAN) IV  Allergies:    Allergies  Allergen  Reactions  . Codeine Other (See Comments)    Woke up the next morning and did not know where she was  . Other     Doesn't like to take pain medication due to hallucinations.  [name] Shellfish Allergy     Social History:   Social History   Socioeconomic History  . Marital status: Married    Spouse name: Not on file  . Number of children: 5  . Years of education: 64  . Highest education level: Not on file  Occupational History  . Occupation: Retired  Scientific laboratory technician  . Financial resource strain: Not on file  . Food insecurity:    Worry: Not on file    Inability: Not on file  . Transportation needs:    Medical:  Not on file    Non-medical: Not on file  Tobacco Use  . Smoking status: Never Smoker  . Smokeless tobacco: Never Used  Substance and Sexual Activity  . Alcohol use: No  . Drug use: No  . Sexual activity: Not on file  Lifestyle  . Physical activity:    Days per week: Not on file    Minutes per session: Not on file  . Stress: Not on file  Relationships  . Social connections:    Talks on phone: Not on file    Gets together: Not on file    Attends religious service: Not on file    Active member of club or organization: Not on file    Attends meetings of clubs or organizations: Not on file    Relationship status: Not on file  . Intimate partner violence:    Fear of current or ex partner: Not on file    Emotionally abused: Not on file    Physically abused: Not on file    Forced sexual activity: Not on file  Other Topics Concern  . Not on file  Social History Narrative   Fun: paint, word search   Feels safe where she is living and no concern for abuse   Denies religious beliefs effecting healthcare.     Family History:    Family History  Problem Relation Age of Onset  . Healthy Mother   . Healthy Father   . Healthy Paternal Grandmother   . Healthy Paternal Grandfather      ROS:  Please see the history of present illness.   All other ROS reviewed and  negative.     Physical Exam/Data:   Vitals:   03/26/18 0157 03/26/18 0400 03/26/18 0744 03/26/18 1353  BP: (!) 171/83 (!) 143/75 126/86 (!) 148/81  Pulse: 83 73 77 72  Resp: '16 16 18 18  ' Temp: 99 F (37.2 C) 99 F (37.2 C) 98.3 F (36.8 C) 98.6 F (37 C)  TempSrc: Oral Oral Oral Oral  SpO2: 97% 96% 97% 95%  Weight: 74.4 kg     Height:        Intake/Output Summary (Last 24 hours) at 03/26/2018 1444 Last data filed at 03/26/2018 1202 Gross per 24 hour  Intake 167.31 ml  Output -  Net 167.31 ml   Filed Weights   03/25/18 2021 03/26/18 0157  Weight: 73 kg 74.4 kg   Body mass index is 29.07 kg/m.  General:  Well nourished, well developed, sitting upright in bed in no acute distress HEENT: sclera anicteric  Neck: no JVD Vascular: No carotid bruits; distal pulses 2+ bilaterally Cardiac:  normal S1, S2; RRR; no murmurs, rubs, or gallops Lungs:  clear to auscultation bilaterally, no wheezing, rhonchi or rales  Abd: NABS, soft, nontender, no hepatomegaly Ext: no edema Musculoskeletal:  No deformities, BUE and BLE strength normal and equal Skin: warm and dry  Neuro:  CNs 2-12 intact, no focal abnormalities noted Psych:  Normal affect   EKG:  The EKG was personally reviewed and demonstrates:  Sinus rhythm with new TWI in inferolateral leads, no STE/D.  Telemetry:  Telemetry was personally reviewed and demonstrates:  NSR  Relevant CV Studies: Echocardiogram 03/26/18: Study Conclusions  - Left ventricle: The cavity size was normal. Wall thickness was   increased in a pattern of mild LVH. There was moderate focal   basal hypertrophy of the septum. Systolic function was normal.   The estimated ejection fraction was in the range  of 60% to 65%.   Wall motion was normal; there were no regional wall motion   abnormalities. Doppler parameters are consistent with abnormal   left ventricular relaxation (grade 1 diastolic dysfunction). The   E/e&' ratio is >15, suggesting  elevated LV filling pressure. - Aortic valve: Trileaflet. Sclerosis without stenosis. - Aorta: Calcified aortic root and annulus. Ascending aortic   diameter: 41 mm (S). - Ascending aorta: The ascending aorta was mildly dilated. - Mitral valve: Calcification of the AMVL. Trace to mild   regurgitation. - Left atrium: The atrium was normal in size. - Tricuspid valve: There was trivial regurgitation. - Pulmonary arteries: PA peak pressure: 42 mm Hg (S). - Inferior vena cava: The vessel was dilated. The respirophasic   diameter changes were blunted (< 50%), consistent with elevated   central venous pressure.  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  Laboratory Data:  Chemistry Recent Labs  Lab 03/25/18 2102 03/26/18 0420  NA 133* 138  K 3.7 3.4*  CL 98 101  CO2 25 27  GLUCOSE 170* 132*  BUN 13 11  CREATININE 1.01* 0.98  CALCIUM 9.5 9.3  GFRNONAA 53* 55*  GFRAA >60 >60  ANIONGAP 10 10    Recent Labs  Lab 03/25/18 2102  PROT 7.3  ALBUMIN 3.4*  AST 16  ALT 12  ALKPHOS 68  BILITOT 0.9   Hematology Recent Labs  Lab 03/25/18 2102 03/26/18 0420  WBC 9.3 8.3  RBC 4.45 4.32  HGB 12.5 11.9*  HCT 38.8 36.9  MCV 87.2 85.4  MCH 28.1 27.5  MCHC 32.2 32.2  RDW 13.1 12.8  PLT 292 311   Cardiac Enzymes Recent Labs  Lab 03/25/18 2102 03/26/18 0013  TROPONINI <0.03 <0.03   No results for input(s): TROPIPOC in the last 168 hours.  BNPNo results for input(s): BNP, PROBNP in the last 168 hours.  DDimer  Recent Labs  Lab 03/26/18 0420  DDIMER 0.87*    Radiology/Studies:  Dg Chest 2 View  Result Date: 03/25/2018 CLINICAL DATA:  Severe mid sternal shooting chest pain.  Bilateral arm and wrist pain. Symptoms for 3 days. EXAM: CHEST - 2 VIEW COMPARISON:  05/18/2017 FINDINGS: Cardiac silhouette is normal in size. No mediastinal or hilar masses. No evidence of adenopathy. There are prominent lung markings most evident in the lower lungs. Lungs otherwise clear. No pleural effusion or pneumothorax. Skeletal structures are demineralized but intact. IMPRESSION: No acute cardiopulmonary disease. Electronically Signed   By: Lajean Manes M.D.   On: 03/25/2018 21:08   Ct Angio Chest Pe W Or Wo Contrast  Result Date: 03/26/2018 CLINICAL DATA:  Shortness of breath and chest pain EXAM: CT ANGIOGRAPHY CHEST WITH CONTRAST TECHNIQUE: Multidetector CT imaging of the chest was performed using the standard protocol during bolus administration of intravenous contrast. Multiplanar CT image reconstructions and MIPs were obtained to evaluate the vascular anatomy. CONTRAST:  11m ISOVUE-370 IOPAMIDOL (ISOVUE-370) INJECTION 76% COMPARISON:  Chest radiograph March 25, 2018 FINDINGS: Cardiovascular: There is no demonstrable pulmonary embolus. There is no thoracic aortic aneurysm or dissection. There are scattered foci of calcification in the proximal great vessels. Visualized great vessels otherwise appear unremarkable. There are foci of aortic atherosclerosis.  There are scattered foci of coronary artery calcification. There is no pericardial effusion or pericardial thickening. There is left ventricular hypertrophy. Mediastinum/Nodes: Thyroid appears unremarkable. There is no appreciable thoracic adenopathy. There is a moderate hiatal hernia. Lungs/Pleura: There are areas of patchy atelectasis bilaterally. There is no edema or consolidation. No pleural effusion or pleural thickening evident. Upper Abdomen: In the visualized upper abdomen, there is aortic atherosclerosis. Visualized upper abdominal structures otherwise appear unremarkable. Musculoskeletal: There is thoracic dextroscoliosis. There are  no appreciable blastic or lytic bone lesions. No chest wall lesions evident. Review of the MIP images confirms the above findings. IMPRESSION: 1. No demonstrable pulmonary embolus. No thoracic aortic aneurysm or dissection. There is aortic atherosclerosis as well as foci of calcification in proximal great vessels and foci of coronary artery calcification. There is left ventricular hypertrophy. 2.  Moderate hiatal hernia. 3. Areas of patchy atelectasis bilaterally. No lung edema or consolidation. 4.  No evident thoracic adenopathy. Aortic Atherosclerosis (ICD10-I70.0). Electronically Signed   By: Lowella Grip III M.D.   On: 03/26/2018 09:03    Assessment and Plan:   1. Chest pain: patient presented with chest pain - typical/atypical features. Initially started 03/24/18 as right sided sharp pain with associated diaphoresis, SOB, and nausea, which then progressed to left sided chest pain described as pressure which persisted throughout the day 03/25/18. Troponins negative x2. Ddimer Harlin Heys.87. ESR 55. EKG with sinus rhythm with TWI in inferolateral leads (new). Echo with EF 60-65%, no wall motion abnormalities, G1DD, elevated LV filling pressure, and mildly dilated ascending aorta. CTA Chest without PE or edema; +CAD. Risk factors for ACS include HTN, HLD, DM type 2, and evidence of CAD on CT scan. Pain mostly resolved with nitro paste but has had a couple sharp pains today.  - Would likely benefit from further ischemic evaluation. Consider coronary CTA vs stress myoview - Will start aspirin 78m daily given CAD on CT scan - Continue statin  2. Chronic diastolic CHF: noted to have elevated LV filling pressures with dilated IVC on echocardiogram today. No overt edema on CXR or CTA Chest. No volume overload complaints and she appears euvolemic on exam.  - Will restart home coreg - Continue losartan  3. HTN: BP remains above goal on home amlodipine, losartan, and clonidine - Will restart home coreg at this  time - Continue amlodipine, losartan, and clonidine  - Titrate medications as needed if BP remains elevated  3. HLD: LDL 101 02/2018; goal <70 - Will increase simvastatin to 476mdaily  4. DM type 2: A1C 6.5 02/2018; at goal of <70 - Continue jardiance at discharge - ISS per primary team  5. Ascending aortic aneurysm: noted to have a mild ascending aortic aneurysm on echo this admission  - Will need routing outpatient monitoring  - Titrate blood pressure medications to goal of <130/80   For questions or updates, please contact [facility] consult [email or web address] for contact info under Cardiology/STEMI.   Signed, KrAbigail ButtsPA-C  03/26/2018 2:44 PM [phone]

## 2018-03-26 NOTE — Progress Notes (Signed)
Patient arrived from [facility] via [facility]. Patient A&O, NSR. Patient given CHG bath, CCMD called and cardiac monitoring started. Assessment completed and VS checked. Patient oriented to room. Call light within reach. Pain 7/10 in left shoulder. Bed in lowest position and call bell within reach. Will continue to monitor. Victorino December, RN

## 2018-03-26 NOTE — Progress Notes (Signed)
Received call that patient coming to [id number] is coming via [facility] from [facility] and will be here in 10 minutes. Victorino December, RN

## 2018-03-27 ENCOUNTER — Observation Stay (HOSPITAL_BASED_OUTPATIENT_CLINIC_OR_DEPARTMENT_OTHER): Payer: PPO

## 2018-03-27 ENCOUNTER — Other Ambulatory Visit: Payer: Self-pay

## 2018-03-27 DIAGNOSIS — I10 Essential (primary) hypertension: Secondary | ICD-10-CM

## 2018-03-27 DIAGNOSIS — I2510 Atherosclerotic heart disease of native coronary artery without angina pectoris: Secondary | ICD-10-CM | POA: Diagnosis not present

## 2018-03-27 DIAGNOSIS — R079 Chest pain, unspecified: Secondary | ICD-10-CM | POA: Diagnosis not present

## 2018-03-27 DIAGNOSIS — E1142 Type 2 diabetes mellitus with diabetic polyneuropathy: Secondary | ICD-10-CM | POA: Diagnosis not present

## 2018-03-27 DIAGNOSIS — I5030 Unspecified diastolic (congestive) heart failure: Secondary | ICD-10-CM | POA: Diagnosis not present

## 2018-03-27 DIAGNOSIS — M25511 Pain in right shoulder: Secondary | ICD-10-CM | POA: Diagnosis not present

## 2018-03-27 DIAGNOSIS — E785 Hyperlipidemia, unspecified: Secondary | ICD-10-CM | POA: Diagnosis not present

## 2018-03-27 DIAGNOSIS — E871 Hypo-osmolality and hyponatremia: Secondary | ICD-10-CM | POA: Diagnosis not present

## 2018-03-27 DIAGNOSIS — R0789 Other chest pain: Secondary | ICD-10-CM | POA: Diagnosis not present

## 2018-03-27 DIAGNOSIS — I700 Atherosclerosis of aorta: Secondary | ICD-10-CM | POA: Diagnosis not present

## 2018-03-27 DIAGNOSIS — K219 Gastro-esophageal reflux disease without esophagitis: Secondary | ICD-10-CM | POA: Diagnosis not present

## 2018-03-27 DIAGNOSIS — I420 Dilated cardiomyopathy: Secondary | ICD-10-CM | POA: Diagnosis not present

## 2018-03-27 DIAGNOSIS — K449 Diaphragmatic hernia without obstruction or gangrene: Secondary | ICD-10-CM | POA: Diagnosis not present

## 2018-03-27 DIAGNOSIS — I080 Rheumatic disorders of both mitral and aortic valves: Secondary | ICD-10-CM | POA: Diagnosis not present

## 2018-03-27 LAB — BASIC METABOLIC PANEL WITH GFR
Anion gap: 6 (ref 5–15)
CO2: 26 mmol/L (ref 22–32)
GFR calc non Af Amer: 48 mL/min — ABNORMAL LOW (ref >60)
Potassium: 3.8 mmol/L (ref 3.5–5.1)

## 2018-03-27 LAB — NM MYOCAR MULTI W/SPECT W/WALL MOTION / EF
CSEPED: 5 min
CSEPEDS: 16 s
Estimated workload: 1 METS
Peak HR: 103 {beats}/min
Rest HR: 71 {beats}/min

## 2018-03-27 LAB — BASIC METABOLIC PANEL
BUN: 12 mg/dL (ref 8–23)
Calcium: 9.4 mg/dL (ref 8.9–10.3)
Chloride: 105 mmol/L (ref 98–111)
Creatinine, Ser: 1.1 mg/dL — ABNORMAL HIGH (ref 0.44–1.00)
GFR calc Af Amer: 56 mL/min — ABNORMAL LOW (ref >60)
Glucose, Bld: 128 mg/dL — ABNORMAL HIGH (ref 70–99)
Sodium: 137 mmol/L (ref 135–145)

## 2018-03-27 LAB — GLUCOSE, CAPILLARY
GLUCOSE-CAPILLARY: 109 mg/dL — AB (ref 70–99)
Glucose-Capillary: 95 mg/dL (ref 70–99)
Glucose-Capillary: 138 mg/dL — ABNORMAL HIGH (ref 70–99)

## 2018-03-27 MED ORDER — REGADENOSON 0.4 MG/5ML IV SOLN
INTRAVENOUS | Status: AC
  Filled 2018-03-27: qty 5

## 2018-03-27 MED ORDER — TECHNETIUM TC 99M TETROFOSMIN IV KIT
30.0000 | PACK | Freq: Once | INTRAVENOUS | Status: AC | PRN
  Administered 2018-03-27: 30 via INTRAVENOUS

## 2018-03-27 MED ORDER — TECHNETIUM TC 99M TETROFOSMIN IV KIT
10.0000 | PACK | Freq: Once | INTRAVENOUS | Status: AC | PRN
  Administered 2018-03-27: 10 via INTRAVENOUS

## 2018-03-27 MED ORDER — REGADENOSON 0.4 MG/5ML IV SOLN
0.4000 mg | Freq: Once | INTRAVENOUS | Status: AC
  Administered 2018-03-27: 0.4 mg via INTRAVENOUS
  Filled 2018-03-27: qty 5

## 2018-03-27 MED ORDER — NAPROXEN 500 MG PO TABS: 500.0000 mg | ORAL_TABLET | Freq: Two times a day (BID) | ORAL | 0 refills | Status: AC

## 2018-03-27 NOTE — Progress Notes (Signed)
Patient tried to get out of bed to get in wheelchair to go to [facility]. Patient began having pain in the left side of her chest 9 on a scale from 0-10. She stated that she was having pain in her left arm and tingling in her fingers. - Put patient back in bed and administered Nitroglycerin 0753. B/P 156-84 (105), hr 73, RR 23, O2 97% RA. - Administered second Nitroglycerin 0758. Patient reports pain 5 on scale from 0-10. B/P 128/80 (92), HR 74, RR 21, O2 93%. -Patient reports pain at a 4 after Second Nitroglycerin. B/P 124/72 (89), HR 76, RR18, O2 97% on 2L. - EKG shows NSR. Contacted provider to come and examine patient.

## 2018-03-27 NOTE — Discharge Instructions (Signed)
Chest Wall Pain °Chest wall pain is pain in or around the bones and muscles of your chest. Sometimes, an injury causes this pain. Sometimes, the cause may not be known. This pain may take several weeks or longer to get better. °Follow these instructions at home: °Pay attention to any changes in your symptoms. Take these actions to help with your pain: °· Rest as told by your doctor. °· Avoid activities that cause pain. Try not to use your chest, belly (abdominal), or side muscles to lift heavy things. °· If directed, apply ice to the painful area: °? Put ice in a plastic bag. °? Place a towel between your skin and the bag. °? Leave the ice on for 20 minutes, 2-3 times per day. °· Take over-the-counter and prescription medicines only as told by your doctor. °· Do not use tobacco products, including cigarettes, chewing tobacco, and e-cigarettes. If you need help quitting, ask your doctor. °· Keep all follow-up visits as told by your doctor. This is important. ° °Contact a doctor if: °· You have a fever. °· Your chest pain gets worse. °· You have new symptoms. °Get help right away if: °· You feel sick to your stomach (nauseous) or you throw up (vomit). °· You feel sweaty or light-headed. °· You have a cough with phlegm (sputum) or you cough up blood. °· You are short of breath. °This information is not intended to replace advice given to you by your health care provider. Make sure you discuss any questions you have with your health care provider. °Document Released: 11/19/2007 Document Revised: 11/08/2015 Document Reviewed: 08/28/2014 °Elsevier Interactive Patient Education © 2018 [employer] ° °

## 2018-03-27 NOTE — Progress Notes (Signed)
Patient presented for Lexiscan. Tolerated procedure well. Pending final stress imaging result.  radiology to read. 

## 2018-03-27 NOTE — Progress Notes (Signed)
Progress Note  Patient Name: Meredith Phillips THYHOO Date of Encounter: 03/27/2018  Primary Cardiologist: No primary care provider on file.  Subjective   Called to bedside by RN as patient had an episode of chest pain this morning while getting up to the chair. Relieved with SL nitro.   Inpatient Medications    Scheduled Meds: . amLODipine  10 mg Oral Daily  . aspirin EC  81 mg Oral Daily  . carvedilol  3.125 mg Oral BID WC  . cloNIDine  0.1 mg Oral BID  . enoxaparin (LOVENOX) injection  40 mg Subcutaneous Q24H  . insulin aspart  0-5 Units Subcutaneous QHS  . insulin aspart  0-9 Units Subcutaneous TID WC  . losartan  100 mg Oral Daily  .  morphine injection  4 mg Intravenous Once  . pantoprazole  40 mg Oral Daily  . potassium chloride SA  20 mEq Oral Daily  . simvastatin  40 mg Oral Daily   Continuous Infusions:  PRN Meds: acetaminophen, gi cocktail, hydrALAZINE, morphine injection, nitroGLYCERIN, ondansetron (ZOFRAN) IV   Vital Signs    Vitals:   03/26/18 2244 03/27/18 0001 03/27/18 0459 03/27/18 0614  BP: (!) 153/88 124/64 131/66   Pulse:  71 (!) 54   Resp:  '19 19 16  ' Temp:  99 F (37.2 C) 98.5 F (36.9 C)   TempSrc:  Oral Oral   SpO2:  95% 98%   Weight:      Height:        Intake/Output Summary (Last 24 hours) at 03/27/2018 1019 Last data filed at 03/26/2018 1900 Gross per 24 hour  Intake 360 ml  Output -  Net 360 ml   Filed Weights   03/25/18 2021 03/26/18 0157  Weight: 73 kg 74.4 kg    Telemetry    SR - Personally Reviewed  ECG    SR with TWI in inferior leads - Personally Reviewed  Physical Exam   General: Well developed, well nourished, female appearing in no acute distress. Head: Normocephalic, atraumatic.  Neck: Supple without bruits, JVD. Lungs:  Resp regular and unlabored, CTA. Heart: RRR, S1, S2, no murmur; no rub. Abdomen: Soft, non-tender, non-distended with normoactive bowel sounds.  Extremities: No clubbing, cyanosis, edema.  Distal pedal pulses are 2+ bilaterally. Neuro: Alert and oriented X 3. Moves all extremities spontaneously. Psych: Normal affect.  Labs    Chemistry Recent Labs  Lab 03/25/18 2102 03/26/18 0420 03/27/18 0347  NA 133* 138 137  K 3.7 3.4* 3.8  CL 98 101 105  CO2 '25 27 26  ' GLUCOSE 170* 132* 128*  BUN '13 11 12  ' CREATININE 1.01* 0.98 1.10*  CALCIUM 9.5 9.3 9.4  PROT 7.3  --   --   ALBUMIN 3.4*  --   --   AST 16  --   --   ALT 12  --   --   ALKPHOS 68  --   --   BILITOT 0.9  --   --   GFRNONAA 53* 55* 48*  GFRAA >60 >60 56*  ANIONGAP '10 10 6     ' Hematology Recent Labs  Lab 03/25/18 2102 03/26/18 0420  WBC 9.3 8.3  RBC 4.45 4.32  HGB 12.5 11.9*  HCT 38.8 36.9  MCV 87.2 85.4  MCH 28.1 27.5  MCHC 32.2 32.2  RDW 13.1 12.8  PLT 292 311    Cardiac Enzymes Recent Labs  Lab 03/25/18 2102 03/26/18 0013  TROPONINI <0.03 <0.03   No results for  input(s): TROPIPOC in the last 168 hours.   BNPNo results for input(s): BNP, PROBNP in the last 168 hours.   DDimer  Recent Labs  Lab 03/26/18 0420  DDIMER 0.87*      Radiology    Dg Chest 2 View  Result Date: 03/25/2018 CLINICAL DATA:  Severe mid sternal shooting chest pain. Bilateral arm and wrist pain. Symptoms for 3 days. EXAM: CHEST - 2 VIEW COMPARISON:  05/18/2017 FINDINGS: Cardiac silhouette is normal in size. No mediastinal or hilar masses. No evidence of adenopathy. There are prominent lung markings most evident in the lower lungs. Lungs otherwise clear. No pleural effusion or pneumothorax. Skeletal structures are demineralized but intact. IMPRESSION: No acute cardiopulmonary disease. Electronically Signed   By: Lajean Manes M.D.   On: 03/25/2018 21:08   Ct Angio Chest Pe W Or Wo Contrast  Result Date: 03/26/2018 CLINICAL DATA:  Shortness of breath and chest pain EXAM: CT ANGIOGRAPHY CHEST WITH CONTRAST TECHNIQUE: Multidetector CT imaging of the chest was performed using the standard protocol during bolus  administration of intravenous contrast. Multiplanar CT image reconstructions and MIPs were obtained to evaluate the vascular anatomy. CONTRAST:  134m ISOVUE-370 IOPAMIDOL (ISOVUE-370) INJECTION 76% COMPARISON:  Chest radiograph March 25, 2018 FINDINGS: Cardiovascular: There is no demonstrable pulmonary embolus. There is no thoracic aortic aneurysm or dissection. There are scattered foci of calcification in the proximal great vessels. Visualized great vessels otherwise appear unremarkable. There are foci of aortic atherosclerosis. There are scattered foci of coronary artery calcification. There is no pericardial effusion or pericardial thickening. There is left ventricular hypertrophy. Mediastinum/Nodes: Thyroid appears unremarkable. There is no appreciable thoracic adenopathy. There is a moderate hiatal hernia. Lungs/Pleura: There are areas of patchy atelectasis bilaterally. There is no edema or consolidation. No pleural effusion or pleural thickening evident. Upper Abdomen: In the visualized upper abdomen, there is aortic atherosclerosis. Visualized upper abdominal structures otherwise appear unremarkable. Musculoskeletal: There is thoracic dextroscoliosis. There are no appreciable blastic or lytic bone lesions. No chest wall lesions evident. Review of the MIP images confirms the above findings. IMPRESSION: 1. No demonstrable pulmonary embolus. No thoracic aortic aneurysm or dissection. There is aortic atherosclerosis as well as foci of calcification in proximal great vessels and foci of coronary artery calcification. There is left ventricular hypertrophy. 2.  Moderate hiatal hernia. 3. Areas of patchy atelectasis bilaterally. No lung edema or consolidation. 4.  No evident thoracic adenopathy. Aortic Atherosclerosis (ICD10-I70.0). Electronically Signed   By: WLowella GripIII M.D.   On: 03/26/2018 09:03    Cardiac Studies   TTE: 03/26/18  Study Conclusions  - Left ventricle: The cavity size was  normal. Wall thickness was   increased in a pattern of mild LVH. There was moderate focal   basal hypertrophy of the septum. Systolic function was normal.   The estimated ejection fraction was in the range of 60% to 65%.   Wall motion was normal; there were no regional wall motion   abnormalities. Doppler parameters are consistent with abnormal   left ventricular relaxation (grade 1 diastolic dysfunction). The   E/e&' ratio is >15, suggesting elevated LV filling pressure. - Aortic valve: Trileaflet. Sclerosis without stenosis. - Aorta: Calcified aortic root and annulus. Ascending aortic   diameter: 41 mm (S). - Ascending aorta: The ascending aorta was mildly dilated. - Mitral valve: Calcification of the AMVL. Trace to mild   regurgitation. - Left atrium: The atrium was normal in size. - Tricuspid valve: There was trivial regurgitation. - Pulmonary  arteries: PA peak pressure: 42 mm Hg (S). - Inferior vena cava: The vessel was dilated. The respirophasic   diameter changes were blunted (< 50%), consistent with elevated   central venous pressure.  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   Patient Profile     72 y.o. female with a PMH of HTN, HLD, DM type 2, and GERD, who was seen for the evaluation of chest pain.   Assessment & Plan    1. Chest pain: patient presented with chest pain - typical/atypical features. Initially started 03/24/18 as right sided sharp pain with associated diaphoresis, SOB, and nausea, which then progressed to left sided chest pain described as pressure which persisted throughout the day 03/25/18. Troponins negative x2. Ddimer Harlin Heys.87. ESR 55. Had an episode of chest  pain this morning while trying to get into wheelchair. Relieved with 2 SL nitro. She is tender with palpation to the left upper chest and neck area.  - planned for stress test today. - on ASA and statin  2. Chronic diastolic CHF: noted to have elevated LV filling pressures with dilated IVC on echocardiogram today. No overt edema on CXR or CTA Chest. No volume overload complaints and she appears euvolemic on exam.  - Continue losartan and coreg.  3. HTN: BP improved this morning.  - Continue coreg, amlodipine, losartan, and clonidine  - Titrate medications as needed if BP remains elevated  3. HLD: LDL 101 02/2018; goal <70 - simvastatin to 49m daily  4. DM type 2: A1C 6.5 02/2018; at goal of <70 - Continue jardiance at discharge - ISS per primary team  5. Ascending aortic aneurysm: noted to have a mild ascending aortic aneurysm on echo this admission  - Will need routing outpatient monitoring  - Titrate blood pressure medications to goal of <130/80  Signed, LReino Bellis NP  03/27/2018, 10:19 AM  Pager # [phone]  For questions or updates, please contact [email or web address] consult [email or web address] for contact info under Cardiology/STEMI.

## 2018-03-27 NOTE — Progress Notes (Signed)
    Informed patient of low risk stress test results. Paged primary to inform as well.   FINDINGS: Perfusion: No rest defects. No areas of reversibility to suggest inducible ischemia.  Wall Motion: Normal left ventricular wall motion. No left ventricular dilation.  Left Ventricular Ejection Fraction: 69 %  End diastolic volume 85 ml  End systolic volume 26 ml  IMPRESSION: 1. No reversible ischemia or infarction.  2. Normal left ventricular wall motion.  3. Left ventricular ejection fraction 69%  4. Non invasive risk stratification*: Low  Signed, Laverda Page, NP-C 03/27/2018, 2:10 PM Pager: [phone]

## 2018-03-27 NOTE — Progress Notes (Signed)
Patient denies any complaints post stress test.

## 2018-03-27 NOTE — Discharge Summary (Signed)
Physician Discharge Summary  Meredith Phillips EXN:[id number] DOB: [birth year] DOA: 03/25/2018  PCP: Lance Sell, NP  Admit date: 03/25/2018 Discharge date: 03/27/2018  Admitted From: Home Disposition: Home  Recommendations for Outpatient Follow-up:  1. Follow up with PCP in 1-2 weeks 2. Please obtain BMP/CBC in one week 3. Please follow-up on cystic structure noted on lower extremity ultrasound on the left leg and popliteal fossa medially.   Home Health: No Equipment/Devices: None  Discharge Condition: Stable CODE STATUS: Full Diet recommendation: Heart Healthy / Carb Modified   Brief/Interim Summary:  #) Atypical chest pain: Patient presented with initially right-sided shoulder pain that was worse with moving that lasted for several hours.  Pain was sharp.  She then reported that it transferred to her left shoulder.  Patient's troponins were negative.  EKG was relatively reassuring.  There were some T wave inversions noted.  Patient never had any exertional chest pain.  Patient was seen by cardiology who recommended stress.  Echo was unremarkable other than grade 1 diastolic dysfunction.  Stress was low risk.  Patient was discharged with diagnosis of atypical chest pain likely musculoskeletal in nature.  #) Elevated d-dimer: Patient had elevated d-dimer that was checked.  CTA was negative.  Lower extremities ultrasound showed no evidence of DVT.  Ultrasound did show complex cystic structure in the medial popliteal fossa that can be followed up as an outpatient.  #) Hypertension/hyperlipidemia: Patient was continued on home aspirin, amlodipine, clonidine, simvastatin, losartan, carvedilol.  #) Type 2 diabetes: Patient's home SGLT2 inhibitor was held.  She was maintained on sliding scale insulin and before meals at bedtime.  #) Pain/psych: Patient was continued on gabapentin.  Discharge Diagnoses:  Active Problems:   Benign essential HTN   Type 2 diabetes mellitus (HCC)    Atypical chest pain   Hyperlipidemia   Hyponatremia    Discharge Instructions  Discharge Instructions    Call MD for:  difficulty breathing, headache or visual disturbances   Complete by:  As directed    Call MD for:  hives   Complete by:  As directed    Call MD for:  persistant nausea and vomiting   Complete by:  As directed    Call MD for:  redness, tenderness, or signs of infection (pain, swelling, redness, odor or green/yellow discharge around incision site)   Complete by:  As directed    Call MD for:  severe uncontrolled pain   Complete by:  As directed    Call MD for:  temperature >100.4   Complete by:  As directed    Diet - low sodium heart healthy   Complete by:  As directed    Discharge instructions   Complete by:  As directed    Please follow-up with your primary care doctor in 1 to 2 weeks.  Please use the Naprosyn for your shoulder pain.   Increase activity slowly   Complete by:  As directed      Allergies as of 03/27/2018      Reactions   Codeine Other (See Comments)   Woke up the next morning and did not know where she was   Other    Doesn't like to take pain medication due to hallucinations.   Shellfish Allergy       Medication List    STOP taking these medications   clobetasol cream 0.05 % Commonly known as:  TEMOVATE     TAKE these medications   amLODipine 10 MG tablet Commonly known  as:  NORVASC Take 1 tablet (10 mg total) by mouth daily.   aspirin EC 81 MG tablet Take 81 mg by mouth daily.   carvedilol 3.125 MG tablet Commonly known as:  COREG Take 1 tablet (3.125 mg total) by mouth 2 (two) times daily with a meal.   cloNIDine 0.1 MG tablet Commonly known as:  CATAPRES PLEASE SEE ATTACHED FOR DETAILED DIRECTIONS What changed:  See the new instructions.   esomeprazole 40 MG capsule Commonly known as:  NEXIUM Take 1 capsule (40 mg total) by mouth daily at 12 noon.   fluticasone 50 MCG/ACT nasal spray Commonly known as:   FLONASE Place 2 sprays into both nostrils daily. What changed:    when to take this  reasons to take this   gabapentin 300 MG capsule Commonly known as:  NEURONTIN TAKE 1 CAPSULE BY MOUTH THREE TIMES A DAY What changed:  See the new instructions.   glucose blood test strip USE TO CHECK BLOOD SUGARS TWICE A DAY DX E11.9   JARDIANCE 25 MG Tabs tablet Generic drug:  empagliflozin TAKE 1 TABLET BY MOUTH EVERY DAY   KLOR-CON M20 20 MEQ tablet Generic drug:  potassium chloride SA Take 20 mEq by mouth daily.   losartan 100 MG tablet Commonly known as:  COZAAR TAKE 1 TABLET BY MOUTH EVERY DAY   meclizine 25 MG tablet Commonly known as:  ANTIVERT Take 0.5-1 tablets (12.5-25 mg total) by mouth 2 (two) times daily as needed for dizziness.   naproxen 500 MG tablet Commonly known as:  NAPROSYN Take 1 tablet (500 mg total) by mouth 2 (two) times daily with a meal for 7 days.   ONE TOUCH ULTRA 2 w/Device Kit Use to check blood sugars twice a day Dx E11.9   onetouch ultrasoft lancets USE TO HELP CHECK BLOOD SUGARS TWICE A DAY E11.9   simvastatin 20 MG tablet Commonly known as:  ZOCOR TAKE 1 TABLET BY MOUTH EVERY DAY   Vitamin D-3 1000 units Caps Take 1,000 Units by mouth.      Follow-up Information    Schedule an appointment as soon as possible for a visit  with Lance Sell, NP.   Specialty:  Nurse Practitioner Contact information: [address] [place] [address] [phone]          Allergies  Allergen Reactions  . Codeine Other (See Comments)    Woke up the next morning and did not know where she was  . Other     Doesn't like to take pain medication due to hallucinations.  Dia Sitter Allergy     Consultations:   Cardiology   Procedures/Studies: Dg Chest 2 View  Result Date: 03/25/2018 CLINICAL DATA:  Severe mid sternal shooting chest pain. Bilateral arm and wrist pain. Symptoms for 3 days. EXAM: CHEST - 2 VIEW COMPARISON:  05/18/2017  FINDINGS: Cardiac silhouette is normal in size. No mediastinal or hilar masses. No evidence of adenopathy. There are prominent lung markings most evident in the lower lungs. Lungs otherwise clear. No pleural effusion or pneumothorax. Skeletal structures are demineralized but intact. IMPRESSION: No acute cardiopulmonary disease. Electronically Signed   By: Lajean Manes M.D.   On: 03/25/2018 21:08   Ct Angio Chest Pe W Or Wo Contrast  Result Date: 03/26/2018 CLINICAL DATA:  Shortness of breath and chest pain EXAM: CT ANGIOGRAPHY CHEST WITH CONTRAST TECHNIQUE: Multidetector CT imaging of the chest was performed using the standard protocol during bolus administration of intravenous contrast. Multiplanar  CT image reconstructions and MIPs were obtained to evaluate the vascular anatomy. CONTRAST:  170m ISOVUE-370 IOPAMIDOL (ISOVUE-370) INJECTION 76% COMPARISON:  Chest radiograph March 25, 2018 FINDINGS: Cardiovascular: There is no demonstrable pulmonary embolus. There is no thoracic aortic aneurysm or dissection. There are scattered foci of calcification in the proximal great vessels. Visualized great vessels otherwise appear unremarkable. There are foci of aortic atherosclerosis. There are scattered foci of coronary artery calcification. There is no pericardial effusion or pericardial thickening. There is left ventricular hypertrophy. Mediastinum/Nodes: Thyroid appears unremarkable. There is no appreciable thoracic adenopathy. There is a moderate hiatal hernia. Lungs/Pleura: There are areas of patchy atelectasis bilaterally. There is no edema or consolidation. No pleural effusion or pleural thickening evident. Upper Abdomen: In the visualized upper abdomen, there is aortic atherosclerosis. Visualized upper abdominal structures otherwise appear unremarkable. Musculoskeletal: There is thoracic dextroscoliosis. There are no appreciable blastic or lytic bone lesions. No chest wall lesions evident. Review of the MIP  images confirms the above findings. IMPRESSION: 1. No demonstrable pulmonary embolus. No thoracic aortic aneurysm or dissection. There is aortic atherosclerosis as well as foci of calcification in proximal great vessels and foci of coronary artery calcification. There is left ventricular hypertrophy. 2.  Moderate hiatal hernia. 3. Areas of patchy atelectasis bilaterally. No lung edema or consolidation. 4.  No evident thoracic adenopathy. Aortic Atherosclerosis (ICD10-I70.0). Electronically Signed   By: WLowella GripIII M.D.   On: 03/26/2018 09:03   Nm Myocar Multi W/spect W/wall Motion / Ef  Result Date: 03/27/2018 CLINICAL DATA:  Chest pain. Diabetes. Hypertension. Elevated cholesterol. EXAM: MYOCARDIAL IMAGING WITH SPECT (REST AND PHARMACOLOGIC-STRESS) GATED LEFT VENTRICULAR WALL MOTION STUDY LEFT VENTRICULAR EJECTION FRACTION TECHNIQUE: Standard myocardial SPECT imaging was performed after resting intravenous injection of 10 mCi Tc-930metrofosmin. Subsequently, intravenous infusion of Lexiscan was performed under the supervision of the Cardiology staff. At peak effect of the drug, 30 mCi Tc-9926mtrofosmin was injected intravenously and standard myocardial SPECT imaging was performed. Quantitative gated imaging was also performed to evaluate left ventricular wall motion, and estimate left ventricular ejection fraction. COMPARISON:  CT of 03/26/2018 FINDINGS: Perfusion: No rest defects. No areas of reversibility to suggest inducible ischemia. Wall Motion: Normal left ventricular wall motion. No left ventricular dilation. Left Ventricular Ejection Fraction: 69 % End diastolic volume 85 ml End systolic volume 26 ml IMPRESSION: 1. No reversible ischemia or infarction. 2. Normal left ventricular wall motion. 3. Left ventricular ejection fraction 69% 4. Non invasive risk stratification*: Low *2012 Appropriate Use Criteria for Coronary Revascularization Focused Update: J Am Coll Cardiol. 2018588;50(2):[email or web address] Electronically Signed   By: KylAbigail MiyamotoD.   On: 03/27/2018 13:48    Echo 03/26/18: - Left ventricle: The cavity size was normal. Wall thickness was increased in a pattern of mild LVH. There was moderate focal basal hypertrophy of the septum. Systolic function was normal. The estimated ejection fraction was in the range of 60% to 65%. Wall motion was normal; there were no regional wall motion abnormalities. Doppler parameters are consistent with abnormal left ventricular relaxation (grade 1 diastolic dysfunction). The E/e&' ratio is >15, suggesting elevated LV filling pressure. - Aortic valve: Trileaflet. Sclerosis without stenosis. - Aorta: Calcified aortic root and annulus. Ascending aortic diameter: 41 mm (S). - Ascending aorta: The ascending aorta was mildly dilated. - Mitral valve: Calcification of the AMVL. Trace to mild regurgitation. - Left atrium: The atrium was normal in size. - Tricuspid valve: There was trivial regurgitation. - Pulmonary arteries: PA peak  pressure: 42 mm Hg (S). - Inferior vena cava: The vessel was dilated. The respirophasic diameter changes were blunted (<50%), consistent with elevated central venous pressure.  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   03/26/2018 DVT ultrasound lower extremity bilateral:Preliminary notes--Bilateral lower extremities venous duplex exam completed. Negative for DVT. Incidental finding:  A complex heterogenous structure seen at popliteal fossa medially, measuring 1.93x2.15x0.63cm.   Subjective:   Discharge Exam: Vitals:   03/27/18 1049  03/27/18 1205  BP: (!) 156/75 (!) 152/81  Pulse:  74  Resp:  18  Temp:    SpO2:  98%   Vitals:   03/27/18 1046 03/27/18 1047 03/27/18 1049 03/27/18 1205  BP: (!) 159/74 (!) 159/71 (!) 156/75 (!) 152/81  Pulse:    74  Resp:    18  Temp:      TempSrc:      SpO2:    98%  Weight:      Height:       General exam: Appears calm and comfortable  Respiratory system: Clear to auscultation. Respiratory effort normal. Cardiovascular system: RRR, nl S1/S2, no murmurs Gastrointestinal system: Abdomen is nondistended, soft and nontender. No organomegaly or masses felt. Normal bowel sounds heard. Central nervous system: Alert and oriented. No focal neurological deficits. Extremities: trace LE edema. Skin: No rashes Psychiatry: Judgement and insight appear normal. Mood & affect appropriate.     The results of significant diagnostics from this hospitalization (including imaging, microbiology, ancillary and laboratory) are listed below for reference.     Microbiology: No results found for this or any previous visit (from the past 240 hour(s)).   Labs: BNP (last 3 results) No results for input(s): BNP in the last 8760 hours. Basic Metabolic Panel: Recent Labs  Lab 03/25/18 2102 03/26/18 0420 03/27/18 0347  NA 133* 138 137  K 3.7 3.4* 3.8  CL 98 101 105  CO2 '25 27 26  ' GLUCOSE 170* 132* 128*  BUN '13 11 12  ' CREATININE 1.01* 0.98 1.10*  CALCIUM 9.5 9.3 9.4   Liver Function Tests: Recent Labs  Lab 03/25/18 2102  AST 16  ALT 12  ALKPHOS 68  BILITOT 0.9  PROT 7.3  ALBUMIN 3.4*   No results for input(s): LIPASE, AMYLASE in the last 168 hours. No results for input(s): AMMONIA in the last 168 hours. CBC: Recent Labs  Lab 03/25/18 2102 03/26/18 0420  WBC 9.3 8.3  NEUTROABS 6.1 4.6  HGB 12.5 11.9*  HCT 38.8 36.9  MCV 87.2 85.4  PLT 292 311   Cardiac Enzymes: Recent Labs  Lab 03/25/18 2102 03/26/18 0013  TROPONINI <0.03 <0.03   BNP: Invalid input(s):  POCBNP CBG: Recent Labs  Lab 03/26/18 1142 03/26/18 1644 03/26/18 2104 03/27/18 0604 03/27/18 1201  GLUCAP 107* 157* 121* 95 138*   D-Dimer Recent Labs    03/26/18 0420  DDIMER 0.87*   Hgb A1c No results for input(s): HGBA1C in the last 72 hours. Lipid Profile No results for input(s): CHOL, HDL, LDLCALC, TRIG, CHOLHDL, LDLDIRECT in the last 72 hours. Thyroid function studies No results for input(s): TSH, T4TOTAL, T3FREE, THYROIDAB in the last 72 hours.  Invalid input(s): FREET3 Anemia work up No results for input(s): VITAMINB12, FOLATE, FERRITIN, TIBC, IRON, RETICCTPCT in the  last 72 hours. Urinalysis    Component Value Date/Time   COLORURINE YELLOW 02/25/2018 [place] 02/25/2018 1032   LABSPEC 1.015 02/25/2018 1032   PHURINE 6.0 02/25/2018 1032   GLUCOSEU >=1000 (A) 02/25/2018 1032   HGBUR NEGATIVE 02/25/2018 1032   BILIRUBINUR NEGATIVE 02/25/2018 1032   KETONESUR NEGATIVE 02/25/2018 1032   PROTEINUR >300 (A) 05/18/2017 1156   UROBILINOGEN 0.2 02/25/2018 1032   NITRITE NEGATIVE 02/25/2018 1032   LEUKOCYTESUR NEGATIVE 02/25/2018 1032   Sepsis Labs Invalid input(s): PROCALCITONIN,  WBC,  LACTICIDVEN Microbiology No results found for this or any previous visit (from the past 240 hour(s)).   Time coordinating discharge: Over 30 minutes  SIGNED:   Cristy Folks, MD  [facility] 03/27/2018, 2:48 PM   If 7PM-7AM, please contact night-coverage [email or web address] Password [email or web address]

## 2018-03-29 ENCOUNTER — Telehealth: Payer: Self-pay | Admitting: *Deleted

## 2018-03-29 NOTE — Telephone Encounter (Signed)
Transition Care Management Follow-up Telephone Call   Date discharged? 03/27/18   How have you been since you were released from the hospital? Pt states she is ok just a little tired   Do you understand why you were in the hospital? YES   Do you understand the discharge instructions? YES   Where were you discharged to? Home   Items Reviewed:  Medications reviewed: YES  Allergies reviewed: YES  Dietary changes reviewed: YES, heart healthy  Referrals reviewed: No referral needed   Functional Questionnaire:   Activities of Daily Living (ADLs):   She states she are independent in the following: ambulation, bathing and hygiene, feeding, continence, grooming, toileting and dressing States she doesn't require assistance..she states I just takes her time   Any transportation issues/concerns?: NO   Any patient concerns? NO   Confirmed importance and date/time of follow-up visits scheduled YES, appt 04/02/18  Provider Appointment booked with Alphonse Guild, NP  Confirmed with patient if condition begins to worsen call PCP or go to the ER.  Patient was given the office number and encouraged to call back with question or concerns.  : YES

## 2018-04-02 ENCOUNTER — Ambulatory Visit (INDEPENDENT_AMBULATORY_CARE_PROVIDER_SITE_OTHER)
Admission: RE | Admit: 2018-04-02 | Discharge: 2018-04-02 | Disposition: A | Discharge status: Home / Self Care | Payer: PPO | Source: Ambulatory Visit | Attending: Nurse Practitioner | Admitting: Nurse Practitioner

## 2018-04-02 ENCOUNTER — Ambulatory Visit (INDEPENDENT_AMBULATORY_CARE_PROVIDER_SITE_OTHER): Payer: PPO | Admitting: Nurse Practitioner

## 2018-04-02 ENCOUNTER — Other Ambulatory Visit (INDEPENDENT_AMBULATORY_CARE_PROVIDER_SITE_OTHER): Payer: PPO

## 2018-04-02 ENCOUNTER — Encounter: Payer: Self-pay | Admitting: Nurse Practitioner

## 2018-04-02 VITALS — BP 148/82 | HR 90 | Temp 98.9°F | Wt 164.8 lb

## 2018-04-02 DIAGNOSIS — K2100 Gastro-esophageal reflux disease with esophagitis, without bleeding: Secondary | ICD-10-CM

## 2018-04-02 DIAGNOSIS — I10 Essential (primary) hypertension: Secondary | ICD-10-CM

## 2018-04-02 DIAGNOSIS — R0789 Other chest pain: Secondary | ICD-10-CM | POA: Diagnosis not present

## 2018-04-02 DIAGNOSIS — M1712 Unilateral primary osteoarthritis, left knee: Secondary | ICD-10-CM | POA: Diagnosis not present

## 2018-04-02 DIAGNOSIS — E119 Type 2 diabetes mellitus without complications: Secondary | ICD-10-CM

## 2018-04-02 DIAGNOSIS — R5382 Chronic fatigue, unspecified: Secondary | ICD-10-CM | POA: Diagnosis not present

## 2018-04-02 DIAGNOSIS — R936 Abnormal findings on diagnostic imaging of limbs: Secondary | ICD-10-CM

## 2018-04-02 DIAGNOSIS — E785 Hyperlipidemia, unspecified: Secondary | ICD-10-CM

## 2018-04-02 LAB — BASIC METABOLIC PANEL
BUN: 8 mg/dL (ref 6–23)
CHLORIDE: 105 meq/L (ref 96–112)
CO2: 30 meq/L (ref 19–32)
Calcium: 10.2 mg/dL (ref 8.4–10.5)
Creatinine, Ser: 0.91 mg/dL (ref 0.40–1.20)
GFR: 77.55 mL/min (ref >60.00)
GLUCOSE: 183 mg/dL — AB (ref 70–99)
POTASSIUM: 3.4 meq/L — AB (ref 3.5–5.1)
Sodium: 142 mEq/L (ref 135–145)

## 2018-04-02 LAB — CBC
HEMATOCRIT: 38.1 % (ref 36.0–46.0)
HEMOGLOBIN: 12.4 g/dL (ref 12.0–15.0)
MCHC: 32.6 g/dL (ref 30.0–36.0)
MCV: 85.4 fl (ref 78.0–100.0)
Platelets: 499 10*3/uL — ABNORMAL HIGH (ref 150.0–400.0)
RBC: 4.47 Mil/uL (ref 3.87–5.11)
RDW: 14.3 % (ref 11.5–15.5)
WBC: 7.7 10*3/uL (ref 4.0–10.5)

## 2018-04-02 LAB — URINALYSIS, ROUTINE W REFLEX MICROSCOPIC
Bilirubin Urine: NEGATIVE
HGB URINE DIPSTICK: NEGATIVE
Ketones, ur: NEGATIVE
Nitrite: NEGATIVE
SPECIFIC GRAVITY, URINE: 1.025 (ref 1.000–1.030)
Total Protein, Urine: 100 — AB
Urine Glucose: NEGATIVE
Urobilinogen, UA: 1 (ref 0.0–1.0)
pH: 7 (ref 5.0–8.0)

## 2018-04-02 LAB — VITAMIN B12: VITAMIN B 12: 338 pg/mL (ref 211–911)

## 2018-04-02 LAB — VITAMIN D 25 HYDROXY (VIT D DEFICIENCY, FRACTURES): VITD: 33.35 ng/mL (ref 30.00–100.00)

## 2018-04-02 NOTE — Patient Instructions (Addendum)
Head downstairs for lab work and a knee xray.  Please continue clonidine 0.1 at bedtime for your blood pressure  Please resume jardiance 25 mg daily  Please schedule a follow up appointment for further evaluation of your leg here with Dr Katrinka Blazing or Dr Jordan Likes, our sports medicine providers

## 2018-04-02 NOTE — Assessment & Plan Note (Signed)
Stable Continue simvastatin Labs are up to date - CBC; Future - Basic metabolic panel; Future

## 2018-04-02 NOTE — Assessment & Plan Note (Signed)
Will continue clonidine only at her preference  She plans to monitor BP at home RTC in 1 month for F/U, sooner for readings >150/90 - CBC; Future - Basic metabolic panel; Future

## 2018-04-02 NOTE — Assessment & Plan Note (Signed)
Stable Continue nexium F/u for new, worsening symptoms

## 2018-04-02 NOTE — Assessment & Plan Note (Signed)
Resume jardiance  A1c up to date Lab Results  Component Value Date   HGBA1C 6.5 03/10/2018  - CBC; Future - Basic metabolic panel; Future

## 2018-04-02 NOTE — Progress Notes (Signed)
Meredith Phillips is a 72 y.o. female with the following history as recorded in Epic[employer]:  Patient Active Problem List   Diagnosis Date Noted  . Hyperlipidemia 03/26/2018  . Hyponatremia 03/26/2018  . Atypical chest pain 03/25/2018  . Urge incontinence 02/25/2018  . Glucosuria 02/25/2018  . Anxiety and depression 12/16/2017  . Memory loss 11/02/2017  . B12 deficiency 11/02/2017  . Vertigo 10/10/2017  . Trochanteric bursitis of left hip 10/10/2017  . Murmur, cardiac 04/15/2017  . Asymptomatic postmenopausal estrogen deficiency 04/15/2017  . Right-sided low back pain without sciatica 02/10/2017  . Grief at loss of child 07/29/2016  . Allergic rhinitis 02/27/2016  . GERD (gastroesophageal reflux disease) 08/06/2015  . Baker's cyst, ruptured 08/03/2015  . Type 2 diabetes mellitus ([place]) 06/29/2015  . Arthritis of left knee 06/15/2015  . Benign essential HTN 02/18/2015  . Cellulitis 02/18/2015  . Sciatica 01/17/2015  . Routine general medical examination at a health care facility 12/26/2014  . Medicare annual wellness visit, subsequent 12/26/2014    Current Outpatient Medications  Medication Sig Dispense Refill  . aspirin EC 81 MG tablet Take 81 mg by mouth daily.    . Blood Glucose Monitoring Suppl (ONE TOUCH ULTRA 2) w/Device KIT Use to check blood sugars twice a day Dx E11.9 1 each 0  . Cholecalciferol (VITAMIN D-3) 1000 UNITS CAPS Take 1,000 Units by mouth.    . cloNIDine (CATAPRES) 0.1 MG tablet PLEASE SEE ATTACHED FOR DETAILED DIRECTIONS (Patient taking differently: Take 0.1 mg by mouth as directed. ) 60 tablet 1  . esomeprazole (NEXIUM) 40 MG capsule Take 1 capsule (40 mg total) by mouth daily at 12 noon. 30 capsule 2  . fluticasone (FLONASE) 50 MCG/ACT nasal spray Place 2 sprays into both nostrils daily. (Patient taking differently: Place 2 sprays into both nostrils as needed for allergies. ) 16 g 6  . gabapentin (NEURONTIN) 300 MG capsule TAKE 1 CAPSULE BY MOUTH THREE TIMES A  DAY (Patient taking differently: Take 300 mg by mouth 3 (three) times daily. TAKE 1 CAPSULE BY MOUTH THREE TIMES A DAY) 270 capsule 0  . glucose blood (ONE TOUCH ULTRA TEST) test strip USE TO CHECK BLOOD SUGARS TWICE A DAY DX E11.9 100 each 1  . JARDIANCE 25 MG TABS tablet TAKE 1 TABLET BY MOUTH EVERY DAY 30 tablet 1  . Lancets (ONETOUCH ULTRASOFT) lancets USE TO HELP CHECK BLOOD SUGARS TWICE A DAY E11.9 100 each 1  . meclizine (ANTIVERT) 25 MG tablet Take 0.5-1 tablets (12.5-25 mg total) by mouth 2 (two) times daily as needed for dizziness. 30 tablet 0  . simvastatin (ZOCOR) 20 MG tablet TAKE 1 TABLET BY MOUTH EVERY DAY 30 tablet 3  . naproxen (NAPROSYN) 500 MG tablet Take 1 tablet (500 mg total) by mouth 2 (two) times daily with a meal for 7 days. (Patient not taking: Reported on 04/02/2018) 14 tablet 0   No current facility-administered medications for this visit.     Allergies: Codeine; Other; and Shellfish allergy  Past Medical History:  Diagnosis Date  . Arthritis   . Benign essential HTN 02/18/2015  . Cellulitis 02/18/2015  . Chicken pox   . Chronic fatigue   . Dementia ([place])   . Depression    since last 04/2018 daughter passed.    . Diabetes mellitus without complication ([place])   . Diabetic peripheral neuropathy ([place])   . GERD (gastroesophageal reflux disease)   . Hypercholesteremia   . Hypertension   . Liver disease   .  Polyp of colon     Past Surgical History:  Procedure Laterality Date  . APPENDECTOMY    . HERNIA REPAIR    . KNEE SURGERY    . ROTATOR CUFF REPAIR    . TONSILLECTOMY      Family History  Problem Relation Age of Onset  . Healthy Mother   . Healthy Father   . Healthy Paternal Grandmother   . Healthy Paternal Grandfather     Social History   Tobacco Use  . Smoking status: Never Smoker  . Smokeless tobacco: Never Used  Substance Use Topics  . Alcohol use: No     Subjective:  Ms Shaheen is here today for hospital follow up, accompanied by her  daughters. She went to the [facility] on [facility]/25/18 for complaint of chest pain and shortness of breath x 2 days. While in the ED, EKG showed new T waves, and her chest pain continued so she was admitted to a step down bed for observation. During her hospital stay, serial troponins were negative, echo was unremarkable other than grade 1 diastolic dysfunction, stress test was low risk. She did have an elevated d-dimer, CTA for follow up was negative, lower extremity doppler did not show DVT but did show a "complex cystic structure" to left popliteal fossa It was decided that her CP was likely musculoskeletal in origin, she was discharged home on 03/27/18 with the following  Recommendations for Outpatient Follow-up:  1. Follow up with PCP in 1-2 weeks 2. Please obtain BMP/CBC in one week 3. Please follow-up on cystic structure noted on lower extremity ultrasound on the left leg and popliteal fossa medially. Since home, she is feeling well overall, no more SOB, occassionally has noticed a mild pulling pain in her chest with certain movements, has been applying heat which helps, seems to be getting better. She is a little unsure of what medicine she is supposed to be taking for her diabetes and blood pressure since there were some changes in the hospital. She has not re-started her jardiance and is actually only taking clonidine 0.77m at bedtime for her blood pressure, does not want to resume her amlodipine, losartan, or carvedilol because she does not like taking these medications, says they make her "feel dizzy." she is still taking daily 81 asa, nexium 40 q am, gabapentin 300 TID, simvastatin 20 daily. No fevers, syncope, weakness, leg pain, edema, heartburn, nausea.  ROS- SEE HPI   Objective:  Vitals:   04/02/18 1309  BP: (!) 148/82  Pulse: 90  Temp: 98.9 F (37.2 C)  TempSrc: Oral  Weight: 164 lb 12.8 oz (74.8 kg)    General: Well developed, well nourished, in no acute distress  Skin : Warm and  dry.  Head: Normocephalic and atraumatic  Eyes: Sclera and conjunctiva clear; pupils round and reactive to light; extraocular movements intact  Oropharynx: Pink, supple.  Neck: Supple   Lungs: Respirations unlabored; clear to auscultation bilaterally without wheeze, rales, rhonchi  CVS exam: normal rate, regular rhythm, normal S1, S2, no murmurs, rubs, clicks or gallops.  Musculoskeletal: No deformities Extremities: No edema, cyanosis Vessels: Symmetric bilaterally  Neurologic: Alert and oriented; speech intact; face symmetrical; moves all extremities well; CNII-XII intact without focal deficit   Physical Exam   Assessment:  1. Hyperlipidemia, unspecified hyperlipidemia type   2. Atypical chest pain   3. Type 2 diabetes mellitus without complication, without long-term current use of insulin ([place]   4. Benign essential HTN   5. Abnormal ultrasound  of lower extremity   6. Gastroesophageal reflux disease with esophagitis     Plan:   Return in about 1 month (around 05/03/2018) for BP follow up-recheck BP.  Orders Placed This Encounter  Procedures  . DG Knee Complete 4 Views Left    Standing Status:   Future    Number of Occurrences:   1    Standing Expiration Date:   06/03/2019    Order Specific Question:   Reason for Exam (SYMPTOM  OR DIAGNOSIS REQUIRED)    Answer:   popliteal cyst noted on 10/11 [facility] [place]    Order Specific Question:   Preferred imaging location?    Answer:   [facility]    Order Specific Question:   Radiology Contrast Protocol - do NOT remove file path    Answer:   [email or web address]  . CBC    Standing Status:   Future    Number of Occurrences:   1    Standing Expiration Date:   04/03/2019  . Basic metabolic panel    Standing Status:   Future    Number of Occurrences:   1    Standing Expiration Date:   04/03/2019    Requested Prescriptions    No prescriptions requested or ordered in this encounter     Atypical  chest pain Improving, likely [facility] F/U for new, worsening symptoms  Abnormal ultrasound of lower extremity Imaging today  F/U with sports medicine for further evaluation - DG Knee Complete 4 Views Left; Future

## 2018-04-16 ENCOUNTER — Encounter: Payer: Self-pay | Admitting: Internal Medicine

## 2018-04-19 ENCOUNTER — Telehealth: Payer: Self-pay | Admitting: *Deleted

## 2018-04-19 NOTE — Telephone Encounter (Signed)
Copied from CRM [id number]. Topic: General - Other >> Apr 14, 2018  4:41 PM Trula Slade wrote: Reason for CRM:   Patient has been summoned to jury duty but she says she cannot attend because she is still in pain on the right side of her body.  Also her blood pressure is high.  She would like a letter written stating she is not able to do jury duty. >> Apr 15, 2018  9:47 AM Marquis Buggy A wrote: Patient is over 35, she does not need a letter from her Doctor. Patient has been informed of this.  >> Apr 19, 2018  8:51 AM Crist Infante wrote: Husband is calling back to advise he believes she DOES need a letter.  He states he would rather have a letter and feel safe about this.  He hopes to get today. He can pick up. [phone]

## 2018-04-19 NOTE — Telephone Encounter (Signed)
Patient's husband informed, letter is upfront for p/u.

## 2018-04-19 NOTE — Telephone Encounter (Signed)
I have printed jury duty letter for her

## 2018-05-04 ENCOUNTER — Ambulatory Visit: Payer: PPO | Admitting: Podiatry

## 2018-05-06 ENCOUNTER — Other Ambulatory Visit: Payer: Self-pay | Admitting: Nurse Practitioner

## 2018-05-06 ENCOUNTER — Ambulatory Visit: Payer: PPO | Admitting: Nurse Practitioner

## 2018-05-06 DIAGNOSIS — Z0289 Encounter for other administrative examinations: Secondary | ICD-10-CM

## 2018-05-10 ENCOUNTER — Emergency Department (HOSPITAL_BASED_OUTPATIENT_CLINIC_OR_DEPARTMENT_OTHER)
Admission: EM | Admit: 2018-05-10 | Discharge: 2018-05-10 | Disposition: A | Discharge status: Home / Self Care | Payer: PPO | Attending: Emergency Medicine | Admitting: Emergency Medicine

## 2018-05-10 ENCOUNTER — Ambulatory Visit: Payer: Self-pay | Admitting: *Deleted

## 2018-05-10 ENCOUNTER — Other Ambulatory Visit: Payer: Self-pay

## 2018-05-10 ENCOUNTER — Encounter (HOSPITAL_BASED_OUTPATIENT_CLINIC_OR_DEPARTMENT_OTHER): Payer: Self-pay | Admitting: Emergency Medicine

## 2018-05-10 DIAGNOSIS — Z7984 Long term (current) use of oral hypoglycemic drugs: Secondary | ICD-10-CM | POA: Insufficient documentation

## 2018-05-10 DIAGNOSIS — I10 Essential (primary) hypertension: Secondary | ICD-10-CM | POA: Diagnosis not present

## 2018-05-10 DIAGNOSIS — Z79899 Other long term (current) drug therapy: Secondary | ICD-10-CM | POA: Insufficient documentation

## 2018-05-10 DIAGNOSIS — E1140 Type 2 diabetes mellitus with diabetic neuropathy, unspecified: Secondary | ICD-10-CM | POA: Insufficient documentation

## 2018-05-10 DIAGNOSIS — Z7982 Long term (current) use of aspirin: Secondary | ICD-10-CM | POA: Diagnosis not present

## 2018-05-10 NOTE — Discharge Instructions (Signed)
Please follow up with your family doctor Return if worsening

## 2018-05-10 NOTE — ED Triage Notes (Signed)
Pt reports elevated BP; out of med x 1 wk; trouble getting to PCP d/t transportation

## 2018-05-10 NOTE — Telephone Encounter (Signed)
Patient is calling to report her BP is too high- she reports her BP is high and she has not been taking her Catapres for over 1 week. Patient advised she needs to go to ED to get her BP down- it is dangerously high. She is going to have a family member drive her now.   Reason for Disposition . [1] Systolic BP  >= 160 OR Diastolic >= 100 AND [2] cardiac or neurologic symptoms (e.g., chest pain, difficulty breathing, unsteady gait, blurred vision)  Answer Assessment - Initial Assessment Questions 1. BLOOD PRESSURE: "What is the blood pressure?" "Did you take at least two measurements 5 minutes apart?"     170/101 2. ONSET: "When did you take your blood pressure?"     8:30 3. HOW: "How did you obtain the blood pressure?" (e.g., visiting nurse, automatic home BP monitor)     Automatic cuff 4. HISTORY: "Do you have a history of high blood pressure?"     yes 5. MEDICATIONS: "Are you taking any medications for blood pressure?" "Have you missed any doses recently?"     Yes-but out 1 week 6. OTHER SYMPTOMS: "Do you have any symptoms?" (e.g., headache, chest pain, blurred vision, difficulty breathing, weakness)     Headache, not feeling well 7. PREGNANCY: "Is there any chance you are pregnant?" "When was your last menstrual period?"     n/a  Protocols used: HIGH BLOOD PRESSURE-A-AH

## 2018-05-10 NOTE — ED Provider Notes (Signed)
[facility] EMERGENCY DEPARTMENT Provider Note   CSN: [id number] Arrival date & time: 05/10/18  7209     History   Chief Complaint Chief Complaint  Patient presents with  . Hypertension    HPI Meredith Phillips is a 72 y.o. female who presents with hypertension.  Past medical history significant for hypertension, hyperlipidemia, diabetes, GERD.  She states that she has been taking her blood pressure medicine but she has been taking her blood pressure at home and it has been reading in the 170s and 190s at times.  She states that when her blood pressures that high she has a really bad headache.  She has a refill at the pharmacy for her clonidine which she has not picked up yet.  She reports not missing any doses.  She was advised to come to the emergency department if her blood pressure was running that high.  She currently denies any other symptoms.  HPI  Past Medical History:  Diagnosis Date  . Arthritis   . Benign essential HTN 02/18/2015  . Cellulitis 02/18/2015  . Chicken pox   . Chronic fatigue   . Dementia ([place])   . Depression    since last 04/2018 daughter passed.    . Diabetes mellitus without complication ([place])   . Diabetic peripheral neuropathy ([place])   . GERD (gastroesophageal reflux disease)   . Hypercholesteremia   . Hypertension   . Liver disease   . Polyp of colon     Patient Active Problem List   Diagnosis Date Noted  . Hyperlipidemia 03/26/2018  . Hyponatremia 03/26/2018  . Atypical chest pain 03/25/2018  . Urge incontinence 02/25/2018  . Glucosuria 02/25/2018  . Anxiety and depression 12/16/2017  . Memory loss 11/02/2017  . B12 deficiency 11/02/2017  . Vertigo 10/10/2017  . Trochanteric bursitis of left hip 10/10/2017  . Murmur, cardiac 04/15/2017  . Asymptomatic postmenopausal estrogen deficiency 04/15/2017  . Right-sided low back pain without sciatica 02/10/2017  . Grief at loss of child 07/29/2016  . Allergic rhinitis 02/27/2016  .  GERD (gastroesophageal reflux disease) 08/06/2015  . Baker's cyst, ruptured 08/03/2015  . Type 2 diabetes mellitus ([place]) 06/29/2015  . Arthritis of left knee 06/15/2015  . Benign essential HTN 02/18/2015  . Cellulitis 02/18/2015  . Sciatica 01/17/2015  . Routine general medical examination at a health care facility 12/26/2014  . Medicare annual wellness visit, subsequent 12/26/2014    Past Surgical History:  Procedure Laterality Date  . APPENDECTOMY    . HERNIA REPAIR    . KNEE SURGERY    . ROTATOR CUFF REPAIR    . TONSILLECTOMY       OB History   None      Home Medications    Prior to Admission medications   Medication Sig Start Date End Date Taking? Authorizing Provider  aspirin EC 81 MG tablet Take 81 mg by mouth daily.    [provider]  Blood Glucose Monitoring Suppl (ONE TOUCH ULTRA 2) w/Device KIT Use to check blood sugars twice a day Dx E11.9 09/01/17   Marrian Salvage, FNP  Cholecalciferol (VITAMIN D-3) 1000 UNITS CAPS Take 1,000 Units by mouth.    [provider]  cloNIDine (CATAPRES) 0.1 MG tablet PLEASE SEE ATTACHED FOR DETAILED DIRECTIONS Patient taking differently: Take 0.1 mg by mouth as directed.  03/08/18   Marrian Salvage, FNP  esomeprazole (NEXIUM) 40 MG capsule Take 1 capsule (40 mg total) by mouth daily at 12 noon. 02/25/18  Biagio Borg, MD  fluticasone [facility]) 50 MCG/ACT nasal spray Place 2 sprays into both nostrils daily. Patient taking differently: Place 2 sprays into both nostrils as needed for allergies.  08/28/17   Marrian Salvage, FNP  gabapentin (NEURONTIN) 300 MG capsule TAKE 1 CAPSULE BY MOUTH THREE TIMES A DAY Patient taking differently: Take 300 mg by mouth 3 (three) times daily. TAKE 1 CAPSULE BY MOUTH THREE TIMES A DAY 02/17/18   Lance Sell, NP  glucose blood (ONE TOUCH ULTRA TEST) test strip USE TO CHECK BLOOD SUGARS TWICE A DAY DX E11.9 11/30/17   Lance Sell, NP  JARDIANCE 25 MG  TABS tablet TAKE 1 TABLET BY MOUTH EVERY DAY 05/06/18   Lance Sell, NP  Lancets (ONETOUCH ULTRASOFT) lancets USE TO HELP CHECK BLOOD SUGARS TWICE A DAY E11.9 11/30/17   Lance Sell, NP  meclizine (ANTIVERT) 25 MG tablet Take 0.5-1 tablets (12.5-25 mg total) by mouth 2 (two) times daily as needed for dizziness. 10/10/17   Ria Bush, MD  simvastatin (ZOCOR) 20 MG tablet TAKE 1 TABLET BY MOUTH EVERY DAY 03/12/18   Lance Sell, NP    Family History Family History  Problem Relation Age of Onset  . Healthy Mother   . Healthy Father   . Healthy Paternal Grandmother   . Healthy Paternal Grandfather     Social History Social History   Tobacco Use  . Smoking status: Never Smoker  . Smokeless tobacco: Never Used  Substance Use Topics  . Alcohol use: No  . Drug use: No     Allergies   Codeine; Other; and Shellfish allergy   Review of Systems Review of Systems  Constitutional: Negative for fever.  Eyes: Negative for visual disturbance.  Respiratory: Negative for shortness of breath.   Cardiovascular: Negative for chest pain.  Gastrointestinal: Negative for abdominal pain.  Neurological: Positive for headaches (at times). Negative for syncope.     Physical Exam Updated Vital Signs BP (!) 155/87   Pulse 61   Temp 98 F (36.7 C) (Oral)   Resp 18   Ht _0  (1.6 m)   Wt 72.6 kg   SpO2 99%   BMI 28.34 kg/m   Physical Exam  Constitutional: She is oriented to person, place, and time. She appears well-developed and well-nourished. No distress.  HENT:  Head: Normocephalic and atraumatic.  Eyes: Pupils are equal, round, and reactive to light. Conjunctivae are normal. Right eye exhibits no discharge. Left eye exhibits no discharge. No scleral icterus.  Neck: Normal range of motion.  Cardiovascular: Normal rate and regular rhythm.  Pulmonary/Chest: Effort normal and breath sounds normal. No respiratory distress.  Abdominal: Soft. Bowel sounds are  normal. She exhibits no distension. There is no tenderness.  Neurological: She is alert and oriented to person, place, and time.  Lying on stretcher in NAD. GCS 15. Speaks in a clear voice. Cranial nerves II through XII grossly intact. 5/5 strength in all extremities. Sensation fully intact.  Bilateral finger-to-nose intact. Ambulatory    Skin: Skin is warm and dry.  Psychiatric: She has a normal mood and affect. Her behavior is normal.  Nursing note and vitals reviewed.    ED Treatments / Results  Labs (all labs ordered are listed, but only abnormal results are displayed) Labs Reviewed - No data to display  EKG None  Radiology No results found.  Procedures Procedures (including critical care time)  Medications Ordered in ED Medications - No data to  display   Initial Impression / Assessment and Plan / ED Course  I have reviewed the triage vital signs and the nursing notes.  Pertinent labs & imaging results that were available during my care of the patient were reviewed by me and considered in my medical decision making (see chart for details).  72 year old female with asymptomatic hypertension.  Heart is regular rate and rhythm.  Lungs are clear to auscultation.  She has a normal neurologic exam.  She has refills of her blood pressure medicine at home.  Advise follow-up with her family doctor for medication adjustment if needed.  Final Clinical Impressions(s) / ED Diagnoses   Final diagnoses:  Hypertension, unspecified type    ED Discharge Orders    None       Recardo Evangelist, PA-C 05/10/18 1149    Isla Pence, MD 05/10/18 [phone]

## 2018-05-11 ENCOUNTER — Ambulatory Visit: Payer: PPO | Admitting: Podiatry

## 2018-05-11 DIAGNOSIS — E1151 Type 2 diabetes mellitus with diabetic peripheral angiopathy without gangrene: Secondary | ICD-10-CM | POA: Diagnosis not present

## 2018-05-11 DIAGNOSIS — M79675 Pain in left toe(s): Secondary | ICD-10-CM | POA: Diagnosis not present

## 2018-05-11 DIAGNOSIS — M79674 Pain in right toe(s): Secondary | ICD-10-CM | POA: Diagnosis not present

## 2018-05-11 DIAGNOSIS — B351 Tinea unguium: Secondary | ICD-10-CM | POA: Diagnosis not present

## 2018-05-11 NOTE — Patient Instructions (Signed)
Onychomycosis/Fungal Toenails  WHAT IS IT? An infection that lies within the keratin of your nail plate that is caused by a fungus.  WHY ME? Fungal infections affect all ages, sexes, races, and creeds.  There may be many factors that predispose you to a fungal infection such as age, coexisting medical conditions such as diabetes, or an autoimmune disease; stress, medications, fatigue, genetics, etc.  Bottom line: fungus thrives in a warm, moist environment and your shoes offer such a location.  IS IT CONTAGIOUS? Theoretically, yes.  You do not want to share shoes, nail clippers or files with someone who has fungal toenails.  Walking around barefoot in the same room or sleeping in the same bed is unlikely to transfer the organism.  It is important to realize, however, that fungus can spread easily from one nail to the next on the same foot.  HOW DO WE TREAT THIS?  There are several ways to treat this condition.  Treatment may depend on many factors such as age, medications, pregnancy, liver and kidney conditions, etc.  It is best to ask your doctor which options are available to you.  1. No treatment.   Unlike many other medical concerns, you can live with this condition.  However for many people this can be a painful condition and may lead to ingrown toenails or a bacterial infection.  It is recommended that you keep the nails cut short to help reduce the amount of fungal nail. 2. Topical treatment.  These range from herbal remedies to prescription strength nail lacquers.  About 40-50% effective, topicals require twice daily application for approximately 9 to 12 months or until an entirely new nail has grown out.  The most effective topicals are medical grade medications available through physicians offices. 3. Oral antifungal medications.  With an 80-90% cure rate, the most common oral medication requires 3 to 4 months of therapy and stays in your system for a year as the new nail grows out.  Oral  antifungal medications do require blood work to make sure it is a safe drug for you.  A liver function panel will be performed prior to starting the medication and after the first month of treatment.  It is important to have the blood work performed to avoid any harmful side effects.  In general, this medication safe but blood work is required. 4. Laser Therapy.  This treatment is performed by applying a specialized laser to the affected nail plate.  This therapy is noninvasive, fast, and non-painful.  It is not covered by insurance and is therefore, out of pocket.  The results have been very good with a 80-95% cure rate.  [facility] is the only practice in the area to offer this therapy. 5. Permanent Nail Avulsion.  Removing the entire nail so that a new nail will not grow back.  Diabetes and Foot Care Diabetes may cause you to have problems because of poor blood supply (circulation) to your feet and legs. This may cause the skin on your feet to become thinner, break easier, and heal more slowly. Your skin may become dry, and the skin may peel and crack. You may also have nerve damage in your legs and feet causing decreased feeling in them. You may not notice minor injuries to your feet that could lead to infections or more serious problems. Taking care of your feet is one of the most important things you can do for yourself. Follow these instructions at home:  Wear   shoes at all times, even in the house. Do not go barefoot. Bare feet are easily injured.  Check your feet daily for blisters, cuts, and redness. If you cannot see the bottom of your feet, use a mirror or ask someone for help.  Wash your feet with warm water (do not use hot water) and mild soap. Then pat your feet and the areas between your toes until they are completely dry. Do not soak your feet as this can dry your skin.  Apply a moisturizing lotion or petroleum jelly (that does not contain alcohol and is unscented) to the  skin on your feet and to dry, brittle toenails. Do not apply lotion between your toes.  Trim your toenails straight across. Do not dig under them or around the cuticle. File the edges of your nails with an emery board or nail file.  Do not cut corns or calluses or try to remove them with medicine.  Wear clean socks or stockings every day. Make sure they are not too tight. Do not wear knee-high stockings since they may decrease blood flow to your legs.  Wear shoes that fit properly and have enough cushioning. To break in new shoes, wear them for just a few hours a day. This prevents you from injuring your feet. Always look in your shoes before you put them on to be sure there are no objects inside.  Do not cross your legs. This may decrease the blood flow to your feet.  If you find a minor scrape, cut, or break in the skin on your feet, keep it and the skin around it clean and dry. These areas may be cleansed with mild soap and water. Do not cleanse the area with peroxide, alcohol, or iodine.  When you remove an adhesive bandage, be sure not to damage the skin around it.  If you have a wound, look at it several times a day to make sure it is healing.  Do not use heating pads or hot water bottles. They may burn your skin. If you have lost feeling in your feet or legs, you may not know it is happening until it is too late.  Make sure your health care provider performs a complete foot exam at least annually or more often if you have foot problems. Report any cuts, sores, or bruises to your health care provider immediately. Contact a health care provider if:  You have an injury that is not healing.  You have cuts or breaks in the skin.  You have an ingrown nail.  You notice redness on your legs or feet.  You feel burning or tingling in your legs or feet.  You have pain or cramps in your legs and feet.  Your legs or feet are numb.  Your feet always feel cold. Get help right away  if:  There is increasing redness, swelling, or pain in or around a wound.  There is a red line that goes up your leg.  Pus is coming from a wound.  You develop a fever or as directed by your health care provider.  You notice a bad smell coming from an ulcer or wound. This information is not intended to replace advice given to you by your health care provider. Make sure you discuss any questions you have with your health care provider. Document Released: 05/30/2000 Document Revised: 11/08/2015 Document Reviewed: 11/09/2012 Elsevier Interactive Patient Education  2017 Elsevier Inc.  

## 2018-05-14 ENCOUNTER — Ambulatory Visit: Payer: Self-pay | Admitting: *Deleted

## 2018-05-14 NOTE — Telephone Encounter (Signed)
Pt called with complaints of her blood pressure going up and down; she also complains of headaches; she went to ED on 05/10/18; the reading last reading was 05/13/18 at 1600 191/104; she has no further readings; she says that she took "a little bit of vinegar and water" x 2; she says that this is not the cuff that she normally uses; the pt also has not taken her blood pressure medication or her blood pressure this morning; recommendations made per nurse triage protocol;pt offered and accepted appointment with Alphonse Guild, [facility], 05/17/18 at 1430;  pt also instructed to take her medication this morning, and daily, and take her medication as prescribed; she was also instructed to bring her cuff to her office visit to see if correlates with office cuff; she verbalized understanding; will route to office for notification of this upcoming appointment.      Reason for Disposition . Systolic BP  >= 160 OR Diastolic >= 100  Answer Assessment - Initial Assessment Questions 1. BLOOD PRESSURE: "What is the blood pressure?" "Did you take at least two measurements 5 minutes apart?"     Seen in ED 05/09/18; 191/104 2. ONSET: "When did you take your blood pressure?"     05/13/18 at 1600 3. HOW: "How did you obtain the blood pressure?" (e.g., visiting nurse, automatic home BP monitor)    Automatic cuff at home; left upper arm  4. HISTORY: "Do you have a history of high blood pressure?"     yes 5. MEDICATIONS: "Are you taking any medications for blood pressure?" "Have you missed any doses recently?"     no 6. OTHER SYMPTOMS: "Do you have any symptoms?" (e.g., headache, chest pain, blurred vision, difficulty breathing, weakness)     headache 7. PREGNANCY: "Is there any chance you are pregnant?" "When was your last menstrual period?"     no  Protocols used: HIGH BLOOD PRESSURE-A-AH

## 2018-05-16 ENCOUNTER — Other Ambulatory Visit: Payer: Self-pay | Admitting: Nurse Practitioner

## 2018-05-17 ENCOUNTER — Encounter: Payer: Self-pay | Admitting: Nurse Practitioner

## 2018-05-17 ENCOUNTER — Other Ambulatory Visit (INDEPENDENT_AMBULATORY_CARE_PROVIDER_SITE_OTHER): Payer: PPO

## 2018-05-17 ENCOUNTER — Ambulatory Visit (INDEPENDENT_AMBULATORY_CARE_PROVIDER_SITE_OTHER): Payer: PPO | Admitting: Nurse Practitioner

## 2018-05-17 VITALS — BP 178/104 | HR 88 | Ht 63.0 in | Wt 161.0 lb

## 2018-05-17 DIAGNOSIS — R7989 Other specified abnormal findings of blood chemistry: Secondary | ICD-10-CM | POA: Diagnosis not present

## 2018-05-17 DIAGNOSIS — K2100 Gastro-esophageal reflux disease with esophagitis, without bleeding: Secondary | ICD-10-CM

## 2018-05-17 DIAGNOSIS — E785 Hyperlipidemia, unspecified: Secondary | ICD-10-CM | POA: Diagnosis not present

## 2018-05-17 DIAGNOSIS — I10 Essential (primary) hypertension: Secondary | ICD-10-CM | POA: Diagnosis not present

## 2018-05-17 LAB — CBC
HCT: 41.9 % (ref 36.0–46.0)
Hemoglobin: 13.8 g/dL (ref 12.0–15.0)
MCHC: 33 g/dL (ref 30.0–36.0)
MCV: 86.7 fl (ref 78.0–100.0)
Platelets: 376 10*3/uL (ref 150.0–400.0)
RBC: 4.83 Mil/uL (ref 3.87–5.11)
RDW: 15.1 % (ref 11.5–15.5)
WBC: 6.6 10*3/uL (ref 4.0–10.5)

## 2018-05-17 MED ORDER — SIMVASTATIN 20 MG PO TABS: 20.0000 mg | ORAL_TABLET | Freq: Every day | ORAL | 2 refills | Status: DC

## 2018-05-17 MED ORDER — CLONIDINE HCL 0.1 MG PO TABS: 0.1000 mg | ORAL_TABLET | Freq: Two times a day (BID) | ORAL | 1 refills | Status: DC

## 2018-05-17 MED ORDER — ESOMEPRAZOLE MAGNESIUM 40 MG PO CPDR: 40.0000 mg | DELAYED_RELEASE_CAPSULE | Freq: Every day | ORAL | 2 refills | Status: DC

## 2018-05-17 MED FILL — ESOMEPRAZOLE MAG DR 40 MG C: 40 | 90 days supply | Qty: 90 | Fill #0

## 2018-05-17 MED FILL — cloNIDine HCL 0.1 MG TABS: 0.1 | 30 days supply | Qty: 60 | Fill #0

## 2018-05-17 NOTE — Progress Notes (Signed)
Meredith Phillips is a 72 y.o. female with the following history as recorded in EpicCare:  Patient Active Problem List   Diagnosis Date Noted  . Hyperlipidemia 03/26/2018  . Hyponatremia 03/26/2018  . Atypical chest pain 03/25/2018  . Urge incontinence 02/25/2018  . Glucosuria 02/25/2018  . Anxiety and depression 12/16/2017  . Memory loss 11/02/2017  . B12 deficiency 11/02/2017  . Vertigo 10/10/2017  . Trochanteric bursitis of left hip 10/10/2017  . Murmur, cardiac 04/15/2017  . Asymptomatic postmenopausal estrogen deficiency 04/15/2017  . Right-sided low back pain without sciatica 02/10/2017  . Grief at loss of child 07/29/2016  . Allergic rhinitis 02/27/2016  . GERD (gastroesophageal reflux disease) 08/06/2015  . Baker's cyst, ruptured 08/03/2015  . Type 2 diabetes mellitus ([place]) 06/29/2015  . Arthritis of left knee 06/15/2015  . Benign essential HTN 02/18/2015  . Cellulitis 02/18/2015  . Sciatica 01/17/2015  . Routine general medical examination at a health care facility 12/26/2014  . Medicare annual wellness visit, subsequent 12/26/2014  . Generalized osteoarthritis of multiple sites 11/25/2014  . GERD with esophagitis 11/25/2014  . Hip arthrosis 11/25/2014  . Diarrhea in adult patient 11/29/2013    Current Outpatient Medications  Medication Sig Dispense Refill  . aspirin EC 81 MG tablet Take 81 mg by mouth daily.    . Blood Glucose Monitoring Suppl (ONE TOUCH ULTRA 2) w/Device KIT Use to check blood sugars twice a day Dx E11.9 1 each 0  . Cholecalciferol (VITAMIN D-3) 1000 UNITS CAPS Take 1,000 Units by mouth.    . cloNIDine (CATAPRES) 0.1 MG tablet Take 1 tablet (0.1 mg total) by mouth 2 (two) times daily. 60 tablet 1  . esomeprazole (NEXIUM) 40 MG capsule Take 1 capsule (40 mg total) by mouth daily at 12 noon. 90 capsule 2  . fluticasone (FLONASE) 50 MCG/ACT nasal spray Place 2 sprays into both nostrils daily. (Patient taking differently: Place 2 sprays into both  nostrils as needed for allergies. ) 16 g 6  . gabapentin (NEURONTIN) 300 MG capsule Take 1 capsule (300 mg total) by mouth 3 (three) times daily. TAKE 1 CAPSULE BY MOUTH THREE TIMES A DAY 270 capsule 0  . glucose blood (ONE TOUCH ULTRA TEST) test strip USE TO CHECK BLOOD SUGARS TWICE A DAY DX E11.9 100 each 1  . JARDIANCE 25 MG TABS tablet TAKE 1 TABLET BY MOUTH EVERY DAY 30 tablet 1  . Lancets (ONETOUCH ULTRASOFT) lancets USE TO HELP CHECK BLOOD SUGARS TWICE A DAY E11.9 100 each 1  . meclizine (ANTIVERT) 25 MG tablet Take 0.5-1 tablets (12.5-25 mg total) by mouth 2 (two) times daily as needed for dizziness. 30 tablet 0  . simvastatin (ZOCOR) 20 MG tablet Take 1 tablet (20 mg total) by mouth daily. 90 tablet 2   No current facility-administered medications for this visit.     Allergies: Codeine; Other; and Shellfish allergy  Past Medical History:  Diagnosis Date  . Arthritis   . Benign essential HTN 02/18/2015  . Cellulitis 02/18/2015  . Chicken pox   . Chronic fatigue   . Dementia ([place])   . Depression    since last 04/2018 daughter passed.    . Diabetes mellitus without complication ([place])   . Diabetic peripheral neuropathy ([place])   . GERD (gastroesophageal reflux disease)   . Hypercholesteremia   . Hypertension   . Liver disease   . Polyp of colon     Past Surgical History:  Procedure Laterality Date  . APPENDECTOMY    .  HERNIA REPAIR    . KNEE SURGERY    . ROTATOR CUFF REPAIR    . TONSILLECTOMY      Family History  Problem Relation Age of Onset  . Healthy Mother   . Healthy Father   . Healthy Paternal Grandmother   . Healthy Paternal Grandfather     Social History   Tobacco Use  . Smoking status: Never Smoker  . Smokeless tobacco: Never Used  Substance Use Topics  . Alcohol use: No     Subjective:  Ms adel is here today for ED follow up, seen in [facility] ED on 05/10/18 for elevated BP despite reported daily medication compliance, her BP was 155/87 in the ED on 11/25  and her PE was normal so she was discharged home without further workup, instructed to f/u with PCP for further BP management. I last saw her on 04/02/18, she was only taking clonidine 0.22m at bedtime for her blood pressure, she had stopped amlodipine, losartan, carvedilol on her own because the medications made her "dizzy", her BP was 144/82 on 10/18 visit so she was instructed to continue clonidine at bedtime only and return in 1 moth for follow up. She tells me today that she takes her clonidine 0.1 daily as prescribed, has been checking her BP daily at home, recent readings 150s-160s/100s. Denies syncope, CP, sob, edema. Has noticed some headaches when her BP is higher. Also tells me she is stressed with family issues right now, thinks that is contributing to her elevated BP readings She is also requesting medication refills today, and we will recheck her CBC to f/u on elevated platelets noted on 04/02/18 labs  BP Readings from Last 3 Encounters:  05/17/18 (!) 178/104  05/10/18 (!) 155/87  04/02/18 (!) 148/82    ROS- See HPI  Objective:  Vitals:   05/17/18 1411 05/17/18 1452  BP: (!) 180/100 (!) 178/104  Pulse: 88   SpO2: 97%   Weight: 161 lb (73 kg)   Height: '5\' 3"'  (1.6 m)     General: Well developed, well nourished, in no acute distress  Skin : Warm and dry.  Head: Normocephalic and atraumatic  Eyes: Sclera and conjunctiva clear; pupils round and reactive to light; extraocular movements intact  Oropharynx: Pink, supple.  Neck: Supple   Lungs: Respirations unlabored; clear to auscultation bilaterally without wheeze, rales, rhonchi  CVS exam: normal rate, regular rhythm, normal S1, S2, no murmurs, rubs, clicks or gallops.  Musculoskeletal: No deformities Extremities: No edema, cyanosis Vessels: Symmetric bilaterally  Neurologic: Alert and oriented; speech intact; face symmetrical; moves all extremities well; CNII-XII intact without focal deficit   Assessment:  1. Benign  essential HTN   2. Gastroesophageal reflux disease with esophagitis   3. GERD with esophagitis   4. Hyperlipidemia, unspecified hyperlipidemia type   5. Abnormal CBC     Plan:   Return in about 3 weeks (around 06/07/2018) for F/U: HTN- recheck BP.  Orders Placed This Encounter  Procedures  . CBC    Standing Status:   Future    Number of Occurrences:   1    Standing Expiration Date:   05/18/2019    Requested Prescriptions   Signed Prescriptions Disp Refills  . esomeprazole (NEXIUM) 40 MG capsule 90 capsule 2    Sig: Take 1 capsule (40 mg total) by mouth daily at 12 noon.  . simvastatin (ZOCOR) 20 MG tablet 90 tablet 2    Sig: Take 1 tablet (20 mg total) by mouth daily.  .[name]  cloNIDine (CATAPRES) 0.1 MG tablet 60 tablet 1    Sig: Take 1 tablet (0.1 mg total) by mouth 2 (two) times daily.   Abnormal CBC Recheck CBC F/U with further recommendations pending lab results - CBC; Future

## 2018-05-17 NOTE — Assessment & Plan Note (Signed)
BP quite elevated on current dosage of clonidine, otherwise vitals and PE are stable today Increased clonidine to BID-dosing, side effects discussed Continue to monitor BP readings at home RTC in about 3 weeks for f/u - cloNIDine (CATAPRES) 0.1 MG tablet; Take 1 tablet (0.1 mg total) by mouth 2 (two) times daily.  Dispense: 60 tablet; Refill: 1

## 2018-05-17 NOTE — Assessment & Plan Note (Signed)
Stable Continue simvastatin Labs are up to date - simvastatin (ZOCOR) 20 MG tablet; Take 1 tablet (20 mg total) by mouth daily.  Dispense: 90 tablet; Refill: 2

## 2018-05-17 NOTE — Patient Instructions (Addendum)
Please head downstairs for lab work  Please increase your clonidine to 0.1mg  twice daily.   DASH Eating Plan DASH stands for "Dietary Approaches to Stop Hypertension." The DASH eating plan is a healthy eating plan that has been shown to reduce high blood pressure (hypertension). It may also reduce your risk for type 2 diabetes, heart disease, and stroke. The DASH eating plan may also help with weight loss. What are tips for following this plan? General guidelines  Avoid eating more than 2,300 mg (milligrams) of salt (sodium) a day. If you have hypertension, you may need to reduce your sodium intake to 1,500 mg a day.  Limit alcohol intake to no more than 1 drink a day for nonpregnant women and 2 drinks a day for men. One drink equals 12 oz of beer, 5 oz of wine, or 1 oz of hard liquor.  Work with your health care provider to maintain a healthy body weight or to lose weight. Ask what an ideal weight is for you.  Get at least 30 minutes of exercise that causes your heart to beat faster (aerobic exercise) most days of the week. Activities may include walking, swimming, or biking.  Work with your health care provider or diet and nutrition specialist (dietitian) to adjust your eating plan to your individual calorie needs. Reading food labels  Check food labels for the amount of sodium per serving. Choose foods with less than 5 percent of the Daily Value of sodium. Generally, foods with less than 300 mg of sodium per serving fit into this eating plan.  To find whole grains, look for the word "whole" as the first word in the ingredient list. Shopping  Buy products labeled as "low-sodium" or "no salt added."  Buy fresh foods. Avoid canned foods and premade or frozen meals. Cooking  Avoid adding salt when cooking. Use salt-free seasonings or herbs instead of table salt or sea salt. Check with your health care provider or pharmacist before using salt substitutes.  Do not fry foods. Cook foods  using healthy methods such as baking, boiling, grilling, and broiling instead.  Cook with heart-healthy oils, such as olive, canola, soybean, or sunflower oil. Meal planning   Eat a balanced diet that includes: ? 5 or more servings of fruits and vegetables each day. At each meal, try to fill half of your plate with fruits and vegetables. ? Up to 6-8 servings of whole grains each day. ? Less than 6 oz of lean meat, poultry, or fish each day. A 3-oz serving of meat is about the same size as a deck of cards. One egg equals 1 oz. ? 2 servings of low-fat dairy each day. ? A serving of nuts, seeds, or beans 5 times each week. ? Heart-healthy fats. Healthy fats called Omega-3 fatty acids are found in foods such as flaxseeds and coldwater fish, like sardines, salmon, and mackerel.  Limit how much you eat of the following: ? Canned or prepackaged foods. ? Food that is high in trans fat, such as fried foods. ? Food that is high in saturated fat, such as fatty meat. ? Sweets, desserts, sugary drinks, and other foods with added sugar. ? Full-fat dairy products.  Do not salt foods before eating.  Try to eat at least 2 vegetarian meals each week.  Eat more home-cooked food and less restaurant, buffet, and fast food.  When eating at a restaurant, ask that your food be prepared with less salt or no salt, if possible. What foods  are recommended? The items listed may not be a complete list. Talk with your dietitian about what dietary choices are best for you. Grains Whole-grain or whole-wheat bread. Whole-grain or whole-wheat pasta. Brown rice. [place]. Bulgur. Whole-grain and low-sodium cereals. Pita bread. Low-fat, low-sodium crackers. Whole-wheat flour tortillas. Vegetables Fresh or frozen vegetables (raw, steamed, roasted, or grilled). Low-sodium or reduced-sodium tomato and vegetable juice. Low-sodium or reduced-sodium tomato sauce and tomato paste. Low-sodium or reduced-sodium canned  vegetables. Fruits All fresh, dried, or frozen fruit. Canned fruit in natural juice (without added sugar). Meat and other protein foods Skinless chicken or [place]. Ground chicken or [place]. Pork with fat trimmed off. Fish and seafood. Egg whites. Dried beans, peas, or lentils. Unsalted nuts, nut butters, and seeds. Unsalted canned beans. Lean cuts of beef with fat trimmed off. Low-sodium, lean deli meat. Dairy Low-fat (1%) or fat-free (skim) milk. Fat-free, low-fat, or reduced-fat cheeses. Nonfat, low-sodium ricotta or cottage cheese. Low-fat or nonfat yogurt. Low-fat, low-sodium cheese. Fats and oils Soft margarine without trans fats. Vegetable oil. Low-fat, reduced-fat, or light mayonnaise and salad dressings (reduced-sodium). Canola, safflower, olive, soybean, and sunflower oils. Avocado. Seasoning and other foods Herbs. Spices. Seasoning mixes without salt. Unsalted popcorn and pretzels. Fat-free sweets. What foods are not recommended? The items listed may not be a complete list. Talk with your dietitian about what dietary choices are best for you. Grains Baked goods made with fat, such as croissants, muffins, or some breads. Dry pasta or rice meal packs. Vegetables Creamed or fried vegetables. Vegetables in a cheese sauce. Regular canned vegetables (not low-sodium or reduced-sodium). Regular canned tomato sauce and paste (not low-sodium or reduced-sodium). Regular tomato and vegetable juice (not low-sodium or reduced-sodium). Angie Fava. Olives. Fruits Canned fruit in a light or heavy syrup. Fried fruit. Fruit in cream or butter sauce. Meat and other protein foods Fatty cuts of meat. Ribs. Fried meat. Berniece Salines. Sausage. Bologna and other processed lunch meats. Salami. Fatback. Hotdogs. Bratwurst. Salted nuts and seeds. Canned beans with added salt. Canned or smoked fish. Whole eggs or egg yolks. Chicken or [place] with skin. Dairy Whole or 2% milk, cream, and half-and-half. Whole or full-fat  cream cheese. Whole-fat or sweetened yogurt. Full-fat cheese. Nondairy creamers. Whipped toppings. Processed cheese and cheese spreads. Fats and oils Butter. Stick margarine. Lard. Shortening. Ghee. Bacon fat. Tropical oils, such as coconut, palm kernel, or palm oil. Seasoning and other foods Salted popcorn and pretzels. Onion salt, garlic salt, seasoned salt, table salt, and sea salt. Worcestershire sauce. Tartar sauce. Barbecue sauce. Teriyaki sauce. Soy sauce, including reduced-sodium. Steak sauce. Canned and packaged gravies. Fish sauce. Oyster sauce. Cocktail sauce. Horseradish that you find on the shelf. Ketchup. Mustard. Meat flavorings and tenderizers. Bouillon cubes. Hot sauce and Tabasco sauce. Premade or packaged marinades. Premade or packaged taco seasonings. Relishes. Regular salad dressings. Where to find more information:  National Heart, Lung, and [place]: https://wilson-eaton.com/  American Heart Association: www.heart.org Summary  The DASH eating plan is a healthy eating plan that has been shown to reduce high blood pressure (hypertension). It may also reduce your risk for type 2 diabetes, heart disease, and stroke.  With the DASH eating plan, you should limit salt (sodium) intake to 2,300 mg a day. If you have hypertension, you may need to reduce your sodium intake to 1,500 mg a day.  When on the DASH eating plan, aim to eat more fresh fruits and vegetables, whole grains, lean proteins, low-fat dairy, and heart-healthy fats.  Work with your  health care provider or diet and nutrition specialist (dietitian) to adjust your eating plan to your individual calorie needs. This information is not intended to replace advice given to you by your health care provider. Make sure you discuss any questions you have with your health care provider. Document Released: 05/22/2011 Document Revised: 05/26/2016 Document Reviewed: 05/26/2016 Elsevier Interactive Patient Education  [employer].

## 2018-05-17 NOTE — Assessment & Plan Note (Signed)
Stable Continue current medication F/u for new, worsening symptoms - esomeprazole (NEXIUM) 40 MG capsule; Take 1 capsule (40 mg total) by mouth daily at 12 noon.  Dispense: 90 capsule; Refill: 2

## 2018-05-23 ENCOUNTER — Other Ambulatory Visit: Payer: Self-pay | Admitting: Internal Medicine

## 2018-05-23 DIAGNOSIS — K2100 Gastro-esophageal reflux disease with esophagitis, without bleeding: Secondary | ICD-10-CM
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
## 2018-05-26 ENCOUNTER — Ambulatory Visit (INDEPENDENT_AMBULATORY_CARE_PROVIDER_SITE_OTHER): Payer: PPO | Admitting: Nurse Practitioner

## 2018-05-26 ENCOUNTER — Encounter: Payer: Self-pay | Admitting: Nurse Practitioner

## 2018-05-26 VITALS — BP 134/84 | HR 73 | Ht 63.0 in | Wt 161.4 lb

## 2018-05-26 DIAGNOSIS — R413 Other amnesia: Secondary | ICD-10-CM

## 2018-05-26 NOTE — Patient Instructions (Signed)
Check MRI brain please call to schedule [phone] May  consider memantine 18m at bedtime; increase to twice a day after 1-2 weekspt wants to wait until after MRI done  Safety / supervision issues reviewed Headaches improved at present ESR CRP were normal  Follow up 6 months

## 2018-05-30 ENCOUNTER — Encounter: Payer: Self-pay | Admitting: Podiatry

## 2018-05-30 NOTE — Progress Notes (Signed)
Subjective: Patient presents today with diabetes, diabetic neuropathy and cc of painful, discolored, thick toenails which interfere with daily activities. Pain is aggravated when wearing enclosed shoe gear. Pain is getting progressively worse and relieved with periodic professional debridement.  Lance Sell, NP is her PCP; last seen on 04/02/2018.   Current Outpatient Medications:  .  aspirin EC 81 MG tablet, Take 81 mg by mouth daily., Disp: , Rfl:  .  Blood Glucose Monitoring Suppl (ONE TOUCH ULTRA 2) w/Device KIT, Use to check blood sugars twice a day Dx E11.9, Disp: 1 each, Rfl: 0 .  Cholecalciferol (VITAMIN D-3) 1000 UNITS CAPS, Take 1,000 Units by mouth., Disp: , Rfl:  .  fluticasone (FLONASE) 50 MCG/ACT nasal spray, Place 2 sprays into both nostrils daily. (Patient taking differently: Place 2 sprays into both nostrils as needed for allergies. ), Disp: 16 g, Rfl: 6 .  glucose blood (ONE TOUCH ULTRA TEST) test strip, USE TO CHECK BLOOD SUGARS TWICE A DAY DX E11.9, Disp: 100 each, Rfl: 1 .  JARDIANCE 25 MG TABS tablet, TAKE 1 TABLET BY MOUTH EVERY DAY, Disp: 30 tablet, Rfl: 1 .  Lancets (ONETOUCH ULTRASOFT) lancets, USE TO HELP CHECK BLOOD SUGARS TWICE A DAY E11.9, Disp: 100 each, Rfl: 1 .  meclizine (ANTIVERT) 25 MG tablet, Take 0.5-1 tablets (12.5-25 mg total) by mouth 2 (two) times daily as needed for dizziness., Disp: 30 tablet, Rfl: 0 .  amLODipine (NORVASC) 10 MG tablet, Take 10 mg by mouth daily., Disp: , Rfl: 1 .  cloNIDine (CATAPRES) 0.1 MG tablet, Take 1 tablet (0.1 mg total) by mouth 2 (two) times daily., Disp: 60 tablet, Rfl: 1 .  esomeprazole (NEXIUM) 40 MG capsule, TAKE 1 CAPSULE (40 MG TOTAL) BY MOUTH DAILY AT 12 NOON., Disp: 90 capsule, Rfl: 1 .  gabapentin (NEURONTIN) 300 MG capsule, Take 1 capsule (300 mg total) by mouth 3 (three) times daily. TAKE 1 CAPSULE BY MOUTH THREE TIMES A DAY, Disp: 270 capsule, Rfl: 0 .  simvastatin (ZOCOR) 20 MG tablet, Take 1 tablet (20  mg total) by mouth daily., Disp: 90 tablet, Rfl: 2   Allergies  Allergen Reactions  . Codeine Other (See Comments)    Woke up the next morning and did not know where she was  . Other     Doesn't like to take pain medication due to hallucinations.  . Shellfish Allergy      Objective:  Vascular Examination: Capillary refill time <3 seconds x 10 digits Dorsalis pedis pulses palpable b/l Posterior tibial pulses absent b/l No digital hair x 10 digits Skin temperature gradient WNL b/l  Dermatological Examination: Skin mildly atrophic b/l  Toenails 1-5 b/l discolored, thick, dystrophic with subungual debris and pain with palpation to nailbeds due to thickness of nails.  No open wounds.  No interdigital macerations.   Musculoskeletal: Muscle strength 5/5 to all LE muscle groups  Neurological: Sensation intact with 10 gram monofilament. Vibratory sensation intact.  Assessment: 1. Painful onychomycosis toenails 1-5 b/l 2. NIDDM with Peripheral arterial disease  Plan: 1. Toenails 1-5 b/l were debrided in length and girth without iatrogenic bleeding. 2. Patient to continue soft, supportive shoe gear 3. Patient to report any pedal injuries to medical professional  4. Follow up 3 months. Patient/POA to call should there be a concern in the interim.

## 2018-06-11 ENCOUNTER — Ambulatory Visit
Admission: RE | Admit: 2018-06-11 | Discharge: 2018-06-11 | Disposition: A | Discharge status: Home / Self Care | Payer: PPO | Source: Ambulatory Visit | Attending: Diagnostic Neuroimaging | Admitting: Diagnostic Neuroimaging

## 2018-06-11 DIAGNOSIS — R413 Other amnesia: Secondary | ICD-10-CM | POA: Diagnosis not present

## 2018-06-17 ENCOUNTER — Ambulatory Visit: Payer: Self-pay

## 2018-06-17 NOTE — Telephone Encounter (Signed)
Returned call to patient who states that she has had a cough and sore throat since December.  She is hoarse and concerned about her voice.  As we were speaking she asked for advice for dealing with what she states is too much hair in her private area.  She states that she can feel it cutting her when she and her husband have intercourse.  Pt states she has had a spot of bleeding. She is very embarrassed to talk about this symptom. She states she has become worried because it has remained sore.  She states it is inside area that burns.  She said that someone told her to use A and D ointment to the area. She stated that it helped. Appointment scheduled per protocol.  Care advice read to patient.  Patient verbalized understanding of all instructions.  Reason for Disposition . [1] Rash (e.g., redness, tiny bumps, sore) of genital area AND [2] present > 24 hours  Answer Assessment - Initial Assessment Questions 1. ONSET: "When did the throat start hurting?" (Hours or days ago)      December 2. SEVERITY: "How bad is the sore throat?" (Scale 1-10; mild, moderate or severe)   - MILD (1-3):  doesn't interfere with eating or normal activities   - MODERATE (4-7): interferes with eating some solids and normal activities   - SEVERE (8-10):  excruciating pain, interferes with most normal activities   - SEVERE DYSPHAGIA: can't swallow liquids, drooling     Mild but lingers 3. STREP EXPOSURE: "Has there been any exposure to strep within the past week?" If so, ask: "What type of contact occurred?"      no 4.  VIRAL SYMPTOMS: "Are there any symptoms of a cold, such as a runny nose, cough, hoarse voice or red eyes?"      Cough Hoarse  5. FEVER: "Do you have a fever?" If so, ask: "What is your temperature, how was it measured, and when did it start?"     no 6. PUS ON THE TONSILS: "Is there pus on the tonsils in the back of your throat?"     no 7. OTHER SYMPTOMS: "Do you have any other symptoms?" (e.g., difficulty  breathing, headache, rash)     no 8. PREGNANCY: "Is there any chance you are pregnant?" "When was your last menstrual period?"     N/A  Answer Assessment - Initial Assessment Questions 1. SYMPTOM: "What's the main symptom you're concerned about?" (e.g., pain, itching, dryness)     Painful raw 2. LOCATION: "Where is the  raw located?" (e.g., inside/outside, left/right)     inside 3. ONSET: "When did the  raw  start?"     Couple weeks 4. PAIN: "Is there any pain?" If so, ask: "How bad is it?" (Scale: 1-10; mild, moderate, severe)     Stinging on outside 5. ITCHING: "Is there any itching?" If so, ask: "How bad is it?" (Scale: 1-10; mild, moderate, severe)     No 0 6. CAUSE: "What do you think is causing the discharge?" "Have you had the same problem before? What happened then?"     Hair with sex 7. OTHER SYMPTOMS: "Do you have any other symptoms?" (e.g., fever, itching, vaginal bleeding, pain with urination, injury to genital area, vaginal foreign body)     A little blood spot with intercourse 8. PREGNANCY: "Is there any chance you are pregnant?" "When was your last menstrual period?"     N/A  Protocols used: VAGINAL SYMPTOMS-A-AH, SORE THROAT-A-AH

## 2018-06-18 ENCOUNTER — Encounter: Payer: Self-pay | Admitting: Nurse Practitioner

## 2018-06-18 ENCOUNTER — Ambulatory Visit (INDEPENDENT_AMBULATORY_CARE_PROVIDER_SITE_OTHER): Payer: PPO | Admitting: Nurse Practitioner

## 2018-06-18 VITALS — BP 144/90 | HR 74 | Temp 98.2°F | Ht 63.0 in | Wt 161.0 lb

## 2018-06-18 DIAGNOSIS — M25511 Pain in right shoulder: Secondary | ICD-10-CM | POA: Diagnosis not present

## 2018-06-18 DIAGNOSIS — R102 Pelvic and perineal pain: Secondary | ICD-10-CM | POA: Diagnosis not present

## 2018-06-18 MED ORDER — MELOXICAM 7.5 MG PO TABS: 7.5000 mg | ORAL_TABLET | Freq: Every day | ORAL | 0 refills | Status: DC

## 2018-06-18 NOTE — Patient Instructions (Addendum)
Start mobic once daily for shoulder pain  Please follow up if your pain gets worse, or does not get better with mobic, or if you are still having pain in 1 week   Shoulder Pain Many things can cause shoulder pain, including:  An injury.  Moving the shoulder in the same way again and again (overuse).  Joint pain (arthritis). Pain can come from:  Swelling and irritation (inflammation) of any part of the shoulder.  An injury to the shoulder joint.  An injury to: ? Tissues that connect muscle to bone (tendons). ? Tissues that connect bones to each other (ligaments). ? Bones. Follow these instructions at home: Watch for changes in your symptoms. Let your doctor know about them. Follow these instructions to help with your pain. If you have a sling:  Wear the sling as told by your doctor. Remove it only as told by your doctor.  Loosen the sling if your fingers: ? Tingle. ? Become numb. ? Turn cold and blue.  Keep the sling clean.  If the sling is not waterproof: ? Do not let it get wet. ? Take the sling off when you shower or bathe. Managing pain, stiffness, and swelling   If told, put ice on the painful area: ? Put ice in a plastic bag. ? Place a towel between your skin and the bag. ? Leave the ice on for 20 minutes, 2-3 times a day. Stop putting ice on if it does not help with the pain.  Squeeze a soft ball or a foam pad as much as possible. This prevents swelling in the shoulder. It also helps to strengthen the arm. General instructions  Take over-the-counter and prescription medicines only as told by your doctor.  Keep all follow-up visits as told by your doctor. This is important. Contact a doctor if:  Your pain gets worse.  Medicine does not help your pain.  You have new pain in your arm, hand, or fingers. Get help right away if:  Your arm, hand, or fingers: ? Tingle. ? Are numb. ? Are swollen. ? Are painful. ? Turn white or blue. Summary  Shoulder  pain can be caused by many things. These include injury, moving the shoulder in the same away again and again, and joint pain.  Watch for changes in your symptoms. Let your doctor know about them.  This condition may be treated with a sling, ice, and pain medicine.  Contact your doctor if the pain gets worse or you have new pain. Get help right away if your arm, hand, or fingers tingle or get numb, swollen, or painful.  Keep all follow-up visits as told by your doctor. This is important. This information is not intended to replace advice given to you by your health care provider. Make sure you discuss any questions you have with your health care provider. Document Released: 11/19/2007 Document Revised: 12/15/2017 Document Reviewed: 12/15/2017 Elsevier Interactive Patient Education  2019 [employer].

## 2018-06-18 NOTE — Progress Notes (Signed)
Meredith Phillips is a 73 y.o. female with the following history as recorded in EpicCare:  Patient Active Problem List   Diagnosis Date Noted  . Hyperlipidemia 03/26/2018  . Hyponatremia 03/26/2018  . Atypical chest pain 03/25/2018  . Urge incontinence 02/25/2018  . Glucosuria 02/25/2018  . Anxiety and depression 12/16/2017  . Memory loss 11/02/2017  . B12 deficiency 11/02/2017  . Vertigo 10/10/2017  . Trochanteric bursitis of left hip 10/10/2017  . Murmur, cardiac 04/15/2017  . Asymptomatic postmenopausal estrogen deficiency 04/15/2017  . Right-sided low back pain without sciatica 02/10/2017  . Grief at loss of child 07/29/2016  . Allergic rhinitis 02/27/2016  . GERD (gastroesophageal reflux disease) 08/06/2015  . Baker's cyst, ruptured 08/03/2015  . Type 2 diabetes mellitus ([place]) 06/29/2015  . Arthritis of left knee 06/15/2015  . Benign essential HTN 02/18/2015  . Cellulitis 02/18/2015  . Sciatica 01/17/2015  . Routine general medical examination at a health care facility 12/26/2014  . Medicare annual wellness visit, subsequent 12/26/2014  . Generalized osteoarthritis of multiple sites 11/25/2014  . GERD with esophagitis 11/25/2014  . Hip arthrosis 11/25/2014  . Diarrhea in adult patient 11/29/2013    Current Outpatient Medications  Medication Sig Dispense Refill  . amLODipine (NORVASC) 10 MG tablet Take 10 mg by mouth daily.  1  . aspirin EC 81 MG tablet Take 81 mg by mouth daily.    . Blood Glucose Monitoring Suppl (ONE TOUCH ULTRA 2) w/Device KIT Use to check blood sugars twice a day Dx E11.9 1 each 0  . Cholecalciferol (VITAMIN D-3) 1000 UNITS CAPS Take 1,000 Units by mouth.    . cloNIDine (CATAPRES) 0.1 MG tablet Take 1 tablet (0.1 mg total) by mouth 2 (two) times daily. 60 tablet 1  . esomeprazole (NEXIUM) 40 MG capsule TAKE 1 CAPSULE (40 MG TOTAL) BY MOUTH DAILY AT 12 NOON. 90 capsule 1  . fluticasone (FLONASE) 50 MCG/ACT nasal spray Place 2 sprays into both nostrils  daily. (Patient taking differently: Place 2 sprays into both nostrils as needed for allergies. ) 16 g 6  . gabapentin (NEURONTIN) 300 MG capsule Take 1 capsule (300 mg total) by mouth 3 (three) times daily. TAKE 1 CAPSULE BY MOUTH THREE TIMES A DAY 270 capsule 0  . glucose blood (ONE TOUCH ULTRA TEST) test strip USE TO CHECK BLOOD SUGARS TWICE A DAY DX E11.9 100 each 1  . JARDIANCE 25 MG TABS tablet TAKE 1 TABLET BY MOUTH EVERY DAY 30 tablet 1  . Lancets (ONETOUCH ULTRASOFT) lancets USE TO HELP CHECK BLOOD SUGARS TWICE A DAY E11.9 100 each 1  . meclizine (ANTIVERT) 25 MG tablet Take 0.5-1 tablets (12.5-25 mg total) by mouth 2 (two) times daily as needed for dizziness. 30 tablet 0  . simvastatin (ZOCOR) 20 MG tablet Take 1 tablet (20 mg total) by mouth daily. 90 tablet 2  . meloxicam (MOBIC) 7.5 MG tablet Take 1 tablet (7.5 mg total) by mouth daily. 30 tablet 0   No current facility-administered medications for this visit.     Allergies: Codeine; Other; and Shellfish allergy  Past Medical History:  Diagnosis Date  . Arthritis   . Benign essential HTN 02/18/2015  . Cellulitis 02/18/2015  . Chicken pox   . Chronic fatigue   . Dementia ([place])   . Depression    since last 04/2018 daughter passed.    . Diabetes mellitus without complication ([place])   . Diabetic peripheral neuropathy ([place])   . GERD (gastroesophageal reflux disease)   .  Hypercholesteremia   . Hypertension   . Liver disease   . Polyp of colon     Past Surgical History:  Procedure Laterality Date  . APPENDECTOMY    . HERNIA REPAIR    . KNEE SURGERY    . ROTATOR CUFF REPAIR    . TONSILLECTOMY      Family History  Problem Relation Age of Onset  . Healthy Mother   . Healthy Father   . Healthy Paternal Grandmother   . Healthy Paternal Grandfather     Social History   Tobacco Use  . Smoking status: Never Smoker  . Smokeless tobacco: Never Used  Substance Use Topics  . Alcohol use: No     Subjective:   Meredith Phillips is  here today requesting evaluation of right shoulder and vaginal pain, due to her memory problems is it somewhat difficult to get a clear picture of her complaints The right shoulder pain began about 2 days ago , first noticed when waking in the morning, thought she slept wrong, she describes as aching pulling pain with movement or touch, she tried heat which did not help. She denies any fevers, weakness, neck pain, cp, sob, numbness, tingling or falls. Her vaginal pain is not a new problem. She tells me for years now she has noticed painful bumps in her groin when she shaves. She actually says she does not have any of the bumps right now, and they have not occurred since the last time she shaved last year. She was wondering if she could have some cream to rub on her bumps if they come back. She denies abdominal or pelvic pain, vaginal discharge or bleeding, dysuria, hematuria, urinary frequency  ROS- See HPI  Objective:  Vitals:   06/18/18 1615 06/18/18 1643  BP: (!) 160/98 (!) 144/90  Pulse: 74   Temp: 98.2 F (36.8 C)   TempSrc: Oral   SpO2: 98%   Weight: 161 lb (73 kg)   Height: 5' 3" (1.6 m)     General: Well developed, well nourished, in no acute distress  Skin : Warm and dry.  Head: Normocephalic and atraumatic  Eyes: Sclera and conjunctiva clear; pupils round and reactive to light; extraocular movements intact  Oropharynx: Pink, supple. No suspicious lesions  Neck: Supple Lungs: Respirations unlabored; clear to auscultation bilaterally without wheeze, rales, rhonchi  CVS exam: normal rate, regular rhythm, normal S1, S2, no murmurs, rubs, clicks or gallops.  Musculoskeletal: Right shoulder: She exhibits tenderness. She exhibits no bony tenderness, no swelling, no deformity and normal strength.  Extremities: No edema, cyanosis, clubbing  Vessels: Symmetric bilaterally  Neurologic: Alert and oriented; speech intact; face symmetrical; moves all extremities well; CNII-XII intact without  focal deficit  Psychiatric: Normal mood and affect.   Assessment:  1. Acute pain of right shoulder   2. Vaginal pain     Plan:   1. Vaginal pain Due to not currently having symptoms, no treatment prescribed today We discussed avoiding shaving to prevent painful bumps She will follow up for new, worsening, recurrent symptoms  2. Acute pain of right shoulder Will try antiinflammatory course- medication dosing and side effects discussed Home management, red flags and return precautions including when to seek immediate care discussed and printed on AVS She was instructed to f/u for new, worsening pain or if pain persists for 1 week  No follow-ups on file.  No orders of the defined types were placed in this encounter.   Requested Prescriptions   Signed  Prescriptions Disp Refills  . meloxicam (MOBIC) 7.5 MG tablet 30 tablet 0    Sig: Take 1 tablet (7.5 mg total) by mouth daily.

## 2018-06-22 ENCOUNTER — Telehealth: Payer: Self-pay | Admitting: *Deleted

## 2018-06-22 NOTE — Telephone Encounter (Signed)
Called patient's husband, on DPR and informed him that patient's MRI brain result was unremarkable, no major findings. This RN did inform him she has some sinusitis, and if she has symptoms she may need to be checked by PCP. Reviewed Dr Richrd Humbles plan per last office note and advised family come with her to appointments. He verbalized understanding, appreciation.

## 2018-06-23 ENCOUNTER — Ambulatory Visit: Payer: Self-pay | Admitting: Family

## 2018-06-24 ENCOUNTER — Ambulatory Visit (INDEPENDENT_AMBULATORY_CARE_PROVIDER_SITE_OTHER): Payer: PPO | Admitting: Family Medicine

## 2018-06-24 ENCOUNTER — Encounter: Payer: Self-pay | Admitting: Family Medicine

## 2018-06-24 VITALS — BP 138/80 | HR 74 | Temp 97.9°F | Ht 63.0 in | Wt 161.1 lb

## 2018-06-24 DIAGNOSIS — E119 Type 2 diabetes mellitus without complications: Secondary | ICD-10-CM

## 2018-06-24 LAB — POCT GLYCOSYLATED HEMOGLOBIN (HGB A1C): HEMOGLOBIN A1C: 6 % — AB (ref 4.0–5.6)

## 2018-06-24 NOTE — Progress Notes (Signed)
Subjective:    Patient ID: Meredith Phillips, female    DOB: [birth year], 73 y.o.   MRN: [id number]  HPI  Meredith Phillips is a 73 year old female who presents today for a single elevated blood sugar reading at home.  She states that she has not been checking her blood sugar regularly at home but decided to check her blood sugar and stated her reading was 230 one week ago. She states the machine was new and she is not certain if she was using it correctly and she has not obtained another reading since that time.  She is taking Jardiance daily. She denies polyuria, polyphagia, and polydipsia. Reports feeling well overall but is concerned about the elevated reading   She reports that she been drinking more orange juice and is concerned this might be contributing to elevated blood sugar reading.       Lab Results  Component Value Date   HGBA1C 6.5 03/10/2018   Review of Systems  Constitutional: Negative for chills, fatigue and fever.  Respiratory: Negative for cough, shortness of breath and wheezing.   Cardiovascular: Negative for chest pain and palpitations.  Gastrointestinal: Negative for abdominal pain.  Endocrine: Negative for polydipsia, polyphagia and polyuria.  Genitourinary: Negative for dysuria.  Skin: Negative for rash.  Neurological: Negative for dizziness, weakness, light-headedness and headaches.  Psychiatric/Behavioral:       Anxious about blood sugar       Past Medical History:  Diagnosis Date  . Arthritis   . Benign essential HTN 02/18/2015  . Cellulitis 02/18/2015  . Chicken pox   . Chronic fatigue   . Dementia ([place])   . Depression    since last 04/2018 daughter passed.    . Diabetes mellitus without complication ([place])   . Diabetic peripheral neuropathy ([place])   . GERD (gastroesophageal reflux disease)   . Hypercholesteremia   . Hypertension   . Liver disease   . Polyp of colon      Social History   Socioeconomic History  . Marital status: Married    Spouse  name: Not on file  . Number of children: 5  . Years of education: 43  . Highest education level: Not on file  Occupational History  . Occupation: Retired  Scientific laboratory technician  . Financial resource strain: Not on file  . Food insecurity:    Worry: Not on file    Inability: Not on file  . Transportation needs:    Medical: Not on file    Non-medical: Not on file  Tobacco Use  . Smoking status: Never Smoker  . Smokeless tobacco: Never Used  Substance and Sexual Activity  . Alcohol use: No  . Drug use: No  . Sexual activity: Not on file  Lifestyle  . Physical activity:    Days per week: Not on file    Minutes per session: Not on file  . Stress: Not on file  Relationships  . Social connections:    Talks on phone: Not on file    Gets together: Not on file    Attends religious service: Not on file    Active member of club or organization: Not on file    Attends meetings of clubs or organizations: Not on file    Relationship status: Not on file  . Intimate partner violence:    Fear of current or ex partner: Not on file    Emotionally abused: Not on file    Physically abused: Not on file  Forced sexual activity: Not on file  Other Topics Concern  . Not on file  Social History Narrative   Fun: paint, word search   Feels safe where she is living and no concern for abuse   Denies religious beliefs effecting healthcare.     Past Surgical History:  Procedure Laterality Date  . APPENDECTOMY    . HERNIA REPAIR    . KNEE SURGERY    . ROTATOR CUFF REPAIR    . TONSILLECTOMY      Family History  Problem Relation Age of Onset  . Healthy Mother   . Healthy Father   . Healthy Paternal Grandmother   . Healthy Paternal Grandfather     Allergies  Allergen Reactions  . Codeine Other (See Comments)    Woke up the next morning and did not know where she was  . Other     Doesn't like to take pain medication due to hallucinations.  . Shellfish Allergy     Current Outpatient  Medications on File Prior to Visit  Medication Sig Dispense Refill  . amLODipine (NORVASC) 10 MG tablet Take 10 mg by mouth daily.  1  . aspirin EC 81 MG tablet Take 81 mg by mouth daily.    . Blood Glucose Monitoring Suppl (ONE TOUCH ULTRA 2) w/Device KIT Use to check blood sugars twice a day Dx E11.9 1 each 0  . Cholecalciferol (VITAMIN D-3) 1000 UNITS CAPS Take 1,000 Units by mouth.    . cloNIDine (CATAPRES) 0.1 MG tablet Take 1 tablet (0.1 mg total) by mouth 2 (two) times daily. 60 tablet 1  . esomeprazole (NEXIUM) 40 MG capsule TAKE 1 CAPSULE (40 MG TOTAL) BY MOUTH DAILY AT 12 NOON. 90 capsule 1  . fluticasone (FLONASE) 50 MCG/ACT nasal spray Place 2 sprays into both nostrils daily. (Patient taking differently: Place 2 sprays into both nostrils as needed for allergies. ) 16 g 6  . gabapentin (NEURONTIN) 300 MG capsule Take 1 capsule (300 mg total) by mouth 3 (three) times daily. TAKE 1 CAPSULE BY MOUTH THREE TIMES A DAY 270 capsule 0  . glucose blood (ONE TOUCH ULTRA TEST) test strip USE TO CHECK BLOOD SUGARS TWICE A DAY DX E11.9 100 each 1  . JARDIANCE 25 MG TABS tablet TAKE 1 TABLET BY MOUTH EVERY DAY 30 tablet 1  . Lancets (ONETOUCH ULTRASOFT) lancets USE TO HELP CHECK BLOOD SUGARS TWICE A DAY E11.9 100 each 1  . meclizine (ANTIVERT) 25 MG tablet Take 0.5-1 tablets (12.5-25 mg total) by mouth 2 (two) times daily as needed for dizziness. 30 tablet 0  . meloxicam (MOBIC) 7.5 MG tablet Take 1 tablet (7.5 mg total) by mouth daily. 30 tablet 0  . simvastatin (ZOCOR) 20 MG tablet Take 1 tablet (20 mg total) by mouth daily. 90 tablet 2   No current facility-administered medications on file prior to visit.     BP 138/80 (BP Location: Left Arm, Patient Position: Sitting, Cuff Size: Normal)   Pulse 74   Temp 97.9 F (36.6 C) (Oral)   Ht '5\' 3"'  (1.6 m)   Wt 161 lb 1.9 oz (73.1 kg)   SpO2 98%   BMI 28.54 kg/m    Objective:   Physical Exam Constitutional:      Appearance: Normal  appearance.  HENT:     Nose: Nose normal.     Mouth/Throat:     Mouth: Mucous membranes are moist.  Eyes:     General: No scleral icterus.  Pupils: Pupils are equal, round, and reactive to light.  Neck:     Musculoskeletal: Neck supple.  Cardiovascular:     Rate and Rhythm: Normal rate and regular rhythm.     Pulses: Normal pulses.     Heart sounds: Normal heart sounds.  Pulmonary:     Effort: Pulmonary effort is normal.     Breath sounds: Normal breath sounds.  Abdominal:     General: Abdomen is flat. Bowel sounds are normal.     Palpations: Abdomen is soft.  Skin:    General: Skin is warm and dry.     Capillary Refill: Capillary refill takes less than 2 seconds.  Neurological:     Mental Status: She is alert.  Psychiatric:        Mood and Affect: Mood normal.        Behavior: Behavior normal.        Thought Content: Thought content normal.        Judgment: Judgment normal.        Assessment & Plan:  1. Type 2 diabetes mellitus without complication, without long-term current use of insulin (HCC) POC A1C 6.0 today. Controlled. A1C has decreased since last visit. She will continue Jardiance daily, monitor blood sugar, keep a log and return for follow up with PCP in the next 3 months or sooner if needed. She will have a family member help her with obtaining blood sugar readings. We discussed sooner follow up if readings are elevated. Advised decreasing orange juice, sugary drinks, and carbohydrate. Reviewed signs and symptoms of hypo/hyperglycemia. Return precautions provided.  Delano Metz, FNP-C

## 2018-07-01 NOTE — Progress Notes (Signed)
I reviewed note and agree with plan.   [name] R. [name], MD 

## 2018-07-06 ENCOUNTER — Other Ambulatory Visit: Payer: Self-pay | Admitting: Nurse Practitioner

## 2018-07-06 DIAGNOSIS — E785 Hyperlipidemia, unspecified: Secondary | ICD-10-CM

## 2018-07-07 ENCOUNTER — Other Ambulatory Visit: Payer: Self-pay | Admitting: Nurse Practitioner

## 2018-07-07 ENCOUNTER — Encounter: Payer: Self-pay | Admitting: Family

## 2018-07-07 ENCOUNTER — Ambulatory Visit (INDEPENDENT_AMBULATORY_CARE_PROVIDER_SITE_OTHER): Payer: PPO | Admitting: Family

## 2018-07-07 ENCOUNTER — Other Ambulatory Visit: Payer: PPO

## 2018-07-07 VITALS — BP 132/82 | HR 66 | Temp 98.0°F | Ht 63.0 in | Wt 159.0 lb

## 2018-07-07 DIAGNOSIS — Z634 Disappearance and death of family member: Principal | ICD-10-CM

## 2018-07-07 DIAGNOSIS — M25511 Pain in right shoulder: Secondary | ICD-10-CM

## 2018-07-07 DIAGNOSIS — F4321 Adjustment disorder with depressed mood: Secondary | ICD-10-CM

## 2018-07-07 DIAGNOSIS — R300 Dysuria: Secondary | ICD-10-CM | POA: Diagnosis not present

## 2018-07-07 LAB — POC URINALSYSI DIPSTICK (AUTOMATED)
Bilirubin, UA: NEGATIVE
Blood, UA: NEGATIVE
Glucose, UA: POSITIVE — AB
Ketones, UA: NEGATIVE
LEUKOCYTES UA: NEGATIVE
Nitrite, UA: NEGATIVE
PROTEIN UA: POSITIVE — AB
Spec Grav, UA: 1.01 (ref 1.010–1.025)
Urobilinogen, UA: 0.2 E.U./dL
pH, UA: 6.5 (ref 5.0–8.0)

## 2018-07-07 MED ORDER — CEFUROXIME AXETIL 250 MG PO TABS: 250.0000 mg | ORAL_TABLET | Freq: Two times a day (BID) | ORAL | 0 refills | Status: DC

## 2018-07-07 NOTE — Progress Notes (Signed)
Meredith Phillips is a 73 y.o. female with the following history as recorded in EpicCare:  Patient Active Problem List   Diagnosis Date Noted  . Hyperlipidemia 03/26/2018  . Hyponatremia 03/26/2018  . Atypical chest pain 03/25/2018  . Urge incontinence 02/25/2018  . Glucosuria 02/25/2018  . Anxiety and depression 12/16/2017  . Memory loss 11/02/2017  . B12 deficiency 11/02/2017  . Vertigo 10/10/2017  . Trochanteric bursitis of left hip 10/10/2017  . Murmur, cardiac 04/15/2017  . Asymptomatic postmenopausal estrogen deficiency 04/15/2017  . Right-sided low back pain without sciatica 02/10/2017  . Grief at loss of child 07/29/2016  . Allergic rhinitis 02/27/2016  . GERD (gastroesophageal reflux disease) 08/06/2015  . Baker's cyst, ruptured 08/03/2015  . Type 2 diabetes mellitus ([place]) 06/29/2015  . Arthritis of left knee 06/15/2015  . Benign essential HTN 02/18/2015  . Cellulitis 02/18/2015  . Sciatica 01/17/2015  . Routine general medical examination at a health care facility 12/26/2014  . Medicare annual wellness visit, subsequent 12/26/2014  . Generalized osteoarthritis of multiple sites 11/25/2014  . GERD with esophagitis 11/25/2014  . Hip arthrosis 11/25/2014  . Diarrhea in adult patient 11/29/2013    Current Outpatient Medications  Medication Sig Dispense Refill  . amLODipine (NORVASC) 10 MG tablet Take 10 mg by mouth daily.  1  . aspirin EC 81 MG tablet Take 81 mg by mouth daily.    . Blood Glucose Monitoring Suppl (ONE TOUCH ULTRA 2) w/Device KIT Use to check blood sugars twice a day Dx E11.9 1 each 0  . Cholecalciferol (VITAMIN D-3) 1000 UNITS CAPS Take 1,000 Units by mouth.    . cloNIDine (CATAPRES) 0.1 MG tablet Take 1 tablet (0.1 mg total) by mouth 2 (two) times daily. 60 tablet 1  . esomeprazole (NEXIUM) 40 MG capsule TAKE 1 CAPSULE (40 MG TOTAL) BY MOUTH DAILY AT 12 NOON. 90 capsule 1  . gabapentin (NEURONTIN) 300 MG capsule Take 1 capsule (300 mg total) by mouth 3  (three) times daily. TAKE 1 CAPSULE BY MOUTH THREE TIMES A DAY 270 capsule 0  . glucose blood (ONE TOUCH ULTRA TEST) test strip USE TO CHECK BLOOD SUGARS TWICE A DAY DX E11.9 100 each 1  . JARDIANCE 25 MG TABS tablet TAKE 1 TABLET BY MOUTH EVERY DAY 30 tablet 1  . Lancets (ONETOUCH ULTRASOFT) lancets USE TO HELP CHECK BLOOD SUGARS TWICE A DAY E11.9 100 each 1  . meclizine (ANTIVERT) 25 MG tablet Take 0.5-1 tablets (12.5-25 mg total) by mouth 2 (two) times daily as needed for dizziness. 30 tablet 0  . meloxicam (MOBIC) 7.5 MG tablet Take 1 tablet (7.5 mg total) by mouth daily. 30 tablet 0  . simvastatin (ZOCOR) 20 MG tablet TAKE 1 TABLET BY MOUTH EVERY DAY 90 tablet 1  . cefUROXime (CEFTIN) 250 MG tablet Take 1 tablet (250 mg total) by mouth 2 (two) times daily with a meal. 10 tablet 0  . fluticasone (FLONASE) 50 MCG/ACT nasal spray Place 2 sprays into both nostrils daily. (Patient not taking: Reported on 07/07/2018) 16 g 6   No current facility-administered medications for this visit.     Allergies: Codeine; Other; and Shellfish allergy  Past Medical History:  Diagnosis Date  . Arthritis   . Benign essential HTN 02/18/2015  . Cellulitis 02/18/2015  . Chicken pox   . Chronic fatigue   . Dementia ([place])   . Depression    since last 04/2018 daughter passed.    . Diabetes mellitus without complication (  [place])   . Diabetic peripheral neuropathy ([place])   . GERD (gastroesophageal reflux disease)   . Hypercholesteremia   . Hypertension   . Liver disease   . Polyp of colon     Past Surgical History:  Procedure Laterality Date  . APPENDECTOMY    . HERNIA REPAIR    . KNEE SURGERY    . ROTATOR CUFF REPAIR    . TONSILLECTOMY      Family History  Problem Relation Age of Onset  . Healthy Mother   . Healthy Father   . Healthy Paternal Grandmother   . Healthy Paternal Grandfather     Social History   Tobacco Use  . Smoking status: Never Smoker  . Smokeless tobacco: Never Used  Substance Use  Topics  . Alcohol use: No    Subjective:  Patient presents with concerns for UTI; complaining of burning on urination/ frequency; no blood in urine; no fever; does take Jardiance for her diabetes- has been on this for a while with no problems; no previous history of UTIs recently; last Hgba1c very good at 6.6;     Objective:  Vitals:   07/07/18 1058  BP: 132/82  Pulse: 66  Temp: 98 F (36.7 C)  TempSrc: Oral  SpO2: 99%  Weight: 159 lb 0.6 oz (72.1 kg)  Height: '5\' 3"'  (1.6 m)    General: Well developed, well nourished, in no acute distress  Skin : Warm and dry.  Head: Normocephalic and atraumatic  Lungs: Respirations unlabored; clear to auscultation bilaterally without wheeze, rales, rhonchi  CVS exam: normal rate and regular rhythm.  Neurologic: Alert and oriented; speech intact; face symmetrical; moves all extremities well; CNII-XII intact without focal deficit   Assessment:  1. Dysuria     Plan:  Update U/a and urine culture; Rx for Ceftin 250 mg bid x 5 days; if symptoms persist, may need to consider changing Jardiance; follow-up to be determined.  No follow-ups on file.  Orders Placed This Encounter  Procedures  . Urine Culture    Standing Status:   Future    Standing Expiration Date:   07/07/2019  . POCT Urinalysis Dipstick (Automated)    Requested Prescriptions   Signed Prescriptions Disp Refills  . cefUROXime (CEFTIN) 250 MG tablet 10 tablet 0    Sig: Take 1 tablet (250 mg total) by mouth 2 (two) times daily with a meal.

## 2018-07-08 LAB — URINE CULTURE
MICRO NUMBER:: 88959
Result:: NO GROWTH
SPECIMEN QUALITY:: ADEQUATE

## 2018-07-08 NOTE — Telephone Encounter (Signed)
Ok to Rf Sertraline? It looks like it was D/C'd at 12/16/17 OV.

## 2018-07-11 ENCOUNTER — Other Ambulatory Visit: Payer: Self-pay | Admitting: Family

## 2018-07-11 ENCOUNTER — Other Ambulatory Visit: Payer: Self-pay | Admitting: Nurse Practitioner

## 2018-07-11 DIAGNOSIS — I10 Essential (primary) hypertension: Secondary | ICD-10-CM

## 2018-07-12 MED ORDER — EMPAGLIFLOZIN 25 MG PO TABS: 25.0000 mg | ORAL_TABLET | Freq: Every day | ORAL | 3 refills | Status: DC

## 2018-07-12 NOTE — Telephone Encounter (Signed)
Jardiance 25 mg is not preferred per patient's plan. Carvedilol was d/c'd by you? Please advise.

## 2018-07-15 ENCOUNTER — Telehealth: Payer: Self-pay

## 2018-07-15 NOTE — Telephone Encounter (Signed)
Copied from CRM [id number]. Topic: General - Inquiry >> Jul 15, 2018  8:49 AM Lynne Logan D wrote: Reason for CRM: Pt state she has not heard results of her urine culture. Please contact pt with results. [phone] Okay to leave vm.

## 2018-07-15 NOTE — Telephone Encounter (Signed)
I called and left message for patient today no UTI noted and to follow up with Ashleigh if her symptoms continue regarding switching the Jardiance.

## 2018-07-16 ENCOUNTER — Encounter: Payer: Self-pay | Admitting: Nurse Practitioner

## 2018-07-16 ENCOUNTER — Other Ambulatory Visit: Payer: PPO

## 2018-07-16 ENCOUNTER — Ambulatory Visit (INDEPENDENT_AMBULATORY_CARE_PROVIDER_SITE_OTHER): Payer: PPO | Admitting: Nurse Practitioner

## 2018-07-16 VITALS — BP 140/88 | HR 77 | Temp 98.1°F | Ht 63.0 in | Wt 160.0 lb

## 2018-07-16 DIAGNOSIS — R300 Dysuria: Secondary | ICD-10-CM | POA: Diagnosis not present

## 2018-07-16 DIAGNOSIS — E119 Type 2 diabetes mellitus without complications: Secondary | ICD-10-CM

## 2018-07-16 LAB — POCT URINALYSIS DIPSTICK
Bilirubin, UA: NEGATIVE
Blood, UA: NEGATIVE
Glucose, UA: POSITIVE — AB
Ketones, UA: NEGATIVE
Leukocytes, UA: NEGATIVE
Nitrite, UA: NEGATIVE
Protein, UA: POSITIVE — AB
Spec Grav, UA: 1.015 (ref 1.010–1.025)
Urobilinogen, UA: 0.2 E.U./dL
pH, UA: 6 (ref 5.0–8.0)

## 2018-07-16 MED ORDER — SITAGLIPTIN PHOSPHATE 100 MG PO TABS: 100.0000 mg | ORAL_TABLET | Freq: Every day | ORAL | 3 refills | Status: DC

## 2018-07-16 MED ORDER — TERCONAZOLE 0.8 % VA CREA: 1.0000 | TOPICAL_CREAM | Freq: Every day | VAGINAL | 0 refills | Status: DC

## 2018-07-16 NOTE — Assessment & Plan Note (Signed)
Due to continued urinary complaints without infection, will try switching jardiance to [place] to see if urinary symptoms improve-medication dosing/side effects discussed RTC in 3 months for f/u- recheck A1c

## 2018-07-16 NOTE — Progress Notes (Signed)
Meredith Phillips is a 73 y.o. female with the following history as recorded in EpicCare:  Patient Active Problem List   Diagnosis Date Noted  . Hyperlipidemia 03/26/2018  . Hyponatremia 03/26/2018  . Atypical chest pain 03/25/2018  . Urge incontinence 02/25/2018  . Glucosuria 02/25/2018  . Anxiety and depression 12/16/2017  . Memory loss 11/02/2017  . B12 deficiency 11/02/2017  . Vertigo 10/10/2017  . Trochanteric bursitis of left hip 10/10/2017  . Murmur, cardiac 04/15/2017  . Asymptomatic postmenopausal estrogen deficiency 04/15/2017  . Right-sided low back pain without sciatica 02/10/2017  . Grief at loss of child 07/29/2016  . Allergic rhinitis 02/27/2016  . GERD (gastroesophageal reflux disease) 08/06/2015  . Baker's cyst, ruptured 08/03/2015  . Type 2 diabetes mellitus ([place]) 06/29/2015  . Arthritis of left knee 06/15/2015  . Benign essential HTN 02/18/2015  . Cellulitis 02/18/2015  . Sciatica 01/17/2015  . Routine general medical examination at a health care facility 12/26/2014  . Medicare annual wellness visit, subsequent 12/26/2014  . Generalized osteoarthritis of multiple sites 11/25/2014  . GERD with esophagitis 11/25/2014  . Hip arthrosis 11/25/2014  . Diarrhea in adult patient 11/29/2013    Current Outpatient Medications  Medication Sig Dispense Refill  . amLODipine (NORVASC) 10 MG tablet Take 10 mg by mouth daily.  1  . aspirin EC 81 MG tablet Take 81 mg by mouth daily.    . Blood Glucose Monitoring Suppl (ONE TOUCH ULTRA 2) w/Device KIT Use to check blood sugars twice a day Dx E11.9 1 each 0  . Cholecalciferol (VITAMIN D-3) 1000 UNITS CAPS Take 1,000 Units by mouth.    . cloNIDine (CATAPRES) 0.1 MG tablet Take 1 tablet (0.1 mg total) by mouth 2 (two) times daily. 60 tablet 1  . esomeprazole (NEXIUM) 40 MG capsule TAKE 1 CAPSULE (40 MG TOTAL) BY MOUTH DAILY AT 12 NOON. 90 capsule 1  . fluticasone (FLONASE) 50 MCG/ACT nasal spray Place 2 sprays into both nostrils  daily. 16 g 6  . gabapentin (NEURONTIN) 300 MG capsule Take 1 capsule (300 mg total) by mouth 3 (three) times daily. TAKE 1 CAPSULE BY MOUTH THREE TIMES A DAY 270 capsule 0  . glucose blood (ONE TOUCH ULTRA TEST) test strip USE TO CHECK BLOOD SUGARS TWICE A DAY DX E11.9 100 each 1  . Lancets (ONETOUCH ULTRASOFT) lancets USE TO HELP CHECK BLOOD SUGARS TWICE A DAY E11.9 100 each 1  . meclizine (ANTIVERT) 25 MG tablet Take 0.5-1 tablets (12.5-25 mg total) by mouth 2 (two) times daily as needed for dizziness. 30 tablet 0  . meloxicam (MOBIC) 7.5 MG tablet TAKE 1 TABLET BY MOUTH EVERY DAY 30 tablet 0  . simvastatin (ZOCOR) 20 MG tablet TAKE 1 TABLET BY MOUTH EVERY DAY 90 tablet 1  . sitaGLIPtin (JANUVIA) 100 MG tablet Take 1 tablet (100 mg total) by mouth daily. 30 tablet 3   No current facility-administered medications for this visit.     Allergies: Codeine; Other; and Shellfish allergy  Past Medical History:  Diagnosis Date  . Arthritis   . Benign essential HTN 02/18/2015  . Cellulitis 02/18/2015  . Chicken pox   . Chronic fatigue   . Dementia ([place])   . Depression    since last 04/2018 daughter passed.    . Diabetes mellitus without complication ([place])   . Diabetic peripheral neuropathy ([place])   . GERD (gastroesophageal reflux disease)   . Hypercholesteremia   . Hypertension   . Liver disease   .  Polyp of colon     Past Surgical History:  Procedure Laterality Date  . APPENDECTOMY    . HERNIA REPAIR    . KNEE SURGERY    . ROTATOR CUFF REPAIR    . TONSILLECTOMY      Family History  Problem Relation Age of Onset  . Healthy Mother   . Healthy Father   . Healthy Paternal Grandmother   . Healthy Paternal Grandfather     Social History   Tobacco Use  . Smoking status: Never Smoker  . Smokeless tobacco: Never Used  Substance Use Topics  . Alcohol use: No     Subjective:  Meredith Phillips is here today with complaint of urinary burning/frequency She was seen on 07/07/18 for similar  symptoms, treated with ceftin course, which she completed, but says the symptoms never improved. Of note, she is on jardiance 25 daily for diabetes management, and has been seen several times over past year for urinary complaints, a urology referral was done on 02/25/18 but she never scheduled a visit  She was on metformin before Jardiance but made her have diarrhea Denies fevers, chills, decreased appetite, nausea, vomiting, vaginal discharge or bleeding.  Lab Results  Component Value Date   HGBA1C 6.0 (A) 06/24/2018    ROS- See HPI  Objective:  Vitals:   07/16/18 1016 07/16/18 1034  BP: (!) 158/100 140/88  Pulse: 77   Temp: 98.1 F (36.7 C)   TempSrc: Oral   SpO2: 98%   Weight: 160 lb (72.6 kg)   Height: '5\' 3"'  (1.6 m)     General: Well developed, well nourished, in no acute distress  Skin : Warm and dry.  Head: Normocephalic and atraumatic  Eyes: Sclera and conjunctiva clear; pupils round and reactive to light; extraocular movements intact  Oropharynx: Pink, supple. No suspicious lesions  Neck: Supple Lungs: Effort unlabored, no respiratory distress CVS exam: normal rate and regular rhythm.  Abdomen: Soft; nontender; nondistended; no masses or hepatosplenomegaly; no CVA tenderness Extremities: No edema, cyanosis, clubbing  Vessels: Symmetric bilaterally  Neurologic: Alert and oriented; speech intact; face symmetrical; moves all extremities well; CNII-XII intact without focal deficit  Psychiatric: Normal mood and affect.  Assessment:  1. Dysuria   2. Type 2 diabetes mellitus without complication, without long-term current use of insulin (HCC)     Plan:   POCT urinalysis today does not indicate infection, will send culture  Due to continued urinary complaints without infection, will try switching jardiance to [place] to see if urinary symptoms improve-medication dosing/side effects discussed She also told me at the end of visit that her "vagina was irritated"- will send  terazol course as well Home management, red flags and return precautions including when to seek immediate care discussed and printed on AVS RTC in 3 months, recheck A1c    Return in about 3 months (around 10/14/2018) for F/U: switching januvia to jardiance, recheck A1c, urinary symptoms.  Orders Placed This Encounter  Procedures  . Urine Culture    Standing Status:   Future    Standing Expiration Date:   08/14/2018  . POCT urinalysis dipstick    Requested Prescriptions   Signed Prescriptions Disp Refills  . sitaGLIPtin (JANUVIA) 100 MG tablet 30 tablet 3    Sig: Take 1 tablet (100 mg total) by mouth daily.

## 2018-07-16 NOTE — Patient Instructions (Addendum)
STOP jardiance  START januvia once daily.  Return in about 3 months for follow up, so we can recheck your A1c on the new medication   Urinary Frequency, Adult Urinary frequency means urinating more often than usual. You may urinate every 1-2 hours even though you drink a normal amount of fluid and do not have a bladder infection or condition. Although you urinate more often than normal, the total amount of urine produced in a day is normal. With urinary frequency, you may have an urgent need to urinate often. The stress and anxiety of needing to find a bathroom quickly can make this urge worse. This condition may go away on its own or you may need treatment at home. Home treatment may include bladder training, exercises, taking medicines, or making changes to your diet. Follow these instructions at home: Bladder health   Keep a bladder diary if told by your health care provider. Keep track of: ? What you eat and drink. ? How often you urinate. ? How much you urinate.  Follow a bladder training program if told by your health care provider. This may include: ? Learning to delay going to the bathroom. ? Double urinating (voiding). This helps if you are not completely emptying your bladder. ? Scheduled voiding.  Do Kegel exercises as told by your health care provider. Kegel exercises strengthen the muscles that help control urination, which may help the condition. Eating and drinking  If told by your health care provider, make diet changes, such as: ? Avoiding caffeine. ? Drinking fewer fluids, especially alcohol. ? Not drinking in the evening. ? Avoiding foods or drinks that may irritate the bladder. These include coffee, tea, soda, artificial sweeteners, citrus, tomato-based foods, and chocolate. ? Eating foods that help prevent or ease constipation. Constipation can make this condition worse. Your health care provider may recommend that you:  Drink enough fluid to keep your urine  pale yellow.  Take over-the-counter or prescription medicines.  Eat foods that are high in fiber, such as beans, whole grains, and fresh fruits and vegetables.  Limit foods that are high in fat and processed sugars, such as fried or sweet foods. General instructions  Take over-the-counter and prescription medicines only as told by your health care provider.  Keep all follow-up visits as told by your health care provider. This is important. Contact a health care provider if:  You start urinating more often.  You feel pain or irritation when you urinate.  You notice blood in your urine.  Your urine looks cloudy.  You develop a fever.  You begin vomiting. Get help right away if:  You are unable to urinate. Summary  Urinary frequency means urinating more often than usual. With urinary frequency, you may urinate every 1-2 hours even though you drink a normal amount of fluid and do not have a bladder infection or other bladder condition.  Your health care provider may recommend that you keep a bladder diary, follow a bladder training program, or make dietary changes.  If told by your health care provider, do Kegel exercises to strengthen the muscles that help control urination.  Take over-the-counter and prescription medicines only as told by your health care provider.  Contact a health care provider if your symptoms do not improve or get worse. This information is not intended to replace advice given to you by your health care provider. Make sure you discuss any questions you have with your health care provider. Document Released: 03/29/2009 Document Revised: 12/10/2017  Document Reviewed: 12/10/2017 Elsevier Interactive Patient Education  [employer].

## 2018-07-17 LAB — URINE CULTURE
MICRO NUMBER:: 134360
SPECIMEN QUALITY:: ADEQUATE

## 2018-07-28 ENCOUNTER — Other Ambulatory Visit: Payer: Self-pay | Admitting: *Deleted

## 2018-07-28 DIAGNOSIS — M25511 Pain in right shoulder: Secondary | ICD-10-CM

## 2018-07-28 MED ORDER — MELOXICAM 7.5 MG PO TABS: 7.5000 mg | ORAL_TABLET | Freq: Every day | ORAL | 0 refills | Status: DC

## 2018-08-06 ENCOUNTER — Other Ambulatory Visit: Payer: Self-pay | Admitting: *Deleted

## 2018-08-06 MED ORDER — AMLODIPINE BESYLATE 10 MG PO TABS: 10.0000 mg | ORAL_TABLET | Freq: Every day | ORAL | 0 refills | Status: DC

## 2018-08-17 ENCOUNTER — Ambulatory Visit: Payer: Self-pay

## 2018-08-17 NOTE — Telephone Encounter (Signed)
Returned call to pt.  Stated she fell on Sunday, 3/1; tripped on an area rug and landed on a wood floor.  Reported she fell on the right side, and injured shoulder and hip, and also bumped her left knee.  Today, reported she has a burning sensation in the right shoulder, discomfort in right hip and left knee.  Stated there is "some swelling in the right shoulder, but denied any bruising.  Rated pain in right shoulder at 4/10.  Stated she has some discomfort in the right hip and left knee, but able to bear weight without noting increased pain.  Denied swelling or bruising of the right hip and left knee.  appt. Scheduled 08/17/18 with PCP.  Care advice given per protocol.  Verb. Understanding.         Reason for Disposition . [1] MODERATE pain (e.g., interferes with normal activities) AND [2] present > 3 days  Answer Assessment - Initial Assessment Questions 1. ONSET: "When did the pain start?"     On 08/15/18 when she fell on right shoulder 2. LOCATION: "Where is the pain located?"     Right shoulder  3. PAIN: "How bad is the pain?" (Scale 1-10; or mild, moderate, severe)   - MILD (1-3): doesn't interfere with normal activities   - MODERATE (4-7): interferes with normal activities (e.g., work or school) or awakens from sleep   - SEVERE (8-10): excruciating pain, unable to do any normal activities, unable to move arm at all due to pain     4/10 4. WORK OR EXERCISE: "Has there been any recent work or exercise that involved this part of the body?"     no 5. CAUSE: "What do you think is causing the shoulder pain?"     Tripped on area rug, and fell on wood floor on 08/15/18 6. OTHER SYMPTOMS: "Do you have any other symptoms?" (e.g., neck pain, swelling, rash, fever, numbness, weakness)     A little swelling of right shoulder; painful to move certain positions; c/o right hip pain-denied bruising/ swelling, pain in left knee where the knee hit the floor     7. PREGNANCY: "Is there any chance you are  pregnant?" "When was your last menstrual period?"     N/a  Protocols used: SHOULDER PAIN-A-AH Message from Areatha Keas sent at 08/17/2018 9:44 AM EST   Summary: advise    Pt stated she fell on Sunday and landed on her right shoulder and left knee(which is bone on bone) the right shoulder has Pain and stinging. Pt wants to know if she needs to be seen or if she can continue putting on the roll on medication. Pt knee is not in as much pain as she thinks it should be in. Pt is worrying about the shoulder stinging and burning since it wasn't doing that before. Pt also states she has been getting tired/sleepy all the time and wants to know if she can take some geritol. / please advise

## 2018-08-18 ENCOUNTER — Ambulatory Visit (INDEPENDENT_AMBULATORY_CARE_PROVIDER_SITE_OTHER)
Admission: RE | Admit: 2018-08-18 | Discharge: 2018-08-18 | Disposition: A | Discharge status: Home / Self Care | Payer: PPO | Source: Ambulatory Visit | Attending: Nurse Practitioner | Admitting: Nurse Practitioner

## 2018-08-18 ENCOUNTER — Ambulatory Visit (INDEPENDENT_AMBULATORY_CARE_PROVIDER_SITE_OTHER): Payer: PPO | Admitting: Nurse Practitioner

## 2018-08-18 ENCOUNTER — Telehealth: Payer: Self-pay | Admitting: Nurse Practitioner

## 2018-08-18 ENCOUNTER — Encounter: Payer: Self-pay | Admitting: Nurse Practitioner

## 2018-08-18 ENCOUNTER — Other Ambulatory Visit: Payer: Self-pay | Admitting: Nurse Practitioner

## 2018-08-18 VITALS — BP 122/78 | HR 72 | Ht 63.0 in | Wt 158.0 lb

## 2018-08-18 DIAGNOSIS — M25551 Pain in right hip: Secondary | ICD-10-CM

## 2018-08-18 DIAGNOSIS — W19XXXA Unspecified fall, initial encounter: Secondary | ICD-10-CM

## 2018-08-18 DIAGNOSIS — M25511 Pain in right shoulder: Secondary | ICD-10-CM

## 2018-08-18 DIAGNOSIS — S79911A Unspecified injury of right hip, initial encounter: Secondary | ICD-10-CM | POA: Diagnosis not present

## 2018-08-18 DIAGNOSIS — S4991XA Unspecified injury of right shoulder and upper arm, initial encounter: Secondary | ICD-10-CM | POA: Diagnosis not present

## 2018-08-18 NOTE — Progress Notes (Signed)
Meredith Phillips is a 73 y.o. female with the following history as recorded in EpicCare:  Patient Active Problem List   Diagnosis Date Noted  . Hyperlipidemia 03/26/2018  . Hyponatremia 03/26/2018  . Atypical chest pain 03/25/2018  . Urge incontinence 02/25/2018  . Glucosuria 02/25/2018  . Anxiety and depression 12/16/2017  . Memory loss 11/02/2017  . B12 deficiency 11/02/2017  . Vertigo 10/10/2017  . Trochanteric bursitis of left hip 10/10/2017  . Murmur, cardiac 04/15/2017  . Asymptomatic postmenopausal estrogen deficiency 04/15/2017  . Right-sided low back pain without sciatica 02/10/2017  . Grief at loss of child 07/29/2016  . Allergic rhinitis 02/27/2016  . GERD (gastroesophageal reflux disease) 08/06/2015  . Baker's cyst, ruptured 08/03/2015  . Type 2 diabetes mellitus ([place]) 06/29/2015  . Arthritis of left knee 06/15/2015  . Benign essential HTN 02/18/2015  . Cellulitis 02/18/2015  . Sciatica 01/17/2015  . Routine general medical examination at a health care facility 12/26/2014  . Medicare annual wellness visit, subsequent 12/26/2014  . Generalized osteoarthritis of multiple sites 11/25/2014  . GERD with esophagitis 11/25/2014  . Hip arthrosis 11/25/2014  . Diarrhea in adult patient 11/29/2013    Current Outpatient Medications  Medication Sig Dispense Refill  . amLODipine (NORVASC) 10 MG tablet Take 1 tablet (10 mg total) by mouth daily. 90 tablet 0  . aspirin EC 81 MG tablet Take 81 mg by mouth daily.    . Blood Glucose Monitoring Suppl (ONE TOUCH ULTRA 2) w/Device KIT Use to check blood sugars twice a day Dx E11.9 1 each 0  . Cholecalciferol (VITAMIN D-3) 1000 UNITS CAPS Take 1,000 Units by mouth.    . cloNIDine (CATAPRES) 0.1 MG tablet Take 1 tablet (0.1 mg total) by mouth 2 (two) times daily. 60 tablet 1  . cyanocobalamin 1000 MCG tablet Take 1,000 mcg by mouth daily.    [name] esomeprazole (NEXIUM) 40 MG capsule TAKE 1 CAPSULE (40 MG TOTAL) BY MOUTH DAILY AT 12 NOON. 90  capsule 1  . fluticasone (FLONASE) 50 MCG/ACT nasal spray Place 2 sprays into both nostrils daily. 16 g 6  . gabapentin (NEURONTIN) 300 MG capsule Take 1 capsule (300 mg total) by mouth 3 (three) times daily. TAKE 1 CAPSULE BY MOUTH THREE TIMES A DAY 270 capsule 0  . glucose blood (ONE TOUCH ULTRA TEST) test strip USE TO CHECK BLOOD SUGARS TWICE A DAY DX E11.9 100 each 1  . Lancets (ONETOUCH ULTRASOFT) lancets USE TO HELP CHECK BLOOD SUGARS TWICE A DAY E11.9 100 each 1  . meclizine (ANTIVERT) 25 MG tablet Take 0.5-1 tablets (12.5-25 mg total) by mouth 2 (two) times daily as needed for dizziness. 30 tablet 0  . meloxicam (MOBIC) 7.5 MG tablet Take 1 tablet (7.5 mg total) by mouth daily. 30 tablet 0  . Multiple Vitamin (MULTIVITAMIN) tablet Take 1 tablet by mouth daily.    . simvastatin (ZOCOR) 20 MG tablet TAKE 1 TABLET BY MOUTH EVERY DAY 90 tablet 1  . sitaGLIPtin (JANUVIA) 100 MG tablet Take 1 tablet (100 mg total) by mouth daily. 30 tablet 3  . terconazole (TERAZOL 3) 0.8 % vaginal cream Place 1 applicator vaginally at bedtime. 20 g 0   No current facility-administered medications for this visit.     Allergies: Codeine; Other; and Shellfish allergy  Past Medical History:  Diagnosis Date  . Arthritis   . Benign essential HTN 02/18/2015  . Cellulitis 02/18/2015  . Chicken pox   . Chronic fatigue   . Dementia (  [place])   . Depression    since last 04/2018 daughter passed.    . Diabetes mellitus without complication ([place])   . Diabetic peripheral neuropathy ([place])   . GERD (gastroesophageal reflux disease)   . Hypercholesteremia   . Hypertension   . Liver disease   . Polyp of colon     Past Surgical History:  Procedure Laterality Date  . APPENDECTOMY    . HERNIA REPAIR    . KNEE SURGERY    . ROTATOR CUFF REPAIR    . TONSILLECTOMY      Family History  Problem Relation Age of Onset  . Healthy Mother   . Healthy Father   . Healthy Paternal Grandmother   . Healthy Paternal Grandfather      Social History   Tobacco Use  . Smoking status: Never Smoker  . Smokeless tobacco: Never Used  Substance Use Topics  . Alcohol use: No     Subjective:  Meredith Phillips is here today for evaluation of acute problem, CC: fall. Accompanied by her daughter. She says she tripped on rug in living room 3 days ago, falling onto the right side of her body, and shes been having right shoulder and right hip pain since. She denies head injury, LOC, deformities, skin discoloration/bruising, cp, sob, neck pain, back pain, numbness or tingling. She has been taking tylenol and using heat/ice at home with some improvement in her pain. She had right shoulder surgery many years ago.  ROS- See HPI  Objective:  Vitals:   08/18/18 1309  BP: 122/78  Pulse: 72  SpO2: 97%  Weight: 158 lb (71.7 kg)  Height: _0  (1.6 m)    General: Well developed, well nourished, in no acute distress  Skin : Warm and dry. No bruising, erythema. Head: Normocephalic and atraumatic  Eyes: Sclera and conjunctiva clear; pupils round and reactive to light; extraocular movements intact  Oropharynx: Pink, supple. No suspicious lesions  Neck: Supple Lungs: Respirations unlabored; clear to auscultation bilaterally without wheeze, rales, rhonchi  CVS exam: normal rate and regular rhythm, S1 and S2 normal.  Musculoskeletal: No deformities; no active joint inflammation; normal ROM Extremities: No edema, cyanosis, clubbing  Vessels: Symmetric bilaterally  Neurologic: Alert and oriented; speech intact; face symmetrical; moves all extremities well; CNII-XII intact without focal deficit  Psychiatric: Normal mood and affect.  Assessment:  1. Fall, initial encounter   2. Pain of right hip joint   3. Acute pain of right shoulder     Plan:   Check imaging Home management, fall prevention, red flags and return precautions including when to seek immediate care discussed and printed on AVS F/U with further recommendations pending  xray results  No follow-ups on file.  Orders Placed This Encounter  Procedures  . DG Shoulder Right    Standing Status:   Future    Number of Occurrences:   1    Standing Expiration Date:   10/18/2019    Order Specific Question:   Reason for Exam (SYMPTOM  OR DIAGNOSIS REQUIRED)    Answer:   fall, pain    Order Specific Question:   Preferred imaging location?    Answer:   [facility]    Order Specific Question:   Radiology Contrast Protocol - do NOT remove file path    Answer:   [email or web address]    Requested Prescriptions    No prescriptions requested or ordered in this encounter

## 2018-08-18 NOTE — Telephone Encounter (Signed)
Okay with me 

## 2018-08-18 NOTE — Telephone Encounter (Signed)
Agree --- she needs to make appointment with edward and name can be changed once she is here

## 2018-08-18 NOTE — Telephone Encounter (Signed)
Pt would like to transfer to a physician at [facility], this location is closer to her home and if one of her family members cannot bring her to her appointment she could drive herself to this location.   Please advise on transfer on care

## 2018-08-18 NOTE — Patient Instructions (Signed)
Head downstairs for xrays, Ill let you know when the results are back   Fall Prevention in the Home, Adult Falls can cause injuries. They can happen to people of all ages. There are many things you can do to make your home safe and to help prevent falls. Ask for help when making these changes, if needed. What actions can I take to prevent falls? General Instructions  Use good lighting in all rooms. Replace any light bulbs that burn out.  Turn on the lights when you go into a dark area. Use night-lights.  Keep items that you use often in easy-to-reach places. Lower the shelves around your home if necessary.  Set up your furniture so you have a clear path. Avoid moving your furniture around.  Do not have throw rugs and other things on the floor that can make you trip.  Avoid walking on wet floors.  If any of your floors are uneven, fix them.  Add color or contrast paint or tape to clearly mark and help you see: ? Any grab bars or handrails. ? First and last steps of stairways. ? Where the edge of each step is.  If you use a stepladder: ? Make sure that it is fully opened. Do not climb a closed stepladder. ? Make sure that both sides of the stepladder are locked into place. ? Ask someone to hold the stepladder for you while you use it.  If there are any pets around you, be aware of where they are. What can I do in the bathroom?      Keep the floor dry. Clean up any water that spills onto the floor as soon as it happens.  Remove soap buildup in the tub or shower regularly.  Use non-skid mats or decals on the floor of the tub or shower.  Attach bath mats securely with double-sided, non-slip rug tape.  If you need to sit down in the shower, use a plastic, non-slip stool.  Install grab bars by the toilet and in the tub and shower. Do not use towel bars as grab bars. What can I do in the bedroom?  Make sure that you have a light by your bed that is easy to reach.  Do not  use any sheets or blankets that are too big for your bed. They should not hang down onto the floor.  Have a firm chair that has side arms. You can use this for support while you get dressed. What can I do in the kitchen?  Clean up any spills right away.  If you need to reach something above you, use a strong step stool that has a grab bar.  Keep electrical cords out of the way.  Do not use floor polish or wax that makes floors slippery. If you must use wax, use non-skid floor wax. What can I do with my stairs?  Do not leave any items on the stairs.  Make sure that you have a light switch at the top of the stairs and the bottom of the stairs. If you do not have them, ask someone to add them for you.  Make sure that there are handrails on both sides of the stairs, and use them. Fix handrails that are broken or loose. Make sure that handrails are as long as the stairways.  Install non-slip stair treads on all stairs in your home.  Avoid having throw rugs at the top or bottom of the stairs. If you do have throw  rugs, attach them to the floor with carpet tape.  Choose a carpet that does not hide the edge of the steps on the stairway.  Check any carpeting to make sure that it is firmly attached to the stairs. Fix any carpet that is loose or worn. What can I do on the outside of my home?  Use bright outdoor lighting.  Regularly fix the edges of walkways and driveways and fix any cracks.  Remove anything that might make you trip as you walk through a door, such as a raised step or threshold.  Trim any bushes or trees on the path to your home.  Regularly check to see if handrails are loose or broken. Make sure that both sides of any steps have handrails.  Install guardrails along the edges of any raised decks and porches.  Clear walking paths of anything that might make someone trip, such as tools or rocks.  Have any leaves, snow, or ice cleared regularly.  Use sand or salt on  walking paths during winter.  Clean up any spills in your garage right away. This includes grease or oil spills. What other actions can I take?  Wear shoes that: ? Have a low heel. Do not wear high heels. ? Have rubber bottoms. ? Are comfortable and fit you well. ? Are closed at the toe. Do not wear open-toe sandals.  Use tools that help you move around (mobility aids) if they are needed. These include: ? Canes. ? Walkers. ? Scooters. ? Crutches.  Review your medicines with your doctor. Some medicines can make you feel dizzy. This can increase your chance of falling. Ask your doctor what other things you can do to help prevent falls. Where to find more information  Centers for Disease Control and Prevention, STEADI: HealthcareCounselor.com.pt  [employer] on Aging: RingConnections.si Contact a doctor if:  You are afraid of falling at home.  You feel weak, drowsy, or dizzy at home.  You fall at home. Summary  There are many simple things that you can do to make your home safe and to help prevent falls.  Ways to make your home safe include removing tripping hazards and installing grab bars in the bathroom.  Ask for help when making these changes in your home. This information is not intended to replace advice given to you by your health care provider. Make sure you discuss any questions you have with your health care provider. Document Released: 03/29/2009 Document Revised: 01/15/2017 Document Reviewed: 01/15/2017 Elsevier Interactive Patient Education  2019 [employer].

## 2018-08-18 NOTE — Telephone Encounter (Signed)
Ok with me. But not to change to me until she actually comes in to see me.

## 2018-08-19 NOTE — Telephone Encounter (Signed)
appt made

## 2018-08-31 DIAGNOSIS — H2513 Age-related nuclear cataract, bilateral: Secondary | ICD-10-CM | POA: Diagnosis not present

## 2018-09-02 ENCOUNTER — Encounter: Payer: Self-pay | Admitting: Medical

## 2018-09-02 DIAGNOSIS — H348311 Tributary (branch) retinal vein occlusion, right eye, with retinal neovascularization: Secondary | ICD-10-CM | POA: Diagnosis not present

## 2018-09-02 DIAGNOSIS — H3561 Retinal hemorrhage, right eye: Secondary | ICD-10-CM | POA: Diagnosis not present

## 2018-09-02 DIAGNOSIS — E119 Type 2 diabetes mellitus without complications: Secondary | ICD-10-CM | POA: Diagnosis not present

## 2018-09-03 ENCOUNTER — Encounter: Payer: Self-pay | Admitting: *Deleted

## 2018-09-06 ENCOUNTER — Other Ambulatory Visit: Payer: Self-pay | Admitting: *Deleted

## 2018-09-06 DIAGNOSIS — M25511 Pain in right shoulder: Secondary | ICD-10-CM

## 2018-09-06 MED ORDER — MELOXICAM 7.5 MG PO TABS: 7.5000 mg | ORAL_TABLET | Freq: Every day | ORAL | 1 refills | Status: DC

## 2018-09-22 ENCOUNTER — Other Ambulatory Visit: Payer: Self-pay

## 2018-09-22 ENCOUNTER — Encounter: Payer: Self-pay | Admitting: Medical

## 2018-09-22 ENCOUNTER — Ambulatory Visit (INDEPENDENT_AMBULATORY_CARE_PROVIDER_SITE_OTHER): Payer: PPO | Admitting: Medical

## 2018-09-22 VITALS — BP 121/83 | HR 84

## 2018-09-22 DIAGNOSIS — Z23 Encounter for immunization: Secondary | ICD-10-CM | POA: Diagnosis not present

## 2018-09-22 DIAGNOSIS — H348311 Tributary (branch) retinal vein occlusion, right eye, with retinal neovascularization: Secondary | ICD-10-CM | POA: Diagnosis not present

## 2018-09-22 DIAGNOSIS — E785 Hyperlipidemia, unspecified: Secondary | ICD-10-CM | POA: Diagnosis not present

## 2018-09-22 DIAGNOSIS — Z888 Allergy status to other drugs, medicaments and biological substances status: Secondary | ICD-10-CM | POA: Diagnosis not present

## 2018-09-22 DIAGNOSIS — M2560 Stiffness of unspecified joint, not elsewhere classified: Secondary | ICD-10-CM | POA: Diagnosis not present

## 2018-09-22 DIAGNOSIS — N179 Acute kidney failure, unspecified: Secondary | ICD-10-CM | POA: Diagnosis not present

## 2018-09-22 DIAGNOSIS — Z885 Allergy status to narcotic agent status: Secondary | ICD-10-CM | POA: Diagnosis not present

## 2018-09-22 DIAGNOSIS — I10 Essential (primary) hypertension: Secondary | ICD-10-CM

## 2018-09-22 DIAGNOSIS — Z881 Allergy status to other antibiotic agents status: Secondary | ICD-10-CM | POA: Diagnosis not present

## 2018-09-22 DIAGNOSIS — I4891 Unspecified atrial fibrillation: Secondary | ICD-10-CM | POA: Diagnosis not present

## 2018-09-22 DIAGNOSIS — J449 Chronic obstructive pulmonary disease, unspecified: Secondary | ICD-10-CM | POA: Diagnosis not present

## 2018-09-22 DIAGNOSIS — Z8679 Personal history of other diseases of the circulatory system: Secondary | ICD-10-CM | POA: Diagnosis not present

## 2018-09-22 DIAGNOSIS — G20 Parkinson's disease: Secondary | ICD-10-CM | POA: Diagnosis not present

## 2018-09-22 DIAGNOSIS — G834 Cauda equina syndrome: Secondary | ICD-10-CM | POA: Diagnosis not present

## 2018-09-22 DIAGNOSIS — E1165 Type 2 diabetes mellitus with hyperglycemia: Secondary | ICD-10-CM | POA: Diagnosis not present

## 2018-09-22 DIAGNOSIS — N3001 Acute cystitis with hematuria: Secondary | ICD-10-CM | POA: Diagnosis not present

## 2018-09-22 DIAGNOSIS — E118 Type 2 diabetes mellitus with unspecified complications: Secondary | ICD-10-CM

## 2018-09-22 DIAGNOSIS — I959 Hypotension, unspecified: Secondary | ICD-10-CM | POA: Diagnosis not present

## 2018-09-22 DIAGNOSIS — K2100 Gastro-esophageal reflux disease with esophagitis, without bleeding: Secondary | ICD-10-CM

## 2018-09-22 DIAGNOSIS — I441 Atrioventricular block, second degree: Secondary | ICD-10-CM | POA: Diagnosis not present

## 2018-09-22 DIAGNOSIS — N529 Male erectile dysfunction, unspecified: Secondary | ICD-10-CM | POA: Diagnosis not present

## 2018-09-22 DIAGNOSIS — G9761 Postprocedural hematoma of a nervous system organ or structure following a nervous system procedure: Secondary | ICD-10-CM | POA: Diagnosis not present

## 2018-09-22 DIAGNOSIS — N401 Enlarged prostate with lower urinary tract symptoms: Secondary | ICD-10-CM | POA: Diagnosis not present

## 2018-09-22 DIAGNOSIS — Z111 Encounter for screening for respiratory tuberculosis: Secondary | ICD-10-CM | POA: Diagnosis not present

## 2018-09-22 DIAGNOSIS — Z0000 Encounter for general adult medical examination without abnormal findings: Secondary | ICD-10-CM | POA: Diagnosis not present

## 2018-09-22 DIAGNOSIS — I77810 Thoracic aortic ectasia: Secondary | ICD-10-CM | POA: Diagnosis not present

## 2018-09-22 DIAGNOSIS — Z882 Allergy status to sulfonamides status: Secondary | ICD-10-CM | POA: Diagnosis not present

## 2018-09-22 DIAGNOSIS — M069 Rheumatoid arthritis, unspecified: Secondary | ICD-10-CM | POA: Diagnosis not present

## 2018-09-22 DIAGNOSIS — R338 Other retention of urine: Secondary | ICD-10-CM | POA: Diagnosis not present

## 2018-09-22 DIAGNOSIS — M109 Gout, unspecified: Secondary | ICD-10-CM | POA: Diagnosis not present

## 2018-09-22 DIAGNOSIS — M40202 Unspecified kyphosis, cervical region: Secondary | ICD-10-CM | POA: Diagnosis not present

## 2018-09-22 DIAGNOSIS — Z20828 Contact with and (suspected) exposure to other viral communicable diseases: Secondary | ICD-10-CM | POA: Diagnosis not present

## 2018-09-22 DIAGNOSIS — M6281 Muscle weakness (generalized): Secondary | ICD-10-CM | POA: Diagnosis not present

## 2018-09-22 MED ORDER — ESOMEPRAZOLE MAGNESIUM 40 MG PO CPDR: 40.0000 mg | DELAYED_RELEASE_CAPSULE | Freq: Every day | ORAL | 1 refills | Status: DC

## 2018-09-22 MED ORDER — SIMVASTATIN 20 MG PO TABS: 20.0000 mg | ORAL_TABLET | Freq: Every day | ORAL | 1 refills | Status: DC

## 2018-09-22 MED ORDER — TERCONAZOLE 0.8 % VA CREA: 1.0000 | TOPICAL_CREAM | Freq: Every day | VAGINAL | 0 refills | Status: DC

## 2018-09-22 MED ORDER — MECLIZINE HCL 25 MG PO TABS: 12.5000 mg | ORAL_TABLET | Freq: Two times a day (BID) | ORAL | 0 refills | Status: DC | PRN

## 2018-09-22 NOTE — Progress Notes (Signed)
Subjective:    Patient ID: Meredith Phillips, female    DOB: 04/30/1946, 73 y.o.   MRN: [id number]  HPI  Virtual Visit via Video Note  I connected with Meredith Phillips on 09/22/18 at  2:00 PM EDT by a video enabled telemedicine application and verified that I am speaking with the correct person using two identifiers.   I discussed the limitations of evaluation and management by telemedicine and the availability of in person appointments. The patient expressed understanding and agreed to proceed.   History of Present Illness:  Pt getting established. She tells me that will have some upcoming surgery on September 30, 2018. She states it will be surgery to protect her vision. Although she does not explain exactly name of surgery. The eye MD wants her to get some labs done before the procedure. Eye MD wanted her to get tb blood test. They also wanted her to be evaluated for sarcoidosis.   Pt has htn history. Her blood pressure was 121/83. Pulse was 84.   Pt has hx of diabetes. Sugar was 144. Pt 3 months blood sugar average was 6.0 in January. Pt is on januvia.  Pt has history of gerd. She takes nexium.  Pt has history of high cholesterol. She is zocor. Last lipid panel looked good 6 months ago.  Pt has allergic rhinitis and she states meds control.  Hx of diabetic neuropathy years ago. She states no longer has. She wants to stay off gabapentin.     Observations/Objective: NAD.  Assessment and Plan: You do describe eye condition which requires eye surgery upcoming. TB testing needs to be done prior per Eye MD so placed that order.Also ask you call Eye MD office and get clarification on sarcoidosis testing.  For [place] refilled nexium  For htn continue current meds. Bp well controlled today.  For diabetes continue [place].  Discussed pt desire not to use gabapentin since no neuropathy but if symptoms return can rx prescription.  Will notify pt on lab results.   Follow up date to be  determined. Maybe 3 months in office after viral pandemic situation resolves.  Follow Up Instructions:    I discussed the assessment and treatment plan with the patient. The patient was provided an opportunity to ask questions and all were answered. The patient agreed with the plan and demonstrated an understanding of the instructions.   The patient was advised to call back or seek an in-person evaluation if the symptoms worsen or if the condition fails to improve as anticipated.  I provided  minutes of non-face-to-face 40 minute time during this encounter. Pt established care with me from other clinic in system(50% of time spent counseling pt on her upcoming eye surgery, need for labs as well as reviewing other chronic conditions.)  Mackie Pai, PA-C   Review of Systems  Constitutional: Negative for appetite change, diaphoresis, fatigue and fever.  HENT: Negative for congestion, drooling, nosebleeds, rhinorrhea, sinus pressure and sinus pain.   Respiratory: Negative for cough, chest tightness, shortness of breath and wheezing.   Cardiovascular: Negative for chest pain and palpitations.  Gastrointestinal: Negative for abdominal distention, abdominal pain, diarrhea, nausea and vomiting.  Genitourinary: Negative for difficulty urinating, dysuria, flank pain, urgency, vaginal discharge and vaginal pain.  Musculoskeletal: Negative for back pain and neck pain.  Skin: Negative for rash.  Neurological: Negative for dizziness, syncope, weakness and headaches.  Hematological: Negative for adenopathy. Does not bruise/bleed easily.  Psychiatric/Behavioral: Negative for behavioral problems, confusion and  sleep disturbance.   Past Medical History:  Diagnosis Date  . Arthritis   . Benign essential HTN 02/18/2015  . Cellulitis 02/18/2015  . Chicken pox   . Chronic fatigue   . Dementia ([place])   . Depression    since last 04/2018 daughter passed.    . Diabetes mellitus without complication ([place])    . Diabetic peripheral neuropathy ([place])   . GERD (gastroesophageal reflux disease)   . Hypercholesteremia   . Hypertension   . Liver disease   . Polyp of colon      Social History   Socioeconomic History  . Marital status: Married    Spouse name: Not on file  . Number of children: 5  . Years of education: 53  . Highest education level: Not on file  Occupational History  . Occupation: Retired  Scientific laboratory technician  . Financial resource strain: Not on file  . Food insecurity:    Worry: Not on file    Inability: Not on file  . Transportation needs:    Medical: Not on file    Non-medical: Not on file  Tobacco Use  . Smoking status: Never Smoker  . Smokeless tobacco: Never Used  Substance and Sexual Activity  . Alcohol use: No  . Drug use: No  . Sexual activity: Not on file  Lifestyle  . Physical activity:    Days per week: Not on file    Minutes per session: Not on file  . Stress: Not on file  Relationships  . Social connections:    Talks on phone: Not on file    Gets together: Not on file    Attends religious service: Not on file    Active member of club or organization: Not on file    Attends meetings of clubs or organizations: Not on file    Relationship status: Not on file  . Intimate partner violence:    Fear of current or ex partner: Not on file    Emotionally abused: Not on file    Physically abused: Not on file    Forced sexual activity: Not on file  Other Topics Concern  . Not on file  Social History Narrative   Fun: paint, word search   Feels safe where she is living and no concern for abuse   Denies religious beliefs effecting healthcare.     Past Surgical History:  Procedure Laterality Date  . APPENDECTOMY    . HERNIA REPAIR    . KNEE SURGERY    . ROTATOR CUFF REPAIR    . TONSILLECTOMY      Family History  Problem Relation Age of Onset  . Healthy Mother   . Healthy Father   . Healthy Paternal Grandmother   . Healthy Paternal Grandfather      Allergies  Allergen Reactions  . Codeine Other (See Comments)    Woke up the next morning and did not know where she was  . Other     Doesn't like to take pain medication due to hallucinations.  . Shellfish Allergy     Current Outpatient Medications on File Prior to Visit  Medication Sig Dispense Refill  . amLODipine (NORVASC) 10 MG tablet Take 1 tablet (10 mg total) by mouth daily. 90 tablet 0  . cloNIDine (CATAPRES) 0.1 MG tablet Take 1 tablet (0.1 mg total) by mouth 2 (two) times daily. 60 tablet 1  . gabapentin (NEURONTIN) 300 MG capsule Take 1 capsule (300 mg total) by mouth 3 (three)  times daily. TAKE 1 CAPSULE BY MOUTH THREE TIMES A DAY 270 capsule 0  . Lancets (ONETOUCH ULTRASOFT) lancets USE TO HELP CHECK BLOOD SUGARS TWICE A DAY E11.9 100 each 1  . meloxicam (MOBIC) 7.5 MG tablet Take 1 tablet (7.5 mg total) by mouth daily. 30 tablet 1  . sitaGLIPtin (JANUVIA) 100 MG tablet Take 1 tablet (100 mg total) by mouth daily. 30 tablet 3  . aspirin EC 81 MG tablet Take 81 mg by mouth daily.    . Blood Glucose Monitoring Suppl (ONE TOUCH ULTRA 2) w/Device KIT Use to check blood sugars twice a day Dx E11.9 1 each 0  . Cholecalciferol (VITAMIN D-3) 1000 UNITS CAPS Take 1,000 Units by mouth.    . cyanocobalamin 1000 MCG tablet Take 1,000 mcg by mouth daily.    [name] glucose blood (ONE TOUCH ULTRA TEST) test strip USE TO CHECK BLOOD SUGARS TWICE A DAY DX E11.9 100 each 1  . Multiple Vitamin (MULTIVITAMIN) tablet Take 1 tablet by mouth daily.     No current facility-administered medications on file prior to visit.     BP 121/83 Comment: pt checked on home monitor  Pulse 84       Objective:   Physical Exam  NA.      Assessment & Plan:

## 2018-09-22 NOTE — Patient Instructions (Addendum)
You do describe eye condition which requires eye surgery upcoming. TB testing needs to be done prior per Eye MD so placed that order.Also ask you call Eye MD office and get clarification on sarcoidosis testing.  For Meredith Phillips refilled nexium  For htn continue current meds. Bp well controlled today.  For diabetes continue [place].  Discussed pt desire not to use gabapentin since no neuropathy but if symptoms return can rx prescription.  Will notify pt on lab results.   Follow up date to be determined. Maybe 3 months in office after viral pandemic situation resolves.

## 2018-09-23 ENCOUNTER — Other Ambulatory Visit (INDEPENDENT_AMBULATORY_CARE_PROVIDER_SITE_OTHER): Payer: PPO

## 2018-09-23 DIAGNOSIS — Z79899 Other long term (current) drug therapy: Secondary | ICD-10-CM | POA: Diagnosis not present

## 2018-09-23 DIAGNOSIS — E785 Hyperlipidemia, unspecified: Secondary | ICD-10-CM | POA: Diagnosis not present

## 2018-09-23 DIAGNOSIS — I5032 Chronic diastolic (congestive) heart failure: Secondary | ICD-10-CM | POA: Diagnosis not present

## 2018-09-23 DIAGNOSIS — T22212A Burn of second degree of left forearm, initial encounter: Secondary | ICD-10-CM | POA: Diagnosis not present

## 2018-09-23 DIAGNOSIS — E118 Type 2 diabetes mellitus with unspecified complications: Secondary | ICD-10-CM | POA: Diagnosis not present

## 2018-09-23 DIAGNOSIS — M6281 Muscle weakness (generalized): Secondary | ICD-10-CM | POA: Diagnosis not present

## 2018-09-23 DIAGNOSIS — Z4800 Encounter for change or removal of nonsurgical wound dressing: Secondary | ICD-10-CM | POA: Diagnosis not present

## 2018-09-23 DIAGNOSIS — I480 Paroxysmal atrial fibrillation: Secondary | ICD-10-CM | POA: Diagnosis not present

## 2018-09-23 DIAGNOSIS — N183 Chronic kidney disease, stage 3 unspecified: Secondary | ICD-10-CM | POA: Diagnosis not present

## 2018-09-23 DIAGNOSIS — R197 Diarrhea, unspecified: Secondary | ICD-10-CM | POA: Diagnosis not present

## 2018-09-23 DIAGNOSIS — E039 Hypothyroidism, unspecified: Secondary | ICD-10-CM | POA: Diagnosis not present

## 2018-09-23 DIAGNOSIS — Z9181 History of falling: Secondary | ICD-10-CM | POA: Diagnosis not present

## 2018-09-23 DIAGNOSIS — I87323 Chronic venous hypertension (idiopathic) with inflammation of bilateral lower extremity: Secondary | ICD-10-CM | POA: Diagnosis not present

## 2018-09-23 DIAGNOSIS — G4733 Obstructive sleep apnea (adult) (pediatric): Secondary | ICD-10-CM | POA: Diagnosis not present

## 2018-09-23 DIAGNOSIS — K760 Fatty (change of) liver, not elsewhere classified: Secondary | ICD-10-CM | POA: Diagnosis not present

## 2018-09-23 DIAGNOSIS — Z111 Encounter for screening for respiratory tuberculosis: Secondary | ICD-10-CM

## 2018-09-23 DIAGNOSIS — Z7901 Long term (current) use of anticoagulants: Secondary | ICD-10-CM | POA: Diagnosis not present

## 2018-09-23 DIAGNOSIS — R32 Unspecified urinary incontinence: Secondary | ICD-10-CM | POA: Diagnosis not present

## 2018-09-23 DIAGNOSIS — S43005D Unspecified dislocation of left shoulder joint, subsequent encounter: Secondary | ICD-10-CM | POA: Diagnosis not present

## 2018-09-23 DIAGNOSIS — D631 Anemia in chronic kidney disease: Secondary | ICD-10-CM | POA: Diagnosis not present

## 2018-09-23 DIAGNOSIS — Z6841 Body Mass Index (BMI) 40.0 and over, adult: Secondary | ICD-10-CM | POA: Diagnosis not present

## 2018-09-23 DIAGNOSIS — I130 Hypertensive heart and chronic kidney disease with heart failure and stage 1 through stage 4 chronic kidney disease, or unspecified chronic kidney disease: Secondary | ICD-10-CM | POA: Diagnosis not present

## 2018-09-23 LAB — LIPID PANEL
Cholesterol: 249 mg/dL — ABNORMAL HIGH (ref 0–200)
HDL: 66.2 mg/dL (ref >39.00)
LDL Cholesterol: 170 mg/dL — ABNORMAL HIGH (ref 0–99)
NonHDL: 183.03
Total CHOL/HDL Ratio: 4
Triglycerides: 67 mg/dL (ref 0.0–149.0)
VLDL: 13.4 mg/dL (ref 0.0–40.0)

## 2018-09-23 LAB — COMPREHENSIVE METABOLIC PANEL
ALT: 8 U/L (ref 0–35)
AST: 12 U/L (ref 0–37)
Albumin: 4.2 g/dL (ref 3.5–5.2)
Alkaline Phosphatase: 55 U/L (ref 39–117)
BUN: 9 mg/dL (ref 6–23)
CO2: 27 mEq/L (ref 19–32)
Calcium: 10.5 mg/dL (ref 8.4–10.5)
Chloride: 104 mEq/L (ref 96–112)
Creatinine, Ser: 0.97 mg/dL (ref 0.40–1.20)
GFR: 67.7 mL/min (ref >60.00)
Glucose, Bld: 108 mg/dL — ABNORMAL HIGH (ref 70–99)
Potassium: 3.9 mEq/L (ref 3.5–5.1)
Sodium: 139 mEq/L (ref 135–145)
Total Bilirubin: 0.6 mg/dL (ref 0.2–1.2)
Total Protein: 7.5 g/dL (ref 6.0–8.3)

## 2018-09-23 LAB — HEMOGLOBIN A1C: Hgb A1c MFr Bld: 5.6 % (ref 4.6–6.5)

## 2018-09-25 LAB — QUANTIFERON-TB GOLD PLUS
Mitogen-NIL: >10 IU/mL
NIL: 0.02 IU/mL
QuantiFERON-TB Gold Plus: NEGATIVE
TB1-NIL: 0 IU/mL
TB2-NIL: 0 IU/mL

## 2018-09-28 ENCOUNTER — Telehealth: Payer: Self-pay | Admitting: Medical

## 2018-09-28 MED ORDER — ATORVASTATIN CALCIUM 20 MG PO TABS: 20.0000 mg | ORAL_TABLET | Freq: Every day | ORAL | 3 refills | Status: DC

## 2018-09-28 NOTE — Telephone Encounter (Signed)
rx lipitor in place of zocor.

## 2018-09-30 DIAGNOSIS — H3561 Retinal hemorrhage, right eye: Secondary | ICD-10-CM | POA: Diagnosis not present

## 2018-09-30 DIAGNOSIS — K219 Gastro-esophageal reflux disease without esophagitis: Secondary | ICD-10-CM | POA: Diagnosis not present

## 2018-09-30 DIAGNOSIS — N1330 Unspecified hydronephrosis: Secondary | ICD-10-CM | POA: Diagnosis not present

## 2018-09-30 DIAGNOSIS — Z1211 Encounter for screening for malignant neoplasm of colon: Secondary | ICD-10-CM | POA: Diagnosis not present

## 2018-09-30 DIAGNOSIS — H348311 Tributary (branch) retinal vein occlusion, right eye, with retinal neovascularization: Secondary | ICD-10-CM | POA: Diagnosis not present

## 2018-09-30 DIAGNOSIS — R194 Change in bowel habit: Secondary | ICD-10-CM | POA: Diagnosis not present

## 2018-09-30 DIAGNOSIS — H34831 Tributary (branch) retinal vein occlusion, right eye, with macular edema: Secondary | ICD-10-CM | POA: Diagnosis not present

## 2018-10-01 DIAGNOSIS — Z961 Presence of intraocular lens: Secondary | ICD-10-CM | POA: Diagnosis not present

## 2018-10-01 DIAGNOSIS — H40211 Acute angle-closure glaucoma, right eye: Secondary | ICD-10-CM | POA: Diagnosis not present

## 2018-10-01 DIAGNOSIS — Z79899 Other long term (current) drug therapy: Secondary | ICD-10-CM | POA: Diagnosis not present

## 2018-10-01 DIAGNOSIS — H2511 Age-related nuclear cataract, right eye: Secondary | ICD-10-CM | POA: Diagnosis not present

## 2018-10-01 DIAGNOSIS — H348311 Tributary (branch) retinal vein occlusion, right eye, with retinal neovascularization: Secondary | ICD-10-CM | POA: Diagnosis not present

## 2018-10-01 DIAGNOSIS — Z7952 Long term (current) use of systemic steroids: Secondary | ICD-10-CM | POA: Diagnosis not present

## 2018-10-01 LAB — HM DIABETES EYE EXAM

## 2018-10-04 DIAGNOSIS — H40211 Acute angle-closure glaucoma, right eye: Secondary | ICD-10-CM | POA: Diagnosis not present

## 2018-10-04 DIAGNOSIS — H348311 Tributary (branch) retinal vein occlusion, right eye, with retinal neovascularization: Secondary | ICD-10-CM | POA: Diagnosis not present

## 2018-10-04 DIAGNOSIS — Z9842 Cataract extraction status, left eye: Secondary | ICD-10-CM | POA: Diagnosis not present

## 2018-10-04 DIAGNOSIS — H2511 Age-related nuclear cataract, right eye: Secondary | ICD-10-CM | POA: Diagnosis not present

## 2018-10-04 DIAGNOSIS — H348312 Tributary (branch) retinal vein occlusion, right eye, stable: Secondary | ICD-10-CM | POA: Diagnosis not present

## 2018-10-04 DIAGNOSIS — Z961 Presence of intraocular lens: Secondary | ICD-10-CM | POA: Diagnosis not present

## 2018-10-06 DIAGNOSIS — H40211 Acute angle-closure glaucoma, right eye: Secondary | ICD-10-CM | POA: Diagnosis not present

## 2018-10-10 ENCOUNTER — Other Ambulatory Visit: Payer: Self-pay | Admitting: Medical

## 2018-10-11 ENCOUNTER — Other Ambulatory Visit: Payer: Self-pay | Admitting: Medical

## 2018-10-11 ENCOUNTER — Telehealth: Payer: Self-pay | Admitting: *Deleted

## 2018-10-11 DIAGNOSIS — I10 Essential (primary) hypertension: Secondary | ICD-10-CM

## 2018-10-11 MED ORDER — CLONIDINE HCL 0.1 MG PO TABS: 0.1000 mg | ORAL_TABLET | Freq: Two times a day (BID) | ORAL | 5 refills | Status: DC

## 2018-10-11 NOTE — Telephone Encounter (Signed)
Copied from CRM [id number]. Topic: Quick Communication - Rx Refill/Question >> Oct 11, 2018  3:30 PM Richarda Blade wrote: Medication: amLODipine (NORVASC) 10 MG tablet [[id number]]   Has the patient contacted their pharmacy? No. (Agent: If no, request that the patient contact the pharmacy for the refill.) The patient is out of this medication and needs it urgently   Preferred Pharmacy (with phone number or street name): [id number] - [place], [place] - [address] AT [facility] [address] [phone] (Phone) [phone] (Fax)    Agent: Please be advised that RX refills may take up to 3 business days. We ask that you follow-up with your pharmacy.

## 2018-10-11 NOTE — Telephone Encounter (Signed)
Spoke with pt. She states side pain has resolved. Advised pt if pain returns to call and schedule a Virtual Visit with PCP for further evaluation and she voices understanding. Pt's husband said pt was needing refill on 2 medications but pt is unable to tell which medications she may need. Advised her that it appears she should be out of Clonidine refills based on Epic history. Last Rx for this was 05/2018 #60 x 1 refill. Rx sent.  Copied from CRM [id number]. Topic: General - Other >> Oct 08, 2018  4:56 PM Tamela Oddi wrote: Reason for CRM: Patient called to request that the nurse call her regarding some pain she is having on her right side.  Patient said she is not sure where it is coming from but would like to discuss it with the nurse before making an appointment.  CB# [phone]

## 2018-10-12 MED ORDER — AMLODIPINE BESYLATE 10 MG PO TABS: 10.0000 mg | ORAL_TABLET | Freq: Every day | ORAL | 1 refills | Status: DC

## 2018-10-12 NOTE — Telephone Encounter (Signed)
Rx sent 

## 2018-10-18 ENCOUNTER — Telehealth: Payer: Self-pay | Admitting: *Deleted

## 2018-10-18 NOTE — Telephone Encounter (Signed)
Received Diabetic & Comprehensive Eye Exam Report from Dr. Bernadene Bell, MD, [facility]; forwarded to provider/SLS 05/04

## 2018-10-19 ENCOUNTER — Ambulatory Visit (INDEPENDENT_AMBULATORY_CARE_PROVIDER_SITE_OTHER): Payer: PPO | Admitting: Medical

## 2018-10-19 ENCOUNTER — Encounter: Payer: Self-pay | Admitting: Medical

## 2018-10-19 ENCOUNTER — Other Ambulatory Visit: Payer: Self-pay

## 2018-10-19 VITALS — BP 115/76 | HR 70 | Wt 145.0 lb

## 2018-10-19 DIAGNOSIS — R5383 Other fatigue: Secondary | ICD-10-CM

## 2018-10-19 DIAGNOSIS — M549 Dorsalgia, unspecified: Secondary | ICD-10-CM

## 2018-10-19 NOTE — Patient Instructions (Signed)
Patient's primary complaint over the past month has been gradually worsening fatigue.  Worse over the last week.  Also she reports back pain/costovertebral area mostly described on interview.  With these reported symptoms I do think it is necessary to do labs tomorrow as well as a urine studies to evaluate if she might have urinary tract infection.  Placed future labs to be done.  Among these are CBC, CMP, TSH, iron and B12.  She will give a urine sample and will go ahead and send culture out as well.  During the interim asked patient to make sure that she stays well-hydrated.  She has any worsening or changing signs symptoms let us know.  I am going to ask staff to check O2 sat when she is in for the labs tomorrow morning. Summary of test are stat tomorrow Mondays are the CBC and metabolic panel.  Follow-up date to be determined after lab review.

## 2018-10-19 NOTE — Progress Notes (Signed)
Subjective:    Patient ID: Meredith Phillips, female    DOB: [birth year], 73 y.o.   MRN: [id number]  HPI  Virtual Visit via Video Note  I connected with Meredith Phillips on 10/19/18 at  3:40 PM EDT by a video enabled telemedicine application and verified that I am speaking with the correct person using two identifiers.  Location: Patient: home Provider: home  Visit done virtually due to covid pandemic. Best to do this manner in light of situation.   I discussed the limitations of evaluation and management by telemedicine and the availability of in person appointments. The patient expressed understanding and agreed to proceed.  History of Present Illness:  Pt states doing ok. She states she is feeling progressively weaker over past month. She also notes getting winded easier than usual. For example when walks distance of house will get short. Pt states some back pain in past. Was worse in past but presently mild.Possible left side cva area pain. No pain on urination. No suprapubic pain.   On review no thyroid disease, anemia or kidney failure. Does have history of mild elevated sugars/diabets. Her a1c last visit was 5.6.  No wheezing reported. No weight gain.   Note on review of pt chart she did have negative stress test in past and not reporting any chest pain.    Observations/Objective: General- no acute distress. She looks little tired today. Alert an oriented. Neuro- gross motor and sensory function intact.  Neck- on inspection. No jvd. Lower ext- no pedal edema   Assessment and Plan: Patient's primary complaint over the past month has been gradually worsening fatigue.  Worse over the last week.  Also she reports back pain/costovertebral area mostly described on interview.  With these reported symptoms I do think it is necessary to do labs tomorrow as well as a urine studies to evaluate if she might have urinary tract infection.  Placed future labs to be done.  Among these are  CBC, CMP, TSH, iron and B12.  She will give a urine sample and will go ahead and send culture out as well.  During the interim asked patient to make sure that she stays well-hydrated.  She has any worsening or changing signs symptoms let us know.  I am going to ask staff to check O2 sat when she is in for the labs tomorrow morning. Summary of test are stat tomorrow Mondays are the CBC and metabolic panel.  Follow-up date to be determined after lab review.  Follow Up Instructions:    I discussed the assessment and treatment plan with the patient. The patient was provided an opportunity to ask questions and all were answered. The patient agreed with the plan and demonstrated an understanding of the instructions.   The patient was advised to call back or seek an in-person evaluation if the symptoms worsen or if the condition fails to improve as anticipated.     Esperanza Richters, PA-C   Review of Systems  Constitutional: Positive for fatigue. Negative for chills and fever.  HENT: Negative for congestion, ear discharge, facial swelling and mouth sores.   Respiratory: Negative for cough, chest tightness, shortness of breath and wheezing.        But states mild winded walking distance of her house.  Cardiovascular: Negative for chest pain and palpitations.  Gastrointestinal: Negative for abdominal distention, abdominal pain, blood in stool, constipation, diarrhea, nausea and vomiting.  Endocrine: Negative for polydipsia, polyphagia and polyuria.  Genitourinary: Negative for difficulty  urinating, dysuria, frequency and urgency.  Musculoskeletal: Positive for back pain. Negative for myalgias and neck pain.       Mild costochondral area noted. See hpi.  Skin: Negative for rash.  Neurological: Negative for facial asymmetry, speech difficulty, weakness and light-headedness.  Hematological: Negative for adenopathy. Does not bruise/bleed easily.  Psychiatric/Behavioral: Negative for behavioral  problems, decreased concentration and suicidal ideas. The patient is not nervous/anxious.        Objective:   Physical Exam        Assessment & Plan:

## 2018-10-21 ENCOUNTER — Other Ambulatory Visit (INDEPENDENT_AMBULATORY_CARE_PROVIDER_SITE_OTHER): Payer: PPO

## 2018-10-21 ENCOUNTER — Other Ambulatory Visit: Payer: Self-pay

## 2018-10-21 DIAGNOSIS — R5383 Other fatigue: Secondary | ICD-10-CM | POA: Diagnosis not present

## 2018-10-21 DIAGNOSIS — M549 Dorsalgia, unspecified: Secondary | ICD-10-CM

## 2018-10-21 LAB — COMPREHENSIVE METABOLIC PANEL
ALT: 9 U/L (ref 0–35)
AST: 13 U/L (ref 0–37)
Albumin: 4.4 g/dL (ref 3.5–5.2)
Alkaline Phosphatase: 61 U/L (ref 39–117)
BUN: 11 mg/dL (ref 6–23)
CO2: 22 mEq/L (ref 19–32)
Calcium: 10.3 mg/dL (ref 8.4–10.5)
Chloride: 108 mEq/L (ref 96–112)
Creatinine, Ser: 1.13 mg/dL (ref 0.40–1.20)
GFR: 56.75 mL/min — ABNORMAL LOW (ref >60.00)
Glucose, Bld: 111 mg/dL — ABNORMAL HIGH (ref 70–99)
Potassium: 3.6 mEq/L (ref 3.5–5.1)
Sodium: 139 mEq/L (ref 135–145)
Total Bilirubin: 0.4 mg/dL (ref 0.2–1.2)
Total Protein: 7.7 g/dL (ref 6.0–8.3)

## 2018-10-21 LAB — POC URINALSYSI DIPSTICK (AUTOMATED)
Bilirubin, UA: NEGATIVE
Blood, UA: NEGATIVE
Glucose, UA: NEGATIVE
Ketones, UA: NEGATIVE
Leukocytes, UA: NEGATIVE
Nitrite, UA: NEGATIVE
Protein, UA: POSITIVE — AB
Spec Grav, UA: <=1.005 — AB (ref 1.010–1.025)
Urobilinogen, UA: 0.2 E.U./dL
pH, UA: 6.5 (ref 5.0–8.0)

## 2018-10-21 LAB — CBC WITH DIFFERENTIAL/PLATELET
Basophils Absolute: 0 10*3/uL (ref 0.0–0.1)
Basophils Relative: 0.6 % (ref 0.0–3.0)
Eosinophils Absolute: 0.2 10*3/uL (ref 0.0–0.7)
Eosinophils Relative: 3.7 % (ref 0.0–5.0)
HCT: 38 % (ref 36.0–46.0)
Hemoglobin: 12.7 g/dL (ref 12.0–15.0)
Lymphocytes Relative: 46.8 % — ABNORMAL HIGH (ref 12.0–46.0)
Lymphs Abs: 3.1 10*3/uL (ref 0.7–4.0)
MCHC: 33.6 g/dL (ref 30.0–36.0)
MCV: 87.7 fl (ref 78.0–100.0)
Monocytes Absolute: 0.4 10*3/uL (ref 0.1–1.0)
Monocytes Relative: 6.5 % (ref 3.0–12.0)
Neutro Abs: 2.8 10*3/uL (ref 1.4–7.7)
Neutrophils Relative %: 42.4 % — ABNORMAL LOW (ref 43.0–77.0)
Platelets: 365 10*3/uL (ref 150.0–400.0)
RBC: 4.33 Mil/uL (ref 3.87–5.11)
RDW: 13.6 % (ref 11.5–15.5)
WBC: 6.5 10*3/uL (ref 4.0–10.5)

## 2018-10-22 LAB — URINE CULTURE
MICRO NUMBER:: 454071
SPECIMEN QUALITY:: ADEQUATE

## 2018-10-26 DIAGNOSIS — H348311 Tributary (branch) retinal vein occlusion, right eye, with retinal neovascularization: Secondary | ICD-10-CM | POA: Diagnosis not present

## 2018-10-26 DIAGNOSIS — H2511 Age-related nuclear cataract, right eye: Secondary | ICD-10-CM | POA: Diagnosis not present

## 2018-10-26 DIAGNOSIS — Z79899 Other long term (current) drug therapy: Secondary | ICD-10-CM | POA: Diagnosis not present

## 2018-10-26 DIAGNOSIS — H40211 Acute angle-closure glaucoma, right eye: Secondary | ICD-10-CM | POA: Diagnosis not present

## 2018-10-27 IMAGING — DX DG SHOULDER 2+V*R*
3 series · 3 of 3 positions shown · non-contrast
Comparison: None.

CLINICAL DATA: Right shoulder pain since a fall 1 month ago.
Initial encounter.

EXAM:
RIGHT SHOULDER - 2+ VIEW

[grashey]
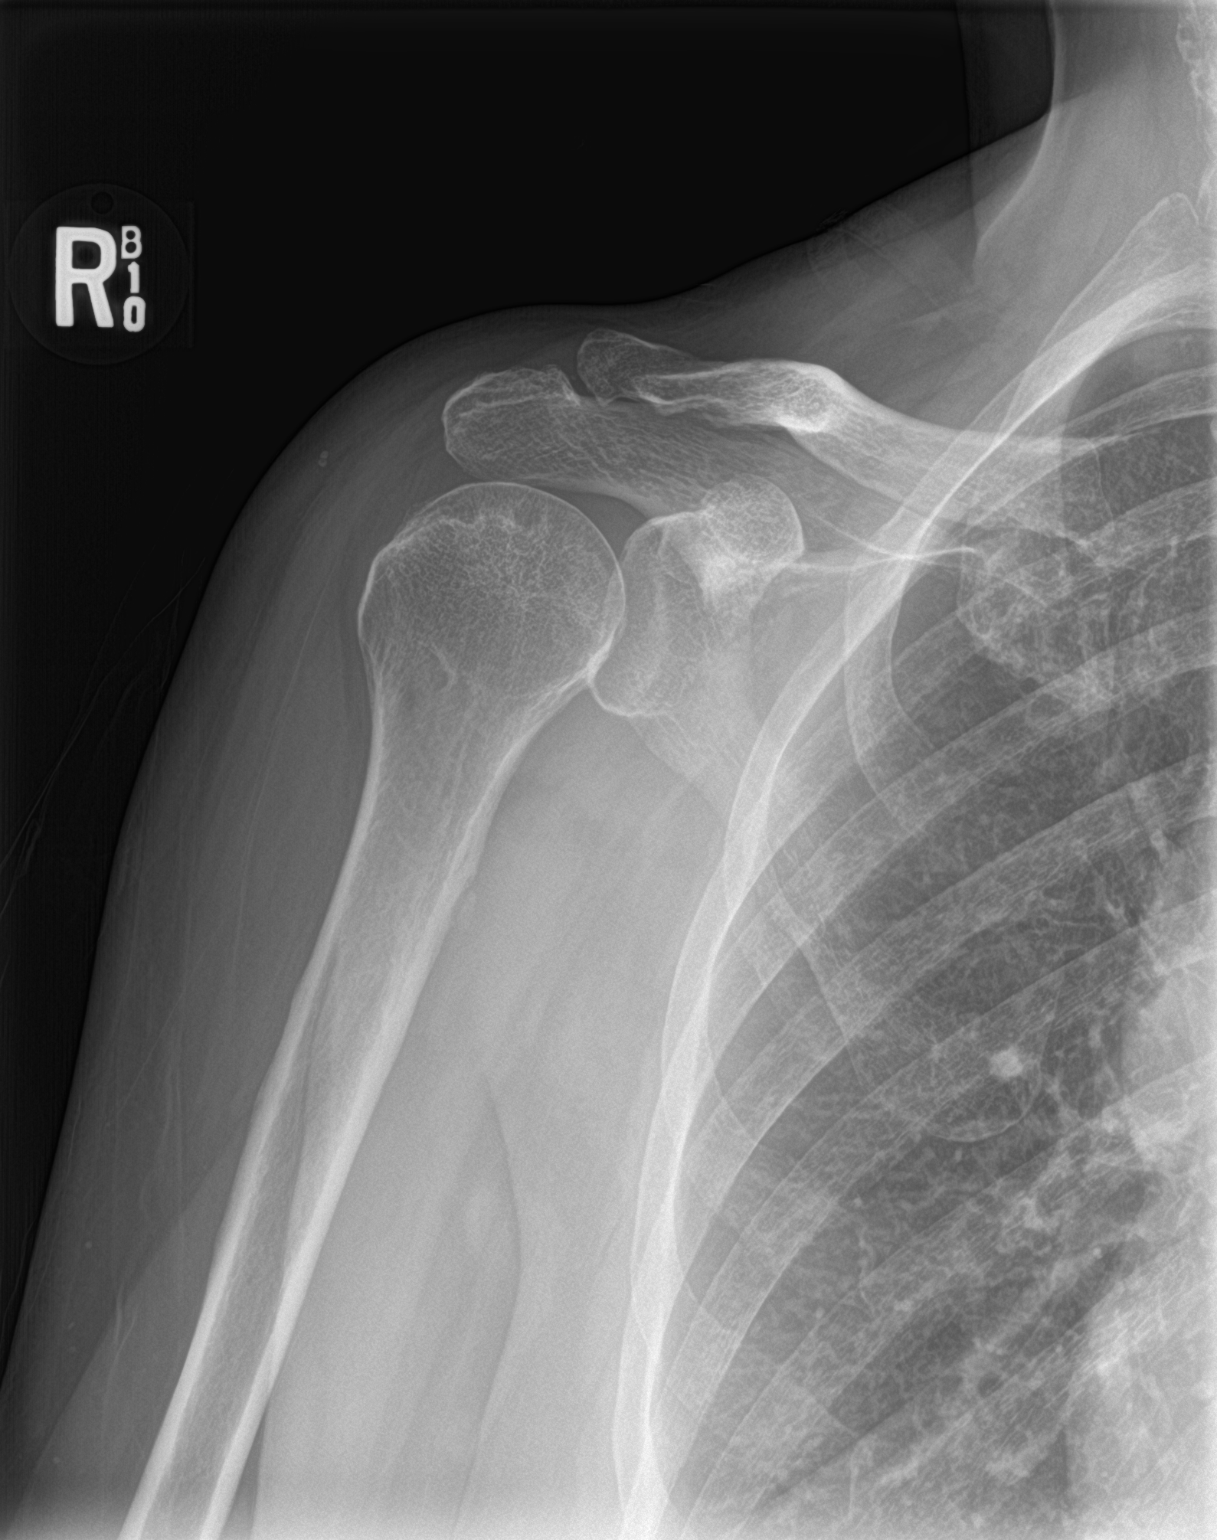

[y view]
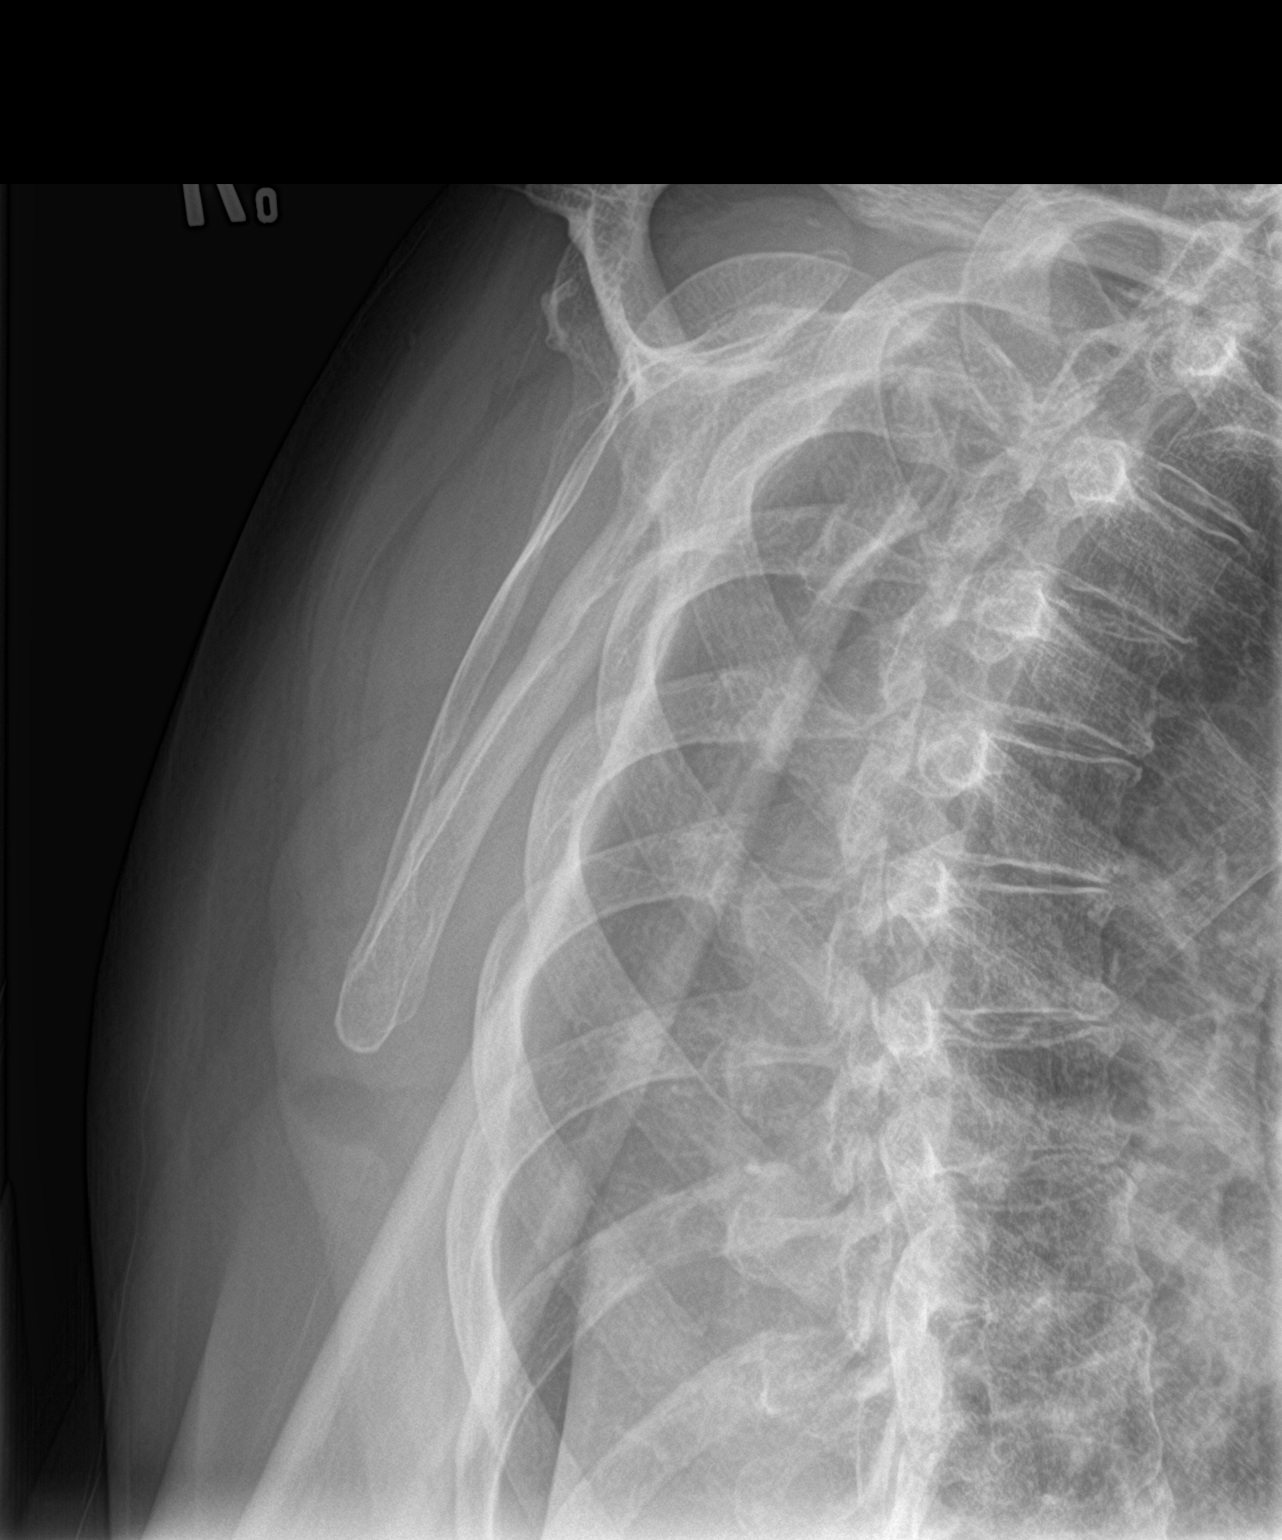

[shoulder axial]
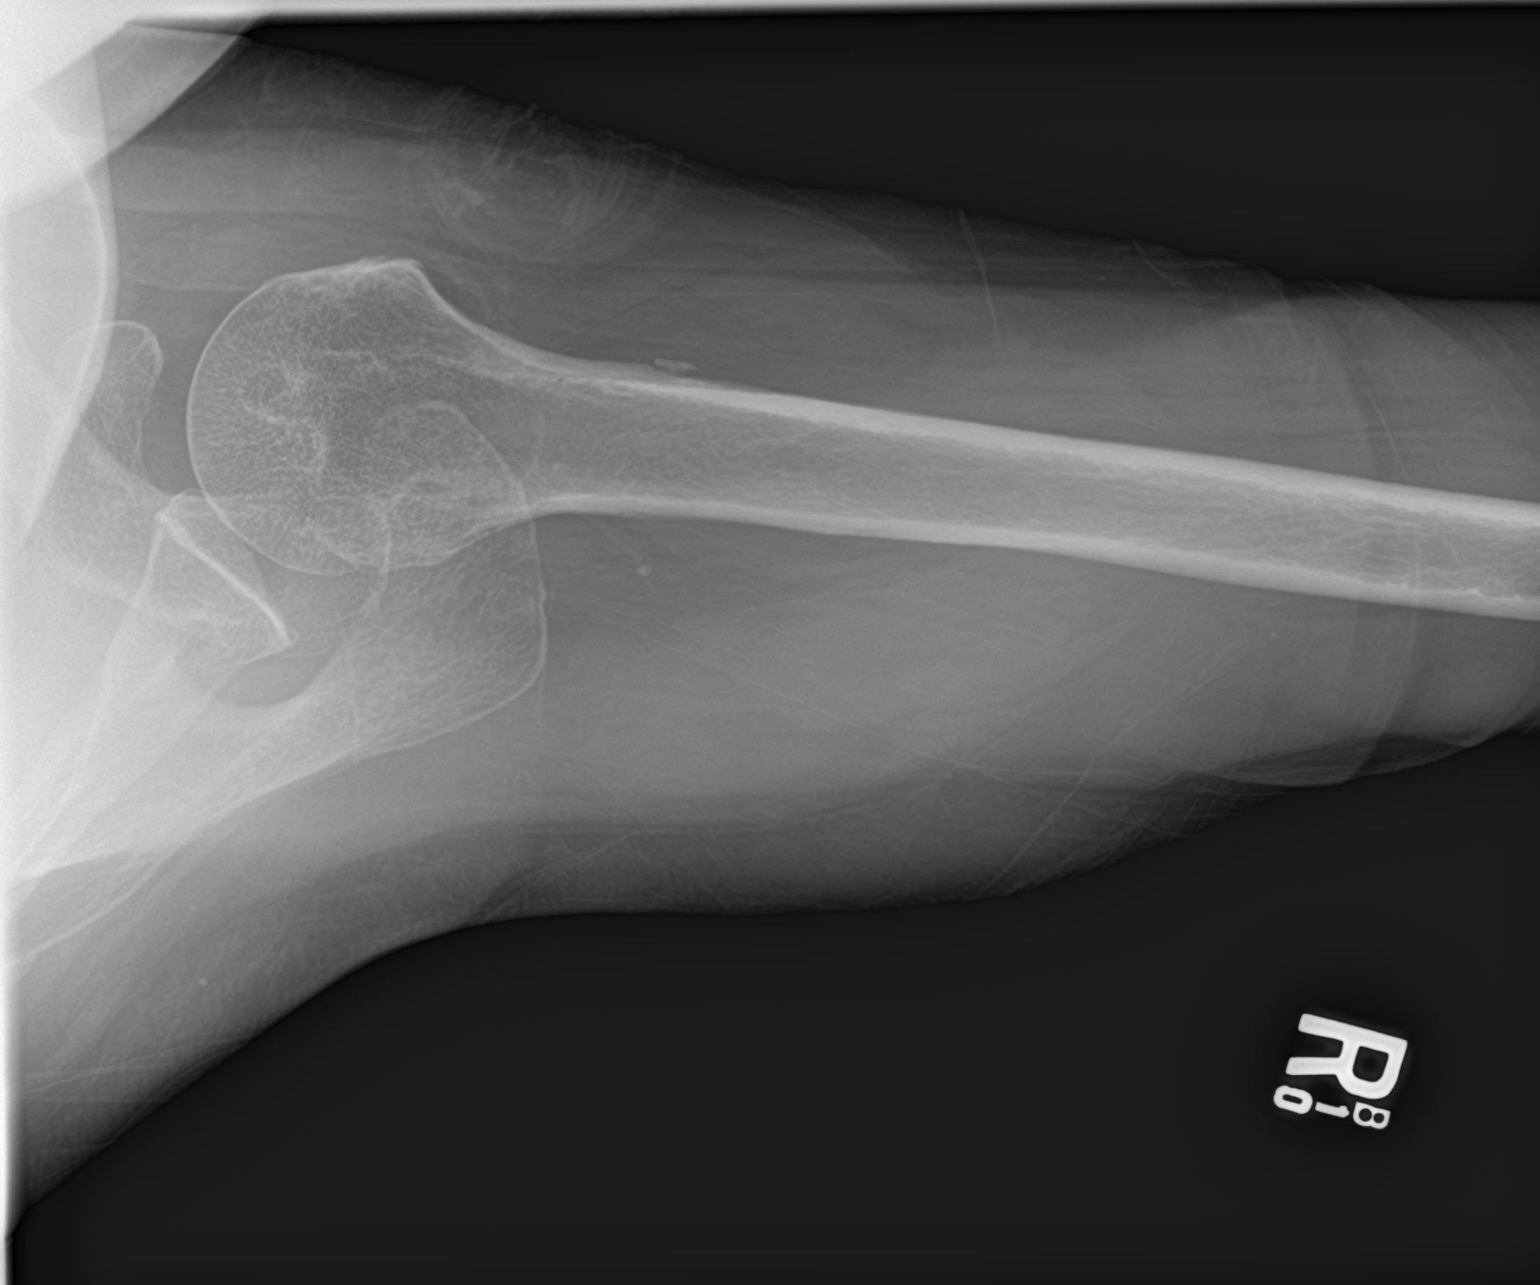

[3 of 3 positions shown; findings below may reference images not displayed]

FINDINGS: There is no evidence of fracture or dislocation. There is no
evidence of arthropathy or other focal bone abnormality. Soft
tissues are unremarkable.
IMPRESSION: Negative exam.

## 2018-10-28 DIAGNOSIS — H34831 Tributary (branch) retinal vein occlusion, right eye, with macular edema: Secondary | ICD-10-CM | POA: Diagnosis not present

## 2018-11-02 DIAGNOSIS — H2511 Age-related nuclear cataract, right eye: Secondary | ICD-10-CM | POA: Diagnosis not present

## 2018-11-03 ENCOUNTER — Other Ambulatory Visit: Payer: Self-pay

## 2018-11-03 ENCOUNTER — Ambulatory Visit: Payer: Self-pay

## 2018-11-03 ENCOUNTER — Encounter: Payer: Self-pay | Admitting: Medical

## 2018-11-03 ENCOUNTER — Ambulatory Visit (INDEPENDENT_AMBULATORY_CARE_PROVIDER_SITE_OTHER): Payer: PPO | Admitting: Medical

## 2018-11-03 VITALS — BP 119/75 | HR 75

## 2018-11-03 DIAGNOSIS — I10 Essential (primary) hypertension: Secondary | ICD-10-CM | POA: Diagnosis not present

## 2018-11-03 DIAGNOSIS — R42 Dizziness and giddiness: Secondary | ICD-10-CM

## 2018-11-03 DIAGNOSIS — R5383 Other fatigue: Secondary | ICD-10-CM

## 2018-11-03 NOTE — Telephone Encounter (Signed)
Virtual visit scheduled.  

## 2018-11-03 NOTE — Progress Notes (Signed)
Subjective:    Patient ID: Meredith Phillips, female    DOB: 1946/04/19, 73 y.o.   MRN: [id number]  HPI  Virtual Visit via Video Note  I connected with Meredith Phillips on 11/03/18 at  3:40 PM EDT by a video enabled telemedicine application and verified that I am speaking with the correct person using two identifiers.  Location: Patient: home Provider: home   I discussed the limitations of evaluation and management by telemedicine and the availability of in person appointments. The patient expressed understanding and agreed to proceed.  History of Present Illness:  Pt states that her bp is elevated today. Presently her first reading was 163/143. Pt states upcoming surgery for her eye(cataract) on May 28,2020. She is on amlodipine and clonidine. But on 2 rechecks bp readings very well controlled.   Pt second check was much better. 119/75.  She is on amlodipine 10 mg daily. She is on clonidone 0.1 mg twice daily. She admits sometimes does not take medication for bp and states not sure if she takes when should.   Presently has faint low level ha and mild dizzy. But states dizzy on and off for 2 weeks.  After eye procedure last week. Lateral aspect of rt eye turned red. Pt states eye doctor told her his eye is healing well.        Observations/Objective: No acute distress. Lungs- unlabored on inspection. Neuro- gross motor function appears intact. Rt eye- lateral aspect red/pinkish appearance.  Assessment and Plan: Your blood pressure is very well controlled presently. 2 reading while on visit with me was well controlled. Not sure about your 163/143 readings. We discussed making sure you take clonidine on tight regular schedule to avoid rebound hypertension. Take amlodipine daily. Clonidine 0.1 mg 8 am and 8 pm daily.   If your ha worsens, slight dizziness worsens or other neurologic type signs/symptoms then recommend ED evaluation.  For fatigue want you to get cbc and cmp  tomorrow. Also want to check bp in our office when in for lab work.  Follow up date to be determined after lab and bp check in office.  Debora Stockdale, PA-C   40 minutes spent with pt. 50% of time spent with pt discussing bp level, potential cause for transient elevation and counseling on plan going forward  Follow Up Instructions:    I discussed the assessment and treatment plan with the patient. The patient was provided an opportunity to ask questions and all were answered. The patient agreed with the plan and demonstrated an understanding of the instructions.   The patient was advised to call back or seek an in-person evaluation if the symptoms worsen or if the condition fails to improve as anticipated.     Mackie Pai, PA-C    Review of Systems  Constitutional: Negative for chills, fatigue and fever.  HENT: Negative for congestion, ear pain and facial swelling.   Respiratory: Negative for cough, chest tightness, shortness of breath and wheezing.   Cardiovascular: Negative for chest pain and palpitations.  Gastrointestinal: Negative for abdominal pain, diarrhea, nausea and vomiting.  Musculoskeletal: Negative for back pain and neck pain.  Neurological: Positive for dizziness. Negative for syncope, weakness, light-headedness and headaches.       Slight ha frontal.  Hematological: Negative for adenopathy. Does not bruise/bleed easily.  Psychiatric/Behavioral: Negative for behavioral problems and confusion. The patient is not nervous/anxious.    Past Medical History:  Diagnosis Date   Arthritis    Benign essential  HTN 02/18/2015   Cellulitis 02/18/2015   Chicken pox    Chronic fatigue    Dementia ([place])    Depression    since last 04/2018 daughter passed.     Diabetes mellitus without complication (HCC)    Diabetic peripheral neuropathy (HCC)    GERD (gastroesophageal reflux disease)    Hypercholesteremia    Hypertension    Liver disease    Polyp of colon       Social History   Socioeconomic History   Marital status: Married    Spouse name: Not on file   Number of children: 5   Years of education: 12   Highest education level: Not on file  Occupational History   Occupation: Retired  Scientist, product/process development strain: Not on file   Food insecurity:    Worry: Not on file    Inability: Not on Lexicographer needs:    Medical: Not on file    Non-medical: Not on file  Tobacco Use   Smoking status: Never Smoker   Smokeless tobacco: Never Used  Substance and Sexual Activity   Alcohol use: No   Drug use: No   Sexual activity: Not on file  Lifestyle   Physical activity:    Days per week: Not on file    Minutes per session: Not on file   Stress: Not on file  Relationships   Social connections:    Talks on phone: Not on file    Gets together: Not on file    Attends religious service: Not on file    Active member of club or organization: Not on file    Attends meetings of clubs or organizations: Not on file    Relationship status: Not on file   Intimate partner violence:    Fear of current or ex partner: Not on file    Emotionally abused: Not on file    Physically abused: Not on file    Forced sexual activity: Not on file  Other Topics Concern   Not on file  Social History Narrative   Fun: paint, word search   Feels safe where she is living and no concern for abuse   Denies religious beliefs effecting healthcare.     Past Surgical History:  Procedure Laterality Date   APPENDECTOMY     HERNIA REPAIR     KNEE SURGERY     ROTATOR CUFF REPAIR     TONSILLECTOMY      Family History  Problem Relation Age of Onset   Healthy Mother    Healthy Father    Healthy Paternal Grandmother    Healthy Paternal Grandfather     Allergies  Allergen Reactions   Codeine Other (See Comments)    Woke up the next morning and did not know where she was   Other     Doesn't like to take pain  medication due to hallucinations.   Shellfish Allergy     Current Outpatient Medications on File Prior to Visit  Medication Sig Dispense Refill   amLODipine (NORVASC) 10 MG tablet Take 1 tablet (10 mg total) by mouth daily. 90 tablet 1   aspirin EC 81 MG tablet Take 81 mg by mouth daily.     atorvastatin (LIPITOR) 20 MG tablet Take 1 tablet (20 mg total) by mouth daily. 90 tablet 3   Blood Glucose Monitoring Suppl (ONE TOUCH ULTRA 2) w/Device KIT Use to check blood sugars twice a day Dx E11.9 1  each 0   Cholecalciferol (VITAMIN D-3) 1000 UNITS CAPS Take 1,000 Units by mouth.     cloNIDine (CATAPRES) 0.1 MG tablet Take 1 tablet (0.1 mg total) by mouth 2 (two) times daily. 60 tablet 5   cyanocobalamin 1000 MCG tablet Take 1,000 mcg by mouth daily.     esomeprazole (NEXIUM) 40 MG capsule Take 1 capsule (40 mg total) by mouth daily at 12 noon. 90 capsule 1   gabapentin (NEURONTIN) 300 MG capsule Take 1 capsule (300 mg total) by mouth 3 (three) times daily. TAKE 1 CAPSULE BY MOUTH THREE TIMES A DAY 270 capsule 0   glucose blood (ONE TOUCH ULTRA TEST) test strip USE TO CHECK BLOOD SUGARS TWICE A DAY DX E11.9 100 each 1   Lancets (ONETOUCH ULTRASOFT) lancets USE TO HELP CHECK BLOOD SUGARS TWICE A DAY E11.9 100 each 1   meclizine (ANTIVERT) 25 MG tablet TAKE 0.5-1 TABLETS (12.5-25 MG TOTAL) BY MOUTH 2 (TWO) TIMES DAILY AS NEEDED FOR DIZZINESS. 30 tablet 0   meloxicam (MOBIC) 7.5 MG tablet Take 1 tablet (7.5 mg total) by mouth daily. 30 tablet 1   Multiple Vitamin (MULTIVITAMIN) tablet Take 1 tablet by mouth daily.     sitaGLIPtin (JANUVIA) 100 MG tablet Take 1 tablet (100 mg total) by mouth daily. 30 tablet 3   terconazole (TERAZOL 3) 0.8 % vaginal cream Place 1 applicator vaginally at bedtime. 20 g 0   No current facility-administered medications on file prior to visit.     BP 119/75    Pulse 75       Objective:   Physical Exam        Assessment & Plan:

## 2018-11-03 NOTE — Telephone Encounter (Signed)
Out bound call to Patient Whose husband called and requested that we call to assess Patient Patient reports that she is having blood clots in the back of eye.  Cant have eye surgery with elevated blood pressure.  Blood pressure was 150/90 and at the end of call was 170/78 and 139/92.. Patient transferred to [facility] office to make virtual appointment.   4  Meredith Phillips Female, 73 y.o., [birth year] MRN:  [id number] Phone:  [phone] (H) PCP:  Esperanza Richters, PA-C Primary [facility] [employer] Next Appt With Neurology 11/25/2018 at 10:00 AM Message from Lorayne Bender sent at 11/03/2018 9:49 AM EDT   Summary: high blood pressure   Pt's spouse calling - not with patient. States pt is having issue with high blood pressure 150/90 and she is having blood clots in the back of her eye. Pt's spouse would like a nurse to reach out to patient who is currently at home - grandson (44 years old) is with patient at this time. Pt can be reached at [phone]        Call History    Type Contact  11/03/2018 09:48 AM Phone (Incoming) [name],[name] (Emergency Contact)  Phone: [phone]  User: Lorayne Bender    Reason for Disposition . Systolic BP  >= 160 OR Diastolic >= 100  Answer Assessment - Initial Assessment Questions 1. BLOOD PRESSURE: "What is the blood pressure?" "Did you take at least two measurements 5 minutes apart?"     150/90  ,  2. ONSET: "When did you take your blood pressure?"     "  Pressure genrally 3. HOW: "How did you obtain the blood pressure?" (e.g., visiting nurse, automatic home BP monitor)    Automatic machine 4. HISTORY: "Do you have a history of high blood pressure?"    yes 5. MEDICATIONS: "Are you taking any medications for blood pressure?" "Have you missed any doses recently?"     Denies, Have missed some medicaion 6. OTHER SYMPTOMS: "Do you have any symptoms?" (e.g., headache, chest pain, blurred vision,  difficulty breathing, weakness)     Dizzy most of the time  7. PREGNANCY: "Is there any chance you are pregnant?" "When was your last menstrual period?"    Na  Protocols used: HIGH BLOOD PRESSURE-A-AH

## 2018-11-03 NOTE — Patient Instructions (Signed)
Your blood pressure is very well controlled presently. 2 reading while on visit with me was well controlled. Not sure about your 163/143 readings. We discussed making sure you take clonidine on tight regular schedule to avoid rebound hypertension. Take amlodipine daily. Clonidine 0.1 mg 8 am and 8 pm daily.   If your ha worsens, slight dizziness worsens or other neurologic type signs/symptoms then recommend ED evaluation.  For fatigue want you to get cbc and cmp tomorrow. Also want to check bp in our office when in for lab work.  Follow up date to be determined after lab and bp check in office.

## 2018-11-04 ENCOUNTER — Telehealth: Payer: Self-pay | Admitting: Medical

## 2018-11-04 ENCOUNTER — Ambulatory Visit: Payer: Self-pay

## 2018-11-04 NOTE — Telephone Encounter (Signed)
Received call from Dahlia Byes from Urgent Care for Covid-19 testing. For patient.  Telephone call placed  Left a message for return call.

## 2018-11-05 DIAGNOSIS — Z1159 Encounter for screening for other viral diseases: Secondary | ICD-10-CM | POA: Diagnosis not present

## 2018-11-09 ENCOUNTER — Other Ambulatory Visit: Payer: Self-pay | Admitting: Medical

## 2018-11-10 NOTE — Telephone Encounter (Signed)
Last entry is an error.disreguard.

## 2018-11-11 DIAGNOSIS — H40211 Acute angle-closure glaucoma, right eye: Secondary | ICD-10-CM | POA: Diagnosis not present

## 2018-11-11 DIAGNOSIS — H2511 Age-related nuclear cataract, right eye: Secondary | ICD-10-CM | POA: Diagnosis not present

## 2018-11-11 DIAGNOSIS — H401110 Primary open-angle glaucoma, right eye, stage unspecified: Secondary | ICD-10-CM | POA: Diagnosis not present

## 2018-11-12 DIAGNOSIS — Z961 Presence of intraocular lens: Secondary | ICD-10-CM | POA: Diagnosis not present

## 2018-11-12 DIAGNOSIS — Z48810 Encounter for surgical aftercare following surgery on the sense organs: Secondary | ICD-10-CM | POA: Diagnosis not present

## 2018-11-12 DIAGNOSIS — Z7952 Long term (current) use of systemic steroids: Secondary | ICD-10-CM | POA: Diagnosis not present

## 2018-11-12 DIAGNOSIS — Z79899 Other long term (current) drug therapy: Secondary | ICD-10-CM | POA: Diagnosis not present

## 2018-11-17 ENCOUNTER — Telehealth: Payer: Self-pay

## 2018-11-17 DIAGNOSIS — Z79899 Other long term (current) drug therapy: Secondary | ICD-10-CM | POA: Diagnosis not present

## 2018-11-17 DIAGNOSIS — Z7952 Long term (current) use of systemic steroids: Secondary | ICD-10-CM | POA: Diagnosis not present

## 2018-11-17 DIAGNOSIS — Z961 Presence of intraocular lens: Secondary | ICD-10-CM | POA: Diagnosis not present

## 2018-11-17 DIAGNOSIS — Z48810 Encounter for surgical aftercare following surgery on the sense organs: Secondary | ICD-10-CM | POA: Diagnosis not present

## 2018-11-17 NOTE — Telephone Encounter (Signed)
Spoke with the patient and she stated that she would love to do a virtual visit but she doesn't know what e-mail we could send the link to. She stated that she was going to call her husband at work and tell him to call us to set the visit up. Office number was provided.  If patients husband calls back please convert the office visit into a doxy.me visit

## 2018-11-18 ENCOUNTER — Other Ambulatory Visit: Payer: Self-pay | Admitting: Medical

## 2018-11-22 ENCOUNTER — Other Ambulatory Visit: Payer: Self-pay | Admitting: Medical

## 2018-11-22 DIAGNOSIS — M25511 Pain in right shoulder: Secondary | ICD-10-CM

## 2018-11-23 NOTE — Telephone Encounter (Signed)
Pt requesting refill for meloxicam prescribed by  PCP. Please advise.

## 2018-11-24 NOTE — Telephone Encounter (Signed)
Refilled meloxicam but explain to pt based on her age and reduced kidney function recommend to use sparingly. For example one tab every 3rd day or so only on days of moderate to sever pain since nsaids can effect kidney function.

## 2018-11-24 NOTE — Telephone Encounter (Signed)
Unable to get in contact with the patient to convert their office visit with Amy on 11/25/2018 into a doxy.me visit. I left a voicemail asking the patient to return my call. I advised that if the appt has not been confirmed by 5 pm today that it will be cancelled. Office number was provided.   If patient calls back please convert their office visit into a doxy.me or telephone visit.

## 2018-11-25 ENCOUNTER — Ambulatory Visit: Payer: PPO | Admitting: Family Medicine

## 2018-11-26 ENCOUNTER — Ambulatory Visit: Payer: Self-pay | Admitting: *Deleted

## 2018-11-26 NOTE — Telephone Encounter (Signed)
Pt called with complaints of stomach and back pain that started 2 weeks ago; she states that she previously spoke with Mackie Pai regarding this issue on 11/03/2018; the pt says that she is not feeling better; the pt is unsure of the name of ointment she used, but did get not have relief; pt states that she will do something else about er stomach pain, and will call back next week; she normally sees [employer]; pt transferred to [facility] for scheduling.   Reason for Disposition . [1] MODERATE back pain (e.g., interferes with normal activities) AND [2] present > 3 days  Protocols used: BACK PAIN-A-AH

## 2018-11-29 ENCOUNTER — Encounter: Payer: Self-pay | Admitting: Medical

## 2018-11-29 ENCOUNTER — Other Ambulatory Visit: Payer: Self-pay

## 2018-11-29 ENCOUNTER — Ambulatory Visit (INDEPENDENT_AMBULATORY_CARE_PROVIDER_SITE_OTHER): Payer: PPO | Admitting: Medical

## 2018-11-29 VITALS — BP 167/99 | HR 67

## 2018-11-29 DIAGNOSIS — M545 Low back pain: Secondary | ICD-10-CM

## 2018-11-29 DIAGNOSIS — I10 Essential (primary) hypertension: Secondary | ICD-10-CM | POA: Diagnosis not present

## 2018-11-29 DIAGNOSIS — K2100 Gastro-esophageal reflux disease with esophagitis, without bleeding: Secondary | ICD-10-CM

## 2018-11-29 DIAGNOSIS — R0789 Other chest pain: Secondary | ICD-10-CM

## 2018-11-29 DIAGNOSIS — G8929 Other chronic pain: Secondary | ICD-10-CM | POA: Diagnosis not present

## 2018-11-29 MED ORDER — FAMOTIDINE 20 MG PO TABS: 20.0000 mg | ORAL_TABLET | Freq: Two times a day (BID) | ORAL | 0 refills | Status: DC

## 2018-11-29 MED ORDER — METOPROLOL SUCCINATE ER 25 MG PO TB24: 25.0000 mg | ORAL_TABLET | Freq: Every day | ORAL | 0 refills | Status: DC

## 2018-11-29 MED ORDER — PREDNISONE 10 MG PO TABS: ORAL_TABLET | ORAL | 0 refills | Status: DC

## 2018-11-29 NOTE — Telephone Encounter (Signed)
Virtual visit scheduled.  

## 2018-11-29 NOTE — Patient Instructions (Addendum)
For htn, I am adding metoprolol to your current regimen.    For gerd history and recent upset stomach, will rx famotidine as you report one otc med recntly resolved upset stomach.  For chronic back pain and rt si area pain, stop meloxicam presently and for next 4 days rx 4 day taper prednisone.  From chronic intermittent chest wall pain but not current, I suspect more msk pain. No current pain now. Will get ekg on follow up this Friday. May refer you to cardiology. If any increase chest pain or recurrent then ED evaluation.  For mild intermittent dizziness can use meclizine. For worst symptoms then ED evaluation.  Follow up in this Friday or as needed

## 2018-11-29 NOTE — Progress Notes (Signed)
Subjective:    Patient ID: Meredith Phillips, female    DOB: 1945/06/21, 73 y.o.   MRN: [id number]  HPI  Virtual Visit via Video Note  I connected with Meredith Phillips on 11/29/18 at  3:40 PM EDT by a video enabled telemedicine application and verified that I am speaking with the correct person using two identifiers.  Location: Patient: home Provider: office.   I discussed the limitations of evaluation and management by telemedicine and the availability of in person appointments. The patient expressed understanding and agreed to proceed.   History of Present Illness:   Pt states her lower back pain with some rt si area pain but no pain radiating down her buttock or down her leg. When she walks the pain increases.  Pt states remote hx of rt upper side chest pain on and off after fall years ago. She states that this led to prolonged work up in ED. He echo was normal 03-26-2018. She states still has intermittent rare pain with upper ext movement and pushing. Last time she had pain was about one week. Last time it occurred was pushing a love seat.  Pt has rx of meloxicam. I refilled that the other day at low dose.  Pt states her stomach was upset the other day. She took tablet otc med for stomach acid and it helped her. Recommend try famotidine.  Pt bp is on higher side today. Pt on amlodipine and clonidine. No skipped dosing of either.  Observations/Objective:  General-no acute distress, pleasant, oriented. Lungs- on inspection lungs appear unlabored. Neck- no tracheal deviation or jvd on inspection. Neuro- gross motor function appears intact. Back-mid lumbar. Some rt sides. neuo- gross motor/sensory fxn intact. Symmetric smile. Finger to nose intact. No hand drift.  Assessment and Plan: For htn, I am adding metoprolol to your current regimen.    For gerd history and recent upset stomach, will rx famotidine as you report one otc med recntly resolved upset stomach.  For chronic  back pain and rt si area pain, stop meloxicam presently and for next 4 days rx 4 day taper prednisone.  From chronic intermittent chest wall pain but not current, I suspect more msk pain. No current pain now. Will get ekg on follow up this Friday. May refer you to cardiology. If any increase chest pain or recurrent then ED evaluation.  For mild intermittent dizziness can use meclizine. For worst symptoms then ED evaluation.  Follow up in this Friday or as needed  Follow Up Instructions:    I discussed the assessment and treatment plan with the patient. The patient was provided an opportunity to ask questions and all were answered. The patient agreed with the plan and demonstrated an understanding of the instructions.   The patient was advised to call back or seek an in-person evaluation if the symptoms worsen or if the condition fails to improve as anticipated.  I provided 25 +minutes of non-face-to-face time during this encounter.   Mackie Pai, PA-C   Review of Systems  Constitutional: Negative for chills, diaphoresis, fatigue and fever.  HENT: Negative for congestion, ear discharge, sinus pressure and sinus pain.   Respiratory: Negative for cough, chest tightness, shortness of breath and wheezing.   Cardiovascular: Negative for chest pain and palpitations.  Gastrointestinal: Positive for abdominal pain. Negative for nausea and vomiting.  Genitourinary: Negative for difficulty urinating, dysuria, flank pain, frequency and pelvic pain.  Musculoskeletal: Positive for back pain. Negative for myalgias and neck stiffness.  See hpi regarding hx of chest wall  pain.  Skin: Negative for rash.  Neurological: Negative for dizziness, seizures, speech difficulty, weakness and light-headedness.       Some very faint dizziness now but hx of this in past. No gross motor or sensory function deficits.  Hematological: Negative for adenopathy. Does not bruise/bleed easily.   Psychiatric/Behavioral: Negative for behavioral problems and confusion.   Past Medical History:  Diagnosis Date  . Arthritis   . Benign essential HTN 02/18/2015  . Cellulitis 02/18/2015  . Chicken pox   . Chronic fatigue   . Dementia ([place])   . Depression    since last 04/2018 daughter passed.    . Diabetes mellitus without complication ([place])   . Diabetic peripheral neuropathy ([place])   . GERD (gastroesophageal reflux disease)   . Hypercholesteremia   . Hypertension   . Liver disease   . Polyp of colon      Social History   Socioeconomic History  . Marital status: Married    Spouse name: Not on file  . Number of children: 5  . Years of education: 22  . Highest education level: Not on file  Occupational History  . Occupation: Retired  Scientific laboratory technician  . Financial resource strain: Not on file  . Food insecurity    Worry: Not on file    Inability: Not on file  . Transportation needs    Medical: Not on file    Non-medical: Not on file  Tobacco Use  . Smoking status: Never Smoker  . Smokeless tobacco: Never Used  Substance and Sexual Activity  . Alcohol use: No  . Drug use: No  . Sexual activity: Not on file  Lifestyle  . Physical activity    Days per week: Not on file    Minutes per session: Not on file  . Stress: Not on file  Relationships  . Social Herbalist on phone: Not on file    Gets together: Not on file    Attends religious service: Not on file    Active member of club or organization: Not on file    Attends meetings of clubs or organizations: Not on file    Relationship status: Not on file  . Intimate partner violence    Fear of current or ex partner: Not on file    Emotionally abused: Not on file    Physically abused: Not on file    Forced sexual activity: Not on file  Other Topics Concern  . Not on file  Social History Narrative   Fun: paint, word search   Feels safe where she is living and no concern for abuse   Denies religious beliefs  effecting healthcare.     Past Surgical History:  Procedure Laterality Date  . APPENDECTOMY    . HERNIA REPAIR    . KNEE SURGERY    . ROTATOR CUFF REPAIR    . TONSILLECTOMY      Family History  Problem Relation Age of Onset  . Healthy Mother   . Healthy Father   . Healthy Paternal Grandmother   . Healthy Paternal Grandfather     Allergies  Allergen Reactions  . Codeine Other (See Comments)    Woke up the next morning and did not know where she was  . Other     Doesn't like to take pain medication due to hallucinations.  [name] Shellfish Allergy     Current Outpatient Medications on File Prior to  Visit  Medication Sig Dispense Refill  . amLODipine (NORVASC) 10 MG tablet Take 1 tablet (10 mg total) by mouth daily. 90 tablet 1  . aspirin EC 81 MG tablet Take 81 mg by mouth daily.    [name] atorvastatin (LIPITOR) 20 MG tablet Take 1 tablet (20 mg total) by mouth daily. 90 tablet 3  . Blood Glucose Monitoring Suppl (ONE TOUCH ULTRA 2) w/Device KIT Use to check blood sugars twice a day Dx E11.9 1 each 0  . Cholecalciferol (VITAMIN D-3) 1000 UNITS CAPS Take 1,000 Units by mouth.    . cloNIDine (CATAPRES) 0.1 MG tablet Take 1 tablet (0.1 mg total) by mouth 2 (two) times daily. 60 tablet 5  . cyanocobalamin 1000 MCG tablet Take 1,000 mcg by mouth daily.    [name] esomeprazole (NEXIUM) 40 MG capsule Take 1 capsule (40 mg total) by mouth daily at 12 noon. 90 capsule 1  . gabapentin (NEURONTIN) 300 MG capsule Take 1 capsule (300 mg total) by mouth 3 (three) times daily. TAKE 1 CAPSULE BY MOUTH THREE TIMES A DAY 270 capsule 0  . glucose blood (ONE TOUCH ULTRA TEST) test strip USE TO CHECK BLOOD SUGARS TWICE A DAY DX E11.9 100 each 1  . Lancets (ONETOUCH ULTRASOFT) lancets USE TO HELP CHECK BLOOD SUGARS TWICE A DAY E11.9 100 each 1  . meclizine (ANTIVERT) 25 MG tablet TAKE 1/2 TO 1 TABLET BY MOUTH TWICE A DAY AS NEEDED FOR DIZZINESS 30 tablet 0  . meloxicam (MOBIC) 7.5 MG tablet TAKE 1 TABLET BY MOUTH  EVERY DAY 30 tablet 0  . Multiple Vitamin (MULTIVITAMIN) tablet Take 1 tablet by mouth daily.    . sitaGLIPtin (JANUVIA) 100 MG tablet Take 1 tablet (100 mg total) by mouth daily. 90 tablet 1  . terconazole (TERAZOL 3) 0.8 % vaginal cream Place 1 applicator vaginally at bedtime. 20 g 0   No current facility-administered medications on file prior to visit.     BP (!) 167/99   Pulse 67       Objective:   Physical Exam        Assessment & Plan:

## 2018-11-30 DIAGNOSIS — H34831 Tributary (branch) retinal vein occlusion, right eye, with macular edema: Secondary | ICD-10-CM | POA: Diagnosis not present

## 2018-12-06 ENCOUNTER — Other Ambulatory Visit: Payer: Self-pay | Admitting: Medical

## 2018-12-20 ENCOUNTER — Other Ambulatory Visit: Payer: Self-pay | Admitting: Medical

## 2018-12-20 NOTE — Telephone Encounter (Signed)
Pts husband called stating the pt does not have any of her blood pressure medication left. Pts husband states she ran out of the medication on Thursday. Pts husband states the pharmacy has been trying to get in contact with office. Please advise.

## 2018-12-24 ENCOUNTER — Ambulatory Visit: Payer: Self-pay

## 2018-12-24 DIAGNOSIS — H348311 Tributary (branch) retinal vein occlusion, right eye, with retinal neovascularization: Secondary | ICD-10-CM | POA: Diagnosis not present

## 2018-12-24 NOTE — Telephone Encounter (Signed)
Outgoing call to Patient who states that she has been dizzy lately.  Dizzy when walking, movement of head and standing.  Rates it moderate.  Onset was 3 weeks ago.   aggravating factors are when standing up.   Heart rate was 55 during time of call.  Provided care advice and reviewed protocol. Appt.  Scheduled for Monday July 13@ 09:30am. Patien voices understanding..   Reason for Disposition . [1] MODERATE dizziness (e.g., interferes with normal activities) AND [2] has NOT been evaluated by physician for this  (Exception: dizziness caused by heat exposure, sudden standing, or poor fluid intake)  Answer Assessment - Initial Assessment Questions 1. DESCRIPTION: "Describe your dizziness."  walking , think I walking straight but not , movement of headweak when standing up.  2. LIGHTHEADED: "Do you feel lightheaded?" (e.g., somewhat faint, woozy, weak upon standing)     *No Answer* 3. VERTIGO: "Do you feel like either you or the room is spinning or tilting?" (i.e. vertigo)     *No Answer* 4. SEVERITY: "How bad is it?"  "Do you feel like you are going to faint?" "Can you stand and walk?"   - MILD - walking normally   - MODERATE - interferes with normal activities (e.g., work, school)    - SEVERE - unable to stand, requires support to walk, feels like passing out now.      moderate 5. ONSET:  "When did the dizziness begin?"     3 weeks ago 6. AGGRAVATING FACTORS: "Does anything make it worse?" (e.g., standing, change in head position)     When  I  Stand up.   7. HEART RATE: "Can you tell me your heart rate?" "How many beats in 15 seconds?"  (Note: not all patients can do this)       55 8. CAUSE: "What do you think is causing the dizziness?"     *No Answer* 9. RECURRENT SYMPTOM: "Have you had dizziness before?" If so, ask: "When was the last time?" "What happened that time?"     *No Answer* 10. OTHER SYMPTOMS: "Do you have any other symptoms?" (e.g., fever, chest pain, vomiting, diarrhea,  bleeding)       denies 11. PREGNANCY: "Is there any chance you are pregnant?" "When was your last menstrual period?"       na  Protocols used: DIZZINESS [facility]

## 2018-12-26 NOTE — Progress Notes (Signed)
Subjective:    Patient ID: Meredith Phillips, female    DOB: 01-Dec-1945, 73 y.o.   MRN: [id number]  HPI Pt in for recent dizziness. On last virtual video visit she did not episodes transient intermittent mild dizziness as well.  On 12/24/2018 nurse triage note stated. "Outgoing call to Patient who states that she has been dizzy lately.  Dizzy when walking, movement of head and standing.  Rates it moderate.  Onset was 3 weeks ago.   aggravating factors are when standing up.   Heart rate was 55 during time of call.  Provided care advice and reviewed protocol. Appt.  Scheduled for Monday July 13@ 09:30am. Patien voices understanding."  Pt tells mild light headed and spinning even seated but on standing it is worse. She states after standing for about 30 seconds the spinning/light headed sensation improves but does not resolve.  Last visit her bp was 167/99. She was on amlodipine 10 mg daily, clonidine, 0.1 mg twice daily and  I added low dose toprol xl 25 mg daily.   Pt is on [place] for diabetes. Her last a1c was 5.6. Pt does have supplies to check her sugar. Pt has   Regarding plan on last virtual visit for transient dizziness had planned to get ekg on follow up. Also advised to use meclizine if needed.  With dizziness as described above not reported any gross motor or sensory function deficits. When has dizziness. Not associated with palpitation or chest pain.   Pt still has back pain as she had on last visit. She points more rt side area but some midspine area as well.  On today he ekg showed left bundle branch. Pt does not occasional chest pain that is intermittent. Pt states since last visit she has some pain for about. She thinks has been for about a month.     Review of Systems  Constitutional: Negative for chills, fatigue and fever.  HENT: Negative for congestion, facial swelling, sinus pressure, sinus pain and trouble swallowing.   Respiratory: Negative for cough, shortness of  breath and wheezing.   Cardiovascular: Negative for chest pain and palpitations.  Gastrointestinal: Negative for abdominal pain.  Endocrine: Negative for polydipsia, polyphagia and polyuria.  Genitourinary: Negative for dyspareunia, dysuria, frequency and urgency.  Skin: Negative for rash.  Neurological: Negative for syncope, weakness and numbness.       See hpi on dizziness.  Hematological: Negative for adenopathy.    Past Medical History:  Diagnosis Date  . Arthritis   . Benign essential HTN 02/18/2015  . Cellulitis 02/18/2015  . Chicken pox   . Chronic fatigue   . Dementia ([place])   . Depression    since last 04/2018 daughter passed.    . Diabetes mellitus without complication ([place])   . Diabetic peripheral neuropathy ([place])   . GERD (gastroesophageal reflux disease)   . Hypercholesteremia   . Hypertension   . Liver disease   . Polyp of colon      Social History   Socioeconomic History  . Marital status: Married    Spouse name: Not on file  . Number of children: 5  . Years of education: 51  . Highest education level: Not on file  Occupational History  . Occupation: Retired  Scientific laboratory technician  . Financial resource strain: Not on file  . Food insecurity    Worry: Not on file    Inability: Not on file  . Transportation needs    Medical: Not on file  Non-medical: Not on file  Tobacco Use  . Smoking status: Never Smoker  . Smokeless tobacco: Never Used  Substance and Sexual Activity  . Alcohol use: No  . Drug use: No  . Sexual activity: Not on file  Lifestyle  . Physical activity    Days per week: Not on file    Minutes per session: Not on file  . Stress: Not on file  Relationships  . Social Herbalist on phone: Not on file    Gets together: Not on file    Attends religious service: Not on file    Active member of club or organization: Not on file    Attends meetings of clubs or organizations: Not on file    Relationship status: Not on file  . Intimate  partner violence    Fear of current or ex partner: Not on file    Emotionally abused: Not on file    Physically abused: Not on file    Forced sexual activity: Not on file  Other Topics Concern  . Not on file  Social History Narrative   Fun: paint, word search   Feels safe where she is living and no concern for abuse   Denies religious beliefs effecting healthcare.     Past Surgical History:  Procedure Laterality Date  . APPENDECTOMY    . HERNIA REPAIR    . KNEE SURGERY    . ROTATOR CUFF REPAIR    . TONSILLECTOMY      Family History  Problem Relation Age of Onset  . Healthy Mother   . Healthy Father   . Healthy Paternal Grandmother   . Healthy Paternal Grandfather     Allergies  Allergen Reactions  . Codeine Other (See Comments)    Woke up the next morning and did not know where she was  . Other     Doesn't like to take pain medication due to hallucinations.  . Shellfish Allergy     Current Outpatient Medications on File Prior to Visit  Medication Sig Dispense Refill  . amLODipine (NORVASC) 10 MG tablet Take 1 tablet (10 mg total) by mouth daily. 90 tablet 1  . aspirin EC 81 MG tablet Take 81 mg by mouth daily.    [name] atorvastatin (LIPITOR) 20 MG tablet Take 1 tablet (20 mg total) by mouth daily. 90 tablet 3  . Blood Glucose Monitoring Suppl (ONE TOUCH ULTRA 2) w/Device KIT Use to check blood sugars twice a day Dx E11.9 1 each 0  . Cholecalciferol (VITAMIN D-3) 1000 UNITS CAPS Take 1,000 Units by mouth.    . cloNIDine (CATAPRES) 0.1 MG tablet Take 1 tablet (0.1 mg total) by mouth 2 (two) times daily. 60 tablet 5  . cyanocobalamin 1000 MCG tablet Take 1,000 mcg by mouth daily.    [name] esomeprazole (NEXIUM) 40 MG capsule Take 1 capsule (40 mg total) by mouth daily at 12 noon. 90 capsule 1  . famotidine (PEPCID) 20 MG tablet TAKE 1 TABLET BY MOUTH TWICE A DAY 30 tablet 0  . gabapentin (NEURONTIN) 300 MG capsule Take 1 capsule (300 mg total) by mouth 3 (three) times daily.  TAKE 1 CAPSULE BY MOUTH THREE TIMES A DAY 270 capsule 0  . glucose blood (ONE TOUCH ULTRA TEST) test strip USE TO CHECK BLOOD SUGARS TWICE A DAY DX E11.9 100 each 1  . Lancets (ONETOUCH ULTRASOFT) lancets USE TO HELP CHECK BLOOD SUGARS TWICE A DAY E11.9 100 each 1  .  meclizine (ANTIVERT) 25 MG tablet TAKE 1/2 TO 1 TABLET BY MOUTH TWICE A DAY AS NEEDED FOR DIZZINESS 30 tablet 0  . meloxicam (MOBIC) 7.5 MG tablet TAKE 1 TABLET BY MOUTH EVERY DAY 30 tablet 0  . metoprolol succinate (TOPROL-XL) 25 MG 24 hr tablet TAKE 1 TABLET BY MOUTH EVERY DAY 30 tablet 0  . Multiple Vitamin (MULTIVITAMIN) tablet Take 1 tablet by mouth daily.    . predniSONE (DELTASONE) 10 MG tablet 4 tabs po day 1, 3 tab po day 2, 2 tab po day 3, 1 tab po day 4. 10 tablet 0  . sitaGLIPtin (JANUVIA) 100 MG tablet Take 1 tablet (100 mg total) by mouth daily. 90 tablet 1  . terconazole (TERAZOL 3) 0.8 % vaginal cream Place 1 applicator vaginally at bedtime. 20 g 0   No current facility-administered medications on file prior to visit.     There were no vitals taken for this visit.      Objective:   Physical Exam   General Mental Status- Alert. General Appearance- Not in acute distress.   Skin General: Color- Normal Color. Moisture- Normal Moisture.  Neck Carotid Arteries- Normal color. Moisture- Normal Moisture. No carotid bruits. No JVD.  Chest and Lung Exam Auscultation: Breath Sounds:-Normal.  Cardiovascular Auscultation:Rythm- Regular. Murmurs & Other Heart Sounds:Auscultation of the heart reveals- No Murmurs.  Abdomen Inspection:-Inspeection Normal. Palpation/Percussion:Note:No mass. Palpation and Percussion of the abdomen reveal- Non Tender, Non Distended + BS, no rebound or guarding.    Neurologic Cranial Nerve exam:- CN III-XII intact(No nystagmus), symmetric smile. Drift Test:- No drift. Romberg Exam:- Negative.  Heal to Toe Gait exam:-Normal. Finger to Nose:- Normal/Intact Strength:- 5/5 equal  and symmetric strength both upper and lower extremities. On lying on back turning hea to rt and left. On change of position.      Assessment & Plan:  You do appear to have new left bundle branch block on ekg today. With daily recent dizziness, dyspnea 1 week ago, hx of transient atypical chest pain and some risk factor, I do think best to be evaluated in the [facility] ED. I did call downstairs and talked with provider working presently.  You can do a1c labs later/next week.  ED will evaluate today.  Start checking bp and sugars daily as we discussed.  Follow up date to be determined after ED evaluation.  If there work up is negative then would plan to have you also see cariologist as out patient.  40 minutes spent with pt 50% of time spent counseling on dx, tx and plan to refer to ED. Answered pt questions as well.  Mackie Pai, PA-C

## 2018-12-27 ENCOUNTER — Encounter (HOSPITAL_BASED_OUTPATIENT_CLINIC_OR_DEPARTMENT_OTHER): Payer: Self-pay | Admitting: *Deleted

## 2018-12-27 ENCOUNTER — Other Ambulatory Visit: Payer: Self-pay

## 2018-12-27 ENCOUNTER — Emergency Department (HOSPITAL_BASED_OUTPATIENT_CLINIC_OR_DEPARTMENT_OTHER)
Admission: EM | Admit: 2018-12-27 | Discharge: 2018-12-27 | Disposition: A | Discharge status: Home / Self Care | Payer: PPO | Attending: Emergency Medicine | Admitting: Emergency Medicine

## 2018-12-27 ENCOUNTER — Encounter: Payer: Self-pay | Admitting: Medical

## 2018-12-27 ENCOUNTER — Ambulatory Visit (INDEPENDENT_AMBULATORY_CARE_PROVIDER_SITE_OTHER): Payer: PPO | Admitting: Medical

## 2018-12-27 ENCOUNTER — Emergency Department (HOSPITAL_BASED_OUTPATIENT_CLINIC_OR_DEPARTMENT_OTHER): Payer: PPO

## 2018-12-27 VITALS — BP 110/70 | HR 81 | Temp 98.5°F | Resp 16 | Ht 63.0 in | Wt 140.2 lb

## 2018-12-27 DIAGNOSIS — E876 Hypokalemia: Secondary | ICD-10-CM | POA: Insufficient documentation

## 2018-12-27 DIAGNOSIS — I10 Essential (primary) hypertension: Secondary | ICD-10-CM | POA: Insufficient documentation

## 2018-12-27 DIAGNOSIS — R42 Dizziness and giddiness: Secondary | ICD-10-CM

## 2018-12-27 DIAGNOSIS — E119 Type 2 diabetes mellitus without complications: Secondary | ICD-10-CM | POA: Diagnosis not present

## 2018-12-27 DIAGNOSIS — F0390 Unspecified dementia without behavioral disturbance: Secondary | ICD-10-CM | POA: Insufficient documentation

## 2018-12-27 DIAGNOSIS — E1140 Type 2 diabetes mellitus with diabetic neuropathy, unspecified: Secondary | ICD-10-CM | POA: Insufficient documentation

## 2018-12-27 DIAGNOSIS — E118 Type 2 diabetes mellitus with unspecified complications: Secondary | ICD-10-CM

## 2018-12-27 DIAGNOSIS — R5383 Other fatigue: Secondary | ICD-10-CM | POA: Diagnosis not present

## 2018-12-27 DIAGNOSIS — K219 Gastro-esophageal reflux disease without esophagitis: Secondary | ICD-10-CM

## 2018-12-27 DIAGNOSIS — R5381 Other malaise: Secondary | ICD-10-CM | POA: Diagnosis not present

## 2018-12-27 LAB — URINALYSIS, ROUTINE W REFLEX MICROSCOPIC
Glucose, UA: NEGATIVE mg/dL
Hgb urine dipstick: NEGATIVE
Ketones, ur: 15 mg/dL — AB
Nitrite: NEGATIVE
Protein, ur: 100 mg/dL — AB
Specific Gravity, Urine: 1.015 (ref 1.005–1.030)
pH: 7 (ref 5.0–8.0)

## 2018-12-27 LAB — COMPREHENSIVE METABOLIC PANEL
ALT: 10 U/L (ref 0–44)
AST: 16 U/L (ref 15–41)
Albumin: 4.2 g/dL (ref 3.5–5.0)
Alkaline Phosphatase: 63 U/L (ref 38–126)
Anion gap: 10 (ref 5–15)
BUN: 7 mg/dL — ABNORMAL LOW (ref 8–23)
CO2: 20 mmol/L — ABNORMAL LOW (ref 22–32)
Calcium: 10.3 mg/dL (ref 8.9–10.3)
Chloride: 110 mmol/L (ref 98–111)
Creatinine, Ser: 0.85 mg/dL (ref 0.44–1.00)
GFR calc Af Amer: >60 mL/min (ref >60)
GFR calc non Af Amer: >60 mL/min (ref >60)
Glucose, Bld: 119 mg/dL — ABNORMAL HIGH (ref 70–99)
Potassium: 2.9 mmol/L — ABNORMAL LOW (ref 3.5–5.1)
Sodium: 140 mmol/L (ref 135–145)
Total Bilirubin: 0.6 mg/dL (ref 0.3–1.2)
Total Protein: 8.2 g/dL — ABNORMAL HIGH (ref 6.5–8.1)

## 2018-12-27 LAB — CBC
HCT: 41.8 % (ref 36.0–46.0)
Hemoglobin: 13.1 g/dL (ref 12.0–15.0)
MCH: 28.4 pg (ref 26.0–34.0)
MCHC: 31.3 g/dL (ref 30.0–36.0)
MCV: 90.5 fL (ref 80.0–100.0)
Platelets: 369 10*3/uL (ref 150–400)
RBC: 4.62 MIL/uL (ref 3.87–5.11)
RDW: 12.9 % (ref 11.5–15.5)
WBC: 7.1 10*3/uL (ref 4.0–10.5)
nRBC: 0 % (ref 0.0–0.2)

## 2018-12-27 LAB — URINALYSIS, MICROSCOPIC (REFLEX)

## 2018-12-27 MED ORDER — POTASSIUM CHLORIDE CRYS ER 20 MEQ PO TBCR
40.0000 meq | EXTENDED_RELEASE_TABLET | Freq: Once | ORAL | Status: AC
  Administered 2018-12-27: 40 meq via ORAL
  Filled 2018-12-27: qty 2

## 2018-12-27 NOTE — ED Triage Notes (Signed)
For several weeks she has had fatigue. No pain.

## 2018-12-27 NOTE — ED Notes (Signed)
Pt states she fell several months ago and know things in her chest have changed  Very vague w complaint

## 2018-12-27 NOTE — Patient Instructions (Signed)
You do appear to have new left bundle branch block on ekg today. With daily recent dizziness, dyspnea 1 week ago, hx of transient atypical chest pain and some risk factor, I do think best to be evaluated in the [facility] ED. I did call downstairs and talked with provider working presently.  You can do a1c labs later/next week.  ED will evaluate today.  Start checking bp and sugars daily as we discussed.  Follow up date to be determined after ED evaluation.  If there work up is negative then would plan to have you also see cariologist as out patient.

## 2018-12-27 NOTE — Discharge Instructions (Addendum)
You were evaluated in the Emergency Department and after careful evaluation, we did not find any emergent condition requiring admission or further testing in the hospital.  Your symptoms today seem to be due to hypokalemia, or low potassium.  As discussed, please increase the amount of potassium in your diet and follow-up with your regular doctor.  Please return to the Emergency Department if you experience any worsening of your condition.  We encourage you to follow up with a primary care provider.  Thank you for allowing [place] to be a part of your care.

## 2018-12-27 NOTE — ED Provider Notes (Signed)
[facility] Emergency Department Provider Note MRN:  [id number]  Arrival date & time: 12/27/18     Chief Complaint   Fatigue   History of Present Illness   Meredith Phillips is a 73 y.o. year-old female with a history of dementia, diabetes, hypertension presenting to the ED with chief complaint of fatigue.  3 weeks of increased fatigue.  Denies pain, denies fever, no cough.  Intermittent shortness of breath but has not occurred in several weeks.  Denies dysuria, no numbness or weakness to the arms or legs.  I was unable to obtain an accurate HPI, PMH, or ROS due to the patient's dementia.  Review of Systems  Positive for fatigue.  Patient's Health History    Past Medical History:  Diagnosis Date  . Arthritis   . Benign essential HTN 02/18/2015  . Cellulitis 02/18/2015  . Chicken pox   . Chronic fatigue   . Dementia ([facility])   . Depression    since last 04/2018 daughter passed.    . Diabetes mellitus without complication ([facility])   . Diabetic peripheral neuropathy ([facility])   . GERD (gastroesophageal reflux disease)   . Hypercholesteremia   . Hypertension   . Liver disease   . Polyp of colon     Past Surgical History:  Procedure Laterality Date  . APPENDECTOMY    . HERNIA REPAIR    . KNEE SURGERY    . ROTATOR CUFF REPAIR    . TONSILLECTOMY      Family History  Problem Relation Age of Onset  . Healthy Mother   . Healthy Father   . Healthy Paternal Grandmother   . Healthy Paternal Grandfather     Social History   Socioeconomic History  . Marital status: Married    Spouse name: Not on file  . Number of children: 5  . Years of education: 49  . Highest education level: Not on file  Occupational History  . Occupation: Retired  Engineer, production  . Financial resource strain: Not on file  . Food insecurity    Worry: Not on file    Inability: Not on file  . Transportation needs    Medical: Not on file    Non-medical: Not on file  Tobacco Use  .  Smoking status: Never Smoker  . Smokeless tobacco: Never Used  Substance and Sexual Activity  . Alcohol use: No  . Drug use: No  . Sexual activity: Not on file  Lifestyle  . Physical activity    Days per week: Not on file    Minutes per session: Not on file  . Stress: Not on file  Relationships  . Social Musician on phone: Not on file    Gets together: Not on file    Attends religious service: Not on file    Active member of club or organization: Not on file    Attends meetings of clubs or organizations: Not on file    Relationship status: Not on file  . Intimate partner violence    Fear of current or ex partner: Not on file    Emotionally abused: Not on file    Physically abused: Not on file    Forced sexual activity: Not on file  Other Topics Concern  . Not on file  Social History Narrative   Fun: paint, word search   Feels safe where she is living and no concern for abuse   Denies religious beliefs effecting healthcare.  Physical Exam  Vital Signs and Nursing Notes reviewed Vitals:   12/27/18 1059  BP: 133/86  Pulse: 70  Resp: 20  Temp: 98.2 F (36.8 C)  SpO2: 100%    CONSTITUTIONAL: Well-appearing, NAD NEURO:  Alert and oriented x 3, mildly confused EYES:  eyes equal and reactive ENT/NECK:  no LAD, no JVD CARDIO: Regular rate, well-perfused, normal S1 and S2 PULM:  CTAB no wheezing or rhonchi GI/GU:  normal bowel sounds, non-distended, non-tender MSK/SPINE:  No gross deformities, no edema SKIN:  no rash, atraumatic PSYCH:  Appropriate speech and behavior  Diagnostic and Interventional Summary    EKG Interpretation  Date/Time:  Monday December 27 2018 11:47:14 EDT Ventricular Rate:  60 PR Interval:    QRS Duration: 112 QT Interval:  463 QTC Calculation: 463 R Axis:   3 Text Interpretation:  Sinus rhythm Left ventricular hypertrophy Anterior infarct, old Confirmed by Kennis Carina [phone]) on 12/27/2018 12:55:33 PM      Labs Reviewed   COMPREHENSIVE METABOLIC PANEL - Abnormal; Notable for the following components:      Result Value   Potassium 2.9 (*)    CO2 20 (*)    Glucose, Bld 119 (*)    BUN 7 (*)    Total Protein 8.2 (*)    All other components within normal limits  URINALYSIS, ROUTINE W REFLEX MICROSCOPIC - Abnormal; Notable for the following components:   Bilirubin Urine SMALL (*)    Ketones, ur 15 (*)    Protein, ur 100 (*)    Leukocytes,Ua TRACE (*)    All other components within normal limits  URINALYSIS, MICROSCOPIC (REFLEX) - Abnormal; Notable for the following components:   Bacteria, UA RARE (*)    All other components within normal limits  CBC    DG Chest [place] 1 View  Final Result      Medications  potassium chloride SA (K-DUR) CR tablet 40 mEq (40 mEq Oral Given 12/27/18 1238)     Procedures Critical Care  ED Course and Medical Decision Making  I have reviewed the triage vital signs and the nursing notes.  Pertinent labs & imaging results that were available during my care of the patient were reviewed by me and considered in my medical decision making (see below for details).  Considering UTI, metabolic disarray in this 73 year old female complaining of generalized weakness, fatigue.  No family at bedside, no answer when attempted to call family members for more information, will try again soon.  Work-up is pending.  Work-up reveals hypokalemia, which could explain patient's fatigue and weakness.  Patient was able to ambulate here in the emergency department without issue.  She continues to have normal vital signs and a normal neurological exam, no indication for admission, no indication for CNS imaging.  Case was discussed with patient's spouse and both of her daughters and everyone is agreeable with discharge and PCP follow-up.  After the discussed management above, the patient was determined to be safe for discharge.  The patient was in agreement with this plan and all questions regarding  their care were answered.  ED return precautions were discussed and the patient will return to the ED with any significant worsening of condition.  Elmer Sow. Pilar Plate, MD [facility] [email or web address]  Final Clinical Impressions(s) / ED Diagnoses     ICD-10-CM   1. Hypokalemia  E87.6   2. Malaise and fatigue  R53.81 DG Chest [place] 1 View   R53.83 DG Chest  [place]    ED Discharge Orders    None         Maudie Flakes, MD 12/27/18 1325

## 2019-01-03 DIAGNOSIS — B351 Tinea unguium: Secondary | ICD-10-CM | POA: Diagnosis not present

## 2019-01-03 DIAGNOSIS — E119 Type 2 diabetes mellitus without complications: Secondary | ICD-10-CM | POA: Diagnosis not present

## 2019-01-03 DIAGNOSIS — M79672 Pain in left foot: Secondary | ICD-10-CM | POA: Diagnosis not present

## 2019-01-03 DIAGNOSIS — M79671 Pain in right foot: Secondary | ICD-10-CM | POA: Diagnosis not present

## 2019-01-05 ENCOUNTER — Other Ambulatory Visit: Payer: Self-pay | Admitting: Medical

## 2019-01-05 DIAGNOSIS — H34831 Tributary (branch) retinal vein occlusion, right eye, with macular edema: Secondary | ICD-10-CM | POA: Diagnosis not present

## 2019-01-10 ENCOUNTER — Ambulatory Visit: Payer: Self-pay | Admitting: Medical

## 2019-01-10 NOTE — Telephone Encounter (Signed)
Pt called in c/o being dizzy for the last week or two.   My husband is at work now.   I'll have to wait until he gets home before I go to the emergency room.    "I forget about things so easy".    He gets home from work at 5:00 PM.    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   "My grandson is here with me".   He will be 75 yrs old in November.    "Don't you worry about me".    "I'll be ok".   "I'm a tough old woman" was her responses when I asked her to call 911 to get to the ED.    "I will have my grandson help me".    "He would be glad to call 911 on me".     She is going to call her husband at work and see if he can get off of work 3 hours early and come take her to the ED.  I sent these notes to [employer] office.   I called into the office and let them know what was going on and they asked that she go to the ED.    I let the pt know this.    See the rest of the notes.     Reason for Disposition . [1] MODERATE dizziness (e.g., interferes with normal activities) AND [2] has been evaluated by physician for this    C/o being dizzy to the point she can't walk without holding onto things so she won't fall.    Per chart she has LBBB.   Sent to ED and found to have hypokalemia.    Needs evaluating prior to 3 days.  Answer Assessment - Initial Assessment Questions 1. DESCRIPTION: "Describe your dizziness."     "i'm dizzy".    "I can't walk straight".  "I have to be careful about not falling".    "My back hurts so bad".   "I told Ramon Dredge about my back".    2. LIGHTHEADED: "Do you feel lightheaded?" (e.g., somewhat faint, woozy, weak upon standing)     Been a week or 2 being dizzy.   "I don't feel good".   "I feel bad".   "I know I'm older but I don't know what's happening with me".   "I'm feeling worse and I don't know why".    3. VERTIGO: "Do you feel like either you or the room is spinning or tilting?" (i.e. vertigo)     No 4. SEVERITY: "How bad is it?"  "Do you feel like you are  going to faint?" "Can you stand and walk?"   - MILD - walking normally   - MODERATE - interferes with normal activities (e.g., work, school)    - SEVERE - unable to stand, requires support to walk, feels like passing out now.      "I have to hold onto the wall or anything so I can hold onto it going to the bathroom".    5. ONSET:  "When did the dizziness begin?"     A week or 2 ago. 6. AGGRAVATING FACTORS: "Does anything make it worse?" (e.g., standing, change in head position)     Moving around. 7. HEART RATE: "Can you tell me your heart rate?" "How many beats in 15 seconds?"  (Note: not all patients can do this)       Not asked 8. CAUSE: "What do you think  is causing the dizziness?"     (On 12/27/2018 she was noted to have a LBBB on EKG when she was c/o being dizzy and hypokalemia when sent to the ED). "I don't know" 9. RECURRENT SYMPTOM: "Have you had dizziness before?" If so, ask: "When was the last time?" "What happened that time?"     Yes   (see chart) 10. OTHER SYMPTOMS: "Do you have any other symptoms?" (e.g., fever, chest pain, vomiting, diarrhea, bleeding)       "I'm so tired".    11. PREGNANCY: "Is there any chance you are pregnant?" "When was your last menstrual period?"       Not asked due to age  Protocols used: DIZZINESS [facility]

## 2019-01-11 ENCOUNTER — Telehealth: Payer: Self-pay | Admitting: Medical

## 2019-01-11 DIAGNOSIS — Z0289 Encounter for other administrative examinations: Secondary | ICD-10-CM

## 2019-01-11 NOTE — Telephone Encounter (Signed)
Pt family advised pt should be seen ED initially but they declined wanting to come see me. I was not sure if seeing me correct decision based on symptoms and comorbidites. I offered appointment this morning since that was only available in office slots today. Family states can only bring he in tomorrow. I advised staff to notify family if dizziness worrse/severe or other symptoms go to ED. I got message dizziness is mild. She is scheduled to see me tomorrow at 8:20.

## 2019-01-12 ENCOUNTER — Ambulatory Visit: Payer: PPO | Admitting: Medical

## 2019-01-12 ENCOUNTER — Other Ambulatory Visit: Payer: Self-pay | Admitting: Medical

## 2019-01-13 ENCOUNTER — Ambulatory Visit (HOSPITAL_BASED_OUTPATIENT_CLINIC_OR_DEPARTMENT_OTHER)
Admission: RE | Admit: 2019-01-13 | Discharge: 2019-01-13 | Disposition: A | Discharge status: Home / Self Care | Payer: PPO | Source: Ambulatory Visit | Attending: Medical | Admitting: Medical

## 2019-01-13 ENCOUNTER — Ambulatory Visit (INDEPENDENT_AMBULATORY_CARE_PROVIDER_SITE_OTHER): Payer: PPO | Admitting: Medical

## 2019-01-13 ENCOUNTER — Encounter: Payer: Self-pay | Admitting: Medical

## 2019-01-13 ENCOUNTER — Other Ambulatory Visit: Payer: Self-pay

## 2019-01-13 ENCOUNTER — Telehealth: Payer: Self-pay | Admitting: Medical

## 2019-01-13 VITALS — BP 146/75 | HR 61 | Temp 98.1°F | Resp 16 | Ht 63.0 in | Wt 141.0 lb

## 2019-01-13 DIAGNOSIS — E876 Hypokalemia: Secondary | ICD-10-CM

## 2019-01-13 DIAGNOSIS — M25552 Pain in left hip: Secondary | ICD-10-CM

## 2019-01-13 DIAGNOSIS — M25511 Pain in right shoulder: Secondary | ICD-10-CM

## 2019-01-13 DIAGNOSIS — R82998 Other abnormal findings in urine: Secondary | ICD-10-CM

## 2019-01-13 DIAGNOSIS — M25562 Pain in left knee: Secondary | ICD-10-CM | POA: Diagnosis not present

## 2019-01-13 DIAGNOSIS — E118 Type 2 diabetes mellitus with unspecified complications: Secondary | ICD-10-CM

## 2019-01-13 MED ORDER — MELOXICAM 7.5 MG PO TABS: 7.5000 mg | ORAL_TABLET | Freq: Every day | ORAL | 0 refills | Status: DC

## 2019-01-13 NOTE — Progress Notes (Signed)
Subjective:    Patient ID: Meredith Phillips, female    DOB: [birth year], 73 y.o.   MRN: [id number]  HPI  Pt in with some hip pain on both sides and some left knee pain. Pt states recently knee pain came on about 2 days ago. Knee hurts more past 2 days. Prior to that rt hip was hurting worse.  Rt  hip xray 2017. IMPRESSION: Negative.   Left knee 03-2018 IMPRESSION: Moderate femorotibial joint space narrowing with chondrocalcinosis and degenerative spurring, slightly progressed along the medial femorotibial compartment since prior.  I don't see left hip xray on epic review.  Pt ran out of meloxicam per epic review.    Pt state recent dizziness 2 weeks ago or more has improved a lot over past 2 weeks. Much less than before per pt. She had low k in ED 2 weeks ago. She also felt fatigued. She has hx of dementia. I ED they gave potassium in the ED. No imaging of head done. Pt states feel more energy now and eating bananas.   Pt had trace infection fighting cells on her ua in ED. No culture was done.  Pt had good kidney function in ED on lab done.    Review of Systems  Constitutional: Negative for chills, diaphoresis, fatigue and fever.       Energy much improved since ED evaluation.  HENT: Negative for congestion, postnasal drip, sinus pressure and sinus pain.   Respiratory: Negative for cough, chest tightness, shortness of breath and wheezing.   Cardiovascular: Negative for chest pain and palpitations.  Gastrointestinal: Negative for abdominal pain.  Genitourinary: Negative for dysuria, flank pain and frequency.  Musculoskeletal: Negative for back pain.       See hpi  Skin: Negative for rash.  Neurological: Positive for dizziness. Negative for speech difficulty, weakness, numbness and headaches.       Much better than before. On currently on exam. Comes and goes per pt. Tells me had for years.  Hematological: Negative for adenopathy. Does not bruise/bleed easily.   Psychiatric/Behavioral: Negative for behavioral problems, dysphoric mood and hallucinations. The patient is not nervous/anxious.    Past Medical History:  Diagnosis Date  . Arthritis   . Benign essential HTN 02/18/2015  . Cellulitis 02/18/2015  . Chicken pox   . Chronic fatigue   . Dementia ([place])   . Depression    since last 04/2018 daughter passed.    . Diabetes mellitus without complication ([place])   . Diabetic peripheral neuropathy ([place])   . GERD (gastroesophageal reflux disease)   . Hypercholesteremia   . Hypertension   . Liver disease   . Polyp of colon      Social History   Socioeconomic History  . Marital status: Married    Spouse name: Not on file  . Number of children: 5  . Years of education: 26  . Highest education level: Not on file  Occupational History  . Occupation: Retired  Scientific laboratory technician  . Financial resource strain: Not on file  . Food insecurity    Worry: Not on file    Inability: Not on file  . Transportation needs    Medical: Not on file    Non-medical: Not on file  Tobacco Use  . Smoking status: Never Smoker  . Smokeless tobacco: Never Used  Substance and Sexual Activity  . Alcohol use: No  . Drug use: No  . Sexual activity: Not on file  Lifestyle  . Physical activity  Days per week: Not on file    Minutes per session: Not on file  . Stress: Not on file  Relationships  . Social Herbalist on phone: Not on file    Gets together: Not on file    Attends religious service: Not on file    Active member of club or organization: Not on file    Attends meetings of clubs or organizations: Not on file    Relationship status: Not on file  . Intimate partner violence    Fear of current or ex partner: Not on file    Emotionally abused: Not on file    Physically abused: Not on file    Forced sexual activity: Not on file  Other Topics Concern  . Not on file  Social History Narrative   Fun: paint, word search   Feels safe where she is  living and no concern for abuse   Denies religious beliefs effecting healthcare.     Past Surgical History:  Procedure Laterality Date  . APPENDECTOMY    . HERNIA REPAIR    . KNEE SURGERY    . ROTATOR CUFF REPAIR    . TONSILLECTOMY      Family History  Problem Relation Age of Onset  . Healthy Mother   . Healthy Father   . Healthy Paternal Grandmother   . Healthy Paternal Grandfather     Allergies  Allergen Reactions  . Codeine Other (See Comments)    Woke up the next morning and did not know where she was  . Other     Doesn't like to take pain medication due to hallucinations.  . Shellfish Allergy     Current Outpatient Medications on File Prior to Visit  Medication Sig Dispense Refill  . acetaZOLAMIDE (DIAMOX) 250 MG tablet TAKE 1 TABLET BY MOUTH TWICE A DAY 30 tablet 1  . amLODipine (NORVASC) 10 MG tablet Take 1 tablet (10 mg total) by mouth daily. 90 tablet 1  . aspirin EC 81 MG tablet Take 81 mg by mouth daily.    [name] atorvastatin (LIPITOR) 20 MG tablet Take 1 tablet (20 mg total) by mouth daily. 90 tablet 3  . Blood Glucose Monitoring Suppl (ONE TOUCH ULTRA 2) w/Device KIT Use to check blood sugars twice a day Dx E11.9 1 each 0  . Cholecalciferol (VITAMIN D-3) 1000 UNITS CAPS Take 1,000 Units by mouth.    . cloNIDine (CATAPRES) 0.1 MG tablet Take 1 tablet (0.1 mg total) by mouth 2 (two) times daily. 60 tablet 5  . cyanocobalamin 1000 MCG tablet Take 1,000 mcg by mouth daily.    [name] esomeprazole (NEXIUM) 40 MG capsule Take 1 capsule (40 mg total) by mouth daily at 12 noon. 90 capsule 1  . famotidine (PEPCID) 20 MG tablet TAKE 1 TABLET BY MOUTH TWICE A DAY 30 tablet 0  . gabapentin (NEURONTIN) 300 MG capsule Take 1 capsule (300 mg total) by mouth 3 (three) times daily. TAKE 1 CAPSULE BY MOUTH THREE TIMES A DAY 270 capsule 0  . glucose blood (ONE TOUCH ULTRA TEST) test strip USE TO CHECK BLOOD SUGARS TWICE A DAY DX E11.9 100 each 1  . Lancets (ONETOUCH ULTRASOFT) lancets USE  TO HELP CHECK BLOOD SUGARS TWICE A DAY E11.9 100 each 1  . meclizine (ANTIVERT) 25 MG tablet TAKE 1/2 TO 1 TABLET BY MOUTH TWICE A DAY AS NEEDED FOR DIZZINESS 30 tablet 0  . metoprolol succinate (TOPROL-XL) 25 MG 24 hr tablet  TAKE 1 TABLET BY MOUTH EVERY DAY 30 tablet 0  . Multiple Vitamin (MULTIVITAMIN) tablet Take 1 tablet by mouth daily.    . sitaGLIPtin (JANUVIA) 100 MG tablet Take 1 tablet (100 mg total) by mouth daily. 90 tablet 1  . terconazole (TERAZOL 3) 0.8 % vaginal cream Place 1 applicator vaginally at bedtime. 20 g 0   No current facility-administered medications on file prior to visit.     BP (!) 146/75   Pulse 61   Temp 98.1 F (36.7 C) (Oral)   Resp 16   Ht 5' 3" (1.6 m)   Wt 141 lb (64 kg)   SpO2 100%   BMI 24.98 kg/m       Objective:   Physical Exam   General Mental Status- Alert. General Appearance- Not in acute distress.   Skin General: Color- Normal Color. Moisture- Normal Moisture.  Neck Carotid Arteries- Normal color. Moisture- Normal Moisture. No carotid bruits. No JVD.  Chest and Lung Exam Auscultation: Breath Sounds:-Normal.  Cardiovascular Auscultation:Rythm- Regular. Murmurs & Other Heart Sounds:Auscultation of the heart reveals- No Murmurs.  Abdomen Inspection:-Inspeection Normal. Palpation/Percussion:Note:No mass. Palpation and Percussion of the abdomen reveal- Non Tender, Non Distended + BS, no rebound or guarding.    Neurologic Cranial Nerve exam:- CN III-XII intact(No nystagmus), symmetric smile. Strength:- 5/5 equal and symmetric strength both upper and lower extremities.  Left knee- moderate pain on rom. No obvious  Crepitus. No swelling. No warmth. Both hips- good range of motion. Mild tenderness to palpation.    Assessment & Plan:  For your recent left knee pain, left hip pain and chronic right hip pain, I am going to go ahead and refer you to sports medicine.  The left knee x-ray last year showed some moderate significant  findings.  Due to your age prefer that you see sports medicine rather than orthopedist.  It appears that you have been out of your meloxicam some going to go ahead and refill that today.  Xray of left hip today.  Your dizziness has improved a lot since emergency department visit.  You have good motor function today.  I do not think any imaging studies are needed presently.  If dizziness worsens again or new signs/symptoms that please let us know.  For history of low potassium 2 weeks ago, will repeat metabolic panel today.  For history of diabetes will also get repeat A1c.  You had some fatigue 2 weeks ago and that is improving.  But your urine did look possibly infected with trace leukocytes.  No culture was done in the emergency department so we will get urine culture today.  Follow-up in 2 weeks or as needed.  40 minutes spent with pt today. 50% of time spent counseling on dx, tx plan going forward. Answered pt questions.

## 2019-01-13 NOTE — Patient Instructions (Addendum)
For your recent left knee pain, left hip pain and chronic right hip pain, I am going to go ahead and refer you to sports medicine.  The left knee x-ray last year showed some moderate significant findings.  Due to your age prefer that you see sports medicine rather than orthopedist.  It appears that you have been out of your meloxicam some going to go ahead and refill that today.  Xray of left hip today.  Your dizziness has improved a lot since emergency department visit.  You have good motor function today.  I do not think any imaging studies are needed presently.  If dizziness worsens again or new signs/symptoms that please let us know.  For history of low potassium 2 weeks ago, will repeat metabolic panel today.  For history of diabetes will also get repeat A1c.  You had some fatigue 2 weeks ago and that is improving.  But your urine did look possibly infected with trace leukocytes.  No culture was done in the emergency department so we will get urine culture today.  Follow-up in 2 weeks or as needed.

## 2019-01-13 NOTE — Telephone Encounter (Signed)
Called to schedule 2 wk f/u

## 2019-01-14 ENCOUNTER — Other Ambulatory Visit: Payer: Self-pay | Admitting: Medical

## 2019-01-18 ENCOUNTER — Other Ambulatory Visit (INDEPENDENT_AMBULATORY_CARE_PROVIDER_SITE_OTHER): Payer: PPO

## 2019-01-18 ENCOUNTER — Other Ambulatory Visit: Payer: Self-pay

## 2019-01-18 ENCOUNTER — Telehealth: Payer: Self-pay | Admitting: Medical

## 2019-01-18 ENCOUNTER — Ambulatory Visit: Payer: Self-pay

## 2019-01-18 ENCOUNTER — Encounter: Payer: Self-pay | Admitting: Family Medicine

## 2019-01-18 ENCOUNTER — Ambulatory Visit: Payer: PPO | Admitting: Family Medicine

## 2019-01-18 VITALS — BP 130/84 | HR 62 | Ht 63.0 in | Wt 141.0 lb

## 2019-01-18 DIAGNOSIS — I10 Essential (primary) hypertension: Secondary | ICD-10-CM | POA: Diagnosis not present

## 2019-01-18 DIAGNOSIS — M25552 Pain in left hip: Secondary | ICD-10-CM

## 2019-01-18 DIAGNOSIS — R5383 Other fatigue: Secondary | ICD-10-CM

## 2019-01-18 DIAGNOSIS — D649 Anemia, unspecified: Secondary | ICD-10-CM

## 2019-01-18 LAB — COMPREHENSIVE METABOLIC PANEL
ALT: 6 U/L (ref 0–35)
AST: 11 U/L (ref 0–37)
Albumin: 4.2 g/dL (ref 3.5–5.2)
Alkaline Phosphatase: 57 U/L (ref 39–117)
BUN: 9 mg/dL (ref 6–23)
CO2: 23 mEq/L (ref 19–32)
Calcium: 10 mg/dL (ref 8.4–10.5)
Chloride: 109 mEq/L (ref 96–112)
Creatinine, Ser: 0.93 mg/dL (ref 0.40–1.20)
GFR: 71.01 mL/min (ref >60.00)
Glucose, Bld: 120 mg/dL — ABNORMAL HIGH (ref 70–99)
Potassium: 3.4 mEq/L — ABNORMAL LOW (ref 3.5–5.1)
Sodium: 140 mEq/L (ref 135–145)
Total Bilirubin: 0.6 mg/dL (ref 0.2–1.2)
Total Protein: 7.1 g/dL (ref 6.0–8.3)

## 2019-01-18 LAB — CBC WITH DIFFERENTIAL/PLATELET
Basophils Absolute: 0 10*3/uL (ref 0.0–0.1)
Basophils Relative: 0.7 % (ref 0.0–3.0)
Eosinophils Absolute: 0.5 10*3/uL (ref 0.0–0.7)
Eosinophils Relative: 9.6 % — ABNORMAL HIGH (ref 0.0–5.0)
HCT: 35.9 % — ABNORMAL LOW (ref 36.0–46.0)
Hemoglobin: 11.6 g/dL — ABNORMAL LOW (ref 12.0–15.0)
Lymphocytes Relative: 39.1 % (ref 12.0–46.0)
Lymphs Abs: 2 10*3/uL (ref 0.7–4.0)
MCHC: 32.4 g/dL (ref 30.0–36.0)
MCV: 88.6 fl (ref 78.0–100.0)
Monocytes Absolute: 0.4 10*3/uL (ref 0.1–1.0)
Monocytes Relative: 8.5 % (ref 3.0–12.0)
Neutro Abs: 2.2 10*3/uL (ref 1.4–7.7)
Neutrophils Relative %: 42.1 % — ABNORMAL LOW (ref 43.0–77.0)
Platelets: 291 10*3/uL (ref 150.0–400.0)
RBC: 4.05 Mil/uL (ref 3.87–5.11)
RDW: 14.2 % (ref 11.5–15.5)
WBC: 5.2 10*3/uL (ref 4.0–10.5)

## 2019-01-18 MED ORDER — METHYLPREDNISOLONE ACETATE 40 MG/ML IJ SUSP
40.0000 mg | Freq: Once | INTRAMUSCULAR | Status: AC
  Administered 2019-01-18: 40 mg via INTRA_ARTICULAR

## 2019-01-18 NOTE — Patient Instructions (Signed)
Nice to meet you Please try the exercises  Please try ice on the area  Please try the rub on medicine for the knees and other locations with pain   Please send me a message in MyChart with any questions or updates.  Please see me back in 4 weeks.   --Dr. Raeford Razor

## 2019-01-18 NOTE — Telephone Encounter (Signed)
Future cbc and iron study placed. Will put future ifob order in for me to sign.

## 2019-01-18 NOTE — Progress Notes (Signed)
Meredith Phillips IZTIWP - 73 y.o. female MRN [id number]  Date of birth: 02-Jul-1945  SUBJECTIVE:  Including CC & ROS.  Chief Complaint  Patient presents with  . Hip Pain    left hip x 1-2 weeks    Meredith Phillips is a 73 y.o. female that is presenting with acute on chronic left lateral hip pain.  The pain is been ongoing for a couple of weeks.  She denies any inciting event.  The pain is becoming more constant.  It is localized to the greater trochanter.  No improvement with conservative measures to date.  Pain is worse with certain movements.  No radicular symptom.  Pain is sharp and throbbing.  Independent review of the right hip x-ray from 7/30 shows mild degenerative changes of the hips with left worse than right.   Independent review of the left knee x-ray from 04/02/2018 shows mild to moderate degenerative changes in the knee.   Review of Systems  Constitutional: Negative for fever.  HENT: Negative for congestion.   Respiratory: Negative for cough.   Cardiovascular: Negative for chest pain.  Gastrointestinal: Negative for abdominal pain.  Musculoskeletal: Positive for arthralgias and gait problem.  Skin: Negative for color change.  Neurological: Negative for syncope.  Hematological: Negative for adenopathy.    HISTORY: Past Medical, Surgical, Social, and Family History Reviewed & Updated per EMR.   Pertinent Historical Findings include:  Past Medical History:  Diagnosis Date  . Arthritis   . Benign essential HTN 02/18/2015  . Cellulitis 02/18/2015  . Chicken pox   . Chronic fatigue   . Dementia ([place])   . Depression    since last 04/2018 daughter passed.    . Diabetes mellitus without complication ([place])   . Diabetic peripheral neuropathy ([place])   . GERD (gastroesophageal reflux disease)   . Hypercholesteremia   . Hypertension   . Liver disease   . Polyp of colon     Past Surgical History:  Procedure Laterality Date  . APPENDECTOMY    . HERNIA REPAIR    . KNEE SURGERY    .  ROTATOR CUFF REPAIR    . TONSILLECTOMY      Allergies  Allergen Reactions  . Codeine Other (See Comments)    Woke up the next morning and did not know where she was  . Other     Doesn't like to take pain medication due to hallucinations.  . Shellfish Allergy     Family History  Problem Relation Age of Onset  . Healthy Mother   . Healthy Father   . Healthy Paternal Grandmother   . Healthy Paternal Grandfather      Social History   Socioeconomic History  . Marital status: Married    Spouse name: Not on file  . Number of children: 5  . Years of education: 95  . Highest education level: Not on file  Occupational History  . Occupation: Retired  Scientific laboratory technician  . Financial resource strain: Not on file  . Food insecurity    Worry: Not on file    Inability: Not on file  . Transportation needs    Medical: Not on file    Non-medical: Not on file  Tobacco Use  . Smoking status: Never Smoker  . Smokeless tobacco: Never Used  Substance and Sexual Activity  . Alcohol use: No  . Drug use: No  . Sexual activity: Not on file  Lifestyle  . Physical activity    Days per week: Not  on file    Minutes per session: Not on file  . Stress: Not on file  Relationships  . Social Musician on phone: Not on file    Gets together: Not on file    Attends religious service: Not on file    Active member of club or organization: Not on file    Attends meetings of clubs or organizations: Not on file    Relationship status: Not on file  . Intimate partner violence    Fear of current or ex partner: Not on file    Emotionally abused: Not on file    Physically abused: Not on file    Forced sexual activity: Not on file  Other Topics Concern  . Not on file  Social History Narrative   Fun: paint, word search   Feels safe where she is living and no concern for abuse   Denies religious beliefs effecting healthcare.      PHYSICAL EXAM:  VS: BP 130/84   Pulse 62   Ht 5\' 3"  (1.6  m)   Wt 141 lb (64 kg)   BMI 24.98 kg/m  Physical Exam Gen: NAD, alert, cooperative with exam, well-appearing ENT: normal lips, normal nasal mucosa,  Eye: normal EOM, normal conjunctiva and lids CV:  no edema, +2 pedal pulses   Resp: no accessory muscle use, non-labored,  Skin: no rashes, no areas of induration  Neuro: normal tone, normal sensation to touch Psych:  normal insight, alert and oriented MSK:  Left hip: Tenderness to palpation of the greater trochanter. Normal internal and external rotation of the hip. Normal strength resistance with hip flexion, knee flexion extension. Normal gait. Negative straight leg raise. Neurovascular intact   Aspiration/Injection Procedure Note Kathyjo Briere Arcelia Jew 1946-03-10  Procedure: Injection Indications: Left hip pain  Procedure Details Consent: Risks of procedure as well as the alternatives and risks of each were explained to the (patient/caregiver).  Consent for procedure obtained. Time Out: Verified patient identification, verified procedure, site/side was marked, verified correct patient position, special equipment/implants available, medications/allergies/relevent history reviewed, required imaging and test results available.  Performed.  The area was cleaned with iodine and alcohol swabs.    The left greater trochanteric bursa was injected using 1 cc's of 40 mg Depo-Medrol and 4 cc's of 0.25% bupivacaine with a 22 3 1/2" needle.  Ultrasound was used. Images were obtained in short views showing the injection.     A sterile dressing was applied.  Patient did tolerate procedure well.       ASSESSMENT & PLAN:   Greater trochanteric pain syndrome of left lower extremity Acute pain likely related to the greater trochanter and weakness with hip abduction.  Less likely to involve the joint or be related to radicular symptoms. -Greater troch injection. -Counseled on home exercise program supportive care. -Provided samples of  Pennsaid. -If no improvement can consider physical therapy

## 2019-01-18 NOTE — Assessment & Plan Note (Signed)
Acute pain likely related to the greater trochanter and weakness with hip abduction.  Less likely to involve the joint or be related to radicular symptoms. -Greater troch injection. -Counseled on home exercise program supportive care. -Provided samples of Pennsaid. -If no improvement can consider physical therapy

## 2019-01-18 NOTE — Progress Notes (Signed)
Medication Samples have been provided to the patient.  Drug name: Pennsaid      Strength: 2%        Qty: 2 Boxes  LOT: [id number]  Exp.Date: 07/2019  Dosing instructions: Use a peasize amount and rub gently  The patient has been instructed regarding the correct time, dose, and frequency of taking this medication, including desired effects and most common side effects.   Sherrie George, [place] 10:24 AM 01/18/2019

## 2019-01-19 ENCOUNTER — Other Ambulatory Visit: Payer: Self-pay | Admitting: Medical

## 2019-01-19 DIAGNOSIS — M25511 Pain in right shoulder: Secondary | ICD-10-CM

## 2019-01-19 NOTE — Addendum Note (Signed)
Addended by: Hinton Dyer on: 01/19/2019 08:16 AM   Modules accepted: Orders

## 2019-01-19 NOTE — Telephone Encounter (Signed)
Order placed

## 2019-01-26 ENCOUNTER — Other Ambulatory Visit: Payer: Self-pay | Admitting: Medical

## 2019-02-02 ENCOUNTER — Other Ambulatory Visit: Payer: PPO

## 2019-02-08 DIAGNOSIS — H34831 Tributary (branch) retinal vein occlusion, right eye, with macular edema: Secondary | ICD-10-CM | POA: Diagnosis not present

## 2019-02-13 ENCOUNTER — Other Ambulatory Visit: Payer: Self-pay | Admitting: Medical

## 2019-02-15 ENCOUNTER — Other Ambulatory Visit: Payer: Self-pay

## 2019-02-15 ENCOUNTER — Encounter: Payer: Self-pay | Admitting: Family Medicine

## 2019-02-15 ENCOUNTER — Ambulatory Visit (INDEPENDENT_AMBULATORY_CARE_PROVIDER_SITE_OTHER): Payer: PPO | Admitting: Family Medicine

## 2019-02-15 VITALS — BP 120/78 | Ht 63.0 in | Wt 141.0 lb

## 2019-02-15 DIAGNOSIS — M25552 Pain in left hip: Secondary | ICD-10-CM

## 2019-02-15 NOTE — Progress Notes (Signed)
Meredith Phillips JJOACZ - 73 y.o. female MRN [id number]  Date of birth: [birth year]  SUBJECTIVE:  Including CC & ROS.  Chief Complaint  Patient presents with  . Follow-up    follow up for left hip / knee    Meredith Phillips is a 72 y.o. female that is following up for left hip pain.  She was provided an injection on the previous visit.  She reports improvement of her pain.  She does still have some pain occurring over the lateral aspect of the hip.  She is still walking on a regular basis.  Pain does not seem to radicular.  No numbness or tingling.  Does seem to be worse with walking   Review of Systems  Constitutional: Negative for fever.  HENT: Negative for congestion.   Respiratory: Negative for cough.   Cardiovascular: Negative for chest pain.  Gastrointestinal: Negative for abdominal pain.  Musculoskeletal: Positive for arthralgias.  Skin: Negative for color change.  Neurological: Negative for weakness.  Hematological: Negative for adenopathy.    HISTORY: Past Medical, Surgical, Social, and Family History Reviewed & Updated per EMR.   Pertinent Historical Findings include:  Past Medical History:  Diagnosis Date  . Arthritis   . Benign essential HTN 02/18/2015  . Cellulitis 02/18/2015  . Chicken pox   . Chronic fatigue   . Dementia ([facility])   . Depression    since last 04/2018 daughter passed.    . Diabetes mellitus without complication ([facility])   . Diabetic peripheral neuropathy ([facility])   . GERD (gastroesophageal reflux disease)   . Hypercholesteremia   . Hypertension   . Liver disease   . Polyp of colon     Past Surgical History:  Procedure Laterality Date  . APPENDECTOMY    . HERNIA REPAIR    . KNEE SURGERY    . ROTATOR CUFF REPAIR    . TONSILLECTOMY      Allergies  Allergen Reactions  . Codeine Other (See Comments)    Woke up the next morning and did not know where she was  . Other     Doesn't like to take pain medication due to hallucinations.  . Shellfish Allergy     Family History  Problem Relation Age of Onset  . Healthy Mother   . Healthy Father   . Healthy Paternal Grandmother   . Healthy Paternal Grandfather      Social History   Socioeconomic History  . Marital status: Married    Spouse name: Not on file  . Number of children: 5  . Years of education: 27  . Highest education level: Not on file  Occupational History  . Occupation: Retired  Engineer, production  . Financial resource strain: Not on file  . Food insecurity    Worry: Not on file    Inability: Not on file  . Transportation needs    Medical: Not on file    Non-medical: Not on file  Tobacco Use  . Smoking status: Never Smoker  . Smokeless tobacco: Never Used  Substance and Sexual Activity  . Alcohol use: No  . Drug use: No  . Sexual activity: Not on file  Lifestyle  . Physical activity    Days per week: Not on file    Minutes per session: Not on file  . Stress: Not on file  Relationships  . Social Musician on phone: Not on file    Gets together: Not on file    Attends  religious service: Not on file    Active member of club or organization: Not on file    Attends meetings of clubs or organizations: Not on file    Relationship status: Not on file  . Intimate partner violence    Fear of current or ex partner: Not on file    Emotionally abused: Not on file    Physically abused: Not on file    Forced sexual activity: Not on file  Other Topics Concern  . Not on file  Social History Narrative   Fun: paint, word search   Feels safe where she is living and no concern for abuse   Denies religious beliefs effecting healthcare.      PHYSICAL EXAM:  VS: BP 120/78   Ht 5\' 3"  (1.6 m)   Wt 141 lb (64 kg)   BMI 24.98 kg/m  Physical Exam Gen: NAD, alert, cooperative with exam, well-appearing ENT: normal lips, normal nasal mucosa,  Eye: normal EOM, normal conjunctiva and lids CV:  no edema, +2 pedal pulses   Resp: no accessory muscle use, non-labored,   Skin: no rashes, no areas of induration  Neuro: normal tone, normal sensation to touch Psych:  normal insight, alert and oriented MSK:  Left hip: Some tenderness to palpation of the greater trochanter. Normal internal and external rotation. Normal strength resistance with hip flexion. Weakness with hip abduction. Neurovascular intact     ASSESSMENT & PLAN:   Greater trochanteric pain syndrome of left lower extremity Has had some improvement with the injection.  Still having some pain upon palpation. -Referral to physical therapy. -Counseled on home exercise therapy and supportive care.

## 2019-02-15 NOTE — Patient Instructions (Signed)
Good to see you Please try ice on the area  Please try the exercises  You will get a call to schedule physical therapy Please send me a message in [place] with any questions or updates.  Please see me back in 6 weeks.   --Dr. Raeford Razor

## 2019-02-15 NOTE — Assessment & Plan Note (Signed)
Has had some improvement with the injection.  Still having some pain upon palpation. -Referral to physical therapy. -Counseled on home exercise therapy and supportive care.

## 2019-02-22 ENCOUNTER — Other Ambulatory Visit: Payer: Self-pay | Admitting: Medical

## 2019-02-24 ENCOUNTER — Ambulatory Visit: Payer: PPO | Attending: Family Medicine | Admitting: Physical Therapy

## 2019-02-24 DIAGNOSIS — R262 Difficulty in walking, not elsewhere classified: Secondary | ICD-10-CM | POA: Insufficient documentation

## 2019-02-24 DIAGNOSIS — M6281 Muscle weakness (generalized): Secondary | ICD-10-CM | POA: Insufficient documentation

## 2019-02-24 DIAGNOSIS — M25652 Stiffness of left hip, not elsewhere classified: Secondary | ICD-10-CM | POA: Insufficient documentation

## 2019-02-24 DIAGNOSIS — M25552 Pain in left hip: Secondary | ICD-10-CM | POA: Insufficient documentation

## 2019-03-03 ENCOUNTER — Ambulatory Visit: Payer: PPO | Admitting: Physical Therapy

## 2019-03-03 ENCOUNTER — Other Ambulatory Visit: Payer: Self-pay

## 2019-03-03 ENCOUNTER — Encounter: Payer: Self-pay | Admitting: Physical Therapy

## 2019-03-03 DIAGNOSIS — M25552 Pain in left hip: Secondary | ICD-10-CM

## 2019-03-03 DIAGNOSIS — M6281 Muscle weakness (generalized): Secondary | ICD-10-CM | POA: Diagnosis not present

## 2019-03-03 DIAGNOSIS — M25652 Stiffness of left hip, not elsewhere classified: Secondary | ICD-10-CM | POA: Diagnosis not present

## 2019-03-03 DIAGNOSIS — R262 Difficulty in walking, not elsewhere classified: Secondary | ICD-10-CM

## 2019-03-03 NOTE — Therapy (Signed)
[facility] Outpatient Rehabilitation [facility] [address]  [address] [place], [place], [address] Phone: [phone]   Fax:  [phone]  Physical Therapy Evaluation  Patient Details  Name: Meredith Phillips MRN: [id number] Date of Birth: 09-14-1945 Referring Provider (PT): Clare Gandy, MD   Encounter Date: 03/03/2019  PT End of Session - 03/03/19 1218    Visit Number  1    Number of Visits  13    Date for PT Re-Evaluation  04/14/19    Authorization Type  [employer]    PT Start Time  0930    PT Stop Time  1014    PT Time Calculation (min)  44 min    Activity Tolerance  Patient tolerated treatment well;Patient limited by pain    Behavior During Therapy  [facility] for tasks assessed/performed       Past Medical History:  Diagnosis Date  . Arthritis   . Benign essential HTN 02/18/2015  . Cellulitis 02/18/2015  . Chicken pox   . Chronic fatigue   . Dementia (HCC)   . Depression    since last 04/2018 daughter passed.    . Diabetes mellitus without complication (HCC)   . Diabetic peripheral neuropathy (HCC)   . GERD (gastroesophageal reflux disease)   . Hypercholesteremia   . Hypertension   . Liver disease   . Polyp of colon     Past Surgical History:  Procedure Laterality Date  . APPENDECTOMY    . HERNIA REPAIR    . KNEE SURGERY    . ROTATOR CUFF REPAIR    . TONSILLECTOMY      There were no vitals filed for this visit.   Subjective Assessment - 03/03/19 0932    Subjective  Patient reports L hip pain for several months, but with recent worsening for the past 1-2 months. Has tried to hold off on going to therapy until she couldn't tolerate it any longer. Pain started in L anterolateral hip, then radiated to lateral hip and posterior buttock. Does report 1 episode of shooting pain down L anterior leg. Denies N/T. Worse with prolonged sitting, catching with STS transitions, laying on L side, prolonged walking. On days when she is hurting really bad she  uses a cane, also when going up/down stairs. No AD at PLOF. 1 month ago her L knee gave out on her causing her to have to catch herself with her hands.    Pertinent History  Liver disease, HTN, HLD, GERD, DM, peripheral neuropathy, depression, dementia, chronic fatigue, RTC repair, knee surgery    Limitations  Sitting;Lifting;Standing;Walking;House hold activities    How long can you sit comfortably?  1 hour    How long can you stand comfortably?  30 min    How long can you walk comfortably?  unsure    Diagnostic tests  01/13/19 L hip xray: No evidence for acute abnormality.    Patient Stated Goals  "i wish my legs were stronger"    Currently in Pain?  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  1    Has the patient had a decrease in activity level because of a fear of falling?   No    Is the patient reluctant to leave their home because of a fear of falling?   No      Home Public house managernvironment   Living Environment  Private residence    Living Arrangements  Spouse/significant other   grandson and husband   Available Help at Discharge  Family    Type of Home  House    Home Access  Level entry    Home Layout  One level    Home Equipment  [place] - single point      Prior Function   Level of Independence  Independent    Vocation  Retired    Leisure  walking      Cognition   Overall Cognitive Status  Within Functional Limits for tasks assessed      Sensation   Light Touch  Appears Intact      Coordination   Gross  Motor Movements are Fluid and Coordinated  Yes      Posture/Postural Control   Posture/Postural Control  Postural limitations    Postural Limitations  Rounded Shoulders;Forward head;Increased thoracic kyphosis;Posterior pelvic tilt      ROM / Strength   AROM / PROM / Strength  AROM;Strength;PROM      AROM   AROM Assessment Site  Hip    Right/Left Hip  Left;Right    Right Hip Flexion  120    Right Hip External Rotation   23   sitting   Right Hip Internal Rotation   35   sitting   Right Hip ABduction  13    Left Hip Flexion  107   pain   Left Hip External Rotation   17   sitting   Left Hip Internal Rotation   36   sitting   Left Hip ABduction  29      PROM   Overall PROM Comments  L hip PROM WFL and nonpainful      Strength   Strength Assessment Site  Hip;Knee;Ankle    Right/Left Hip  Right;Left    Right Hip Flexion  4/5    Right Hip ABduction  4+/5    Right Hip ADduction  3+/5    Left Hip Flexion  4-/5   pain   Left Hip ABduction  3+/5   pain   Left Hip ADduction  3+/5    Right/Left Knee  Right;Left    Right Knee Flexion  4/5    Right Knee Extension  4+/5    Left Knee Flexion  4/5    Left Knee Extension  4/5    Right/Left Ankle  Right;Left    Right Ankle Dorsiflexion  4/5    Right Ankle Plantar Flexion  4/5    Left Ankle Dorsiflexion  4/5    Left Ankle Plantar Flexion  4/5      Flexibility   Soft Tissue Assessment /Muscle Length  yes    Hamstrings  mild tightness L LE    ITB  severely tight L LE    Piriformis  mildly tight on L LE  Palpation   Palpation comment  TTP over L ITB insertion, greater trochanter, proximal glutes, mildly over piriformis      Ambulation/Gait   Assistive device  None    Gait Pattern  Step-to pattern;Step-through pattern;Decreased step length - left;Decreased stance time - right;Right foot flat;Left foot flat;Poor foot clearance - left;Poor foot clearance - right    Ambulation Surface  Level;Indoor    Gait velocity  decreased                 Objective measurements completed on examination: See above findings.              PT Education - 03/03/19 1218    Education Details  prognosi, POC, HEP    Person(s) Educated  Patient    Methods  Explanation;Demonstration;Tactile cues;Verbal cues;Handout    Comprehension  Verbalized understanding;Returned demonstration       PT Short Term Goals - 03/03/19 1225      PT SHORT TERM GOAL #1   Title  patient to be independent with initial HEP    Time  3    Period  Weeks    Status  New    Target Date  03/24/19        PT Long Term Goals - 03/03/19 1226      PT LONG TERM GOAL #1   Title  patient to be independwnt with advanced HEP    Time  6    Period  Weeks    Status  New    Target Date  04/14/19      PT LONG TERM GOAL #2   Title  Patient to demonstrate L hip AROM symmetrical to opposite LE and nonpainful.    Time  6    Period  Weeks    Status  New    Target Date  04/14/19      PT LONG TERM GOAL #3   Title  Patient to demonstrate B LE strength >/=4+/5.    Period  Weeks    Status  New    Target Date  04/14/19      PT LONG TERM GOAL #4   Title  Patient to report 75% improvement in catching pain with STS transitions.    Time  6    Period  Weeks    Status  New    Target Date  04/14/19      PT LONG TERM GOAL #5   Title  Patient to report 50% improved confidence when walking without AD d/t improvement in pain levels.    Time  6    Period  Weeks    Status  New    Target Date  04/14/19             Plan - 03/03/19 1219    Clinical Impression Statement  Patient is a 73y/o F presenting to OPPT with c/o acute on chronic L hip pain of 1-2 months duration. Pain initially began in L anterolateral hip but has now spread to lateral hip and posterior buttock. Worse with prolonged sitting, catching with STS transitions, laying on L side, prolonged walking. Intermittently has to use a SPC d/t this pain, whereas she walking without AD at  PLOF. Subjective history somewhat unclear as patient with limited attention span today. Presented with limited and painful LE strength testing, decreased L hip ROM, abnormal posture, gait deviations, limited L LE flexibility, and tenderness over lateral and posterior hip musculature. Educated patient on gentle stretching and strengthening HEP- patient  reported understanding. Would benefit from skilled PT services 2x/week for 6 weeks to address aforementioned impairments.    Personal Factors and Comorbidities  Age;Comorbidity 3+;Time since onset of injury/illness/exacerbation;Fitness;Past/Current Experience    Comorbidities  Liver disease, HTN, HLD, GERD, DM, peripheral neuropathy, depression, dementia, chronic fatigue, RTC repair, knee surgery    Examination-Activity Limitations  Sit;Bed Mobility;Sleep;Squat;Stairs;Carry;Stand;Toileting;Dressing;Transfers;Lift;Locomotion Level    Examination-Participation Restrictions  Church;Cleaning;Shop;Community Activity;Driving;Yard Work;Interpersonal Relationship;Laundry;Meal Prep    Stability/Clinical Decision Making  Stable/Uncomplicated    Clinical Decision Making  Moderate    Rehab Potential  Good    PT Frequency  2x / week    PT Duration  6 weeks    PT Treatment/Interventions  ADLs/Self Care Home Management;Cryotherapy;Electrical Stimulation;Iontophoresis 4mg /ml Dexamethasone;Moist Heat;Balance training;Therapeutic exercise;Therapeutic activities;Functional mobility training;Stair training;Gait training;Ultrasound;Neuromuscular re-education;Patient/family education;Manual techniques;Taping;Energy conservation;Dry needling;Passive range of motion    PT Next Visit Plan  reassess HEP; FOTO    Consulted and Agree with Plan of Care  Patient       Patient will benefit from skilled therapeutic intervention in order to improve the following deficits and impairments:  Hypomobility, Decreased activity tolerance, Decreased strength, Pain, Difficulty walking,  Decreased balance, Decreased range of motion, Improper body mechanics, Postural dysfunction, Impaired flexibility  Visit Diagnosis: Pain in left hip  Stiffness of left hip, not elsewhere classified  Muscle weakness (generalized)  Difficulty in walking, not elsewhere classified     Problem List Patient Active Problem List   Diagnosis Date Noted  . Greater trochanteric pain syndrome of left lower extremity 01/18/2019  . Hyperlipidemia 03/26/2018  . Hyponatremia 03/26/2018  . Atypical chest pain 03/25/2018  . Urge incontinence 02/25/2018  . Glucosuria 02/25/2018  . Anxiety and depression 12/16/2017  . Memory loss 11/02/2017  . B12 deficiency 11/02/2017  . Vertigo 10/10/2017  . Trochanteric bursitis of left hip 10/10/2017  . Murmur, cardiac 04/15/2017  . Asymptomatic postmenopausal estrogen deficiency 04/15/2017  . Right-sided low back pain without sciatica 02/10/2017  . Grief at loss of child 07/29/2016  . Allergic rhinitis 02/27/2016  . GERD (gastroesophageal reflux disease) 08/06/2015  . Baker's cyst, ruptured 08/03/2015  . Type 2 diabetes mellitus ([place]) 06/29/2015  . Arthritis of left knee 06/15/2015  . Benign essential HTN 02/18/2015  . Cellulitis 02/18/2015  . Sciatica 01/17/2015  . Routine general medical examination at a health care facility 12/26/2014  . Medicare annual wellness visit, subsequent 12/26/2014  . Generalized osteoarthritis of multiple sites 11/25/2014  . GERD with esophagitis 11/25/2014  . Hip arthrosis 11/25/2014  . Diarrhea in adult patient 11/29/2013    Janene Harvey, PT, DPT 03/03/19 12:29 PM   [facility] [address]  [place], [place], [address] Phone: [phone]   Fax:  [phone]  Name: Meredith Phillips MRN: [id number] Date of Birth: [birth year]

## 2019-03-04 ENCOUNTER — Other Ambulatory Visit: Payer: Self-pay

## 2019-03-04 MED ORDER — METOPROLOL SUCCINATE ER 25 MG PO TB24: 25.0000 mg | ORAL_TABLET | Freq: Every day | ORAL | 5 refills | Status: DC

## 2019-03-08 ENCOUNTER — Ambulatory Visit: Payer: PPO

## 2019-03-08 ENCOUNTER — Other Ambulatory Visit: Payer: Self-pay

## 2019-03-08 DIAGNOSIS — M25652 Stiffness of left hip, not elsewhere classified: Secondary | ICD-10-CM

## 2019-03-08 DIAGNOSIS — M25552 Pain in left hip: Secondary | ICD-10-CM | POA: Diagnosis not present

## 2019-03-08 DIAGNOSIS — R262 Difficulty in walking, not elsewhere classified: Secondary | ICD-10-CM

## 2019-03-08 DIAGNOSIS — H34831 Tributary (branch) retinal vein occlusion, right eye, with macular edema: Secondary | ICD-10-CM | POA: Diagnosis not present

## 2019-03-08 DIAGNOSIS — M6281 Muscle weakness (generalized): Secondary | ICD-10-CM

## 2019-03-08 NOTE — Therapy (Signed)
[facility] Outpatient Rehabilitation [facility] [address]  [address] [place], [place], [address] Phone: [phone]   Fax:  [phone]  Physical Therapy Treatment  Patient Details  Name: Meredith Phillips MRN: [id number] Date of Birth: Jul 22, 1945 Referring Provider (PT): Clare Gandy, MD   Encounter Date: 03/08/2019  PT End of Session - 03/08/19 1113    Visit Number  2    Number of Visits  13    Date for PT Re-Evaluation  04/14/19    Authorization Type  [employer]    PT Start Time  1103    PT Stop Time  1135   Pt. 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 Arthritis   . Benign essential HTN 02/18/2015  . Cellulitis 02/18/2015  . Chicken pox   . Chronic fatigue   . Dementia (HCC)   . Depression    since last 04/2018 daughter passed.    . Diabetes mellitus without complication (HCC)   . Diabetic peripheral neuropathy (HCC)   . GERD (gastroesophageal reflux disease)   . Hypercholesteremia   . Hypertension   . Liver disease   . Polyp of colon     Past Surgical History:  Procedure Laterality Date  . APPENDECTOMY    . HERNIA REPAIR    . KNEE SURGERY    . ROTATOR CUFF REPAIR    . TONSILLECTOMY      There were no vitals filed for this visit.  Subjective Assessment - 03/08/19 1110    Subjective  Pt. reporting L LE pain this morning after first standing from bed.  Pain subsided as she walked this morning    Pertinent History  Liver disease, HTN, HLD, GERD, DM, peripheral neuropathy, depression, dementia, chronic fatigue, RTC repair, knee surgery    Diagnostic tests  01/13/19 L hip xray: No evidence for acute abnormality.    Patient Stated Goals  "i wish my legs were stronger"    Currently in Pain?   No/denies    Pain Score  0-No pain   up to 10/10 when first standing in bed   Pain Location  Hip    Pain Orientation  Left    Pain Descriptors / Indicators  Sharp    Pain Type  Chronic pain    Multiple Pain Sites  No                       [facility] Adult PT Treatment/Exercise - 03/08/19 0001      Knee/Hip Exercises: Stretches   Passive Hamstring Stretch  Left;2 reps;30 seconds    Passive Hamstring Stretch Limitations  strap     ITB Stretch  Left;2 reps;30 seconds    ITB Stretch Limitations  strap    Piriformis Stretch  Left;2 reps;30 seconds    Piriformis Stretch Limitations  KTOS    Other Knee/Hip Stretches  L figure-4 stretch x 30 sec      Knee/Hip Exercises: Aerobic   Nustep  Lvl 3, 7 min (LE only)      Knee/Hip Exercises: Supine   Bridges  2 sets;5 reps;Strengthening               PT  Short Term Goals - 03/08/19 1114      PT SHORT TERM GOAL #1   Title  patient to be independent with initial HEP    Time  3    Period  Weeks    Status  On-going    Target Date  03/24/19        PT Long Term Goals - 03/08/19 1114      PT LONG TERM GOAL #1   Title  patient to be independwnt with advanced HEP    Time  6    Period  Weeks    Status  On-going      PT LONG TERM GOAL #2   Title  Patient to demonstrate L hip AROM symmetrical to opposite LE and nonpainful.    Time  6    Period  Weeks    Status  On-going      PT LONG TERM GOAL #3   Title  Patient to demonstrate B LE strength >/=4+/5.    Period  Weeks    Status  On-going      PT LONG TERM GOAL #4   Title  Patient to report 75% improvement in catching pain with STS transitions.    Time  6    Period  Weeks    Status  On-going      PT LONG TERM GOAL #5   Title  Patient to report 50% improved confidence when walking without AD d/t improvement in pain levels.    Time  6    Period  Weeks    Status  On-going            Plan - 03/08/19 1114    Clinical Impression Statement  Pt. reporting  she needed to leave early from session today in order to make hair dresser thus tx time limited.  Pt. noting she performed HEP at home since last visit however did require cueing for proper technique and equipment at home with supine HS stretch.  Henchy tolerated all LE strengthening and stretching activities without increased pain today.  Progressing toward STG #1.  Will continue to progress toward goals in coming session.    Personal Factors and Comorbidities  Age;Comorbidity 3+;Time since onset of injury/illness/exacerbation;Fitness;Past/Current Experience    Comorbidities  Liver disease, HTN, HLD, GERD, DM, peripheral neuropathy, depression, dementia, chronic fatigue, RTC repair, knee surgery    Rehab Potential  Good    PT Treatment/Interventions  ADLs/Self Care Home Management;Cryotherapy;Electrical Stimulation;Iontophoresis 4mg /ml Dexamethasone;Moist Heat;Balance training;Therapeutic exercise;Therapeutic activities;Functional mobility training;Stair training;Gait training;Ultrasound;Neuromuscular re-education;Patient/family education;Manual techniques;Taping;Energy conservation;Dry needling;Passive range of motion    PT Next Visit Plan  FOTO    Consulted and Agree with Plan of Care  Patient       Patient will benefit from skilled therapeutic intervention in order to improve the following deficits and impairments:  Hypomobility, Decreased activity tolerance, Decreased strength, Pain, Difficulty walking, Decreased balance, Decreased range of motion, Improper body mechanics, Postural dysfunction, Impaired flexibility  Visit Diagnosis: Pain in left hip  Stiffness of left hip, not elsewhere classified  Muscle weakness (generalized)  Difficulty in walking, not elsewhere classified     Problem List Patient Active Problem List   Diagnosis Date Noted  . Greater trochanteric pain syndrome of left lower extremity 01/18/2019  . Hyperlipidemia 03/26/2018  . Hyponatremia 03/26/2018  . Atypical  chest pain 03/25/2018  . Urge incontinence 02/25/2018  . Glucosuria 02/25/2018  . Anxiety and depression 12/16/2017  . Memory loss 11/02/2017  . B12 deficiency 11/02/2017  .  Vertigo 10/10/2017  . Trochanteric bursitis of left hip 10/10/2017  . Murmur, cardiac 04/15/2017  . Asymptomatic postmenopausal estrogen deficiency 04/15/2017  . Right-sided low back pain without sciatica 02/10/2017  . Grief at loss of child 07/29/2016  . Allergic rhinitis 02/27/2016  . GERD (gastroesophageal reflux disease) 08/06/2015  . Baker's cyst, ruptured 08/03/2015  . Type 2 diabetes mellitus (HCC) 06/29/2015  . Arthritis of left knee 06/15/2015  . Benign essential HTN 02/18/2015  . Cellulitis 02/18/2015  . Sciatica 01/17/2015  . Routine general medical examination at a health care facility 12/26/2014  . Medicare annual wellness visit, subsequent 12/26/2014  . Generalized osteoarthritis of multiple sites 11/25/2014  . GERD with esophagitis 11/25/2014  . Hip arthrosis 11/25/2014  . Diarrhea in adult patient 11/29/2013    Kermit Balo, PTA 03/08/19 12:28 PM   [facility] Outpatient [facility] [address]  [address] [place], [place], [address] Phone: [phone]   Fax:  [phone]  Name: Meredith Phillips MRN: [id number] Date of Birth: June 16, 1946

## 2019-03-10 ENCOUNTER — Ambulatory Visit: Payer: PPO

## 2019-03-15 ENCOUNTER — Ambulatory Visit: Payer: PPO | Admitting: Physical Therapy

## 2019-03-18 ENCOUNTER — Ambulatory Visit: Payer: PPO | Attending: Family Medicine

## 2019-03-18 VITALS — BP 144/78 | HR 68

## 2019-03-18 DIAGNOSIS — R262 Difficulty in walking, not elsewhere classified: Secondary | ICD-10-CM

## 2019-03-18 DIAGNOSIS — M25652 Stiffness of left hip, not elsewhere classified: Secondary | ICD-10-CM | POA: Diagnosis not present

## 2019-03-18 DIAGNOSIS — M25552 Pain in left hip: Secondary | ICD-10-CM | POA: Insufficient documentation

## 2019-03-18 DIAGNOSIS — M6281 Muscle weakness (generalized): Secondary | ICD-10-CM | POA: Insufficient documentation

## 2019-03-18 NOTE — Therapy (Signed)
[facility] [address]  [address] [place], [place], [address] Phone: [phone]   Fax:  [phone]  Physical Therapy Treatment  Patient Details  Name: Meredith Phillips MRN: [id number] Date of Birth: 1946-02-09 Referring Provider (PT): Clare Gandy, MD   Encounter Date: 03/18/2019  PT End of Session - 03/18/19 0952    Visit Number  3    Number of Visits  13    Date for PT Re-Evaluation  04/14/19    Authorization Type  [employer]    PT Start Time  0940   pt. arrived late to session thus tx time limited.   PT Stop Time  1018    PT Time Calculation (min)  38 min    Activity Tolerance  Patient tolerated treatment well;Patient limited by pain    Behavior During Therapy  [facility] for tasks assessed/performed       Past Medical History:  Diagnosis Date  . Arthritis   . Benign essential HTN 02/18/2015  . Cellulitis 02/18/2015  . Chicken pox   . Chronic fatigue   . Dementia (HCC)   . Depression    since last 04/2018 daughter passed.    . Diabetes mellitus without complication (HCC)   . Diabetic peripheral neuropathy (HCC)   . GERD (gastroesophageal reflux disease)   . Hypercholesteremia   . Hypertension   . Liver disease   . Polyp of colon     Past Surgical History:  Procedure Laterality Date  . APPENDECTOMY    . HERNIA REPAIR    . KNEE SURGERY    . ROTATOR CUFF REPAIR    . TONSILLECTOMY      Vitals:   03/18/19 0944 03/18/19 0950  BP: 138/70 (!) 144/78  Pulse: 62 68  SpO2: 98% 98%    Subjective Assessment - 03/18/19 0950    Subjective  Pt. reporting she has been having some issues with high blood pressure recently.    Pertinent History  Liver disease, HTN, HLD, GERD, DM, peripheral neuropathy, depression, dementia, chronic fatigue, RTC repair, knee surgery    Diagnostic tests  01/13/19 L hip xray: No evidence for acute abnormality.    Patient Stated Goals  "i wish my legs were stronger"    Currently in Pain?   Yes    Pain Score  4     Pain Location  Hip    Pain Orientation  Left    Pain Descriptors / Indicators  Sharp    Pain Type  Chronic pain    Aggravating Factors   more when standing    Multiple Pain Sites  No         [facility] PT Assessment - 03/18/19 0001      Observation/Other Assessments   Focus on Therapeutic Outcomes (FOTO)   FOTO hip 30% (70% limitation)                   [facility] Adult PT Treatment/Exercise - 03/18/19 0001      Knee/Hip Exercises: Stretches   Passive Hamstring Stretch  Left;2 reps;30 seconds    Passive Hamstring Stretch Limitations  strap     ITB Stretch  Left;2 reps;30 seconds    ITB Stretch Limitations  strap    Other Knee/Hip Stretches  L figure-4 stretch x 30 sec      Knee/Hip Exercises: Aerobic   Nustep  Lvl 3, 6 min (LE only)  PT Short Term Goals - 03/08/19 1114      PT SHORT TERM GOAL #1   Title  patient to be independent with initial HEP    Time  3    Period  Weeks    Status  On-going    Target Date  03/24/19        PT Long Term Goals - 03/08/19 1114      PT LONG TERM GOAL #1   Title  patient to be independwnt with advanced HEP    Time  6    Period  Weeks    Status  On-going      PT LONG TERM GOAL #2   Title  Patient to demonstrate L hip AROM symmetrical to opposite LE and nonpainful.    Time  6    Period  Weeks    Status  On-going      PT LONG TERM GOAL #3   Title  Patient to demonstrate B LE strength >/=4+/5.    Period  Weeks    Status  On-going      PT LONG TERM GOAL #4   Title  Patient to report 75% improvement in catching pain with STS transitions.    Time  6    Period  Weeks    Status  On-going      PT LONG TERM GOAL #5   Title  Patient to report 50% improved confidence when walking without AD d/t improvement in pain levels.    Time  6    Period  Weeks    Status  On-going            Plan - 03/18/19 1005    Clinical Impression Statement  Treatment time limited today due to  pt. arriving late to session.  Pt. reports MD told her her BP has been elevated and she required blood pressure to be taken to start session.  Blood pressure initially measured at 140/78 to start session and other vitals remained within functional limitations during session.  Remainder of visit spent on L LE stretching and HEP review with pt. demonstrating limited understanding of HEP and likely not performing daily per conversation today.  Pt. reporting she continues to have pain levels nearly unchanged since last session.  Will continue to monitor HEP adherence and progress toward goals in coming session.    Personal Factors and Comorbidities  Age;Comorbidity 3+;Time since onset of injury/illness/exacerbation;Fitness;Past/Current Experience    Comorbidities  Liver disease, HTN, HLD, GERD, DM, peripheral neuropathy, depression, dementia, chronic fatigue, RTC repair, knee surgery    Rehab Potential  Good    PT Treatment/Interventions  ADLs/Self Care Home Management;Cryotherapy;Electrical Stimulation;Iontophoresis 4mg /ml Dexamethasone;Moist Heat;Balance training;Therapeutic exercise;Therapeutic activities;Functional mobility training;Stair training;Gait training;Ultrasound;Neuromuscular re-education;Patient/family education;Manual techniques;Taping;Energy conservation;Dry needling;Passive range of motion    PT Next Visit Plan  Monitor HEP adherence and understanding; LE flexibility work; LE strengthening as pt. tolerates    Consulted and Agree with Plan of Care  Patient       Patient will benefit from skilled therapeutic intervention in order to improve the following deficits and impairments:  Hypomobility, Decreased activity tolerance, Decreased strength, Pain, Difficulty walking, Decreased balance, Decreased range of motion, Improper body mechanics, Postural dysfunction, Impaired flexibility  Visit Diagnosis: Pain in left hip  Stiffness of left hip, not elsewhere classified  Muscle weakness  (generalized)  Difficulty in walking, not elsewhere classified     Problem List Patient Active Problem List   Diagnosis Date Noted  . Greater trochanteric pain  syndrome of left lower extremity 01/18/2019  . Hyperlipidemia 03/26/2018  . Hyponatremia 03/26/2018  . Atypical chest pain 03/25/2018  . Urge incontinence 02/25/2018  . Glucosuria 02/25/2018  . Anxiety and depression 12/16/2017  . Memory loss 11/02/2017  . B12 deficiency 11/02/2017  . Vertigo 10/10/2017  . Trochanteric bursitis of left hip 10/10/2017  . Murmur, cardiac 04/15/2017  . Asymptomatic postmenopausal estrogen deficiency 04/15/2017  . Right-sided low back pain without sciatica 02/10/2017  . Grief at loss of child 07/29/2016  . Allergic rhinitis 02/27/2016  . GERD (gastroesophageal reflux disease) 08/06/2015  . Baker's cyst, ruptured 08/03/2015  . Type 2 diabetes mellitus ([place]) 06/29/2015  . Arthritis of left knee 06/15/2015  . Benign essential HTN 02/18/2015  . Cellulitis 02/18/2015  . Sciatica 01/17/2015  . Routine general medical examination at a health care facility 12/26/2014  . Medicare annual wellness visit, subsequent 12/26/2014  . Generalized osteoarthritis of multiple sites 11/25/2014  . GERD with esophagitis 11/25/2014  . Hip arthrosis 11/25/2014  . Diarrhea in adult patient 11/29/2013    Bess Harvest, PTA 03/18/19 12:16 PM   [facility] [address]  [place], [place], [address] Phone: [phone]   Fax:  [phone]  Name: Meredith Phillips MRN: [id number] Date of Birth: January 16, 1946

## 2019-03-22 ENCOUNTER — Other Ambulatory Visit: Payer: Self-pay

## 2019-03-22 ENCOUNTER — Ambulatory Visit (INDEPENDENT_AMBULATORY_CARE_PROVIDER_SITE_OTHER): Payer: PPO | Admitting: Family

## 2019-03-22 ENCOUNTER — Ambulatory Visit: Payer: PPO

## 2019-03-22 ENCOUNTER — Encounter: Payer: Self-pay | Admitting: Family

## 2019-03-22 VITALS — BP 110/70 | HR 65

## 2019-03-22 VITALS — BP 127/68 | HR 52 | Temp 97.1°F | Resp 16 | Ht 63.0 in | Wt 141.0 lb

## 2019-03-22 DIAGNOSIS — H8110 Benign paroxysmal vertigo, unspecified ear: Secondary | ICD-10-CM | POA: Diagnosis not present

## 2019-03-22 DIAGNOSIS — M25552 Pain in left hip: Secondary | ICD-10-CM

## 2019-03-22 DIAGNOSIS — L853 Xerosis cutis: Secondary | ICD-10-CM

## 2019-03-22 DIAGNOSIS — Z23 Encounter for immunization: Secondary | ICD-10-CM | POA: Diagnosis not present

## 2019-03-22 DIAGNOSIS — R262 Difficulty in walking, not elsewhere classified: Secondary | ICD-10-CM

## 2019-03-22 DIAGNOSIS — M6281 Muscle weakness (generalized): Secondary | ICD-10-CM

## 2019-03-22 DIAGNOSIS — M25652 Stiffness of left hip, not elsewhere classified: Secondary | ICD-10-CM

## 2019-03-22 NOTE — Patient Instructions (Signed)
Continue meclizine as needed for dizziness. Use a good skin moisturizer such as Vaseline intensive care for dry skin or Eucerin.

## 2019-03-22 NOTE — Therapy (Signed)
[facility] Rehabilitation [facility] [address]  [address] [place], [place], [address] Phone: [phone]   Fax:  [phone]  Physical Therapy Treatment  Patient Details  Name: Meredith Phillips MRN: [id number] Date of Birth: [birth year] Referring Provider (PT): Clare Gandy, MD   Encounter Date: 03/22/2019  PT End of Session - 03/22/19 0951    Visit Number  4    Number of Visits  13    Date for PT Re-Evaluation  04/14/19    Authorization Type  [employer]    PT Start Time  0930    PT Stop Time  1011    PT Time Calculation (min)  41 min    Activity Tolerance  Patient tolerated treatment well;Patient limited by pain    Behavior During Therapy  [facility] for tasks assessed/performed       Past Medical History:  Diagnosis Date  . Arthritis   . Benign essential HTN 02/18/2015  . Cellulitis 02/18/2015  . Chicken pox   . Chronic fatigue   . Dementia (HCC)   . Depression    since last 04/2018 daughter passed.    . Diabetes mellitus without complication (HCC)   . Diabetic peripheral neuropathy (HCC)   . GERD (gastroesophageal reflux disease)   . Hypercholesteremia   . Hypertension   . Liver disease   . Polyp of colon     Past Surgical History:  Procedure Laterality Date  . APPENDECTOMY    . HERNIA REPAIR    . KNEE SURGERY    . ROTATOR CUFF REPAIR    . TONSILLECTOMY      Vitals:   03/22/19 0951 03/22/19 0953  BP: 98/68 110/70  Pulse: (!) 56 65  SpO2: 98% 98%    Subjective Assessment - 03/22/19 0951    Subjective  Pt. reporting she has had some lightheadedness earlier this morning however feeling fine now.    Pertinent History  Liver disease, HTN, HLD, GERD, DM, peripheral neuropathy, depression, dementia, chronic fatigue, RTC repair, knee surgery    Diagnostic tests  01/13/19 L hip xray: No evidence for acute abnormality.    Patient Stated Goals  "i wish my legs were stronger"    Currently in Pain?  No/denies    Pain Score  0-No pain     Multiple Pain Sites  No                       [facility] Adult PT Treatment/Exercise - 03/22/19 0001      Knee/Hip Exercises: Stretches   ITB Stretch  Left;2 reps;30 seconds    ITB Stretch Limitations  strap    Piriformis Stretch  Left;2 reps;30 seconds    Piriformis Stretch Limitations  KTOS      Knee/Hip Exercises: Aerobic   Nustep  Lvl 3, 6 min (LE only)      Knee/Hip Exercises: Supine   Bridges  Both;10 reps    Bridges Limitations  Hooklying       Knee/Hip Exercises: Sidelying   Clams  L hip clam shell x 12 rpes                PT Short Term Goals - 03/08/19 1114      PT SHORT TERM GOAL #1   Title  patient to be independent with initial HEP    Time  3    Period  Weeks    Status  On-going    Target Date  03/24/19  PT Long Term Goals - 03/08/19 1114      PT LONG TERM GOAL #1   Title  patient to be independwnt with advanced HEP    Time  6    Period  Weeks    Status  On-going      PT LONG TERM GOAL #2   Title  Patient to demonstrate L hip AROM symmetrical to opposite LE and nonpainful.    Time  6    Period  Weeks    Status  On-going      PT LONG TERM GOAL #3   Title  Patient to demonstrate B LE strength >/=4+/5.    Period  Weeks    Status  On-going      PT LONG TERM GOAL #4   Title  Patient to report 75% improvement in catching pain with STS transitions.    Time  6    Period  Weeks    Status  On-going      PT LONG TERM GOAL #5   Title  Patient to report 50% improved confidence when walking without AD d/t improvement in pain levels.    Time  6    Period  Weeks    Status  On-going            Plan - 03/22/19 0955    Clinical Impression Statement  Pt. arrived to session with low blood pressure 98/26mmHg and reports she had had a short bout of lightheadedness earlier this morning.  Discussion revealed pt. did not hydrate this morning and pt. blood pressure much improved to 110/62mmHg after drinking cup of water.  Session  time somewhat limited today due to late start after monitoring vitals however pt. BP remained within therapeutic limitations for remainder of session.  Duration of session focused on gentle LE flexibility and lumbopelvic strengthening activities.  Pt. admitting to poor HEP adherence thus reviewed/performed some of HEP today with pt. with min cueing required at times for proper positioning.  Ended visit with pt. denying pain thus modalities deferred.    Personal Factors and Comorbidities  Age;Comorbidity 3+;Time since onset of injury/illness/exacerbation;Fitness;Past/Current Experience    Comorbidities  Liver disease, HTN, HLD, GERD, DM, peripheral neuropathy, depression, dementia, chronic fatigue, RTC repair, knee surgery    Rehab Potential  Good    PT Treatment/Interventions  ADLs/Self Care Home Management;Cryotherapy;Electrical Stimulation;Iontophoresis 4mg /ml Dexamethasone;Moist Heat;Balance training;Therapeutic exercise;Therapeutic activities;Functional mobility training;Stair training;Gait training;Ultrasound;Neuromuscular re-education;Patient/family education;Manual techniques;Taping;Energy conservation;Dry needling;Passive range of motion    PT Next Visit Plan  Monitor HEP adherence and understanding; LE flexibility work; LE strengthening as pt. tolerates    Consulted and Agree with Plan of Care  Patient       Patient will benefit from skilled therapeutic intervention in order to improve the following deficits and impairments:  Hypomobility, Decreased activity tolerance, Decreased strength, Pain, Difficulty walking, Decreased balance, Decreased range of motion, Improper body mechanics, Postural dysfunction, Impaired flexibility  Visit Diagnosis: Pain in left hip  Stiffness of left hip, not elsewhere classified  Muscle weakness (generalized)  Difficulty in walking, not elsewhere classified     Problem List Patient Active Problem List   Diagnosis Date Noted  . Greater trochanteric  pain syndrome of left lower extremity 01/18/2019  . Hyperlipidemia 03/26/2018  . Hyponatremia 03/26/2018  . Atypical chest pain 03/25/2018  . Urge incontinence 02/25/2018  . Glucosuria 02/25/2018  . Anxiety and depression 12/16/2017  . Memory loss 11/02/2017  . B12 deficiency 11/02/2017  . Vertigo 10/10/2017  . Trochanteric  bursitis of left hip 10/10/2017  . Murmur, cardiac 04/15/2017  . Asymptomatic postmenopausal estrogen deficiency 04/15/2017  . Right-sided low back pain without sciatica 02/10/2017  . Grief at loss of child 07/29/2016  . Allergic rhinitis 02/27/2016  . GERD (gastroesophageal reflux disease) 08/06/2015  . Baker's cyst, ruptured 08/03/2015  . Type 2 diabetes mellitus (HCC) 06/29/2015  . Arthritis of left knee 06/15/2015  . Benign essential HTN 02/18/2015  . Cellulitis 02/18/2015  . Sciatica 01/17/2015  . Routine general medical examination at a health care facility 12/26/2014  . Medicare annual wellness visit, subsequent 12/26/2014  . Generalized osteoarthritis of multiple sites 11/25/2014  . GERD with esophagitis 11/25/2014  . Hip arthrosis 11/25/2014  . Diarrhea in adult patient 11/29/2013    Kermit Balo, PTA 03/22/19 12:43 PM   [facility] [address]  [address] [place], [place], [address] Phone: [phone]   Fax:  [phone]  Name: Meredith Phillips MRN: [id number] Date of Birth: Mar 12, 1946

## 2019-03-22 NOTE — Progress Notes (Signed)
Subjective:    Patient ID: Meredith Phillips, female    DOB: [birth year], 73 y.o.   MRN: [id number]  HPI  Patient is a 73 yr old female with hx significant for HTN, Hyperlipidemia, DM2, hyperlipidemia and dementia, who presents today with chief complaint of dizziness. Patient is a poor historian. Reports that dizziness has been present off and on for about 5 years. Reports that she uses meclizine prn.  She reports symptoms are mild today.    Skin problem- she reports that she feels like the skin on her arms is darker than her legs.    Review of Systems    see HPI  Past Medical History:  Diagnosis Date  . Arthritis   . Benign essential HTN 02/18/2015  . Cellulitis 02/18/2015  . Chicken pox   . Chronic fatigue   . Dementia ([place])   . Depression    since last 04/2018 daughter passed.    . Diabetes mellitus without complication ([place])   . Diabetic peripheral neuropathy ([place])   . GERD (gastroesophageal reflux disease)   . Hypercholesteremia   . Hypertension   . Liver disease   . Polyp of colon      Social History   Socioeconomic History  . Marital status: Married    Spouse name: Not on file  . Number of children: 5  . Years of education: 9  . Highest education level: Not on file  Occupational History  . Occupation: Retired  Scientific laboratory technician  . Financial resource strain: Not on file  . Food insecurity    Worry: Not on file    Inability: Not on file  . Transportation needs    Medical: Not on file    Non-medical: Not on file  Tobacco Use  . Smoking status: Never Smoker  . Smokeless tobacco: Never Used  Substance and Sexual Activity  . Alcohol use: No  . Drug use: No  . Sexual activity: Not on file  Lifestyle  . Physical activity    Days per week: Not on file    Minutes per session: Not on file  . Stress: Not on file  Relationships  . Social Herbalist on phone: Not on file    Gets together: Not on file    Attends religious service: Not on file    Active  member of club or organization: Not on file    Attends meetings of clubs or organizations: Not on file    Relationship status: Not on file  . Intimate partner violence    Fear of current or ex partner: Not on file    Emotionally abused: Not on file    Physically abused: Not on file    Forced sexual activity: Not on file  Other Topics Concern  . Not on file  Social History Narrative   Fun: paint, word search   Feels safe where she is living and no concern for abuse   Denies religious beliefs effecting healthcare.     Past Surgical History:  Procedure Laterality Date  . APPENDECTOMY    . HERNIA REPAIR    . KNEE SURGERY    . ROTATOR CUFF REPAIR    . TONSILLECTOMY      Family History  Problem Relation Age of Onset  . Healthy Mother   . Healthy Father   . Healthy Paternal Grandmother   . Healthy Paternal Grandfather     Allergies  Allergen Reactions  . Codeine Other (See Comments)  Woke up the next morning and did not know where she was  . Other     Doesn't like to take pain medication due to hallucinations.  . Shellfish Allergy     Current Outpatient Medications on File Prior to Visit  Medication Sig Dispense Refill  . acetaZOLAMIDE (DIAMOX) 250 MG tablet TAKE 1 TABLET BY MOUTH TWICE A DAY 30 tablet 1  . amLODipine (NORVASC) 10 MG tablet Take 1 tablet (10 mg total) by mouth daily. 90 tablet 1  . aspirin EC 81 MG tablet Take 81 mg by mouth daily.    [name] atorvastatin (LIPITOR) 20 MG tablet Take 1 tablet (20 mg total) by mouth daily. 90 tablet 3  . Blood Glucose Monitoring Suppl (ONE TOUCH ULTRA 2) w/Device KIT Use to check blood sugars twice a day Dx E11.9 1 each 0  . Cholecalciferol (VITAMIN D-3) 1000 UNITS CAPS Take 1,000 Units by mouth.    . cloNIDine (CATAPRES) 0.1 MG tablet Take 1 tablet (0.1 mg total) by mouth 2 (two) times daily. 60 tablet 5  . cyanocobalamin 1000 MCG tablet Take 1,000 mcg by mouth daily.    [name] esomeprazole (NEXIUM) 40 MG capsule Take 1 capsule (40  mg total) by mouth daily at 12 noon. 90 capsule 1  . famotidine (PEPCID) 20 MG tablet TAKE 1 TABLET BY MOUTH TWICE A DAY 30 tablet 0  . gabapentin (NEURONTIN) 300 MG capsule Take 1 capsule (300 mg total) by mouth 3 (three) times daily. TAKE 1 CAPSULE BY MOUTH THREE TIMES A DAY 270 capsule 0  . glucose blood (ONE TOUCH ULTRA TEST) test strip USE TO CHECK BLOOD SUGARS TWICE A DAY DX E11.9 100 each 1  . Lancets (ONETOUCH ULTRASOFT) lancets USE TO HELP CHECK BLOOD SUGARS TWICE A DAY E11.9 100 each 1  . meclizine (ANTIVERT) 25 MG tablet TAKE 1/2 TO 1 TABLET BY MOUTH TWICE A DAY AS NEEDED FOR DIZZINESS 30 tablet 0  . meloxicam (MOBIC) 7.5 MG tablet TAKE 1 TABLET BY MOUTH EVERY DAY 30 tablet 0  . metoprolol succinate (TOPROL-XL) 25 MG 24 hr tablet Take 1 tablet (25 mg total) by mouth daily. 30 tablet 5  . Multiple Vitamin (MULTIVITAMIN) tablet Take 1 tablet by mouth daily.    . sitaGLIPtin (JANUVIA) 100 MG tablet Take 1 tablet (100 mg total) by mouth daily. 90 tablet 1  . terconazole (TERAZOL 3) 0.8 % vaginal cream Place 1 applicator vaginally at bedtime. 20 g 0   No current facility-administered medications on file prior to visit.     BP 127/68 (BP Location: Right Arm, Patient Position: Sitting, Cuff Size: Small)   Pulse (!) 52   Temp (!) 97.1 F (36.2 C) (Temporal)   Resp 16   Ht _0  (1.6 m)   Wt 141 lb (64 kg)   SpO2 100%   BMI 24.98 kg/m    Objective:   Physical Exam Constitutional:      Appearance: She is well-developed.  Neck:     Musculoskeletal: Neck supple.     Thyroid: No thyromegaly.  Cardiovascular:     Rate and Rhythm: Normal rate and regular rhythm.     Pulses:          Carotid pulses are 2+ on the right side and 2+ on the left side.    Heart sounds: Normal heart sounds. No murmur.     Comments: No carotid bruits noted Pulmonary:     Effort: Pulmonary effort is normal. No respiratory distress.  Breath sounds: Normal breath sounds. No wheezing.  Skin:     General: Skin is warm and dry.     Comments: Skin appears dry, no concerning lesions. I think her hands/forearms are more sun-tanned than her legs.   Neurological:     Mental Status: She is alert and oriented to person, place, and time.     Deep Tendon Reflexes:     Reflex Scores:      Patellar reflexes are 2+ on the right side and 2+ on the left side.    Comments: Neg dix hall-pike maneuver bilaterally Bilateral UE/LE strength is 5/5 Speech is clear  Psychiatric:        Behavior: Behavior normal.        Thought Content: Thought content normal.        Judgment: Judgment normal.           Assessment & Plan:  Vertigo- no symptoms today. Neuro exam WNL.  Advised pt to use meclizine PRN.  Dry skin- recommended a good moisturizer.  Reassurance provided.

## 2019-03-25 ENCOUNTER — Ambulatory Visit: Payer: PPO

## 2019-03-27 ENCOUNTER — Other Ambulatory Visit: Payer: Self-pay | Admitting: Medical

## 2019-03-28 ENCOUNTER — Ambulatory Visit (INDEPENDENT_AMBULATORY_CARE_PROVIDER_SITE_OTHER): Payer: PPO | Admitting: Medical

## 2019-03-28 ENCOUNTER — Telehealth: Payer: Self-pay | Admitting: Medical

## 2019-03-28 ENCOUNTER — Other Ambulatory Visit: Payer: Self-pay

## 2019-03-28 ENCOUNTER — Encounter: Payer: Self-pay | Admitting: Medical

## 2019-03-28 VITALS — Temp 98.6°F

## 2019-03-28 DIAGNOSIS — R5383 Other fatigue: Secondary | ICD-10-CM

## 2019-03-28 DIAGNOSIS — R6883 Chills (without fever): Secondary | ICD-10-CM | POA: Diagnosis not present

## 2019-03-28 MED ORDER — MECLIZINE HCL 25 MG PO TABS: ORAL_TABLET | ORAL | 0 refills | Status: DC

## 2019-03-28 MED ORDER — MELOXICAM 7.5 MG PO TABS: 7.5000 mg | ORAL_TABLET | Freq: Every day | ORAL | 0 refills | Status: DC

## 2019-03-28 NOTE — Progress Notes (Signed)
Meredith Phillips [id number] - 72 y.o. female MRN [id number]  Date of birth: December 29, 1945  SUBJECTIVE:  Including CC & ROS.  Chief Complaint  Patient presents with  . Follow-up    follow up for right hip and knee    Meredith Phillips is a 73 y.o. female that is presenting with a fall and head pain as well as left lateral hip pain and knee pain.  She had a fall last Thursday where she fell forward and hit the side of her face.  She is having pain and tenderness over the temple as well as the left mandible and jaw.  She denies syncope.  She hit the floor.  Denies any changes in her vision or photophobia.  Denies a headache.  Has acute worsening of the left lateral hip pain.  Has received an injection which seemed to improve some of her pain.  Has been through a couple episodes of physical therapy.  Has some pain over the lateral aspect of the knee as well.  Denies any mechanical symptoms.  Pain seems to be worse if transitioning from sitting to standing.  Also worse if walking for period of time.  No radicular symptoms.  Denies any numbness or tingling.  No saddle anesthesia or urinary incontinence.  Independent review of the left hip x-ray from 01/13/2019 shows mild degenerative changes in the joint  Independent review of the left knee x-ray from 04/02/2018 shows no acute normality's.  Shows mild to moderate degenerative changes of the medial joint line.  Review of Systems  Constitutional: Negative for fever.  HENT: Negative for congestion.   Respiratory: Negative for cough.   Cardiovascular: Negative for chest pain.  Gastrointestinal: Negative for abdominal pain.  Musculoskeletal: Positive for gait problem.  Skin: Negative for color change.  Neurological: Negative for weakness.  Hematological: Negative for adenopathy.    HISTORY: Past Medical, Surgical, Social, and Family History Reviewed & Updated per EMR.   Pertinent Historical Findings include:  Past Medical History:  Diagnosis Date  . Arthritis    . Benign essential HTN 02/18/2015  . Cellulitis 02/18/2015  . Chicken pox   . Chronic fatigue   . Dementia (HCC)   . Depression    since last 04/2018 daughter passed.    . Diabetes mellitus without complication (HCC)   . Diabetic peripheral neuropathy (HCC)   . GERD (gastroesophageal reflux disease)   . Hypercholesteremia   . Hypertension   . Liver disease   . Polyp of colon     Past Surgical History:  Procedure Laterality Date  . APPENDECTOMY    . HERNIA REPAIR    . KNEE SURGERY    . ROTATOR CUFF REPAIR    . TONSILLECTOMY      Allergies  Allergen Reactions  . Codeine Other (See Comments)    Woke up the next morning and did not know where she was  . Other     Doesn't like to take pain medication due to hallucinations.  . Shellfish Allergy     Family History  Problem Relation Age of Onset  . Healthy Mother   . Healthy Father   . Healthy Paternal Grandmother   . Healthy Paternal Grandfather      Social History   Socioeconomic History  . Marital status: Married    Spouse name: Not on file  . Number of children: 5  . Years of education: 95  . Highest education level: Not on file  Occupational History  . Occupation: Retired  Social Needs  . Financial resource strain: Not on file  . Food insecurity    Worry: Not on file    Inability: Not on file  . Transportation needs    Medical: Not on file    Non-medical: Not on file  Tobacco Use  . Smoking status: Never Smoker  . Smokeless tobacco: Never Used  Substance and Sexual Activity  . Alcohol use: No  . Drug use: No  . Sexual activity: Not on file  Lifestyle  . Physical activity    Days per week: Not on file    Minutes per session: Not on file  . Stress: Not on file  Relationships  . Social Herbalist on phone: Not on file    Gets together: Not on file    Attends religious service: Not on file    Active member of club or organization: Not on file    Attends meetings of clubs or organizations:  Not on file    Relationship status: Not on file  . Intimate partner violence    Fear of current or ex partner: Not on file    Emotionally abused: Not on file    Physically abused: Not on file    Forced sexual activity: Not on file  Other Topics Concern  . Not on file  Social History Narrative   Fun: paint, word search   Feels safe where she is living and no concern for abuse   Denies religious beliefs effecting healthcare.      PHYSICAL EXAM:  VS: BP (!) 144/85   Pulse (!) 56   Ht 5\' 3"  (1.6 m)   Wt 141 lb (64 kg)   BMI 24.98 kg/m  Physical Exam Gen: NAD, alert, cooperative with exam, well-appearing ENT: normal lips, normal nasal mucosa,  Eye: normal EOM, normal conjunctiva and lids CV:  no edema, +2 pedal pulses   Resp: no accessory muscle use, non-labored,  Skin: no rashes, no areas of induration  Neuro: normal tone, normal sensation to touch Psych:  normal insight, alert and oriented MSK:  Head: Tenderness to palpation over the mandible and left temple. No crepitus appreciated. No ecchymosis or swelling. Left hip: Tenderness to palpation of the greater trochanter. Normal internal and external rotation. Normal strength resistance with hip flexion, knee flexion and extension Negative Noble's test at the knee. Negative straight leg raise. Neurovascularly intact     ASSESSMENT & PLAN:   It band syndrome, left Having pain on the lateral aspect of the knee which would be suggestive of IT band syndrome.  May be resulting from the weakness with hip abduction proximally.  Imaging previously done showed degenerative changes but no effusion today.  Less likely to be the source of her pain. -Counseled on home exercise therapy and supportive care. -Can consider injection and updated imaging.  Greater trochanteric pain syndrome of left lower extremity Pain is still occurring over the lateral aspect.  Seems to be most consistent with greater trochanteric pain.  Possibly  related to the lower back. -Prednisone. -Counseled on home exercise therapy and supportive care. -Could consider a hip joint injection if no improvement. -Can continue physical therapy.  Head trauma She fell last Thursday and hit the left side of her face and jaw.  Having tenderness over the mandible and on the left temple region.  No changes in her vision or ocular movements. -CT maxillofacial. -Counseled on supportive care.

## 2019-03-28 NOTE — Patient Instructions (Addendum)
Patient has recent symptoms of chills, frequent urination and some fatigue.  The symptoms are not entirely new but she is complained of these symptoms in the past.  Broad differential diagnoses and I do think it would be best for her to be seen in the office later this week to get exact diagnosis.  However due to the pandemic situation I think it is best to go ahead and get her tested for Covid tomorrow morning at the drive-through test center so I can see her later in the week.  Asked her to notify me if she has any worsening signs/symptoms pending Covid test results.  If studies negative and her signs/symptoms do not worsen none we will plan to see her in the office and likely get CBC, CMP, A1c, UA and urine culture.  Patient does have some chronic intermittent vertigo in the past and I did refill her meclizine..  Also refilled her meloxicam.  Follow-up this Friday at 8:40 AM.  Possibly sooner if we get the Covid test results back.  Instructed her husband on location of test center and process.

## 2019-03-28 NOTE — Progress Notes (Signed)
Subjective:    Patient ID: Meredith Phillips, female    DOB: 08/25/1945, 73 y.o.   MRN: [id number]  HPI Virtual Visit via Telephone Note  I connected with Alyssha Housh Dom on 03/28/19 at  3:20 PM EDT by telephone and verified that I am speaking with the correct person using two identifiers.  Location: Patient: home Provider: office   I discussed the limitations, risks, security and privacy concerns of performing an evaluation and management service by telephone and the availability of in person appointments. I also discussed with the patient that there may be a patient responsible charge related to this service. The patient expressed understanding and agreed to proceed.   History of Present Illness:  Pt states she has some chills since spring. Denies having sweats or fevers.She states having chills since getting established here but worse past 2 weeks. She states has frequent urination(no polyphagia or polydipsia reported). Frequent urination increase past 3 weeks. Getting worse. No pain when urinates. No suprapubic pain. Pt stats little more tired than usual.   Pt has hx of benign positional vertigo. Pt seen on Mar 22, 2019 by NP Melissa. Pt has rx of meclizine to use for dizziness.     Observations/Objective:  General- no acute distress, pleasant, normal speech.   Assessment and Plan: Patient has recent symptoms of chills, frequent urination and some fatigue.  The symptoms are not entirely new but she is complained of these symptoms in the past.  Broad differential diagnoses and I do think it would be best for her to be seen in the office later this week to get exact diagnosis.  However due to the pandemic situation I think it is best to go ahead and get her tested for Covid tomorrow morning at the drive-through test center so I can see her later in the week.  Asked her to notify me if she has any worsening signs/symptoms pending Covid test results.  If studies negative and her  signs/symptoms do not worsen none we will plan to see her in the office and likely get CBC, CMP, A1c, UA and urine culture.  Patient does have some chronic intermittent vertigo in the past and I did refill her meclizine..  Also refilled her meloxicam.  Follow-up this Friday at 8:40 AM.  Possibly sooner if we get the Covid test results back.  Instructed her husband on location of test center and process.  Follow Up Instructions:    I discussed the assessment and treatment plan with the patient. The patient was provided an opportunity to ask questions and all were answered. The patient agreed with the plan and demonstrated an understanding of the instructions.   The patient was advised to call back or seek an in-person evaluation if the symptoms worsen or if the condition fails to improve as anticipated.  I provided 25 minutes of non-face-to-face time during this encounter.   Mackie Pai, PA-C    Review of Systems  Constitutional: Positive for chills and fatigue. Negative for diaphoresis and unexpected weight change.  HENT: Negative for congestion, ear pain, postnasal drip, rhinorrhea, sinus pressure and sinus pain.   Respiratory: Negative for cough, chest tightness, shortness of breath and wheezing.   Cardiovascular: Negative for chest pain and palpitations.  Gastrointestinal: Negative for abdominal pain and diarrhea.  Musculoskeletal: Negative for back pain, neck pain and neck stiffness.       Some muscle pain in back but worse since she had therapy.  Skin: Negative for rash.  Neurological:       See hpi. Hx of vertigo.  Hematological: Negative for adenopathy. Does not bruise/bleed easily.  Psychiatric/Behavioral: Negative for behavioral problems and decreased concentration.   Past Medical History:  Diagnosis Date  . Arthritis   . Benign essential HTN 02/18/2015  . Cellulitis 02/18/2015  . Chicken pox   . Chronic fatigue   . Dementia ([facility])   . Depression    since last  04/2018 daughter passed.    . Diabetes mellitus without complication ([facility])   . Diabetic peripheral neuropathy ([facility])   . GERD (gastroesophageal reflux disease)   . Hypercholesteremia   . Hypertension   . Liver disease   . Polyp of colon      Social History   Socioeconomic History  . Marital status: Married    Spouse name: Not on file  . Number of children: 5  . Years of education: 10  . Highest education level: Not on file  Occupational History  . Occupation: Retired  Scientific laboratory technician  . Financial resource strain: Not on file  . Food insecurity    Worry: Not on file    Inability: Not on file  . Transportation needs    Medical: Not on file    Non-medical: Not on file  Tobacco Use  . Smoking status: Never Smoker  . Smokeless tobacco: Never Used  Substance and Sexual Activity  . Alcohol use: No  . Drug use: No  . Sexual activity: Not on file  Lifestyle  . Physical activity    Days per week: Not on file    Minutes per session: Not on file  . Stress: Not on file  Relationships  . Social Herbalist on phone: Not on file    Gets together: Not on file    Attends religious service: Not on file    Active member of club or organization: Not on file    Attends meetings of clubs or organizations: Not on file    Relationship status: Not on file  . Intimate partner violence    Fear of current or ex partner: Not on file    Emotionally abused: Not on file    Physically abused: Not on file    Forced sexual activity: Not on file  Other Topics Concern  . Not on file  Social History Narrative   Fun: paint, word search   Feels safe where she is living and no concern for abuse   Denies religious beliefs effecting healthcare.     Past Surgical History:  Procedure Laterality Date  . APPENDECTOMY    . HERNIA REPAIR    . KNEE SURGERY    . ROTATOR CUFF REPAIR    . TONSILLECTOMY      Family History  Problem Relation Age of Onset  . Healthy Mother   . Healthy Father    . Healthy Paternal Grandmother   . Healthy Paternal Grandfather     Allergies  Allergen Reactions  . Codeine Other (See Comments)    Woke up the next morning and did not know where she was  . Other     Doesn't like to take pain medication due to hallucinations.  . Shellfish Allergy     Current Outpatient Medications on File Prior to Visit  Medication Sig Dispense Refill  . acetaZOLAMIDE (DIAMOX) 250 MG tablet TAKE 1 TABLET BY MOUTH TWICE A DAY 30 tablet 1  . amLODipine (NORVASC) 10 MG tablet Take 1 tablet (10 mg  total) by mouth daily. 90 tablet 1  . aspirin EC 81 MG tablet Take 81 mg by mouth daily.    [name] atorvastatin (LIPITOR) 20 MG tablet Take 1 tablet (20 mg total) by mouth daily. 90 tablet 3  . Blood Glucose Monitoring Suppl (ONE TOUCH ULTRA 2) w/Device KIT Use to check blood sugars twice a day Dx E11.9 1 each 0  . Cholecalciferol (VITAMIN D-3) 1000 UNITS CAPS Take 1,000 Units by mouth.    . cloNIDine (CATAPRES) 0.1 MG tablet Take 1 tablet (0.1 mg total) by mouth 2 (two) times daily. 60 tablet 5  . cyanocobalamin 1000 MCG tablet Take 1,000 mcg by mouth daily.    [name] esomeprazole (NEXIUM) 40 MG capsule Take 1 capsule (40 mg total) by mouth daily at 12 noon. 90 capsule 1  . famotidine (PEPCID) 20 MG tablet TAKE 1 TABLET BY MOUTH TWICE A DAY 30 tablet 0  . gabapentin (NEURONTIN) 300 MG capsule Take 1 capsule (300 mg total) by mouth 3 (three) times daily. TAKE 1 CAPSULE BY MOUTH THREE TIMES A DAY 270 capsule 0  . glucose blood (ONE TOUCH ULTRA TEST) test strip USE TO CHECK BLOOD SUGARS TWICE A DAY DX E11.9 100 each 1  . Lancets (ONETOUCH ULTRASOFT) lancets USE TO HELP CHECK BLOOD SUGARS TWICE A DAY E11.9 100 each 1  . meclizine (ANTIVERT) 25 MG tablet TAKE 1/2 TO 1 TABLET BY MOUTH TWICE A DAY AS NEEDED FOR DIZZINESS 30 tablet 0  . meloxicam (MOBIC) 7.5 MG tablet TAKE 1 TABLET BY MOUTH EVERY DAY 30 tablet 0  . metoprolol succinate (TOPROL-XL) 25 MG 24 hr tablet Take 1 tablet (25 mg total)  by mouth daily. 30 tablet 5  . Multiple Vitamin (MULTIVITAMIN) tablet Take 1 tablet by mouth daily.    . sitaGLIPtin (JANUVIA) 100 MG tablet Take 1 tablet (100 mg total) by mouth daily. 90 tablet 1  . terconazole (TERAZOL 3) 0.8 % vaginal cream Place 1 applicator vaginally at bedtime. 20 g 0   No current facility-administered medications on file prior to visit.     There were no vitals taken for this visit.      Objective:   Physical Exam        Assessment & Plan:

## 2019-03-29 ENCOUNTER — Other Ambulatory Visit: Payer: Self-pay

## 2019-03-29 ENCOUNTER — Ambulatory Visit (HOSPITAL_BASED_OUTPATIENT_CLINIC_OR_DEPARTMENT_OTHER)
Admission: RE | Admit: 2019-03-29 | Discharge: 2019-03-29 | Disposition: A | Discharge status: Home / Self Care | Payer: PPO | Source: Ambulatory Visit | Attending: Family Medicine | Admitting: Family Medicine

## 2019-03-29 ENCOUNTER — Encounter: Payer: Self-pay | Admitting: Family Medicine

## 2019-03-29 ENCOUNTER — Ambulatory Visit (INDEPENDENT_AMBULATORY_CARE_PROVIDER_SITE_OTHER): Payer: PPO | Admitting: Family Medicine

## 2019-03-29 ENCOUNTER — Telehealth: Payer: Self-pay | Admitting: Family Medicine

## 2019-03-29 ENCOUNTER — Ambulatory Visit: Payer: PPO

## 2019-03-29 VITALS — BP 144/85 | HR 56 | Ht 63.0 in | Wt 141.0 lb

## 2019-03-29 DIAGNOSIS — S0990XA Unspecified injury of head, initial encounter: Secondary | ICD-10-CM | POA: Insufficient documentation

## 2019-03-29 DIAGNOSIS — Z20828 Contact with and (suspected) exposure to other viral communicable diseases: Secondary | ICD-10-CM | POA: Diagnosis not present

## 2019-03-29 DIAGNOSIS — M25552 Pain in left hip: Secondary | ICD-10-CM | POA: Diagnosis not present

## 2019-03-29 DIAGNOSIS — S0993XA Unspecified injury of face, initial encounter: Secondary | ICD-10-CM | POA: Diagnosis not present

## 2019-03-29 DIAGNOSIS — M7632 Iliotibial band syndrome, left leg: Secondary | ICD-10-CM

## 2019-03-29 DIAGNOSIS — R519 Headache, unspecified: Secondary | ICD-10-CM | POA: Diagnosis not present

## 2019-03-29 DIAGNOSIS — Z20822 Contact with and (suspected) exposure to covid-19: Secondary | ICD-10-CM

## 2019-03-29 DIAGNOSIS — R220 Localized swelling, mass and lump, head: Secondary | ICD-10-CM | POA: Diagnosis not present

## 2019-03-29 MED ORDER — PREDNISONE 5 MG PO TABS: ORAL_TABLET | ORAL | 0 refills | Status: DC

## 2019-03-29 NOTE — Patient Instructions (Signed)
Good to see you Please try ice over the hip  Please try the exercises Please try voltaren over the counter  Please send me a message in MyChart with any questions or updates.  Please see me back in 4 weeks.  I will call you with the results from today.   --Dr. Raeford Razor

## 2019-03-29 NOTE — Telephone Encounter (Signed)
Unable to leave VM for patient. If she  calls back please have her speak with a nurse/CMA and inform that her CT is normal.   If any questions then please take the best time and phone number to call and I will try to call her back.   Rosemarie Ax, MD [facility] 03/29/2019, 2:49 PM

## 2019-03-29 NOTE — Assessment & Plan Note (Signed)
Having pain on the lateral aspect of the knee which would be suggestive of IT band syndrome.  May be resulting from the weakness with hip abduction proximally.  Imaging previously done showed degenerative changes but no effusion today.  Less likely to be the source of her pain. -Counseled on home exercise therapy and supportive care. -Can consider injection and updated imaging.

## 2019-03-29 NOTE — Assessment & Plan Note (Signed)
She fell last Thursday and hit the left side of her face and jaw.  Having tenderness over the mandible and on the left temple region.  No changes in her vision or ocular movements. -CT maxillofacial. -Counseled on supportive care.

## 2019-03-29 NOTE — Assessment & Plan Note (Signed)
Pain is still occurring over the lateral aspect.  Seems to be most consistent with greater trochanteric pain.  Possibly related to the lower back. -Prednisone. -Counseled on home exercise therapy and supportive care. -Could consider a hip joint injection if no improvement. -Can continue physical therapy.

## 2019-03-31 LAB — NOVEL CORONAVIRUS, NAA: SARS-CoV-2, NAA: NOT DETECTED

## 2019-04-01 ENCOUNTER — Telehealth: Payer: Self-pay | Admitting: Medical

## 2019-04-01 ENCOUNTER — Encounter: Payer: Self-pay | Admitting: Medical

## 2019-04-01 ENCOUNTER — Other Ambulatory Visit: Payer: Self-pay | Admitting: Medical

## 2019-04-01 ENCOUNTER — Ambulatory Visit (INDEPENDENT_AMBULATORY_CARE_PROVIDER_SITE_OTHER): Payer: PPO | Admitting: Medical

## 2019-04-01 ENCOUNTER — Ambulatory Visit: Payer: PPO | Admitting: Physical Therapy

## 2019-04-01 ENCOUNTER — Other Ambulatory Visit: Payer: Self-pay

## 2019-04-01 VITALS — BP 129/71 | HR 58 | Temp 96.9°F | Resp 16 | Ht 63.0 in | Wt 140.2 lb

## 2019-04-01 DIAGNOSIS — R5383 Other fatigue: Secondary | ICD-10-CM

## 2019-04-01 DIAGNOSIS — Z23 Encounter for immunization: Secondary | ICD-10-CM | POA: Diagnosis not present

## 2019-04-01 DIAGNOSIS — R6883 Chills (without fever): Secondary | ICD-10-CM | POA: Diagnosis not present

## 2019-04-01 DIAGNOSIS — D649 Anemia, unspecified: Secondary | ICD-10-CM

## 2019-04-01 DIAGNOSIS — E118 Type 2 diabetes mellitus with unspecified complications: Secondary | ICD-10-CM

## 2019-04-01 DIAGNOSIS — R6884 Jaw pain: Secondary | ICD-10-CM | POA: Diagnosis not present

## 2019-04-01 DIAGNOSIS — R350 Frequency of micturition: Secondary | ICD-10-CM

## 2019-04-01 DIAGNOSIS — K2100 Gastro-esophageal reflux disease with esophagitis, without bleeding: Secondary | ICD-10-CM

## 2019-04-01 LAB — CBC
HCT: 38.6 % (ref 35.0–45.0)
Hemoglobin: 12.3 g/dL (ref 11.7–15.5)
MCH: 28.7 pg (ref 27.0–33.0)
MCHC: 31.9 g/dL — ABNORMAL LOW (ref 32.0–36.0)
MCV: 90.2 fL (ref 80.0–100.0)
MPV: 9.4 fL (ref 7.5–12.5)
Platelets: 346 10*3/uL (ref 140–400)
RBC: 4.28 10*6/uL (ref 3.80–5.10)
RDW: 12.3 % (ref 11.0–15.0)
WBC: 6.8 10*3/uL (ref 3.8–10.8)

## 2019-04-01 LAB — POC URINALSYSI DIPSTICK (AUTOMATED)
Bilirubin, UA: NEGATIVE
Blood, UA: NEGATIVE
Glucose, UA: NEGATIVE
Ketones, UA: NEGATIVE
Nitrite, UA: NEGATIVE
Protein, UA: POSITIVE — AB
Spec Grav, UA: <=1.005 — AB (ref 1.010–1.025)
Urobilinogen, UA: 0.2 E.U./dL
pH, UA: 7.5 (ref 5.0–8.0)

## 2019-04-01 MED ORDER — BLOOD GLUCOSE METER KIT: PACK | 0 refills | Status: AC

## 2019-04-01 NOTE — Patient Instructions (Addendum)
For frequent urination will get ua and urine culture. Not complaining today as before so will wait for culture result before potentially treating.  For fatigue will get cbc and cmp.  For diabetes hx will get a1c. Continue current diabetic meds pending review of results.  Pt likely has contusion pain left mandible as her ct facial bones was negative.  Follow up date to be determined after lab review.

## 2019-04-01 NOTE — Addendum Note (Signed)
Addended by: Caffie Pinto on: 04/01/2019 02:48 PM   Modules accepted: Orders

## 2019-04-01 NOTE — Telephone Encounter (Signed)
Pt did not get labs I advised her to get today. Wanted cbc, cmp, a1c, ua and urine culture. Can she come back today. If not let me know you contacted her and will place future labs. Maybe talk with her husband. I think she forgot today after I told her to go to lab?

## 2019-04-01 NOTE — Addendum Note (Signed)
Addended by: Caffie Pinto on: 04/01/2019 03:01 PM   Modules accepted: Orders

## 2019-04-01 NOTE — Addendum Note (Signed)
Addended by: Caffie Pinto on: 04/01/2019 02:54 PM   Modules accepted: Orders

## 2019-04-01 NOTE — Addendum Note (Signed)
Addended by: Caffie Pinto on: 04/01/2019 02:47 PM   Modules accepted: Orders

## 2019-04-01 NOTE — Progress Notes (Signed)
Subjective:    Patient ID: Meredith Phillips, female    DOB: 05-11-1946, 73 y.o.   MRN: [id number]  HPI  Pt is in for follow up form last visit. Last visit hpi.  "Pt states she has some chills since spring. Denies having sweats or fevers.She states having chills since getting established here but worse past 2 weeks. She states has frequent urination(no polyphagia or polydipsia reported). Frequent urination increase past 3 weeks. Getting worse. No pain when urinates. No suprapubic pain. Pt stats little more tired than usual.   Pt has hx of benign positional vertigo. Pt seen on Mar 22, 2019 by NP Melissa. Pt has rx of meclizine to use for dizziness."  Also below A and P on last visit.  "Patient has recent symptoms of chills, frequent urination and some fatigue.  The symptoms are not entirely new but she is complained of these symptoms in the past.  Broad differential diagnoses and I do think it would be best for her to be seen in the office later this week to get exact diagnosis.  However due to the pandemic situation I think it is best to go ahead and get her tested for Covid tomorrow morning at the drive-through test center so I can see her later in the week.  Asked her to notify me if she has any worsening signs/symptoms pending Covid test results.  If studies negative and her signs/symptoms do not worsen none we will plan to see her in the office and likely get CBC, CMP, A1c, UA and urine culture.  Patient does have some chronic intermittent vertigo in the past and I did refill her meclizine..  Also refilled her meloxicam."  Pt covid test came back negative.  Pt also notes about 10-14 days ago she rolled out of bed by accident sleeping. She somehow hit her left mandible area on fall. Now has mild soreness. Pt had ct 3 days ago and no fx of mandible.    Review of Systems  Constitutional: Positive for chills and fatigue.  Cardiovascular: Negative for chest pain and palpitations.   Gastrointestinal: Negative for abdominal pain.  Endocrine: Negative for polydipsia and polyphagia.  Genitourinary: Positive for frequency.  Musculoskeletal: Negative for back pain.  Neurological: Negative for dizziness, speech difficulty, weakness and headaches.       None presently but intermittnet episodes vertigo.  Hematological: Negative for adenopathy. Does not bruise/bleed easily.  Psychiatric/Behavioral: Negative for behavioral problems.   Past Medical History:  Diagnosis Date  . Arthritis   . Benign essential HTN 02/18/2015  . Cellulitis 02/18/2015  . Chicken pox   . Chronic fatigue   . Dementia ([place])   . Depression    since last 04/2018 daughter passed.    . Diabetes mellitus without complication ([place])   . Diabetic peripheral neuropathy ([place])   . GERD (gastroesophageal reflux disease)   . Hypercholesteremia   . Hypertension   . Liver disease   . Polyp of colon      Social History   Socioeconomic History  . Marital status: Married    Spouse name: Not on file  . Number of children: 5  . Years of education: 19  . Highest education level: Not on file  Occupational History  . Occupation: Retired  Scientific laboratory technician  . Financial resource strain: Not on file  . Food insecurity    Worry: Not on file    Inability: Not on file  . Transportation needs    Medical: Not  on file    Non-medical: Not on file  Tobacco Use  . Smoking status: Never Smoker  . Smokeless tobacco: Never Used  Substance and Sexual Activity  . Alcohol use: No  . Drug use: No  . Sexual activity: Not on file  Lifestyle  . Physical activity    Days per week: Not on file    Minutes per session: Not on file  . Stress: Not on file  Relationships  . Social Herbalist on phone: Not on file    Gets together: Not on file    Attends religious service: Not on file    Active member of club or organization: Not on file    Attends meetings of clubs or organizations: Not on file    Relationship  status: Not on file  . Intimate partner violence    Fear of current or ex partner: Not on file    Emotionally abused: Not on file    Physically abused: Not on file    Forced sexual activity: Not on file  Other Topics Concern  . Not on file  Social History Narrative   Fun: paint, word search   Feels safe where she is living and no concern for abuse   Denies religious beliefs effecting healthcare.     Past Surgical History:  Procedure Laterality Date  . APPENDECTOMY    . HERNIA REPAIR    . KNEE SURGERY    . ROTATOR CUFF REPAIR    . TONSILLECTOMY      Family History  Problem Relation Age of Onset  . Healthy Mother   . Healthy Father   . Healthy Paternal Grandmother   . Healthy Paternal Grandfather     Allergies  Allergen Reactions  . Codeine Other (See Comments)    Woke up the next morning and did not know where she was  . Other     Doesn't like to take pain medication due to hallucinations.  . Shellfish Allergy     Current Outpatient Medications on File Prior to Visit  Medication Sig Dispense Refill  . acetaZOLAMIDE (DIAMOX) 250 MG tablet TAKE 1 TABLET BY MOUTH TWICE A DAY 30 tablet 1  . amLODipine (NORVASC) 10 MG tablet Take 1 tablet (10 mg total) by mouth daily. 90 tablet 1  . aspirin EC 81 MG tablet Take 81 mg by mouth daily.    [name] atorvastatin (LIPITOR) 20 MG tablet Take 1 tablet (20 mg total) by mouth daily. 90 tablet 3  . Blood Glucose Monitoring Suppl (ONE TOUCH ULTRA 2) w/Device KIT Use to check blood sugars twice a day Dx E11.9 1 each 0  . Cholecalciferol (VITAMIN D-3) 1000 UNITS CAPS Take 1,000 Units by mouth.    . cloNIDine (CATAPRES) 0.1 MG tablet Take 1 tablet (0.1 mg total) by mouth 2 (two) times daily. 60 tablet 5  . cyanocobalamin 1000 MCG tablet Take 1,000 mcg by mouth daily.    [name] esomeprazole (NEXIUM) 40 MG capsule Take 1 capsule (40 mg total) by mouth daily at 12 noon. 90 capsule 1  . famotidine (PEPCID) 20 MG tablet TAKE 1 TABLET BY MOUTH TWICE A  DAY 30 tablet 0  . gabapentin (NEURONTIN) 300 MG capsule Take 1 capsule (300 mg total) by mouth 3 (three) times daily. TAKE 1 CAPSULE BY MOUTH THREE TIMES A DAY 270 capsule 0  . glucose blood (ONE TOUCH ULTRA TEST) test strip USE TO CHECK BLOOD SUGARS TWICE A DAY DX E11.9 100  each 1  . Lancets (ONETOUCH ULTRASOFT) lancets USE TO HELP CHECK BLOOD SUGARS TWICE A DAY E11.9 100 each 1  . meclizine (ANTIVERT) 25 MG tablet TAKE 1/2 TO 1 TABLET BY MOUTH TWICE A DAY AS NEEDED FOR DIZZINESS 30 tablet 0  . meloxicam (MOBIC) 7.5 MG tablet TAKE 1 TABLET BY MOUTH EVERY DAY 30 tablet 0  . meloxicam (MOBIC) 7.5 MG tablet Take 1 tablet (7.5 mg total) by mouth daily. 30 tablet 0  . metoprolol succinate (TOPROL-XL) 25 MG 24 hr tablet Take 1 tablet (25 mg total) by mouth daily. 30 tablet 5  . Multiple Vitamin (MULTIVITAMIN) tablet Take 1 tablet by mouth daily.    . predniSONE (DELTASONE) 5 MG tablet Take 6 pills for first day, 5 pills second day, 4 pills third day, 3 pills fourth day, 2 pills the fifth day, and 1 pill sixth day. 21 tablet 0  . sitaGLIPtin (JANUVIA) 100 MG tablet Take 1 tablet (100 mg total) by mouth daily. 90 tablet 1  . terconazole (TERAZOL 3) 0.8 % vaginal cream Place 1 applicator vaginally at bedtime. 20 g 0   No current facility-administered medications on file prior to visit.     BP 129/71   Pulse (!) 58   Temp (!) 96.9 F (36.1 C) (Temporal)   Resp 16   Ht '5\' 3"'  (1.6 m)   Wt 140 lb 3.2 oz (63.6 kg)   SpO2 100%   BMI 24.84 kg/m       Objective:   Physical Exam  General Mental Status- Alert. General Appearance- Not in acute distress.   Skin General: Color- Normal Color. Moisture- Normal Moisture.  Neck Carotid Arteries- Normal color. Moisture- Normal Moisture. No carotid bruits. No JVD.  heent- negative. But slight angle of mandible pain left side on exam.   Chest and Lung Exam Auscultation: Breath Sounds:-Normal.  Cardiovascular Auscultation:Rythm- Regular.  Murmurs & Other Heart Sounds:Auscultation of the heart reveals- No Murmurs.  Neurologic Cranial Nerve exam:- CN III-XII intact(No nystagmus), symmetric smile. Strength:- 5/5 equal and symmetric strength both upper and lower extremities.  Abdomen- soft,nt, nd, +bs. No rebound or guarding. No suprapubic pressure.  Back- no cva tenderness.  heent- negative except faint pain over proximal mandible left side.     Assessment & Plan:  For frequent urination will get ua and urine culture. Not complaining today as before so will wait for culture result before potentially treating.  For fatigue will get cbc and cmp.  For diabetes hx will get a1c. Continue current diabetic meds pending review of results.  Pt likely has contusion pain left mandible as her ct facial bones was negative.  Follow up date to be determined after lab review.  25 minutes spent with pt. 50% of time spent counseling pt on plan going forward  [employer]

## 2019-04-01 NOTE — Addendum Note (Signed)
Addended by: Hinton Dyer on: 04/01/2019 02:41 PM   Modules accepted: Orders

## 2019-04-01 NOTE — Telephone Encounter (Signed)
Pt husband states he will check with her when he picked her up

## 2019-04-01 NOTE — Addendum Note (Signed)
Addended by: Caffie Pinto on: 04/01/2019 02:45 PM   Modules accepted: Orders

## 2019-04-02 LAB — HEMOGLOBIN A1C
Hgb A1c MFr Bld: 5.3 % of total Hgb (ref <5.7)
Mean Plasma Glucose: 105 (calc)
eAG (mmol/L): 5.8 (calc)

## 2019-04-02 LAB — COMPREHENSIVE METABOLIC PANEL
AG Ratio: 1.3 (calc) (ref 1.0–2.5)
ALT: 8 U/L (ref 6–29)
AST: 10 U/L (ref 10–35)
Albumin: 4.3 g/dL (ref 3.6–5.1)
Alkaline phosphatase (APISO): 62 U/L (ref 37–153)
BUN/Creatinine Ratio: 14 (calc) (ref 6–22)
BUN: 14 mg/dL (ref 7–25)
CO2: 20 mmol/L (ref 20–32)
Calcium: 10.1 mg/dL (ref 8.6–10.4)
Chloride: 110 mmol/L (ref 98–110)
Creat: 1 mg/dL — ABNORMAL HIGH (ref 0.60–0.93)
Globulin: 3.4 g/dL (calc) (ref 1.9–3.7)
Glucose, Bld: 172 mg/dL — ABNORMAL HIGH (ref 65–99)
Potassium: 3.7 mmol/L (ref 3.5–5.3)
Sodium: 139 mmol/L (ref 135–146)
Total Bilirubin: 0.3 mg/dL (ref 0.2–1.2)
Total Protein: 7.7 g/dL (ref 6.1–8.1)

## 2019-04-02 LAB — IRON, TOTAL/TOTAL IRON BINDING CAP
%SAT: 20 % (calc) (ref 16–45)
Iron: 52 ug/dL (ref 45–160)
TIBC: 265 mcg/dL (calc) (ref 250–450)

## 2019-04-02 LAB — URINE CULTURE
MICRO NUMBER:: 998525
SPECIMEN QUALITY:: ADEQUATE

## 2019-04-02 LAB — FERRITIN: Ferritin: 108 ng/mL (ref 16–288)

## 2019-04-07 DIAGNOSIS — H34831 Tributary (branch) retinal vein occlusion, right eye, with macular edema: Secondary | ICD-10-CM | POA: Diagnosis not present

## 2019-04-11 ENCOUNTER — Ambulatory Visit: Payer: PPO | Admitting: Physical Therapy

## 2019-04-11 ENCOUNTER — Other Ambulatory Visit: Payer: Self-pay

## 2019-04-11 ENCOUNTER — Encounter: Payer: Self-pay | Admitting: Physical Therapy

## 2019-04-11 DIAGNOSIS — R262 Difficulty in walking, not elsewhere classified: Secondary | ICD-10-CM

## 2019-04-11 DIAGNOSIS — M25552 Pain in left hip: Secondary | ICD-10-CM

## 2019-04-11 DIAGNOSIS — M25652 Stiffness of left hip, not elsewhere classified: Secondary | ICD-10-CM

## 2019-04-11 DIAGNOSIS — M6281 Muscle weakness (generalized): Secondary | ICD-10-CM

## 2019-04-11 NOTE — Therapy (Addendum)
Meredith Phillips [address]  [place], [place], [address] Phone: [phone]   Fax:  [phone]  Physical Therapy Treatment  Patient Details  Name: Meredith Phillips MRN: [id number] Date of Birth: 1946/06/03 Referring Provider (PT): Clearance Coots, MD   Encounter Date: 04/11/2019  PT End of Session - 04/11/19 0277    Visit Number  5    Number of Visits  11    Date for PT Re-Evaluation  05/23/19    Authorization Type  [employer]    PT Start Time  1445    PT Stop Time  1529    PT Time Calculation (min)  44 min    Activity Tolerance  Patient tolerated treatment well    Behavior During Therapy  [facility] for tasks assessed/performed       Past Medical History:  Diagnosis Date  . Arthritis   . Benign essential HTN 02/18/2015  . Cellulitis 02/18/2015  . Chicken pox   . Chronic fatigue   . Dementia ([place])   . Depression    since last 04/2018 daughter passed.    . Diabetes mellitus without complication ([place])   . Diabetic peripheral neuropathy ([place])   . GERD (gastroesophageal reflux disease)   . Hypercholesteremia   . Hypertension   . Liver disease   . Polyp of colon     Past Surgical History:  Procedure Laterality Date  . APPENDECTOMY    . HERNIA REPAIR    . KNEE SURGERY    . ROTATOR CUFF REPAIR    . TONSILLECTOMY      There were no vitals filed for this visit.  Subjective Assessment - 04/11/19 1448    Subjective  The L hip pain comes and goes. Pain is located over L superior buttock and radiates to lateral hip. Gets worse when weather changes, prolonged standing and sitting, sleeping on L side. A bit better with ice. Had a death in the family recently so she has not been doing her exercises as consistently. Reporting R shoulder pain that radiated across chest to L side of chest started Saturday. Currently only having R shoulder pain.    Pertinent History  Liver disease, HTN, HLD, GERD, DM, peripheral neuropathy,  depression, dementia, chronic fatigue, RTC repair, knee surgery    Diagnostic tests  01/13/19 L hip xray: No evidence for acute abnormality.    Patient Stated Goals  "i wish my legs were stronger"    Currently in Pain?  Yes    Pain Score  4     Pain Location  Hip    Pain Orientation  Left    Pain Descriptors / Indicators  Sharp    Pain Type  Chronic pain    Multiple Pain Sites  Yes    Pain Score  6    Pain Location  Shoulder    Pain Orientation  Right    Pain Descriptors / Indicators  Sharp    Pain Type  Acute pain         [facility] PT Assessment - 04/11/19 0001      Assessment   Medical Diagnosis  Greater trochanteric pain syndrome of L LE    Referring Provider (PT)  Clearance Coots, MD    Onset Date/Surgical Date  --   several months     AROM   Right Hip External Rotation   37    Right Hip Internal Rotation   27    Left Hip External  Rotation   30    Left Hip Internal Rotation   17      Strength   Right Hip Flexion  4+/5    Right Hip ABduction  4+/5   nonpainful pop in R knee   Right Hip ADduction  4+/5    Left Hip Flexion  4+/5    Left Hip ABduction  4/5    Left Hip ADduction  4/5    Right Knee Flexion  4+/5   pain in knee   Right Knee Extension  4+/5   pain in knee   Left Knee Flexion  4/5    Left Knee Extension  4/5    Right Ankle Dorsiflexion  3+/5    Right Ankle Plantar Flexion  3+/5   pain in R ankle   Left Ankle Dorsiflexion  4/5    Left Ankle Plantar Flexion  4/5                   [facility] Adult PT Treatment/Exercise - 04/11/19 0001      Knee/Hip Exercises: Stretches   Piriformis Stretch  Right;Left;1 rep;30 seconds;2 reps    Piriformis Stretch Limitations  KTOS; supine and sitting    Other Knee/Hip Stretches  R & L figure 4 stretch 30" each      Knee/Hip Exercises: Supine   Bridges  Both;10 reps    Bridges Limitations  Hooklying       Knee/Hip Exercises: Sidelying   Clams  L hip clam shell x 10              PT Education -  04/11/19 1747    Education Details  update to HEP; prognosis, POC    Person(s) Educated  Patient    Methods  Explanation;Demonstration;Tactile cues;Verbal cues;Handout    Comprehension  Verbalized understanding;Returned demonstration       PT Short Term Goals - 04/11/19 1455      PT SHORT TERM GOAL #1   Title  patient to be independent with initial HEP    Time  3    Period  Weeks    Status  On-going   limited compliance d/t death in family   Target Date  05/02/19        PT Long Term Goals - 04/11/19 1455      PT LONG TERM GOAL #1   Title  patient to be independwnt with advanced HEP    Time  6    Period  Weeks    Status  On-going   limited compliance d/t death in family   Target Date  05/23/19      PT LONG TERM GOAL #2   Title  Patient to demonstrate L hip AROM symmetrical to opposite LE and nonpainful.    Time  6    Period  Weeks    Status  On-going   showing improvement in B hip ER   Target Date  05/23/19      PT LONG TERM GOAL #3   Title  Patient to demonstrate B LE strength >/=4+/5.    Period  Weeks    Status  On-going   Strength testing revealed improvement in B hip flexion and adduction, L hip abduction, and R knee flexion strength   Target Date  05/23/19      PT LONG TERM GOAL #4   Title  Patient to report 75% improvement in catching pain with STS transitions.    Time  6    Period  Weeks  Status  On-going   has stayed about the same   Target Date  05/23/19      PT LONG TERM GOAL #5   Title  Patient to report 50% improved confidence when walking without AD d/t improvement in pain levels.    Time  6    Period  Weeks    Status  Partially Met   able to walk without AD in the house, feeling 20% better   Target Date  05/23/19            Plan - 04/11/19 1748    Clinical Impression Statement  Patient returning to PT after hiatus d/t medical issues and death in the family. Reporting continued L hip pain over superior buttock with radiation down to  lateral hip. Worse with weather changes, prolonged sitting and standing, and sleeping on L side. Since initial eval, patient has demonstrated improved B hip ER AROM, now with IR most limiting. Strength testing revealed improvement in B hip flexion and adduction, L hip abduction, and R knee flexion strength. Patient notes that the catching pain in L hip with STS has stayed about the same. However, feels that she feels 20% more confident walking without AD inside the house. However, lacks confidence maneuvering stairs at home, so she avoids them. Reviewed HEP with patient with patient reporting and demonstrating good understanding. Patient reporting improvement in hip pain at end of session. Patient has demonstrating improvement towards goals. Would continue to benefit form skilled PT services 1x/week for 6 weeks to address remaining goals.    Personal Factors and Comorbidities  Age;Comorbidity 3+;Time since onset of injury/illness/exacerbation;Fitness;Past/Current Experience    Comorbidities  Liver disease, HTN, HLD, GERD, DM, peripheral neuropathy, depression, dementia, chronic fatigue, RTC repair, knee surgery    Rehab Potential  Good    PT Frequency  1x / week    PT Duration  6 weeks    PT Treatment/Interventions  ADLs/Self Care Home Management;Cryotherapy;Electrical Stimulation;Iontophoresis 38m/ml Dexamethasone;Moist Heat;Balance training;Therapeutic exercise;Therapeutic activities;Functional mobility training;Stair training;Gait training;Ultrasound;Neuromuscular re-education;Patient/family education;Manual techniques;Taping;Energy conservation;Dry needling;Passive range of motion    PT Next Visit Plan  Monitor HEP adherence and understanding; LE flexibility work; LE strengthening as pt. tolerates; work on step ups to improve confidence with porch stairs    Consulted and Agree with Plan of Care  Patient       Patient will benefit from skilled therapeutic intervention in order to improve the following  deficits and impairments:  Hypomobility, Decreased activity tolerance, Decreased strength, Pain, Difficulty walking, Decreased balance, Decreased range of motion, Improper body mechanics, Postural dysfunction, Impaired flexibility  Visit Diagnosis: Pain in left hip  Stiffness of left hip, not elsewhere classified  Muscle weakness (generalized)  Difficulty in walking, not elsewhere classified     Problem List Patient Active Problem List   Diagnosis Date Noted  . Head trauma 03/29/2019  . Greater trochanteric pain syndrome of left lower extremity 01/18/2019  . Hyperlipidemia 03/26/2018  . Hyponatremia 03/26/2018  . Atypical chest pain 03/25/2018  . Urge incontinence 02/25/2018  . Glucosuria 02/25/2018  . Anxiety and depression 12/16/2017  . Memory loss 11/02/2017  . B12 deficiency 11/02/2017  . Vertigo 10/10/2017  . Trochanteric bursitis of left hip 10/10/2017  . Murmur, cardiac 04/15/2017  . Asymptomatic postmenopausal estrogen deficiency 04/15/2017  . Right-sided low back pain without sciatica 02/10/2017  . Grief at loss of child 07/29/2016  . Allergic rhinitis 02/27/2016  . GERD (gastroesophageal reflux disease) 08/06/2015  . Baker's cyst, ruptured 08/03/2015  .  Type 2 diabetes mellitus ([place]) 06/29/2015  . It band syndrome, left 06/15/2015  . Benign essential HTN 02/18/2015  . Cellulitis 02/18/2015  . Sciatica 01/17/2015  . Routine general medical examination at a health care facility 12/26/2014  . Medicare annual wellness visit, subsequent 12/26/2014  . Generalized osteoarthritis of multiple sites 11/25/2014  . GERD with esophagitis 11/25/2014  . Hip arthrosis 11/25/2014  . Diarrhea in adult patient 11/29/2013     Janene Harvey, PT, DPT 04/11/19 5:56 PM   [facility] [address]  [place], [place], [address] Phone: [phone]   Fax:  [phone]  Name: Meredith Phillips MRN:  [id number] Date of Birth: Oct 26, 1945  PHYSICAL THERAPY DISCHARGE SUMMARY  Visits from Start of Care: 5  Current functional level related to goals / functional outcomes: Unable to assess; patient did not return d/t health problems   Remaining deficits: See above   Education / Equipment: HEP  Plan: Patient agrees to discharge.  Patient goals were not met. Patient is being discharged due to a change in medical status.  ?????     Janene Harvey, PT, DPT 05/25/19 1:49 PM

## 2019-04-12 ENCOUNTER — Other Ambulatory Visit: Payer: Self-pay | Admitting: *Deleted

## 2019-04-12 MED ORDER — AMLODIPINE BESYLATE 10 MG PO TABS: 10.0000 mg | ORAL_TABLET | Freq: Every day | ORAL | 1 refills | Status: DC

## 2019-04-13 ENCOUNTER — Ambulatory Visit: Payer: PPO | Admitting: Physical Therapy

## 2019-04-16 ENCOUNTER — Emergency Department (HOSPITAL_BASED_OUTPATIENT_CLINIC_OR_DEPARTMENT_OTHER): Payer: PPO

## 2019-04-16 ENCOUNTER — Emergency Department (HOSPITAL_BASED_OUTPATIENT_CLINIC_OR_DEPARTMENT_OTHER)
Admission: EM | Admit: 2019-04-16 | Discharge: 2019-04-16 | Disposition: A | Discharge status: Home / Self Care | Payer: PPO | Attending: Emergency Medicine | Admitting: Emergency Medicine

## 2019-04-16 ENCOUNTER — Encounter (HOSPITAL_BASED_OUTPATIENT_CLINIC_OR_DEPARTMENT_OTHER): Payer: Self-pay | Admitting: Emergency Medicine

## 2019-04-16 ENCOUNTER — Other Ambulatory Visit: Payer: Self-pay

## 2019-04-16 DIAGNOSIS — I10 Essential (primary) hypertension: Secondary | ICD-10-CM | POA: Insufficient documentation

## 2019-04-16 DIAGNOSIS — Z7982 Long term (current) use of aspirin: Secondary | ICD-10-CM | POA: Diagnosis not present

## 2019-04-16 DIAGNOSIS — Z7984 Long term (current) use of oral hypoglycemic drugs: Secondary | ICD-10-CM | POA: Insufficient documentation

## 2019-04-16 DIAGNOSIS — Z79899 Other long term (current) drug therapy: Secondary | ICD-10-CM | POA: Diagnosis not present

## 2019-04-16 DIAGNOSIS — M25552 Pain in left hip: Secondary | ICD-10-CM | POA: Diagnosis not present

## 2019-04-16 DIAGNOSIS — F0390 Unspecified dementia without behavioral disturbance: Secondary | ICD-10-CM | POA: Insufficient documentation

## 2019-04-16 DIAGNOSIS — E119 Type 2 diabetes mellitus without complications: Secondary | ICD-10-CM | POA: Insufficient documentation

## 2019-04-16 DIAGNOSIS — M25559 Pain in unspecified hip: Secondary | ICD-10-CM

## 2019-04-16 LAB — URINALYSIS, ROUTINE W REFLEX MICROSCOPIC
Bilirubin Urine: NEGATIVE
Glucose, UA: NEGATIVE mg/dL
Hgb urine dipstick: NEGATIVE
Ketones, ur: NEGATIVE mg/dL
Nitrite: NEGATIVE
Protein, ur: 100 mg/dL — AB
Specific Gravity, Urine: 1.02 (ref 1.005–1.030)
pH: 6 (ref 5.0–8.0)

## 2019-04-16 LAB — URINALYSIS, MICROSCOPIC (REFLEX): RBC / HPF: NONE SEEN RBC/hpf (ref 0–5)

## 2019-04-16 LAB — CBG MONITORING, ED: Glucose-Capillary: 119 mg/dL — ABNORMAL HIGH (ref 70–99)

## 2019-04-16 MED ORDER — METHOCARBAMOL 500 MG PO TABS
500.0000 mg | ORAL_TABLET | Freq: Once | ORAL | Status: AC
  Administered 2019-04-16: 17:00:00 500 mg via ORAL
  Filled 2019-04-16: qty 1

## 2019-04-16 MED ORDER — CEPHALEXIN 500 MG PO CAPS: 500.0000 mg | ORAL_CAPSULE | Freq: Three times a day (TID) | ORAL | 0 refills | Status: AC

## 2019-04-16 MED ORDER — METHOCARBAMOL 500 MG PO TABS: 500.0000 mg | ORAL_TABLET | Freq: Two times a day (BID) | ORAL | 0 refills | Status: DC

## 2019-04-16 NOTE — ED Triage Notes (Addendum)
Reports left hip pain for two days.  Denies any injury.  Difficult to stand today.  Upon further discussion found that patient has not taken any of her meds for two days including mobic and bp meds.

## 2019-04-16 NOTE — Discharge Instructions (Addendum)
Please schedule follow up with Dr. Raeford Razor for recheck next week.  Recommend taking the newly prescribed medication as needed for pain and spasms.  Return to ER if you have significant worsening of pain, unable to walk, fever, swelling, falls or other new concerning symptom.

## 2019-04-16 NOTE — ED Notes (Signed)
Pt amb to [facility] w/ asst 

## 2019-04-16 NOTE — ED Notes (Signed)
Pt in radiology 

## 2019-04-16 NOTE — ED Provider Notes (Signed)
[facility] Provider Note   CSN: [id number] Arrival date & time: 04/16/19  1502     History   Chief Complaint Chief Complaint  Patient presents with  . Hip Pain    HPI Meredith Phillips is a 73 y.o. female presenting for evaluation of left hip pain.  Patient states she developed worsening left hip pain 2 days ago.  It became gradually, no precipitating event.  Is making it hard to walk due to pain.  Patient states pain is intermittent, comes as a spasm and then resolves.  Pain does not radiate into her leg.  She denies fall, trauma, or injury.  She initially states she has been taking her medication including her Mobic, but then states she ran out and has not gotten a refill yet.  She denies numbness or tingling.  Additionally, patient states for the past several months she has been having urinary symptoms.  She is unable to specify exactly what they are, but eventually states it is urgency and frequency.  No dysuria or hematuria.  Additional history obtained from chart review.  Per chart review, patient has chronic left hip pain.  History of left IT band syndrome, sees Dr Raeford Razor with ortho for L hip pain.  Additional history of hypertension, dementia, depression, diabetes, GERD, hyperlipidemia.     HPI  Past Medical History:  Diagnosis Date  . Arthritis   . Benign essential HTN 02/18/2015  . Cellulitis 02/18/2015  . Chicken pox   . Chronic fatigue   . Dementia ([place])   . Depression    since last 04/2018 daughter passed.    . Diabetes mellitus without complication ([place])   . Diabetic peripheral neuropathy ([place])   . GERD (gastroesophageal reflux disease)   . Hypercholesteremia   . Hypertension   . Liver disease   . Polyp of colon     Patient Active Problem List   Diagnosis Date Noted  . Head trauma 03/29/2019  . Greater trochanteric pain syndrome of left lower extremity 01/18/2019  . Hyperlipidemia 03/26/2018  . Hyponatremia 03/26/2018  .  Atypical chest pain 03/25/2018  . Urge incontinence 02/25/2018  . Glucosuria 02/25/2018  . Anxiety and depression 12/16/2017  . Memory loss 11/02/2017  . B12 deficiency 11/02/2017  . Vertigo 10/10/2017  . Trochanteric bursitis of left hip 10/10/2017  . Murmur, cardiac 04/15/2017  . Asymptomatic postmenopausal estrogen deficiency 04/15/2017  . Right-sided low back pain without sciatica 02/10/2017  . Grief at loss of child 07/29/2016  . Allergic rhinitis 02/27/2016  . GERD (gastroesophageal reflux disease) 08/06/2015  . Baker's cyst, ruptured 08/03/2015  . Type 2 diabetes mellitus ([place]) 06/29/2015  . It band syndrome, left 06/15/2015  . Benign essential HTN 02/18/2015  . Cellulitis 02/18/2015  . Sciatica 01/17/2015  . Routine general medical examination at a health care facility 12/26/2014  . Medicare annual wellness visit, subsequent 12/26/2014  . Generalized osteoarthritis of multiple sites 11/25/2014  . GERD with esophagitis 11/25/2014  . Hip arthrosis 11/25/2014  . Diarrhea in adult patient 11/29/2013    Past Surgical History:  Procedure Laterality Date  . APPENDECTOMY    . HERNIA REPAIR    . KNEE SURGERY    . ROTATOR CUFF REPAIR    . TONSILLECTOMY       OB History   No obstetric history on file.      Home Medications    Prior to Admission medications   Medication Sig Start Date End Date Taking? Authorizing Provider  acetaZOLAMIDE (DIAMOX) 250 MG tablet TAKE 1 TABLET BY MOUTH TWICE A DAY 02/22/19  Yes Saguier, Percell Miller, PA-C  amLODipine (NORVASC) 10 MG tablet Take 1 tablet (10 mg total) by mouth daily. 04/12/19  Yes Saguier, Iris Pert  aspirin EC 81 MG tablet Take 81 mg by mouth daily.   Yes [provider]  atorvastatin (LIPITOR) 20 MG tablet Take 1 tablet (20 mg total) by mouth daily. 09/28/18  Yes Saguier, Percell Miller, PA-C  Cholecalciferol (VITAMIN D-3) 1000 UNITS CAPS Take 1,000 Units by mouth.   Yes [provider]  cloNIDine (CATAPRES) 0.1 MG  tablet Take 1 tablet (0.1 mg total) by mouth 2 (two) times daily. 10/11/18  Yes Saguier, Percell Miller, PA-C  cyanocobalamin 1000 MCG tablet Take 1,000 mcg by mouth daily.   Yes [provider]  esomeprazole (NEXIUM) 40 MG capsule TAKE 1 CAPSULE (40 MG TOTAL) BY MOUTH DAILY AT 12 NOON. 04/01/19  Yes Saguier, Percell Miller, PA-C  famotidine (PEPCID) 20 MG tablet TAKE 1 TABLET BY MOUTH TWICE A DAY 12/08/18  Yes Saguier, Percell Miller, PA-C  gabapentin (NEURONTIN) 300 MG capsule Take 1 capsule (300 mg total) by mouth 3 (three) times daily. TAKE 1 CAPSULE BY MOUTH THREE TIMES A DAY 05/17/18  Yes Lance Sell, NP  meloxicam (MOBIC) 7.5 MG tablet TAKE 1 TABLET BY MOUTH EVERY DAY 01/19/19  Yes Saguier, Percell Miller, PA-C  meloxicam (MOBIC) 7.5 MG tablet Take 1 tablet (7.5 mg total) by mouth daily. 03/28/19  Yes Saguier, Percell Miller, PA-C  metoprolol succinate (TOPROL-XL) 25 MG 24 hr tablet Take 1 tablet (25 mg total) by mouth daily. 03/04/19  Yes Saguier, Percell Miller, PA-C  Multiple Vitamin (MULTIVITAMIN) tablet Take 1 tablet by mouth daily.   Yes [provider]  blood glucose meter kit and supplies Dispense based on patient and insurance preference. Use up to four times daily as directed. (FOR ICD-10 E10.9, E11.9). 04/01/19   Saguier, Percell Miller, PA-C  Blood Glucose Monitoring Suppl (ONE TOUCH ULTRA 2) w/Device KIT Use to check blood sugars twice a day Dx E11.9 09/01/17   Marrian Salvage, FNP  glucose blood (ONE TOUCH ULTRA TEST) test strip USE TO CHECK BLOOD SUGARS TWICE A DAY DX E11.9 11/30/17   Lance Sell, NP  Lancets (ONETOUCH ULTRASOFT) lancets USE TO HELP CHECK BLOOD SUGARS TWICE A DAY E11.9 11/30/17   Lance Sell, NP  meclizine (ANTIVERT) 25 MG tablet TAKE 1/2 TO 1 TABLET BY MOUTH TWICE A DAY AS NEEDED FOR DIZZINESS 03/28/19   Saguier, Percell Miller, PA-C  predniSONE (DELTASONE) 5 MG tablet Take 6 pills for first day, 5 pills second day, 4 pills third day, 3 pills fourth day, 2 pills the fifth day, and 1  pill sixth day. 03/29/19   Rosemarie Ax, MD  sitaGLIPtin (JANUVIA) 100 MG tablet Take 1 tablet (100 mg total) by mouth daily. 11/09/18   Saguier, Percell Miller, PA-C  terconazole (TERAZOL 3) 0.8 % vaginal cream Place 1 applicator vaginally at bedtime. 09/22/18   Saguier, Percell Miller, PA-C    Family History Family History  Problem Relation Age of Onset  . Healthy Mother   . Healthy Father   . Healthy Paternal Grandmother   . Healthy Paternal Grandfather     Social History Social History   Tobacco Use  . Smoking status: Never Smoker  . Smokeless tobacco: Never Used  Substance Use Topics  . Alcohol use: No  . Drug use: No     Allergies   Codeine, Januvia [sitagliptin], Other, and Shellfish  allergy   Review of Systems Review of Systems  Genitourinary: Positive for frequency and urgency.  Musculoskeletal: Positive for arthralgias.     Physical Exam Updated Vital Signs BP (!) 164/95 (BP Location: Right Arm)   Pulse 85   Temp 99.1 F (37.3 C) (Oral)   Resp 18   Ht _0  (1.6 m)   Wt 64 kg   SpO2 100%   BMI 24.98 kg/m   Physical Exam Vitals signs and nursing note reviewed.  Constitutional:      General: She is not in acute distress.    Appearance: She is well-developed.     Comments: Elderly female who appears nontoxic  HENT:     Head: Normocephalic and atraumatic.  Eyes:     Extraocular Movements: Extraocular movements intact.     Conjunctiva/sclera: Conjunctivae normal.     Pupils: Pupils are equal, round, and reactive to light.  Neck:     Musculoskeletal: Normal range of motion and neck supple.  Cardiovascular:     Rate and Rhythm: Normal rate and regular rhythm.     Pulses: Normal pulses.  Pulmonary:     Effort: Pulmonary effort is normal. No respiratory distress.     Breath sounds: Normal breath sounds. No wheezing.  Abdominal:     General: There is no distension.     Palpations: Abdomen is soft. There is no mass.     Tenderness: There is no abdominal  tenderness. There is no guarding or rebound.     Comments: No tenderness palpation of the abdomen.  Musculoskeletal:        General: Tenderness present.     Comments: Tenderness palpation of the left lateral hip, left buttock, and left side low back.  No increased pain over midline spine, no step-offs or deformities.  Pedal pulses intact.  Sensation intact.  No saddle paresthesia. patellar reflexes equal bilaterally.   Skin:    General: Skin is warm and dry.     Capillary Refill: Capillary refill takes less than 2 seconds.  Neurological:     Mental Status: She is alert and oriented to person, place, and time.      ED Treatments / Results  Labs (all labs ordered are listed, but only abnormal results are displayed) Labs Reviewed  CBG MONITORING, ED - Abnormal; Notable for the following components:      Result Value   Glucose-Capillary 119 (*)    All other components within normal limits  URINALYSIS, ROUTINE W REFLEX MICROSCOPIC    EKG None  Radiology Dg Hip Unilat W Or Wo Pelvis 2-3 Views Left  Result Date: 04/16/2019 CLINICAL DATA:  Left hip pain for 2 days with no known injury. EXAM: DG HIP (WITH OR WITHOUT PELVIS) 2-3V LEFT COMPARISON:  01/13/2019 FINDINGS: No fracture or bone lesion. Hip joints, SI joints and symphysis pubis are normally spaced and aligned. No significant arthropathic changes. There are degenerative changes of the visualized lumbar spine with a levoscoliosis. Soft tissues are unremarkable. IMPRESSION: 1. No fracture, bone lesion or hip joint abnormality. Electronically Signed   By: Lajean Manes M.D.   On: 04/16/2019 16:41    Procedures Procedures (including critical care time)  Medications Ordered in ED Medications  methocarbamol (ROBAXIN) tablet 500 mg (500 mg Oral Given 04/16/19 1655)     Initial Impression / Assessment and Plan / ED Course  I have reviewed the triage vital signs and the nursing notes.  Pertinent labs & imaging results that were  available during  my care of the patient were reviewed by me and considered in my medical decision making (see chart for details).        Patient presenting for evaluation of left hip pain.  Upon chart review and further discussion, this appears to be chronic, although worse today.  On exam, patient appears to have intermittent spasming-like pain with periods of no pain and periods of intense pain.  No obvious neurologic deficits.  Patient reports urinary frequency and urgency, but no loss of bowel bladder control.  No saddle paresthesias.  Patient reports difficulty walking due to pain, not weakness.  Will give Robaxin for pain control and x-ray due to patient's age.  Urine obtained for urgency and frequency.  X-ray viewed and interpreted by me, no fracture dislocation.  No significant improvement with Robaxin.  Case discussed with attending, Dr. Roslynn Amble evaluated the patient.  Will attempt to ambulate patient, if unsuccessful may consider need for norco. UA pending.   Pt signed out to Willette Cluster, MD for f/u on ambulation, pain and UA.   Final Clinical Impressions(s) / ED Diagnoses   Final diagnoses:  None    ED Discharge Orders    None       Franchot Heidelberg, PA-C 04/16/19 1759    Lucrezia Starch, MD 04/16/19 2314

## 2019-04-26 ENCOUNTER — Encounter: Payer: Self-pay | Admitting: Family Medicine

## 2019-04-26 ENCOUNTER — Other Ambulatory Visit: Payer: Self-pay | Admitting: Family Medicine

## 2019-04-26 ENCOUNTER — Other Ambulatory Visit: Payer: Self-pay

## 2019-04-26 ENCOUNTER — Emergency Department (HOSPITAL_BASED_OUTPATIENT_CLINIC_OR_DEPARTMENT_OTHER): Admission: EM | Admit: 2019-04-26 | Discharge: 2019-04-26 | Discharge status: Absent without leave | Payer: PPO

## 2019-04-26 ENCOUNTER — Ambulatory Visit (INDEPENDENT_AMBULATORY_CARE_PROVIDER_SITE_OTHER): Payer: PPO | Admitting: Family Medicine

## 2019-04-26 VITALS — BP 116/77 | HR 83 | Ht 63.0 in

## 2019-04-26 DIAGNOSIS — R5383 Other fatigue: Secondary | ICD-10-CM | POA: Diagnosis not present

## 2019-04-26 DIAGNOSIS — R5381 Other malaise: Secondary | ICD-10-CM | POA: Diagnosis not present

## 2019-04-26 DIAGNOSIS — M25552 Pain in left hip: Secondary | ICD-10-CM | POA: Diagnosis not present

## 2019-04-26 NOTE — Progress Notes (Signed)
Meredith Phillips - 73 y.o. female MRN [id number]  Date of birth: 1946-04-18  SUBJECTIVE:  Including CC & ROS.  Chief Complaint  Patient presents with  . Follow-up    follow up for left hip    Meredith Phillips is a 73 y.o. female that is following up for her left hip pain.  She has tried physical therapy.  She received a greater trochanteric injection with limited improvement.  She today she feels worse with nausea as well as diarrhea.  Unclear of how long her symptoms have been ongoing.  She denies fevers.  Denies any exposure anyone similar symptoms.  Symptoms are intermittent in nature.  Denies any shortness of breath or chest pain.  Has been drinking a lot of water.  No new or different medications.  She thinks her symptoms been ongoing for several days.  Tends to stay in bed most of the day.  Independent review of the CT abdomen pelvis from 2015 shows multiple lumbar spondylosis and facet disease.  Shows scoliosis and no significant degenerative changes of the hips.   Review of Systems  Constitutional: Negative for fever.  HENT: Negative for congestion.   Respiratory: Negative for cough.   Cardiovascular: Negative for chest pain.  Gastrointestinal: Positive for diarrhea and nausea.  Musculoskeletal: Positive for gait problem.  Skin: Negative for color change.  Neurological: Negative for syncope.  Hematological: Negative for adenopathy.    HISTORY: Past Medical, Surgical, Social, and Family History Reviewed & Updated per EMR.   Pertinent Historical Findings include:  Past Medical History:  Diagnosis Date  . Arthritis   . Benign essential HTN 02/18/2015  . Cellulitis 02/18/2015  . Chicken pox   . Chronic fatigue   . Dementia ([place])   . Depression    since last 04/2018 daughter passed.    . Diabetes mellitus without complication ([place])   . Diabetic peripheral neuropathy ([place])   . GERD (gastroesophageal reflux disease)   . Hypercholesteremia   . Hypertension   . Liver disease   .  Polyp of colon     Past Surgical History:  Procedure Laterality Date  . APPENDECTOMY    . HERNIA REPAIR    . KNEE SURGERY    . ROTATOR CUFF REPAIR    . TONSILLECTOMY      Allergies  Allergen Reactions  . Codeine Other (See Comments)    Woke up the next morning and did not know where she was  . Januvia [Sitagliptin] Itching  . Other     Doesn't like to take pain medication due to hallucinations.  . Shellfish Allergy     Family History  Problem Relation Age of Onset  . Healthy Mother   . Healthy Father   . Healthy Paternal Grandmother   . Healthy Paternal Grandfather      Social History   Socioeconomic History  . Marital status: Married    Spouse name: Not on file  . Number of children: 5  . Years of education: 43  . Highest education level: Not on file  Occupational History  . Occupation: Retired  Scientific laboratory technician  . Financial resource strain: Not on file  . Food insecurity    Worry: Not on file    Inability: Not on file  . Transportation needs    Medical: Not on file    Non-medical: Not on file  Tobacco Use  . Smoking status: Never Smoker  . Smokeless tobacco: Never Used  Substance and Sexual Activity  .  Alcohol use: No  . Drug use: No  . Sexual activity: Not on file  Lifestyle  . Physical activity    Days per week: Not on file    Minutes per session: Not on file  . Stress: Not on file  Relationships  . Social Musician on phone: Not on file    Gets together: Not on file    Attends religious service: Not on file    Active member of club or organization: Not on file    Attends meetings of clubs or organizations: Not on file    Relationship status: Not on file  . Intimate partner violence    Fear of current or ex partner: Not on file    Emotionally abused: Not on file    Physically abused: Not on file    Forced sexual activity: Not on file  Other Topics Concern  . Not on file  Social History Narrative   Fun: paint, word search   Feels  safe where she is living and no concern for abuse   Denies religious beliefs effecting healthcare.      PHYSICAL EXAM:  VS: BP 116/77   Pulse 83   Ht 5\' 3"  (1.6 m)   BMI 24.98 kg/m  Physical Exam Gen: NAD, alert, cooperative with exam,  ENT: normal lips, normal nasal mucosa,  Eye: normal EOM, normal conjunctiva and lids CV:  no edema, +2 pedal pulses, regular rate and rhythm, S1-S2 Resp: Clear to auscultation bilaterally, no accessory muscle use, non-labored,  GI: no masses or tenderness, no hernia, soft, nondistended, decreased bowel sounds Skin: no rashes, no areas of induration  Neuro: normal tone, normal sensation to touch Psych:  normal insight, alert and oriented MSK: Pain with ambulation,     ASSESSMENT & PLAN:   Malaise and fatigue Unclear as to the source.  These changes seem to be recent.  Having nausea and diarrhea and anorexia.  Also having polydipsia.  Was seen in the emergency department on 10/31 and treated for a suspected urinary tract infection.  Denies any dysuria today.  Denies any other areas of pain or discomfort.  Possibly she may be hyperglycemic and hyperosmolar. -CMP, CBC, urinalysis and urine culture. -Counseled on diet and nutrition. -Counseled on supportive care. -Given indications to seek immediate care.  Greater trochanteric pain syndrome of left lower extremity Has tried a greater trochanteric injection with limited improvement.  The pain seems to be inguinal which would suggest more of a hip joint issue.  She also has significant scoliosis as well as facet disease in the lumbar spine which may be contributing to some of her issues. -Counseled on supportive care. -Would consider hip joint injection and then evaluate for pain coming from the lumbar spine if no improvement with the hip joint injection.  We will hold off for now as she feels unwell today.

## 2019-04-26 NOTE — Patient Instructions (Signed)
Good to see you I will call with the lab results from today  Please be seen in the emergency department if your symptoms worsen  Please try to drink protein shakes or drink with some calories in them  Please send me a message in MyChart with any questions or updates.  Please see [place] back once you get to feeling better.   --Dr. Raeford Razor

## 2019-04-26 NOTE — Assessment & Plan Note (Signed)
Unclear as to the source.  These changes seem to be recent.  Having nausea and diarrhea and anorexia.  Also having polydipsia.  Was seen in the emergency department on 10/31 and treated for a suspected urinary tract infection.  Denies any dysuria today.  Denies any other areas of pain or discomfort.  Possibly she may be hyperglycemic and hyperosmolar. -CMP, CBC, urinalysis and urine culture. -Counseled on diet and nutrition. -Counseled on supportive care. -Given indications to seek immediate care.

## 2019-04-26 NOTE — Assessment & Plan Note (Signed)
Has tried a greater trochanteric injection with limited improvement.  The pain seems to be inguinal which would suggest more of a hip joint issue.  She also has significant scoliosis as well as facet disease in the lumbar spine which may be contributing to some of her issues. -Counseled on supportive care. -Would consider hip joint injection and then evaluate for pain coming from the lumbar spine if no improvement with the hip joint injection.  We will hold off for now as she feels unwell today.

## 2019-04-27 LAB — URINALYSIS
Bilirubin, UA: NEGATIVE
Glucose, UA: NEGATIVE
Leukocytes,UA: NEGATIVE
Nitrite, UA: NEGATIVE
Specific Gravity, UA: 1.027 (ref 1.005–1.030)
Urobilinogen, Ur: 1 mg/dL (ref 0.2–1.0)
pH, UA: 6 (ref 5.0–7.5)

## 2019-04-27 LAB — CBC
Hematocrit: 37.1 % (ref 34.0–46.6)
Hemoglobin: 11.7 g/dL (ref 11.1–15.9)
MCH: 28.3 pg (ref 26.6–33.0)
MCHC: 31.5 g/dL (ref 31.5–35.7)
MCV: 90 fL (ref 79–97)
Platelets: 356 10*3/uL (ref 150–450)
RBC: 4.13 x10E6/uL (ref 3.77–5.28)
RDW: 13 % (ref 11.7–15.4)
WBC: 3.2 10*3/uL — ABNORMAL LOW (ref 3.4–10.8)

## 2019-04-27 LAB — COMPREHENSIVE METABOLIC PANEL
ALT: 10 IU/L (ref 0–32)
AST: 20 IU/L (ref 0–40)
Albumin/Globulin Ratio: 1.2 (ref 1.2–2.2)
Albumin: 3.7 g/dL (ref 3.7–4.7)
Alkaline Phosphatase: 57 IU/L (ref 39–117)
BUN/Creatinine Ratio: 13 (ref 12–28)
BUN: 11 mg/dL (ref 8–27)
Bilirubin Total: 0.5 mg/dL (ref 0.0–1.2)
CO2: 23 mmol/L (ref 20–29)
Calcium: 9.4 mg/dL (ref 8.7–10.3)
Chloride: 94 mmol/L — ABNORMAL LOW (ref 96–106)
Creatinine, Ser: 0.87 mg/dL (ref 0.57–1.00)
GFR calc Af Amer: 75 mL/min/{1.73_m2} (ref >59)
GFR calc non Af Amer: 65 mL/min/{1.73_m2} (ref >59)
Globulin, Total: 3 g/dL (ref 1.5–4.5)
Glucose: 105 mg/dL — ABNORMAL HIGH (ref 65–99)
Potassium: 3.2 mmol/L — ABNORMAL LOW (ref 3.5–5.2)
Sodium: 137 mmol/L (ref 134–144)
Total Protein: 6.7 g/dL (ref 6.0–8.5)

## 2019-04-28 ENCOUNTER — Other Ambulatory Visit: Payer: Self-pay | Admitting: Medical

## 2019-04-29 ENCOUNTER — Telehealth: Payer: Self-pay | Admitting: Family Medicine

## 2019-04-29 NOTE — Telephone Encounter (Signed)
Informed husband of results.  She is having proteinuria that has been ongoing and gotten worse recently.  May need to be evaluated by nephrologist.    Rosemarie Ax, MD [facility] 04/29/2019, 11:03 AM

## 2019-04-30 ENCOUNTER — Other Ambulatory Visit: Payer: Self-pay | Admitting: Medical

## 2019-04-30 LAB — URINE CULTURE

## 2019-05-01 DIAGNOSIS — R531 Weakness: Secondary | ICD-10-CM | POA: Diagnosis not present

## 2019-05-01 DIAGNOSIS — E876 Hypokalemia: Secondary | ICD-10-CM | POA: Diagnosis not present

## 2019-05-01 DIAGNOSIS — R55 Syncope and collapse: Secondary | ICD-10-CM | POA: Diagnosis not present

## 2019-05-01 DIAGNOSIS — D649 Anemia, unspecified: Secondary | ICD-10-CM | POA: Diagnosis not present

## 2019-05-01 DIAGNOSIS — I959 Hypotension, unspecified: Secondary | ICD-10-CM | POA: Diagnosis not present

## 2019-05-01 DIAGNOSIS — R9431 Abnormal electrocardiogram [ECG] [EKG]: Secondary | ICD-10-CM | POA: Diagnosis not present

## 2019-05-01 DIAGNOSIS — R10819 Abdominal tenderness, unspecified site: Secondary | ICD-10-CM | POA: Diagnosis not present

## 2019-05-03 ENCOUNTER — Emergency Department (HOSPITAL_COMMUNITY): Payer: PPO

## 2019-05-03 ENCOUNTER — Other Ambulatory Visit: Payer: Self-pay

## 2019-05-03 ENCOUNTER — Encounter (HOSPITAL_COMMUNITY): Payer: Self-pay

## 2019-05-03 ENCOUNTER — Emergency Department (HOSPITAL_COMMUNITY)
Admission: EM | Admit: 2019-05-03 | Discharge: 2019-05-03 | Disposition: A | Discharge status: Home / Self Care | Payer: PPO | Attending: Emergency Medicine | Admitting: Emergency Medicine

## 2019-05-03 DIAGNOSIS — Z7982 Long term (current) use of aspirin: Secondary | ICD-10-CM | POA: Diagnosis not present

## 2019-05-03 DIAGNOSIS — E876 Hypokalemia: Secondary | ICD-10-CM | POA: Diagnosis not present

## 2019-05-03 DIAGNOSIS — E1142 Type 2 diabetes mellitus with diabetic polyneuropathy: Secondary | ICD-10-CM | POA: Diagnosis not present

## 2019-05-03 DIAGNOSIS — I10 Essential (primary) hypertension: Secondary | ICD-10-CM | POA: Insufficient documentation

## 2019-05-03 DIAGNOSIS — R531 Weakness: Secondary | ICD-10-CM | POA: Diagnosis not present

## 2019-05-03 DIAGNOSIS — R410 Disorientation, unspecified: Secondary | ICD-10-CM | POA: Diagnosis not present

## 2019-05-03 DIAGNOSIS — K449 Diaphragmatic hernia without obstruction or gangrene: Secondary | ICD-10-CM | POA: Diagnosis not present

## 2019-05-03 DIAGNOSIS — R1110 Vomiting, unspecified: Secondary | ICD-10-CM | POA: Diagnosis not present

## 2019-05-03 DIAGNOSIS — Z79899 Other long term (current) drug therapy: Secondary | ICD-10-CM | POA: Diagnosis not present

## 2019-05-03 DIAGNOSIS — R4182 Altered mental status, unspecified: Secondary | ICD-10-CM | POA: Diagnosis not present

## 2019-05-03 LAB — COMPREHENSIVE METABOLIC PANEL
ALT: 9 U/L (ref 0–44)
AST: 15 U/L (ref 15–41)
Albumin: 2.6 g/dL — ABNORMAL LOW (ref 3.5–5.0)
Alkaline Phosphatase: 51 U/L (ref 38–126)
Anion gap: 11 (ref 5–15)
BUN: 7 mg/dL — ABNORMAL LOW (ref 8–23)
CO2: 28 mmol/L (ref 22–32)
Calcium: 9 mg/dL (ref 8.9–10.3)
Chloride: 99 mmol/L (ref 98–111)
Creatinine, Ser: 0.88 mg/dL (ref 0.44–1.00)
GFR calc Af Amer: >60 mL/min (ref >60)
GFR calc non Af Amer: >60 mL/min (ref >60)
Glucose, Bld: 115 mg/dL — ABNORMAL HIGH (ref 70–99)
Potassium: 2.8 mmol/L — ABNORMAL LOW (ref 3.5–5.1)
Sodium: 138 mmol/L (ref 135–145)
Total Bilirubin: 0.7 mg/dL (ref 0.3–1.2)
Total Protein: 6.4 g/dL — ABNORMAL LOW (ref 6.5–8.1)

## 2019-05-03 LAB — URINALYSIS, ROUTINE W REFLEX MICROSCOPIC
Bacteria, UA: NONE SEEN
Bilirubin Urine: NEGATIVE
Glucose, UA: NEGATIVE mg/dL
Hgb urine dipstick: NEGATIVE
Ketones, ur: 5 mg/dL — AB
Leukocytes,Ua: NEGATIVE
Nitrite: NEGATIVE
Protein, ur: 100 mg/dL — AB
Specific Gravity, Urine: 1.004 — ABNORMAL LOW (ref 1.005–1.030)
pH: 7 (ref 5.0–8.0)

## 2019-05-03 LAB — CBC WITH DIFFERENTIAL/PLATELET
Abs Immature Granulocytes: 0.02 10*3/uL (ref 0.00–0.07)
Basophils Absolute: 0 10*3/uL (ref 0.0–0.1)
Basophils Relative: 0 %
Eosinophils Absolute: 0 10*3/uL (ref 0.0–0.5)
Eosinophils Relative: 0 %
HCT: 33.3 % — ABNORMAL LOW (ref 36.0–46.0)
Hemoglobin: 10.6 g/dL — ABNORMAL LOW (ref 12.0–15.0)
Immature Granulocytes: 0 %
Lymphocytes Relative: 36 %
Lymphs Abs: 1.6 10*3/uL (ref 0.7–4.0)
MCH: 28.1 pg (ref 26.0–34.0)
MCHC: 31.8 g/dL (ref 30.0–36.0)
MCV: 88.3 fL (ref 80.0–100.0)
Monocytes Absolute: 0.6 10*3/uL (ref 0.1–1.0)
Monocytes Relative: 13 %
Neutro Abs: 2.3 10*3/uL (ref 1.7–7.7)
Neutrophils Relative %: 51 %
Platelets: 518 10*3/uL — ABNORMAL HIGH (ref 150–400)
RBC: 3.77 MIL/uL — ABNORMAL LOW (ref 3.87–5.11)
RDW: 12.5 % (ref 11.5–15.5)
WBC: 4.5 10*3/uL (ref 4.0–10.5)
nRBC: 0 % (ref 0.0–0.2)

## 2019-05-03 LAB — POC OCCULT BLOOD, ED: Fecal Occult Bld: NEGATIVE

## 2019-05-03 MED ORDER — POTASSIUM CHLORIDE 10 MEQ/100ML IV SOLN
10.00 | INTRAVENOUS | Status: DC
Start: 2019-05-01 — End: 2019-05-03

## 2019-05-03 MED ORDER — POTASSIUM CHLORIDE 20 MEQ/15ML (10%) PO SOLN
40.0000 meq | Freq: Once | ORAL | Status: AC
  Administered 2019-05-03: 12:00:00 40 meq via ORAL
  Filled 2019-05-03: qty 30

## 2019-05-03 MED ORDER — IOHEXOL 9 MG/ML PO SOLN
ORAL | Status: AC
  Filled 2019-05-03: qty 1000

## 2019-05-03 MED ORDER — IOHEXOL 9 MG/ML PO SOLN: 1000.0000 mL | ORAL | Status: AC

## 2019-05-03 MED ORDER — IOHEXOL 300 MG/ML  SOLN
100.0000 mL | Freq: Once | INTRAMUSCULAR | Status: AC | PRN
  Administered 2019-05-03: 100 mL via INTRAVENOUS

## 2019-05-03 MED ORDER — POTASSIUM CHLORIDE 40 MEQ/15ML (20%) PO SOLN: 40.0000 mg | Freq: Every day | ORAL | 0 refills | Status: DC

## 2019-05-03 MED ORDER — SODIUM CHLORIDE 0.9 % IV BOLUS (SEPSIS)
500.0000 mL | Freq: Once | INTRAVENOUS | Status: AC
  Administered 2019-05-03: 09:00:00 500 mL via INTRAVENOUS

## 2019-05-03 MED ORDER — SODIUM CHLORIDE 0.9 % IV SOLN
1000.0000 mL | INTRAVENOUS | Status: DC
  Administered 2019-05-03: 1000 mL via INTRAVENOUS

## 2019-05-03 MED ORDER — SODIUM CHLORIDE (PF) 0.9 % IJ SOLN
INTRAMUSCULAR | Status: AC
  Filled 2019-05-03: qty 50

## 2019-05-03 NOTE — ED Provider Notes (Signed)
[facility] DEPT Provider Note   CSN: [id number] Arrival date & time: 05/03/19  0962     History   Chief Complaint Anorexia, weakness  HPI Meredith Phillips is a 73 y.o. female.     HPI Pt remembers waiting for a ride to take her home this morning.  She remembers then being at her home.  She talked to her grandson and went to sleep.  Pt remembers wanting to take a shower.  Pt tells me about her family asking how she was and about going to 20.  Pt states she asked her husband to bring her to the emergency room.  She does not know why she is here.  She tells me about sleeping today. She also tells me about family saying she wont eat.  Pt states she is not hungry. Past Medical History:  Diagnosis Date   Arthritis    Benign essential HTN 02/18/2015   Cellulitis 02/18/2015   Chicken pox    Chronic fatigue    Dementia ([place])    Depression    since last 04/2018 daughter passed.     Diabetes mellitus without complication ([place])    Diabetic peripheral neuropathy (HCC)    GERD (gastroesophageal reflux disease)    Hypercholesteremia    Hypertension    Liver disease    Polyp of colon     Patient Active Problem List   Diagnosis Date Noted   Malaise and fatigue 04/26/2019   Head trauma 03/29/2019   Greater trochanteric pain syndrome of left lower extremity 01/18/2019   Hyperlipidemia 03/26/2018   Hyponatremia 03/26/2018   Atypical chest pain 03/25/2018   Urge incontinence 02/25/2018   Glucosuria 02/25/2018   Anxiety and depression 12/16/2017   Memory loss 11/02/2017   B12 deficiency 11/02/2017   Vertigo 10/10/2017   Trochanteric bursitis of left hip 10/10/2017   Murmur, cardiac 04/15/2017   Asymptomatic postmenopausal estrogen deficiency 04/15/2017   Right-sided low back pain without sciatica 02/10/2017   Grief at loss of child 07/29/2016   Allergic rhinitis 02/27/2016   GERD (gastroesophageal reflux disease)  08/06/2015   Baker's cyst, ruptured 08/03/2015   Type 2 diabetes mellitus ([place]) 06/29/2015   It band syndrome, left 06/15/2015   Benign essential HTN 02/18/2015   Cellulitis 02/18/2015   Sciatica 01/17/2015   Routine general medical examination at a health care facility 12/26/2014   Medicare annual wellness visit, subsequent 12/26/2014   Generalized osteoarthritis of multiple sites 11/25/2014   GERD with esophagitis 11/25/2014   Hip arthrosis 11/25/2014   Diarrhea in adult patient 11/29/2013    Past Surgical History:  Procedure Laterality Date   APPENDECTOMY     HERNIA REPAIR     KNEE SURGERY     ROTATOR CUFF REPAIR     TONSILLECTOMY       OB History   No obstetric history on file.      Home Medications    Prior to Admission medications   Medication Sig Start Date End Date Taking? Authorizing Provider  acetaZOLAMIDE (DIAMOX) 250 MG tablet TAKE 1 TABLET BY MOUTH TWICE A DAY Patient taking differently: Take 250 mg by mouth 2 (two) times daily.  02/22/19   Saguier, Percell Miller, PA-C  amLODipine (NORVASC) 10 MG tablet Take 1 tablet (10 mg total) by mouth daily. 04/12/19   Saguier, Percell Miller, PA-C  aspirin EC 81 MG tablet Take 81 mg by mouth daily.    [provider]  atorvastatin (LIPITOR) 20 MG tablet Take 1 tablet (  20 mg total) by mouth daily. 09/28/18   Saguier, Percell Miller, PA-C  blood glucose meter kit and supplies Dispense based on patient and insurance preference. Use up to four times daily as directed. (FOR ICD-10 E10.9, E11.9). 04/01/19   Saguier, Percell Miller, PA-C  Blood Glucose Monitoring Suppl (ONE TOUCH ULTRA 2) w/Device KIT Use to check blood sugars twice a day Dx E11.9 09/01/17   Marrian Salvage, FNP  Cholecalciferol (VITAMIN D-3) 1000 UNITS CAPS Take 1,000 Units by mouth.    [provider]  cloNIDine (CATAPRES) 0.1 MG tablet Take 1 tablet (0.1 mg total) by mouth 2 (two) times daily. 10/11/18   Saguier, Percell Miller, PA-C  cyanocobalamin 1000 MCG  tablet Take 1,000 mcg by mouth daily.    [provider]  esomeprazole (NEXIUM) 40 MG capsule TAKE 1 CAPSULE (40 MG TOTAL) BY MOUTH DAILY AT 12 NOON. 04/01/19   Saguier, Percell Miller, PA-C  famotidine (PEPCID) 20 MG tablet TAKE 1 TABLET BY MOUTH TWICE A DAY Patient not taking: Reported on 05/03/2019 12/08/18   Saguier, Percell Miller, PA-C  gabapentin (NEURONTIN) 300 MG capsule Take 1 capsule (300 mg total) by mouth 3 (three) times daily. TAKE 1 CAPSULE BY MOUTH THREE TIMES A DAY Patient not taking: Reported on 05/03/2019 05/17/18   Lance Sell, NP  glucose blood (FREESTYLE LITE) test strip USE AS DIRECTED UP TO 4 TIMES DAILY 04/28/19   Saguier, Percell Miller, PA-C  JANUVIA 100 MG tablet TAKE 1 TABLET BY MOUTH EVERY DAY Patient taking differently: Take 100 mg by mouth daily.  05/02/19   Saguier, Percell Miller, PA-C  Lancets [facility] ULTRASOFT) lancets USE TO HELP CHECK BLOOD SUGARS TWICE A DAY E11.9 11/30/17   Lance Sell, NP  meclizine (ANTIVERT) 25 MG tablet TAKE 1/2 TO 1 TABLET BY MOUTH TWICE A DAY AS NEEDED FOR DIZZINESS Patient taking differently: Take 12.5-25 mg by mouth 2 (two) times daily as needed for dizziness.  03/28/19   Saguier, Percell Miller, PA-C  meloxicam (MOBIC) 7.5 MG tablet Take 1 tablet (7.5 mg total) by mouth daily. 03/28/19   Saguier, Percell Miller, PA-C  methocarbamol (ROBAXIN) 500 MG tablet Take 1 tablet (500 mg total) by mouth 2 (two) times daily. 04/16/19   Lucrezia Starch, MD  metoprolol succinate (TOPROL-XL) 25 MG 24 hr tablet Take 1 tablet (25 mg total) by mouth daily. 03/04/19   Saguier, Percell Miller, PA-C  Multiple Vitamin (MULTIVITAMIN) tablet Take 1 tablet by mouth daily.    [provider]  Potassium Chloride 40 MEQ/15ML (20%) SOLN Take 40 mg by mouth daily for 7 days. 05/03/19 05/10/19  Dorie Rank, MD  predniSONE (DELTASONE) 5 MG tablet Take 6 pills for first day, 5 pills second day, 4 pills third day, 3 pills fourth day, 2 pills the fifth day, and 1 pill sixth day. 03/29/19    Rosemarie Ax, MD  terconazole (TERAZOL 3) 0.8 % vaginal cream Place 1 applicator vaginally at bedtime. 09/22/18   Saguier, Percell Miller, PA-C    Family History Family History  Problem Relation Age of Onset   Healthy Mother    Healthy Father    Healthy Paternal Grandmother    Healthy Paternal Grandfather     Social History Social History   Tobacco Use   Smoking status: Never Smoker   Smokeless tobacco: Never Used  Substance Use Topics   Alcohol use: No   Drug use: No     Allergies   Codeine, Januvia [sitagliptin], Other, and Shellfish allergy   Review of Systems Review of  Systems  Constitutional: Negative for fever.  HENT: Negative for rhinorrhea.   Cardiovascular: Negative for chest pain.  Gastrointestinal: Negative for abdominal pain.  Genitourinary: Positive for dysuria.  Neurological: Negative for headaches.  Psychiatric/Behavioral: Positive for confusion.     Physical Exam Updated Vital Signs BP (!) 151/84    Pulse 63    Temp 98.7 F (37.1 C) (Oral)    Resp 15    Ht 1.524 m (5')    Wt 51.7 kg    SpO2 99%    BMI 22.26 kg/m   Physical Exam Vitals signs and nursing note reviewed.  Constitutional:      General: She is not in acute distress.    Appearance: She is well-developed.  HENT:     Head: Normocephalic and atraumatic.     Right Ear: External ear normal.     Left Ear: External ear normal.  Eyes:     General: No scleral icterus.       Right eye: No discharge.        Left eye: No discharge.     Conjunctiva/sclera: Conjunctivae normal.  Neck:     Musculoskeletal: Neck supple.     Trachea: No tracheal deviation.  Cardiovascular:     Rate and Rhythm: Normal rate and regular rhythm.  Pulmonary:     Effort: Pulmonary effort is normal. No respiratory distress.     Breath sounds: Normal breath sounds. No stridor. No wheezing or rales.  Abdominal:     General: Bowel sounds are normal. There is no distension.     Palpations: Abdomen is soft.      Tenderness: There is no abdominal tenderness. There is no guarding or rebound.  Musculoskeletal:        General: No tenderness.  Skin:    General: Skin is warm and dry.     Findings: No rash.  Neurological:     Mental Status: She is alert. She is disoriented.     GCS: GCS eye subscore is 4. GCS verbal subscore is 4. GCS motor subscore is 6.     Cranial Nerves: No cranial nerve deficit (no facial droop, extraocular movements intact, no slurred speech).     Sensory: No sensory deficit.     Motor: No abnormal muscle tone or seizure activity.     Coordination: Coordination normal.     Comments: Confused about year      ED Treatments / Results  Labs (all labs ordered are listed, but only abnormal results are displayed) Labs Reviewed  CBC WITH DIFFERENTIAL/PLATELET - Abnormal; Notable for the following components:      Result Value   RBC 3.77 (*)    Hemoglobin 10.6 (*)    HCT 33.3 (*)    Platelets 518 (*)    All other components within normal limits  COMPREHENSIVE METABOLIC PANEL - Abnormal; Notable for the following components:   Potassium 2.8 (*)    Glucose, Bld 115 (*)    BUN 7 (*)    Total Protein 6.4 (*)    Albumin 2.6 (*)    All other components within normal limits  URINALYSIS, ROUTINE W REFLEX MICROSCOPIC - Abnormal; Notable for the following components:   Color, Urine STRAW (*)    Specific Gravity, Urine 1.004 (*)    Ketones, ur 5 (*)    Protein, ur 100 (*)    All other components within normal limits  POC OCCULT BLOOD, ED    EKG EKG Interpretation  Date/Time:  Tuesday May 03 2019 08:30:28 EST Ventricular Rate:  74 PR Interval:    QRS Duration: 92 QT Interval:  471 QTC Calculation: 523 R Axis:   15 Text Interpretation: Sinus rhythm Nonspecific T abnormalities, diffuse leads Prolonged QT interval No STEMI Confirmed by Nanda Quinton [phone]) on 05/03/2019 8:33:25 AM   Radiology Ct Abdomen Pelvis W Contrast  Result Date: 05/03/2019 CLINICAL DATA:  Mid  upper abdominal pain and vomiting EXAM: CT ABDOMEN AND PELVIS WITH CONTRAST TECHNIQUE: Multidetector CT imaging of the abdomen and pelvis was performed using the standard protocol following bolus administration of intravenous contrast. CONTRAST:  133m OMNIPAQUE IOHEXOL 300 MG/ML  SOLN COMPARISON:  06/18/2013 FINDINGS: Lower chest: Moderate sized hiatal hernia with small amount of fluid and debris. Patchy dependent bibasilar airspace opacities. Hepatobiliary: No focal liver abnormality is seen. Gallbladder is partially contracted but appears grossly unremarkable. Pancreas: Unremarkable. No pancreatic ductal dilatation or surrounding inflammatory changes. Spleen: Normal in size without focal abnormality. Adrenals/Urinary Tract: Unremarkable adrenal glands. 2.1 cm simple cyst in the inferior pole of the right kidney. There are a few additional scattered subcentimeter low-density lesions in the bilateral kidneys, too small to characterize, but most likely represent cysts. Kidneys are otherwise unremarkable. No hydronephrosis. Ureters are nondilated. Urinary bladder is unremarkable. Stomach/Bowel: The distal small bowel and: Are well distended with enteric contrast. No dilated loops of bowel. Scattered colonic diverticulosis. No focal colonic wall thickening or pericolonic inflammatory changes. Appendix not clearly visualized and may be surgically absent. Vascular/Lymphatic: Aortic atherosclerosis. No enlarged abdominal or pelvic lymph nodes. Reproductive: Status post hysterectomy. No adnexal masses. Other: No abdominal wall hernia or abnormality. No abdominopelvic ascites. Musculoskeletal: Lumbar levocurvature with associated degenerative changes. No acute osseous findings. IMPRESSION: 1. Moderate-sized hiatal hernia with small amount of fluid and debris. Patchy dependent bibasilar airspace opacities may represent aspiration versus infection. 2. No acute abdominopelvic findings. 3. Colonic diverticulosis without acute  diverticulitis. 4. Aortic atherosclerosis. Aortic Atherosclerosis (ICD10-I70.0). Electronically Signed   By: NDavina PokeM.D.   On: 05/03/2019 15:48   Dg Chest Portable 1 View  Result Date: 05/03/2019 CLINICAL DATA:  Weakness and altered mental status EXAM: PORTABLE CHEST 1 VIEW COMPARISON:  12/27/2018 FINDINGS: Heart size upper normal. Negative for heart failure. Atherosclerotic aortic arch Lungs well aerated and clear.  No infiltrate effusion or mass. Thoracic scoliosis convex to the right. IMPRESSION: No active disease. Electronically Signed   By: CFranchot GalloM.D.   On: 05/03/2019 09:51    Procedures Procedures (including critical care time)  Medications Ordered in ED Medications  sodium chloride 0.9 % bolus 500 mL (0 mLs Intravenous Stopped 05/03/19 0945)    Followed by  0.9 %  sodium chloride infusion (1,000 mLs Intravenous New Bag/Given 05/03/19 1002)  iohexol (OMNIPAQUE) 9 MG/ML oral solution 1,000 mL (has no administration in time range)  iohexol (OMNIPAQUE) 9 MG/ML oral solution (has no administration in time range)  sodium chloride (PF) 0.9 % injection (has no administration in time range)  potassium chloride 20 MEQ/15ML (10%) solution 40 mEq (40 mEq Oral Given 05/03/19 1212)  iohexol (OMNIPAQUE) 300 MG/ML solution 100 mL (100 mLs Intravenous Contrast Given 05/03/19 1527)     Initial Impression / Assessment and Plan / ED Course  I have reviewed the triage vital signs and the nursing notes.  Pertinent labs & imaging results that were available during my care of the patient were reviewed by me and considered in my medical decision making (see chart for details).  Clinical Course as of Nov  [place] May 03, 2019  0850 Records reviewed.  Pt seen at [place] [facility] ED on 11/15.  History that pt provides today actually seems to be part of the history provided on that days visit.    [JK]  1142 Electrolytes reviewed.  Potassium is low.   [KF]  2761 Urine without signs of  infection.   [JK]  1143 Hemoglobin decreased from 1 week ago.   [JK]  1159 Rectal exam performed.  Brown stool   [JK]  1232 Updated patient and family regarding findings.  Patient now complaining of pain in her left lower quadrant.  She does have focal tenderness.  Considering her anorexia I have ordered a CT scan of her abdomen and pelvis.   [JK]  1615 CT scan reviewed.  No acute findings.  Stable for discharge   [JK]    Clinical Course User Index [JK] Dorie Rank, MD     Patient presented to the ED for evaluation of anorexia and weakness.  Family eventually arrived to the ED and were able to supplement the patient's history.  Their main issue today was that she was not eating as well and she seemed to be weak.  She was in the hospital recently for evaluation and they want to make sure nothing is changed.  Patient's ED evaluation evaluation is notable for hypokalemia.  She was given oral supplementation and I will give her prescription for that.  She has not had any vomiting or diarrhea.  Patient did have an episode of abdominal pain so CT scan was performed but there are no acute findings.  She does have a hiatal hernia but I doubt this is causing her issues with her appetite.  At this point patient appears stable for outpatient follow-up.  I will give her prescription for potassium replacement.  I also recommend that she make sure to drink fluids and try adding boost or Ensure.  Final Clinical Impressions(s) / ED Diagnoses   Final diagnoses:  Hiatal hernia  Hypokalemia    ED Discharge Orders         Ordered    Potassium Chloride 40 MEQ/15ML (20%) SOLN  Daily     05/03/19 1622           Dorie Rank, MD 05/03/19 1623

## 2019-05-03 NOTE — ED Triage Notes (Signed)
Pt states that "she was on [address] then she suddenly was home and didn't know where she was. I was just feeling bad". Hx: dementia.

## 2019-05-03 NOTE — ED Notes (Signed)
Urine specimen cup at bedside. Patient and son instructed to let RN/NT know when patient needs to use the restroom.

## 2019-05-03 NOTE — ED Notes (Addendum)
In and out cath performed with Caryl Pina, NT. 600 cc urine.

## 2019-05-03 NOTE — Discharge Instructions (Addendum)
The CT scan did not show any signs of infection or obstruction.  You do have a hiatal hernia but this should not necessarily cause trouble with your appetite.  Make sure to drink fluids and supplementing your diet with boost or Ensure.  You can take Tylenol as needed for pain.  Follow-up with your primary care doctor as we discussed.

## 2019-05-04 ENCOUNTER — Telehealth: Payer: Self-pay | Admitting: Family Medicine

## 2019-05-04 NOTE — Telephone Encounter (Signed)
Informed patient's husband of urine culture.   Rosemarie Ax, MD [facility] 05/04/2019, 4:49 PM

## 2019-05-09 DIAGNOSIS — R072 Precordial pain: Secondary | ICD-10-CM | POA: Diagnosis not present

## 2019-05-09 DIAGNOSIS — E559 Vitamin D deficiency, unspecified: Secondary | ICD-10-CM | POA: Diagnosis not present

## 2019-05-09 DIAGNOSIS — E538 Deficiency of other specified B group vitamins: Secondary | ICD-10-CM | POA: Diagnosis not present

## 2019-05-09 DIAGNOSIS — R5383 Other fatigue: Secondary | ICD-10-CM | POA: Diagnosis not present

## 2019-05-13 DIAGNOSIS — E876 Hypokalemia: Secondary | ICD-10-CM | POA: Diagnosis not present

## 2019-05-13 DIAGNOSIS — N390 Urinary tract infection, site not specified: Secondary | ICD-10-CM | POA: Diagnosis not present

## 2019-05-13 DIAGNOSIS — R300 Dysuria: Secondary | ICD-10-CM | POA: Diagnosis not present

## 2019-05-13 DIAGNOSIS — Z1159 Encounter for screening for other viral diseases: Secondary | ICD-10-CM | POA: Diagnosis not present

## 2019-05-13 DIAGNOSIS — D539 Nutritional anemia, unspecified: Secondary | ICD-10-CM | POA: Diagnosis not present

## 2019-05-13 DIAGNOSIS — E559 Vitamin D deficiency, unspecified: Secondary | ICD-10-CM | POA: Diagnosis not present

## 2019-05-13 DIAGNOSIS — E1165 Type 2 diabetes mellitus with hyperglycemia: Secondary | ICD-10-CM | POA: Diagnosis not present

## 2019-05-20 ENCOUNTER — Other Ambulatory Visit: Payer: Self-pay | Admitting: Medical

## 2019-05-26 DIAGNOSIS — H43821 Vitreomacular adhesion, right eye: Secondary | ICD-10-CM | POA: Diagnosis not present

## 2019-05-26 DIAGNOSIS — H3581 Retinal edema: Secondary | ICD-10-CM | POA: Diagnosis not present

## 2019-05-26 DIAGNOSIS — H34831 Tributary (branch) retinal vein occlusion, right eye, with macular edema: Secondary | ICD-10-CM | POA: Diagnosis not present

## 2019-05-27 DIAGNOSIS — K219 Gastro-esophageal reflux disease without esophagitis: Secondary | ICD-10-CM | POA: Diagnosis not present

## 2019-05-27 DIAGNOSIS — I10 Essential (primary) hypertension: Secondary | ICD-10-CM | POA: Diagnosis not present

## 2019-05-27 DIAGNOSIS — D509 Iron deficiency anemia, unspecified: Secondary | ICD-10-CM | POA: Diagnosis not present

## 2019-05-31 ENCOUNTER — Other Ambulatory Visit: Payer: Self-pay | Admitting: Medical

## 2019-06-05 ENCOUNTER — Other Ambulatory Visit: Payer: Self-pay | Admitting: Medical

## 2019-06-23 ENCOUNTER — Other Ambulatory Visit: Payer: Self-pay | Admitting: Medical

## 2019-06-24 DIAGNOSIS — E1165 Type 2 diabetes mellitus with hyperglycemia: Secondary | ICD-10-CM | POA: Diagnosis not present

## 2019-06-24 DIAGNOSIS — R519 Headache, unspecified: Secondary | ICD-10-CM | POA: Diagnosis not present

## 2019-06-24 DIAGNOSIS — R300 Dysuria: Secondary | ICD-10-CM | POA: Diagnosis not present

## 2019-06-24 DIAGNOSIS — N390 Urinary tract infection, site not specified: Secondary | ICD-10-CM | POA: Diagnosis not present

## 2019-07-12 DIAGNOSIS — E1165 Type 2 diabetes mellitus with hyperglycemia: Secondary | ICD-10-CM | POA: Diagnosis not present

## 2019-07-12 DIAGNOSIS — N390 Urinary tract infection, site not specified: Secondary | ICD-10-CM | POA: Diagnosis not present

## 2019-07-12 DIAGNOSIS — E782 Mixed hyperlipidemia: Secondary | ICD-10-CM | POA: Diagnosis not present

## 2019-07-12 DIAGNOSIS — R300 Dysuria: Secondary | ICD-10-CM | POA: Diagnosis not present

## 2019-07-12 DIAGNOSIS — I10 Essential (primary) hypertension: Secondary | ICD-10-CM | POA: Diagnosis not present

## 2019-07-21 ENCOUNTER — Other Ambulatory Visit: Payer: Self-pay | Admitting: Medical

## 2019-07-22 DIAGNOSIS — R300 Dysuria: Secondary | ICD-10-CM | POA: Diagnosis not present

## 2019-07-22 DIAGNOSIS — R42 Dizziness and giddiness: Secondary | ICD-10-CM | POA: Diagnosis not present

## 2019-07-22 DIAGNOSIS — B9620 Unspecified Escherichia coli [E. coli] as the cause of diseases classified elsewhere: Secondary | ICD-10-CM | POA: Diagnosis not present

## 2019-07-22 DIAGNOSIS — E1165 Type 2 diabetes mellitus with hyperglycemia: Secondary | ICD-10-CM | POA: Diagnosis not present

## 2019-07-22 DIAGNOSIS — Z20822 Contact with and (suspected) exposure to covid-19: Secondary | ICD-10-CM | POA: Diagnosis not present

## 2019-07-22 DIAGNOSIS — N390 Urinary tract infection, site not specified: Secondary | ICD-10-CM | POA: Diagnosis not present

## 2019-07-22 NOTE — Telephone Encounter (Signed)
Refilled her meloxicam but she needs in office visit to check kidney function, metabolic panel and a1c. Please get her scheduled.

## 2019-07-22 NOTE — Telephone Encounter (Signed)
When I call pt, she was already at another appt and unable to talk. She will call back

## 2019-07-26 DIAGNOSIS — E041 Nontoxic single thyroid nodule: Secondary | ICD-10-CM | POA: Diagnosis not present

## 2019-07-26 DIAGNOSIS — R42 Dizziness and giddiness: Secondary | ICD-10-CM | POA: Diagnosis not present

## 2019-07-27 ENCOUNTER — Telehealth: Payer: Self-pay | Admitting: Medical

## 2019-07-27 DIAGNOSIS — H34831 Tributary (branch) retinal vein occlusion, right eye, with macular edema: Secondary | ICD-10-CM | POA: Diagnosis not present

## 2019-07-27 NOTE — Chronic Care Management (AMB) (Signed)
  Chronic Care Management   Note  07/27/2019 Name: Meredith Phillips MRN: [id number] DOB: 16-May-1946  Meredith Phillips is a 74 y.o. year old female who is a primary care patient of Saguier, Iris Pert. I reached out to Vilinda Flake by phone today in response to a referral sent by [name]'s PCP, Saguier, Percell Miller, PA-C. Husband, Jiles Prows Ribsy gave verbal consent for his wife, he will speak to the pharmacist.  [name] was given information about Chronic Care Management services today including:  1. CCM service includes personalized support from designated clinical staff supervised by her physician, including individualized plan of care and coordination with other care providers 2. 24/7 contact phone numbers for assistance for urgent and routine care needs. 3. Service will only be billed when office clinical staff spend 20 minutes or more in a month to coordinate care. 4. Only one practitioner may furnish and bill the service in a calendar month. 5. The patient may stop CCM services at any time (effective at the end of the month) by phone call to the office staff. 6. The patient will be responsible for cost sharing (co-pay) of up to 20% of the service fee (after annual deductible is met).  Patient agreed to services and verbal consent obtained.   Follow up plan:   [name] [employer] Scheduler

## 2019-07-28 ENCOUNTER — Other Ambulatory Visit: Payer: Self-pay

## 2019-07-28 DIAGNOSIS — I10 Essential (primary) hypertension: Secondary | ICD-10-CM

## 2019-07-28 DIAGNOSIS — K5190 Ulcerative colitis, unspecified, without complications: Secondary | ICD-10-CM | POA: Diagnosis not present

## 2019-07-28 DIAGNOSIS — E118 Type 2 diabetes mellitus with unspecified complications: Secondary | ICD-10-CM

## 2019-07-28 NOTE — Progress Notes (Signed)
CCM

## 2019-08-02 ENCOUNTER — Telehealth: Payer: PPO

## 2019-08-02 ENCOUNTER — Telehealth: Payer: Self-pay | Admitting: Pharmacist

## 2019-08-02 NOTE — Chronic Care Management (AMB) (Deleted)
Chronic Care Management Pharmacy  Name: Meredith Phillips  MRN: [id number] DOB: [birth year]  Chief Complaint/ HPI  Meredith Phillips,  74 y.o. , female presents for their Initial CCM visit with the clinical pharmacist via telephone.  PCP : Mackie Pai, PA-C  Their chronic conditions include: HTN, DMII, HLD, GERD, Osteoarthritis, Vitamin D Deficiency, Vitamin B12 Deficiency  Office Visits: 04/01/19: Office visit w/ Mackie Pai, PA-C - Frequent urination (ua and culture ordered), diabetes (a1c ordered), refilled meclizine for vertigo and meloxicam.   03/28/15: Video visit w/ Mackie Pai, PA-C -   03/22/19: Office visit w/ Debbrah Alar, NP - Benign Paroxysmal Positional Vertigo and dry skin concern. Continued use of PRN meclizine and provided recommendation for moisturizer such as vaseline intensive care or Eucerin  Consult Visits: 11 Hospitalizations/ED: 05/03/19: ED at [facility] and weakness as chief complaint. Pt found to by hypokalemic and was given oral supplementation. Pt reported abdominal pain, but no acute findings with CT scan. Prescription given for potassium with recommendation to drink fluids and add boost or ensure.   05/01/19: ED at [facility] - LOC, hypokalemia. Repleted with IV potassium. Abdominal CT unremarkable. Recommendation for ECHO and telemetry monitoring, but pt and family wished to go home. Recommended close follow up with PCP  Medications: Outpatient Encounter Medications as of 08/02/2019  Medication Sig Note  . acetaZOLAMIDE (DIAMOX) 250 MG tablet TAKE 1 TABLET BY MOUTH TWICE A Foye Haggart (Patient taking differently: Take 250 mg by mouth 2 (two) times daily. ) 05/03/2019: 15 Ladamien Rammel supply Last filled 03/17/2019  . amLODipine (NORVASC) 10 MG tablet Take 1 tablet (10 mg total) by mouth daily. 05/03/2019: 90 Adrie Picking supply Last filled 04/30/2019  . aspirin EC 81 MG tablet Take 81 mg by mouth daily.   [name] atorvastatin (LIPITOR) 20 MG tablet Take 1  tablet (20 mg total) by mouth daily. 05/03/2019: 90 Jillian Warth supply Last filled 03/25/2019  . blood glucose meter kit and supplies Dispense based on patient and insurance preference. Use up to four times daily as directed. (FOR ICD-10 E10.9, E11.9).   [name] Blood Glucose Monitoring Suppl (ONE TOUCH ULTRA 2) w/Device KIT Use to check blood sugars twice a Jessic Standifer Dx E11.9   . Cholecalciferol (VITAMIN D-3) 1000 UNITS CAPS Take 1,000 Units by mouth.   . cloNIDine (CATAPRES) 0.1 MG tablet Take 1 tablet (0.1 mg total) by mouth 2 (two) times daily. 05/03/2019: 90 Renesme Kerrigan supply Last filled 02/06/2019  . cyanocobalamin 1000 MCG tablet Take 1,000 mcg by mouth daily.   [name] esomeprazole (NEXIUM) 40 MG capsule TAKE 1 CAPSULE (40 MG TOTAL) BY MOUTH DAILY AT 12 NOON. 05/03/2019: 90 Aliviana Burdell supply Last filled 04/01/2019  . famotidine (PEPCID) 20 MG tablet TAKE 1 TABLET BY MOUTH TWICE A Assia Meanor (Patient not taking: Reported on 05/03/2019)   . FREESTYLE LITE test strip USE AS DIRECTED UP TO 4 TIMES DAILY   . gabapentin (NEURONTIN) 300 MG capsule Take 1 capsule (300 mg total) by mouth 3 (three) times daily. TAKE 1 CAPSULE BY MOUTH THREE TIMES A Asaad Gulley (Patient not taking: Reported on 05/03/2019)   . JANUVIA 100 MG tablet TAKE 1 TABLET BY MOUTH EVERY Asa Fath (Patient taking differently: Take 100 mg by mouth daily. ) 05/03/2019: 90 Caelan Branden supply Last filled 04/22/2019  . Lancets (ONETOUCH ULTRASOFT) lancets USE TO HELP CHECK BLOOD SUGARS TWICE A Shareeka Yim E11.9   . meclizine (ANTIVERT) 25 MG tablet TAKE 1/2 TO 1 TABLET BY MOUTH TWICE A Aleksandra Raben AS NEEDED FOR  DIZZINESS (Patient taking differently: Take 12.5-25 mg by mouth 2 (two) times daily as needed for dizziness. ) 05/03/2019: 30 Taryn Shellhammer supply Last filled 03/28/2019  . meloxicam (MOBIC) 7.5 MG tablet Take 1 tablet (7.5 mg total) by mouth daily.   . methocarbamol (ROBAXIN) 500 MG tablet Take 1 tablet (500 mg total) by mouth 2 (two) times daily.   . metoprolol succinate (TOPROL-XL) 25 MG 24 hr tablet Take 1 tablet (25 mg  total) by mouth daily. 05/03/2019: 90 Yannis Broce supply Last filled 04/30/2019  . Multiple Vitamin (MULTIVITAMIN) tablet Take 1 tablet by mouth daily.   . Potassium Chloride 40 MEQ/15ML (20%) SOLN Take 40 mg by mouth daily for 7 days.   . predniSONE (DELTASONE) 5 MG tablet Take 6 pills for first Haywood Meinders, 5 pills second Rhylan Kagel, 4 pills third Ruble Buttler, 3 pills fourth Amiylah Anastos, 2 pills the fifth Macyn Shropshire, and 1 pill sixth Oseas Detty.   . terconazole (TERAZOL 3) 0.8 % vaginal cream Place 1 applicator vaginally at bedtime.    No facility-administered encounter medications on file as of 08/02/2019.   Immunization History  Administered Date(s) Administered  . Fluad Quad(high Dose 65+) 03/22/2019  . Influenza, High Dose Seasonal PF 05/08/2014, 05/13/2016, 01/26/2017, 02/25/2018  . Influenza,inj,Quad PF,6+ Mos 04/27/2015  . Influenza-Unspecified 03/30/2017  . Pneumococcal Conjugate-13 04/15/2017  . Pneumococcal Polysaccharide-23 07/13/2007, 04/01/2019  . Zoster 12/26/2014     Current Diagnosis/Assessment:  Goals Addressed   None     Diabetes   Recent Relevant Labs: Lab Results  Component Value Date/Time   HGBA1C 5.3 04/01/2019 02:47 PM   HGBA1C 5.6 09/23/2018 08:00 AM   MICROALBUR 32.2 (H) 02/12/2016 09:12 AM   MICROALBUR 53.5 (H) 01/01/2015 12:39 PM     Checking BG: {CHL HP Blood Glucose Monitoring Frequency:[phone]}  Recent FBG Readings: Recent pre-meal BG readings: *** Recent 2hr PP BG readings:  *** Recent HS BG readings: *** Patient has failed these meds in past: *** Patient is currently {CHL Controlled/Uncontrolled:[phone]} on the following medications: aspirin 77m daily, januvia 1061mdaily   Last diabetic Foot exam: No results found for: HMDIABEYEEXA  Last diabetic Eye exam: No results found for: HMDIABFOOTEX   We discussed: {CHL HP Upstream Pharmacy [phone]}  Plan  Continue {CHL HP Upstream Pharmacy Plans:[phone]},  Hypertension   CMP     Component Value Date/Time   NA  138 05/03/2019 0850   NA 137 04/26/2019 1046   K 2.8 (L) 05/03/2019 0850   CL 99 05/03/2019 0850   CO2 28 05/03/2019 0850   GLUCOSE 115 (H) 05/03/2019 0850   BUN 7 (L) 05/03/2019 0850   BUN 11 04/26/2019 1046   CREATININE 0.88 05/03/2019 0850   CREATININE 1.00 (H) 04/01/2019 1447   CALCIUM 9.0 05/03/2019 0850   PROT 6.4 (L) 05/03/2019 0850   PROT 6.7 04/26/2019 1046   ALBUMIN 2.6 (L) 05/03/2019 0850   ALBUMIN 3.7 04/26/2019 1046   AST 15 05/03/2019 0850   ALT 9 05/03/2019 0850   ALKPHOS 51 05/03/2019 0850   BILITOT 0.7 05/03/2019 0850   BILITOT 0.5 04/26/2019 1046   GFRNONAA >60 05/03/2019 0850   GFRAA >60 05/03/2019 0850    BP today is:  {CHL HP UPSTREAM Pharmacist BP ranges:[id number]}  Office blood pressures are  BP Readings from Last 3 Encounters:  05/03/19 (!) 151/84  04/26/19 116/77  04/16/19 (!) 178/82    Patient has failed these meds in the past: ***  Patient is currently {CHL Controlled/Uncontrolled:[phone]} on the following medications: amlodipine 1054maily, metoprolol  succinate 9m daily, clonidine 0.113m Patient checks BP at home {CHL HP BP Monitoring Frequency:[phone]}  Patient home BP readings are ranging: ***  We discussed {CHL HP Upstream Pharmacy discussion:[phone]}  Plan  Continue {CHL HP Upstream Pharmacy Plans:[phone]}    Hyperlipidemia   Lipid Panel     Component Value Date/Time   CHOL 249 (H) 09/23/2018 0800   TRIG 67.0 09/23/2018 0800   HDL 66.20 09/23/2018 0800   CHOLHDL 4 09/23/2018 0800   VLDL 13.4 09/23/2018 0800   LDLCALC 170 (H) 09/23/2018 0800     ASCVD 10-year risk: ***  Patient has failed these meds in past:  Patient is currently {CHL Controlled/Uncontrolled:[phone]} on the following medications: atorvastatin 2021m We discussed:  {CHL HP Upstream Pharmacy discussion:[phone]}  Plan  Continue {CHL HP Upstream Pharmacy Plans:[phone]}  GERD    Patient has failed these meds in past:  *** Patient is currently {CHL Controlled/Uncontrolled:[phone]} on the following medications: esomperazole 21m29mily, famotidine 20mg4me discussed:  {CHL HP Upstream Pharmacy discussion:[phone]}  Plan  Continue {CHL HP Upstream Pharmacy Plans:[phone]}   Osteoarthritis/Neuropathy    Patient has failed these meds in past: *** Patient is currently {CHL Controlled/Uncontrolled:[phone]} on the following medications: meloxicam 7.5mg d27my, methocarbamol 500mg, 85mpentin 300mg   21mdiscussed:  {CHL HP Upstream Pharmacy discussion:[phone]}  Plan  Continue {CHL HP Upstream Pharmacy Plans:[phone]}   Vitamin D Deficiency    Patient has failed these meds in past: *** Patient is currently {CHL Controlled/Uncontrolled:[phone]} on the following medications: cholecalciferol 1000 units  We discussed:  {CHL HP Upstream Pharmacy discussion:[phone]}  Plan  Continue {CHL HP Upstream Pharmacy Plans:[phone]}   Vitamin B12 Deficiency    Patient has failed these meds in past: *** Patient is currently {CHL Controlled/Uncontrolled:[phone]} on the following medications: cyanocobalamin 1000mcg   42miscussed:  {CHL HP Upstream Pharmacy discussion:[phone]}  Plan  Continue {CHL HP Upstream Pharmacy Plans:[phone]}   Miscellaneous Acetazolamide 250mg Mecl15me 25mg Multi34mmin Potassium choloride 21meq/15mL 14mnisone 5mg Terconaz4m 0.8% vaginal cream

## 2019-08-02 NOTE — Telephone Encounter (Signed)
@  LOGO@ Chronic Care Management   Outreach Note  08/02/2019 Name: Meredith Phillips MRN: [id number] DOB: 1945-09-18  Referred by: Esperanza Richters, PA-C Reason for referral : Chronic Care Management  Third unsuccessful telephone outreach was attempted today. The patient was referred to the pharmacist for assistance with care management and care coordination.   Follow Up Plan:  -Reschedule Initial CCM appointment with me. If patient calls, please have them reach out to me for reschedule.  -I will attempt to reschedule patient at the end of the week.    Katrinka Blazing, PharmD Clinical Pharmacist [facility] at [facility] [phone]

## 2019-08-02 NOTE — Chronic Care Management (AMB) (Unsigned)
  Chronic Care Management   Outreach Note  08/02/2019 Name: Meredith Phillips MRN: [id number] DOB: Nov 29, 1945  Referred by: Esperanza Richters, PA-C Reason for referral : Chronic Care Management  Third unsuccessful telephone outreach was attempted today. The patient was referred to the pharmacist for assistance with care management and care coordination.   Follow Up Plan:  -Reschedule Initial CCM appointment with Dannielle Karvonen. If patient calls please have them reach out to me.  -I will attempt to reschedule appt at the end of the week.  Katrinka Blazing, PharmD Clinical Pharmacist [facility] at [facility] [phone]

## 2019-08-09 ENCOUNTER — Other Ambulatory Visit: Payer: Self-pay | Admitting: Medical

## 2019-08-10 DIAGNOSIS — E559 Vitamin D deficiency, unspecified: Secondary | ICD-10-CM | POA: Diagnosis not present

## 2019-08-10 DIAGNOSIS — R5383 Other fatigue: Secondary | ICD-10-CM | POA: Diagnosis not present

## 2019-08-10 DIAGNOSIS — R7303 Prediabetes: Secondary | ICD-10-CM | POA: Diagnosis not present

## 2019-08-10 DIAGNOSIS — E7800 Pure hypercholesterolemia, unspecified: Secondary | ICD-10-CM | POA: Diagnosis not present

## 2019-08-10 DIAGNOSIS — I10 Essential (primary) hypertension: Secondary | ICD-10-CM | POA: Diagnosis not present

## 2019-08-10 DIAGNOSIS — Z1331 Encounter for screening for depression: Secondary | ICD-10-CM | POA: Diagnosis not present

## 2019-08-10 DIAGNOSIS — Z0000 Encounter for general adult medical examination without abnormal findings: Secondary | ICD-10-CM | POA: Diagnosis not present

## 2019-08-10 DIAGNOSIS — N390 Urinary tract infection, site not specified: Secondary | ICD-10-CM | POA: Diagnosis not present

## 2019-08-10 DIAGNOSIS — Z1339 Encounter for screening examination for other mental health and behavioral disorders: Secondary | ICD-10-CM | POA: Diagnosis not present

## 2019-08-10 DIAGNOSIS — K219 Gastro-esophageal reflux disease without esophagitis: Secondary | ICD-10-CM | POA: Diagnosis not present

## 2019-08-16 DIAGNOSIS — D509 Iron deficiency anemia, unspecified: Secondary | ICD-10-CM | POA: Diagnosis not present

## 2019-08-16 DIAGNOSIS — Z01818 Encounter for other preprocedural examination: Secondary | ICD-10-CM | POA: Diagnosis not present

## 2019-08-18 ENCOUNTER — Other Ambulatory Visit: Payer: Self-pay | Admitting: Medical

## 2019-08-26 DIAGNOSIS — Z1211 Encounter for screening for malignant neoplasm of colon: Secondary | ICD-10-CM | POA: Diagnosis not present

## 2019-08-26 DIAGNOSIS — Z01818 Encounter for other preprocedural examination: Secondary | ICD-10-CM | POA: Diagnosis not present

## 2019-08-26 DIAGNOSIS — D509 Iron deficiency anemia, unspecified: Secondary | ICD-10-CM | POA: Diagnosis not present

## 2019-09-01 DIAGNOSIS — H34831 Tributary (branch) retinal vein occlusion, right eye, with macular edema: Secondary | ICD-10-CM | POA: Diagnosis not present

## 2019-09-15 ENCOUNTER — Other Ambulatory Visit: Payer: Self-pay | Admitting: Medical

## 2019-09-15 DIAGNOSIS — I10 Essential (primary) hypertension: Secondary | ICD-10-CM | POA: Diagnosis not present

## 2019-09-15 DIAGNOSIS — Z6823 Body mass index (BMI) 23.0-23.9, adult: Secondary | ICD-10-CM | POA: Diagnosis not present

## 2019-09-15 DIAGNOSIS — K219 Gastro-esophageal reflux disease without esophagitis: Secondary | ICD-10-CM | POA: Diagnosis not present

## 2019-09-15 DIAGNOSIS — Z8639 Personal history of other endocrine, nutritional and metabolic disease: Secondary | ICD-10-CM | POA: Diagnosis not present

## 2019-09-18 ENCOUNTER — Other Ambulatory Visit: Payer: Self-pay | Admitting: Medical

## 2019-09-18 DIAGNOSIS — K2100 Gastro-esophageal reflux disease with esophagitis, without bleeding: Secondary | ICD-10-CM

## 2019-09-21 ENCOUNTER — Telehealth: Payer: Self-pay | Admitting: Pharmacist

## 2019-09-21 NOTE — Telephone Encounter (Signed)
Patient was originally scheduled for a CCM Visit on 08/02/19, but was a no show.  Called patient to get her rescheduled in order to review her medication regimen and answer any medication related questions. Left VM.

## 2019-09-27 DIAGNOSIS — R1084 Generalized abdominal pain: Secondary | ICD-10-CM | POA: Diagnosis not present

## 2019-09-27 DIAGNOSIS — R079 Chest pain, unspecified: Secondary | ICD-10-CM | POA: Diagnosis not present

## 2019-09-27 DIAGNOSIS — K279 Peptic ulcer, site unspecified, unspecified as acute or chronic, without hemorrhage or perforation: Secondary | ICD-10-CM | POA: Diagnosis not present

## 2019-09-27 DIAGNOSIS — D649 Anemia, unspecified: Secondary | ICD-10-CM | POA: Diagnosis not present

## 2019-09-27 DIAGNOSIS — K2990 Gastroduodenitis, unspecified, without bleeding: Secondary | ICD-10-CM | POA: Diagnosis not present

## 2019-09-27 DIAGNOSIS — R0989 Other specified symptoms and signs involving the circulatory and respiratory systems: Secondary | ICD-10-CM | POA: Diagnosis not present

## 2019-09-28 ENCOUNTER — Telehealth: Payer: Self-pay | Admitting: Medical

## 2019-09-28 DIAGNOSIS — R0989 Other specified symptoms and signs involving the circulatory and respiratory systems: Secondary | ICD-10-CM | POA: Diagnosis not present

## 2019-09-28 DIAGNOSIS — R1084 Generalized abdominal pain: Secondary | ICD-10-CM | POA: Diagnosis not present

## 2019-09-28 NOTE — Progress Notes (Signed)
Trying to reschedule Meredith Phillips for her phone visit with CPP Dannielle Karvonen. Patient sent a text and stated they would give me a call back later.      [name] UpStream Scheduler

## 2019-09-30 DIAGNOSIS — R1084 Generalized abdominal pain: Secondary | ICD-10-CM | POA: Diagnosis not present

## 2019-09-30 DIAGNOSIS — Z6823 Body mass index (BMI) 23.0-23.9, adult: Secondary | ICD-10-CM | POA: Diagnosis not present

## 2019-09-30 DIAGNOSIS — N390 Urinary tract infection, site not specified: Secondary | ICD-10-CM | POA: Diagnosis not present

## 2019-09-30 DIAGNOSIS — A499 Bacterial infection, unspecified: Secondary | ICD-10-CM | POA: Diagnosis not present

## 2019-10-03 ENCOUNTER — Telehealth: Payer: Self-pay | Admitting: Medical

## 2019-10-03 NOTE — Progress Notes (Signed)
  Chronic Care Management   Outreach Note  10/03/2019 Name: Meredith Phillips MRN: [id number] DOB: 1945/10/09  Referred by: Esperanza Richters, PA-C Reason for referral : No chief complaint on file.   Third unsuccessful telephone outreach was attempted today. The patient was referred to the pharmacist for assistance with care management and care coordination.   Follow Up Plan:   [name] UpStream Scheduler

## 2019-10-05 DIAGNOSIS — H34831 Tributary (branch) retinal vein occlusion, right eye, with macular edema: Secondary | ICD-10-CM | POA: Diagnosis not present

## 2019-10-06 DIAGNOSIS — R739 Hyperglycemia, unspecified: Secondary | ICD-10-CM | POA: Diagnosis not present

## 2019-10-06 DIAGNOSIS — Z6823 Body mass index (BMI) 23.0-23.9, adult: Secondary | ICD-10-CM | POA: Diagnosis not present

## 2019-10-06 DIAGNOSIS — I10 Essential (primary) hypertension: Secondary | ICD-10-CM | POA: Diagnosis not present

## 2019-10-06 DIAGNOSIS — Z9119 Patient's noncompliance with other medical treatment and regimen: Secondary | ICD-10-CM | POA: Diagnosis not present

## 2019-10-11 DIAGNOSIS — M2142 Flat foot [pes planus] (acquired), left foot: Secondary | ICD-10-CM | POA: Diagnosis not present

## 2019-10-11 DIAGNOSIS — B351 Tinea unguium: Secondary | ICD-10-CM | POA: Diagnosis not present

## 2019-10-11 DIAGNOSIS — E119 Type 2 diabetes mellitus without complications: Secondary | ICD-10-CM | POA: Diagnosis not present

## 2019-10-11 DIAGNOSIS — L600 Ingrowing nail: Secondary | ICD-10-CM | POA: Diagnosis not present

## 2019-10-14 ENCOUNTER — Other Ambulatory Visit: Payer: Self-pay | Admitting: Medical

## 2019-10-26 ENCOUNTER — Telehealth: Payer: Self-pay | Admitting: Medical

## 2019-10-26 NOTE — Progress Notes (Signed)
  Chronic Care Management   Note  10/26/2019 Name: [name] MRN: [id number] DOB: 1945/12/26  [name] is a 74 y.o. year old female who is a primary care patient of Saguier, Kateri Mc. I reached out to Rebekah Chesterfield by phone today in response to a referral sent by Ms. Arcelia Jew Amico's PCP, Saguier, Ramon Dredge, PA-C.   Ms. Lona was given information about Chronic Care Management services today including:  1. CCM service includes personalized support from designated clinical staff supervised by her physician, including individualized plan of care and coordination with other care providers 2. 24/7 contact phone numbers for assistance for urgent and routine care needs. 3. Service will only be billed when office clinical staff spend 20 minutes or more in a month to coordinate care. 4. Only one practitioner may furnish and bill the service in a calendar month. 5. The patient may stop CCM services at any time (effective at the end of the month) by phone call to the office staff.   Patient agreed to services and verbal consent obtained.   This note is not being shared with the patient for the following reason: To respect privacy (The patient or proxy has requested that the information not be shared). Follow up plan:   Lynnae January Upstream Scheduler

## 2019-10-27 ENCOUNTER — Telehealth: Payer: Self-pay | Admitting: Medical

## 2019-10-27 NOTE — Progress Notes (Signed)
PLEASE USE PATIENT CELL PHONE FOR CCM VISIT.  CALL [phone].  This note is not being shared with the patient for the following reason: To respect privacy (The patient or proxy has requested that the information not be shared).   Lynnae January Upstream Scheduler

## 2019-11-02 ENCOUNTER — Other Ambulatory Visit: Payer: Self-pay | Admitting: Medical

## 2019-11-08 DIAGNOSIS — L0202 Furuncle of face: Secondary | ICD-10-CM | POA: Diagnosis not present

## 2019-11-10 DIAGNOSIS — H34831 Tributary (branch) retinal vein occlusion, right eye, with macular edema: Secondary | ICD-10-CM | POA: Diagnosis not present

## 2019-11-11 DIAGNOSIS — R3989 Other symptoms and signs involving the genitourinary system: Secondary | ICD-10-CM | POA: Diagnosis not present

## 2019-11-11 DIAGNOSIS — Z01812 Encounter for preprocedural laboratory examination: Secondary | ICD-10-CM | POA: Diagnosis not present

## 2019-11-17 DIAGNOSIS — Z7189 Other specified counseling: Secondary | ICD-10-CM | POA: Diagnosis not present

## 2019-11-17 DIAGNOSIS — R42 Dizziness and giddiness: Secondary | ICD-10-CM | POA: Diagnosis not present

## 2019-11-17 DIAGNOSIS — Z6822 Body mass index (BMI) 22.0-22.9, adult: Secondary | ICD-10-CM | POA: Diagnosis not present

## 2019-11-17 DIAGNOSIS — L989 Disorder of the skin and subcutaneous tissue, unspecified: Secondary | ICD-10-CM | POA: Diagnosis not present

## 2019-11-18 DIAGNOSIS — I252 Old myocardial infarction: Secondary | ICD-10-CM | POA: Diagnosis not present

## 2019-11-18 DIAGNOSIS — R55 Syncope and collapse: Secondary | ICD-10-CM | POA: Diagnosis not present

## 2019-11-18 DIAGNOSIS — I459 Conduction disorder, unspecified: Secondary | ICD-10-CM | POA: Diagnosis not present

## 2019-11-18 DIAGNOSIS — R9431 Abnormal electrocardiogram [ECG] [EKG]: Secondary | ICD-10-CM | POA: Diagnosis not present

## 2019-11-20 ENCOUNTER — Other Ambulatory Visit: Payer: Self-pay | Admitting: Medical

## 2019-11-21 DIAGNOSIS — R42 Dizziness and giddiness: Secondary | ICD-10-CM | POA: Diagnosis not present

## 2019-11-23 ENCOUNTER — Telehealth: Payer: Self-pay | Admitting: Medical

## 2019-11-23 NOTE — Telephone Encounter (Signed)
Patient called stated she was returning a call. The call was for Meredith Phillips letting her know she needed a follow up appointment for further refills. She states that she no longer comes here and now goes over to [facility] for Health care. She stated she had a bad experience and states she didn't want to return to see Ramon Dredge.

## 2019-11-23 NOTE — Telephone Encounter (Signed)
LM requesting call back to discuss Meloxicam refill. Patient needs F/U appt with PCP scheduled, refilled x 30 days only.

## 2019-11-27 ENCOUNTER — Other Ambulatory Visit: Payer: Self-pay | Admitting: Medical

## 2019-12-12 ENCOUNTER — Ambulatory Visit: Payer: Self-pay | Admitting: Pharmacist

## 2019-12-12 ENCOUNTER — Telehealth: Payer: PPO

## 2019-12-12 DIAGNOSIS — E119 Type 2 diabetes mellitus without complications: Secondary | ICD-10-CM | POA: Diagnosis not present

## 2019-12-12 DIAGNOSIS — B351 Tinea unguium: Secondary | ICD-10-CM | POA: Diagnosis not present

## 2019-12-12 DIAGNOSIS — M2142 Flat foot [pes planus] (acquired), left foot: Secondary | ICD-10-CM | POA: Diagnosis not present

## 2019-12-12 DIAGNOSIS — L600 Ingrowing nail: Secondary | ICD-10-CM | POA: Diagnosis not present

## 2019-12-12 NOTE — Chronic Care Management (AMB) (Deleted)
Chronic Care Management Pharmacy  Name: Meredith Phillips  MRN: [id number] DOB: 03-21-1946   Chief Complaint/ HPI  Meredith Phillips,  74 y.o. , female presents for their Initial CCM visit with the clinical pharmacist via telephone due to COVID-19 Pandemic.  PCP : Mackie Pai, PA-C  Their chronic conditions include: Hypertension, Hyperlipidemia, Diabetes, GERD, Depression, Anxiety and Osteoarthritis   Office Visits: 04/01/19: Patient presented to Dr. Mackie Pai for frequent urination. Urine culture showed probable flora growth, but no med changes made.  Consult Visit: 10/11/19: Patient presented to Dr. Filomena Jungling (Podiatry) 10/06/19: Patient presented to Dr. Arvella Nigh (Internal Med) for HTN  10/05/19: Patient presented to Dr. Randell Loop (ophthalmology)  09/30/19: Patient presented to Dr. Percell Miller (General Practice) for abdominal pain 09/27/19: Patient presented to Dr. Alan Ripper (GI) for anemia 09/15/19: Patient presented to Dr. Arvella Nigh (Internal Med) for GERD 08/26/19: Patient received colonoscopy + endoscopy 07/28/19: Patient presented to Dr. Arvella Nigh (Internal Med) for Ulcerative Colitis. 07/26/19: Patient presented to Dr. Arvella Nigh (Internal Med) for Thyroid 07/22/19: Patient presented to Dr. Arvella Nigh (Internal Med) for suspected Covid infection 07/12/19: Patient presented to Dr. Arvella Nigh (Internal Med) for UTI   Allergies  Allergen Reactions  . Codeine Other (See Comments)    Woke up the next morning and did not know where she was  . Januvia [Sitagliptin] Itching  . Other     Doesn't like to take pain medication due to hallucinations.  . Shellfish Allergy     Medications: Outpatient Encounter Medications as of 12/12/2019  Medication Sig Note  . acetaZOLAMIDE (DIAMOX) 250 MG tablet TAKE 1 TABLET BY MOUTH TWICE A Landers Prajapati (Patient taking differently: Take 250 mg by mouth 2 (two) times daily. ) 05/03/2019: 15 Haseeb Fiallos supply Last filled 03/17/2019  . amLODipine  (NORVASC) 10 MG tablet TAKE 1 TABLET BY MOUTH EVERY Dallas Torok   . aspirin EC 81 MG tablet Take 81 mg by mouth daily.   [name] atorvastatin (LIPITOR) 20 MG tablet TAKE 1 TABLET BY MOUTH EVERY Alyvia Derk   . blood glucose meter kit and supplies Dispense based on patient and insurance preference. Use up to four times daily as directed. (FOR ICD-10 E10.9, E11.9).   [name] Blood Glucose Monitoring Suppl (ONE TOUCH ULTRA 2) w/Device KIT Use to check blood sugars twice a Brittni Hult Dx E11.9   . Cholecalciferol (VITAMIN D-3) 1000 UNITS CAPS Take 1,000 Units by mouth.   . cloNIDine (CATAPRES) 0.1 MG tablet Take 1 tablet (0.1 mg total) by mouth 2 (two) times daily. 05/03/2019: 90 Jodey Burbano supply Last filled 02/06/2019  . cyanocobalamin 1000 MCG tablet Take 1,000 mcg by mouth daily.   [name] esomeprazole (NEXIUM) 40 MG capsule TAKE 1 CAPSULE (40 MG TOTAL) BY MOUTH DAILY AT 12 NOON.   . famotidine (PEPCID) 20 MG tablet TAKE 1 TABLET BY MOUTH TWICE A Davelle Anselmi (Patient not taking: Reported on 05/03/2019)   . FREESTYLE LITE test strip USE AS DIRECTED UP TO 4 TIMES DAILY   . gabapentin (NEURONTIN) 300 MG capsule Take 1 capsule (300 mg total) by mouth 3 (three) times daily. TAKE 1 CAPSULE BY MOUTH THREE TIMES A Said Rueb (Patient not taking: Reported on 05/03/2019)   . JANUVIA 100 MG tablet TAKE 1 TABLET BY MOUTH EVERY Alexei Ey (Patient taking differently: Take 100 mg by mouth daily. ) 05/03/2019: 90 Mischell Branford supply Last filled 04/22/2019  . Lancets (ONETOUCH ULTRASOFT) lancets USE TO HELP CHECK BLOOD SUGARS TWICE A Ladarion Munyon E11.9   . meclizine (ANTIVERT) 25 MG tablet TAKE 1/2 TO 1 TABLET BY  MOUTH TWICE A Tenika Keeran AS NEEDED FOR DIZZINESS   . meloxicam (MOBIC) 7.5 MG tablet TAKE 1 TABLET BY MOUTH EVERY Breia Ocampo   . methocarbamol (ROBAXIN) 500 MG tablet Take 1 tablet (500 mg total) by mouth 2 (two) times daily.   . metoprolol succinate (TOPROL-XL) 25 MG 24 hr tablet Take 1 tablet (25 mg total) by mouth daily. 05/03/2019: 90 Derinda Bartus supply Last filled 04/30/2019  . Multiple Vitamin (MULTIVITAMIN)  tablet Take 1 tablet by mouth daily.   . Potassium Chloride 40 MEQ/15ML (20%) SOLN Take 40 mg by mouth daily for 7 days.   . predniSONE (DELTASONE) 5 MG tablet Take 6 pills for first Prairie Stenberg, 5 pills second Kapil Petropoulos, 4 pills third Shloime Keilman, 3 pills fourth Betsey Sossamon, 2 pills the fifth Ikea Demicco, and 1 pill sixth Tifanny Dollens.   . terconazole (TERAZOL 3) 0.8 % vaginal cream Place 1 applicator vaginally at bedtime.    No facility-administered encounter medications on file as of 12/12/2019.     Current Diagnosis/Assessment:     Goals Addressed   None    Diabetes   A1c goal <7%  Recent Relevant Labs: Lab Results  Component Value Date/Time   HGBA1C 5.3 04/01/2019 02:47 PM   HGBA1C 5.6 09/23/2018 08:00 AM   GFR 71.01 01/18/2019 10:37 AM   GFR 56.75 (L) 10/21/2018 01:22 PM   MICROALBUR 32.2 (H) 02/12/2016 09:12 AM   MICROALBUR 53.5 (H) 01/01/2015 12:39 PM    Last diabetic Eye exam: No results found for: HMDIABEYEEXA  Last diabetic Foot exam: No results found for: HMDIABFOOTEX   Checking BG: {CHL HP Blood Glucose Monitoring [phone]}  Recent FBG Readings: *** Recent pre-meal BG readings: *** Recent 2hr PP BG readings:  *** Recent HS BG readings: ***  Patient has failed these meds in past: Jardiance (urinary complaints), metformin XR Patient is currently {CHL Controlled/[phone]} on the following medications: . Januvia 100 mg daily   Taking Januvia? Allergic??  We discussed: {CHL HP Upstream Pharmacy [phone]}  Plan  Continue {CHL HP Upstream Pharmacy Plans:[phone]}  Hypertension   BP goal is:  <140/90  Office blood pressures are  BP Readings from Last 3 Encounters:  05/03/19 (!) 151/84  04/26/19 116/77  04/16/19 (!) 178/82   Patient checks BP at home {CHL HP BP Monitoring [phone]} Patient home BP readings are ranging: ***  Patient has failed these meds in the past: Carvedilol, HCTZ, losartan Patient is currently uncontrolled on the  following medications:  . Amlodipine 10 mg daily  . Clonidine 0.1 mg BID  . Metoprolol Succinate 25 mg daily   We discussed {CHL HP Upstream Pharmacy [phone]}  Plan  Continue {CHL HP Upstream Pharmacy Plans:[phone]}   Hyperlipidemia   LDL goal < 100  Lipid Panel     Component Value Date/Time   CHOL 249 (H) 09/23/2018 0800   TRIG 67.0 09/23/2018 0800   HDL 66.20 09/23/2018 0800   LDLCALC 170 (H) 09/23/2018 0800    Hepatic Function Latest Ref Rng & Units 05/03/2019 04/26/2019 04/01/2019  Total Protein 6.5 - 8.1 g/dL 6.4(L) 6.7 7.7  Albumin 3.5 - 5.0 g/dL 2.6(L) 3.7 -  AST 15 - 41 U/L _0 ALT 0 - 44 U/L _1 Alk Phosphatase 38 - 126 U/L 51 57 -  Total Bilirubin 0.3 - 1.2 mg/dL 0.7 0.5 0.3     The 10-year ASCVD risk score Mikey Bussing DC Jr., et al., 2013) is: 50.9%   Values used to calculate the score:  Age: 80 years     Sex: Female     Is Non-Hispanic African American: Yes     Diabetic: Yes     Tobacco smoker: No     Systolic Blood Pressure: 715 mmHg     Is BP treated: Yes     HDL Cholesterol: 66.2 mg/dL     Total Cholesterol: 249 mg/dL   Patient has failed these meds in past: Simvastatin Patient is currently {CHL Controlled/Uncontrolled:([phone]} on the following medications:  . Aspirin 81 mg daily . Atorvastatin 20 mg daily   We discussed:  {CHL HP Upstream Pharmacy [phone]}  Plan  Continue {CHL HP Upstream Pharmacy [phone]}   GERD   Patient has failed these meds in past: *** Patient is currently {CHL Controlled/Uncontrolled:([phone]} on the following medications:  . Esomeprazole 40 mg daily  . Famotidine 20 mg BID   We discussed:  ***  Plan  Continue {CHL HP Upstream Pharmacy [phone]}  Chronic Pain   Osteoarthritis  Patient has failed these meds in past: *** Patient is currently {CHL Controlled/Uncontrolled:([phone]} on the following medications:  . Gabapentin 300 mg TID   . Meloxicam 7.5 mg daily  . Methocarbamol 500 mg BID    We discussed:  ***  Plan  Continue {CHL HP Upstream Pharmacy [phone]}   Misc / OTC    Patient has failed these meds in past: *** Patient is currently {CHL Controlled/Uncontrolled:([phone]} on the following medications:  . Acetazolamide 250 mg BID  . Vitamin D3 1000 units daily  . Vitamin B12 1000 mcg daily  . Meclizine 25 mg 1/2-1 tab BID  . Multivitamin daily  . Potassium chloride 40MeQ/51m . Prednisone 5 mg dosepack . Teronazole 0.8% vaginal cream QHS   We discussed:  ***  Plan  Continue {CHL HP Upstream Pharmacy PNBZXY:[id number]} Vaccines   Reviewed and discussed patient's vaccination history.    Immunization History  Administered Date(s) Administered  . Fluad Quad(high Dose 65+) 03/22/2019  . Influenza, High Dose Seasonal PF 05/08/2014, 05/13/2016, 01/26/2017, 02/25/2018  . Influenza,inj,Quad PF,6+ Mos 04/27/2015  . Influenza-Unspecified 03/30/2017  . Pneumococcal Conjugate-13 04/15/2017  . Pneumococcal Polysaccharide-23 07/13/2007, 04/01/2019  . Zoster 12/26/2014    Plan  Recommended patient receive Shingrix vaccine in pharmacy .   Medication Management   Pt uses [facility] for all medications Uses pill box? {Yes or If no, why not?:20788} Pt endorses ***% compliance  We discussed: ***  Plan  {US Pharmacy PHJSC:38377}   Follow up: *** month phone visit  ***

## 2019-12-12 NOTE — Chronic Care Management (AMB) (Signed)
  Chronic Care Management   Outreach Note  12/12/2019 Name: Meredith Phillips MRN: [id number] DOB: 10/04/1945  Referred by: Esperanza Richters, PA-C Reason for referral : No chief complaint on file.   A second unsuccessful telephone outreach was attempted today. The patient was referred to pharmacist for assistance with care management and care coordination.   Called at 10:03am (home and left VM), 10:05am (cell), and 10:17am (home)  Follow Up Plan:  -Unsure if patient still following at [facility]. Will attempt to reach out again before the end of the month to confirm.  Katrinka Blazing, PharmD Clinical Pharmacist [facility] at [facility] [phone]

## 2019-12-12 NOTE — Chronic Care Management (AMB) (Deleted)
Chronic Care Management Pharmacy  Name: ALEXAS BASULTO  MRN: [id number] DOB: January 31, 1946   Chief Complaint/ HPI  Janelly Switalski Knoth,  74 y.o. , female presents for their Initial CCM visit with the clinical pharmacist via telephone due to COVID-19 Pandemic.  PCP : Mackie Pai, PA-C  Their chronic conditions include: Hypertension, Hyperlipidemia, Diabetes, GERD, Depression, Anxiety and Osteoarthritis   Office Visits: 04/01/19: Patient presented to Dr. Mackie Pai for frequent urination. Urine culture showed probable flora growth, but no med changes made.  Consult Visit: 10/11/19: Patient presented to Dr. Filomena Jungling (Podiatry) 10/06/19: Patient presented to Dr. Arvella Nigh (Internal Med) for HTN  10/05/19: Patient presented to Dr. Randell Loop (ophthalmology)  09/30/19: Patient presented to Dr. Percell Miller (General Practice) for abdominal pain 09/27/19: Patient presented to Dr. Alan Ripper (GI) for anemia 09/15/19: Patient presented to Dr. Arvella Nigh (Internal Med) for GERD 08/26/19: Patient received colonoscopy + endoscopy 07/28/19: Patient presented to Dr. Arvella Nigh (Internal Med) for Ulcerative Colitis. 07/26/19: Patient presented to Dr. Arvella Nigh (Internal Med) for Thyroid 07/22/19: Patient presented to Dr. Arvella Nigh (Internal Med) for suspected Covid infection 07/12/19: Patient presented to Dr. Arvella Nigh (Internal Med) for UTI   Allergies  Allergen Reactions   Codeine Other (See Comments)    Woke up the next morning and did not know where she was   Januvia [Sitagliptin] Itching   Other     Doesn't like to take pain medication due to hallucinations.   Shellfish Allergy     Medications: Outpatient Encounter Medications as of 12/12/2019  Medication Sig Note   acetaZOLAMIDE (DIAMOX) 250 MG tablet TAKE 1 TABLET BY MOUTH TWICE A DAY (Patient taking differently: Take 250 mg by mouth 2 (two) times daily. ) 05/03/2019: 15 day supply Last filled 03/17/2019   amLODipine  (NORVASC) 10 MG tablet TAKE 1 TABLET BY MOUTH EVERY DAY    aspirin EC 81 MG tablet Take 81 mg by mouth daily.    atorvastatin (LIPITOR) 20 MG tablet TAKE 1 TABLET BY MOUTH EVERY DAY    blood glucose meter kit and supplies Dispense based on patient and insurance preference. Use up to four times daily as directed. (FOR ICD-10 E10.9, E11.9).    Blood Glucose Monitoring Suppl (ONE TOUCH ULTRA 2) w/Device KIT Use to check blood sugars twice a day Dx E11.9    Cholecalciferol (VITAMIN D-3) 1000 UNITS CAPS Take 1,000 Units by mouth.    cloNIDine (CATAPRES) 0.1 MG tablet Take 1 tablet (0.1 mg total) by mouth 2 (two) times daily. 05/03/2019: 90 day supply Last filled 02/06/2019   cyanocobalamin 1000 MCG tablet Take 1,000 mcg by mouth daily.    esomeprazole (NEXIUM) 40 MG capsule TAKE 1 CAPSULE (40 MG TOTAL) BY MOUTH DAILY AT 12 NOON.    famotidine (PEPCID) 20 MG tablet TAKE 1 TABLET BY MOUTH TWICE A DAY (Patient not taking: Reported on 05/03/2019)    FREESTYLE LITE test strip USE AS DIRECTED UP TO 4 TIMES DAILY    gabapentin (NEURONTIN) 300 MG capsule Take 1 capsule (300 mg total) by mouth 3 (three) times daily. TAKE 1 CAPSULE BY MOUTH THREE TIMES A DAY (Patient not taking: Reported on 05/03/2019)    JANUVIA 100 MG tablet TAKE 1 TABLET BY MOUTH EVERY DAY (Patient taking differently: Take 100 mg by mouth daily. ) 05/03/2019: 90 day supply Last filled 04/22/2019   Lancets (ONETOUCH ULTRASOFT) lancets USE TO HELP CHECK BLOOD SUGARS TWICE A DAY E11.9    meclizine (ANTIVERT) 25 MG tablet TAKE 1/2 TO 1 TABLET BY  MOUTH TWICE A DAY AS NEEDED FOR DIZZINESS    meloxicam (MOBIC) 7.5 MG tablet TAKE 1 TABLET BY MOUTH EVERY DAY    methocarbamol (ROBAXIN) 500 MG tablet Take 1 tablet (500 mg total) by mouth 2 (two) times daily.    metoprolol succinate (TOPROL-XL) 25 MG 24 hr tablet Take 1 tablet (25 mg total) by mouth daily. 05/03/2019: 90 day supply Last filled 04/30/2019   Multiple Vitamin (MULTIVITAMIN)  tablet Take 1 tablet by mouth daily.    Potassium Chloride 40 MEQ/15ML (20%) SOLN Take 40 mg by mouth daily for 7 days.    predniSONE (DELTASONE) 5 MG tablet Take 6 pills for first day, 5 pills second day, 4 pills third day, 3 pills fourth day, 2 pills the fifth day, and 1 pill sixth day.    terconazole (TERAZOL 3) 0.8 % vaginal cream Place 1 applicator vaginally at bedtime.    No facility-administered encounter medications on file as of 12/12/2019.     Current Diagnosis/Assessment:     Goals Addressed   None    Diabetes   A1c goal {A1c goals:23924}  Recent Relevant Labs: Lab Results  Component Value Date/Time   HGBA1C 5.3 04/01/2019 02:47 PM   HGBA1C 5.6 09/23/2018 08:00 AM   GFR 71.01 01/18/2019 10:37 AM   GFR 56.75 (L) 10/21/2018 01:22 PM   MICROALBUR 32.2 (H) 02/12/2016 09:12 AM   MICROALBUR 53.5 (H) 01/01/2015 12:39 PM    Last diabetic Eye exam: No results found for: HMDIABEYEEXA  Last diabetic Foot exam: No results found for: HMDIABFOOTEX   Checking BG: {CHL HP Blood Glucose Monitoring Frequency:2768419529}  Recent FBG Readings: *** Recent pre-meal BG readings: *** Recent 2hr PP BG readings:  *** Recent HS BG readings: ***  Patient has failed these meds in past: Jardiance, metformin XR Patient is currently {CHL Controlled/Uncontrolled:4057276366} on the following medications:  Januvia 100 mg daily   We discussed: {CHL HP Upstream Pharmacy discussion:[phone]}  Plan  Continue {CHL HP Upstream Pharmacy Plans:5408576364}  Hypertension   BP goal is:  {CHL HP UPSTREAM Pharmacist BP ranges:[phone]}  Office blood pressures are  BP Readings from Last 3 Encounters:  05/03/19 (!) 151/84  04/26/19 116/77  04/16/19 (!) 178/82   Patient checks BP at home {CHL HP BP Monitoring Frequency:[phone]} Patient home BP readings are ranging: ***  Patient has failed these meds in the past: Carvedilol, HCTZ, losartan Patient is currently {CHL  Controlled/Uncontrolled:4057276366} on the following medications:   Amlodipine 10 mg daily   Clonidine 0.1 mg BID   Metoprolol Succinate 25 mg daily   We discussed {CHL HP Upstream Pharmacy discussion:[phone]}  Plan  Continue {CHL HP Upstream Pharmacy Plans:5408576364}   Hyperlipidemia   LDL goal < ***  Lipid Panel     Component Value Date/Time   CHOL 249 (H) 09/23/2018 0800   TRIG 67.0 09/23/2018 0800   HDL 66.20 09/23/2018 0800   LDLCALC 170 (H) 09/23/2018 0800    Hepatic Function Latest Ref Rng & Units 05/03/2019 04/26/2019 04/01/2019  Total Protein 6.5 - 8.1 g/dL 6.4(L) 6.7 7.7  Albumin 3.5 - 5.0 g/dL 2.6(L) 3.7 -  AST 15 - 41 U/L '15 20 10  ' ALT 0 - 44 U/L '9 10 8  ' Alk Phosphatase 38 - 126 U/L 51 57 -  Total Bilirubin 0.3 - 1.2 mg/dL 0.7 0.5 0.3     The 10-year ASCVD risk score Mikey Bussing DC Jr., et al., 2013) is: 50.9%   Values used to calculate the score:  Age: 44 years     Sex: Female     Is Non-Hispanic African American: Yes     Diabetic: Yes     Tobacco smoker: No     Systolic Blood Pressure: 707 mmHg     Is BP treated: Yes     HDL Cholesterol: 66.2 mg/dL     Total Cholesterol: 249 mg/dL   Patient has failed these meds in past: Simvastatin Patient is currently {CHL Controlled/[phone]} on the following medications:   Aspirin 81 mg daily  Atorvastatin 20 mg daily   We discussed:  {CHL HP Upstream Pharmacy discussion:[phone]}  Plan  Continue {CHL HP Upstream Pharmacy Plans:[phone]}   GERD   Patient has failed these meds in past: *** Patient is currently {CHL Controlled/[phone]} on the following medications:   Esomeprazole 40 mg daily   Famotidine 20 mg BID   We discussed:  ***  Plan  Continue {CHL HP Upstream Pharmacy Plans:[phone]}  Chronic Pain   Osteoarthritis  Patient has failed these meds in past: *** Patient is currently {CHL Controlled/[phone]} on the following  medications:   Gabapentin 300 mg TID   Meloxicam 7.5 mg daily   Methocarbamol 500 mg BID    We discussed:  ***  Plan  Continue {CHL HP Upstream Pharmacy Plans:[phone]}   Misc / OTC    Patient has failed these meds in past: *** Patient is currently {CHL Controlled/[phone]} on the following medications:   Acetazolamide 250 mg BID   Vitamin D3 1000 units daily   Vitamin B12 1000 mcg daily   Meclizine 25 mg 1/2-1 tab BID   Multivitamin daily   Potassium chloride 40MeQ/43m  Prednisone 5 mg dosepack  Teronazole 0.8% vaginal cream QHS   We discussed:  ***  Plan  Continue {CHL HP Upstream Pharmacy PAJHHI:[id number]} Vaccines   Reviewed and discussed patient's vaccination history.    Immunization History  Administered Date(s) Administered   Fluad Quad(high Dose 65+) 03/22/2019   Influenza, High Dose Seasonal PF 05/08/2014, 05/13/2016, 01/26/2017, 02/25/2018   Influenza,inj,Quad PF,6+ Mos 04/27/2015   Influenza-Unspecified 03/30/2017   Pneumococcal Conjugate-13 04/15/2017   Pneumococcal Polysaccharide-23 07/13/2007, 04/01/2019   Zoster 12/26/2014    Plan  Recommended patient receive *** vaccine in *** office.   Medication Management   Pt uses [facility] for all medications Uses pill box? {Yes or If no, why not?:20788} Pt endorses ***% compliance  We discussed: ***  Plan  {US Pharmacy POERQ:41282}   Follow up: *** month phone visit  ***

## 2019-12-13 DIAGNOSIS — N1832 Chronic kidney disease, stage 3b: Secondary | ICD-10-CM | POA: Diagnosis not present

## 2019-12-14 DIAGNOSIS — E663 Overweight: Secondary | ICD-10-CM | POA: Diagnosis not present

## 2019-12-14 DIAGNOSIS — G8929 Other chronic pain: Secondary | ICD-10-CM | POA: Diagnosis not present

## 2019-12-14 DIAGNOSIS — Z6828 Body mass index (BMI) 28.0-28.9, adult: Secondary | ICD-10-CM | POA: Diagnosis not present

## 2019-12-14 DIAGNOSIS — Z79899 Other long term (current) drug therapy: Secondary | ICD-10-CM | POA: Diagnosis not present

## 2019-12-14 DIAGNOSIS — N390 Urinary tract infection, site not specified: Secondary | ICD-10-CM | POA: Diagnosis not present

## 2019-12-14 DIAGNOSIS — R350 Frequency of micturition: Secondary | ICD-10-CM | POA: Diagnosis not present

## 2019-12-14 DIAGNOSIS — Z0000 Encounter for general adult medical examination without abnormal findings: Secondary | ICD-10-CM | POA: Diagnosis not present

## 2019-12-14 DIAGNOSIS — G4700 Insomnia, unspecified: Secondary | ICD-10-CM | POA: Diagnosis not present

## 2019-12-14 DIAGNOSIS — Z008 Encounter for other general examination: Secondary | ICD-10-CM | POA: Diagnosis not present

## 2019-12-14 DIAGNOSIS — I10 Essential (primary) hypertension: Secondary | ICD-10-CM | POA: Diagnosis not present

## 2019-12-15 ENCOUNTER — Telehealth: Payer: Self-pay | Admitting: Medical

## 2019-12-15 NOTE — Progress Notes (Signed)
°  Chronic Care Management   Outreach Note  12/15/2019 Name: Meredith Phillips MRN: [id number] DOB: 11-22-1945  Referred by: Esperanza Richters, PA-C Reason for referral : No chief complaint on file.   An unsuccessful telephone outreach was attempted today. The patient was referred to the pharmacist for assistance with care management and care coordination. This note is not being shared with the patient for the following reason: To respect privacy (The patient or proxy has requested that the information not be shared).  Follow Up Plan:   Lynnae January Upstream Scheduler

## 2019-12-20 ENCOUNTER — Other Ambulatory Visit: Payer: Self-pay | Admitting: Medical

## 2019-12-22 DIAGNOSIS — H34831 Tributary (branch) retinal vein occlusion, right eye, with macular edema: Secondary | ICD-10-CM | POA: Diagnosis not present

## 2019-12-22 DIAGNOSIS — E119 Type 2 diabetes mellitus without complications: Secondary | ICD-10-CM | POA: Diagnosis not present

## 2019-12-22 DIAGNOSIS — H3581 Retinal edema: Secondary | ICD-10-CM | POA: Diagnosis not present

## 2019-12-22 DIAGNOSIS — H43822 Vitreomacular adhesion, left eye: Secondary | ICD-10-CM | POA: Diagnosis not present

## 2019-12-29 DIAGNOSIS — Z7189 Other specified counseling: Secondary | ICD-10-CM | POA: Diagnosis not present

## 2019-12-29 DIAGNOSIS — Z760 Encounter for issue of repeat prescription: Secondary | ICD-10-CM | POA: Diagnosis not present

## 2019-12-29 DIAGNOSIS — L989 Disorder of the skin and subcutaneous tissue, unspecified: Secondary | ICD-10-CM | POA: Diagnosis not present

## 2019-12-29 DIAGNOSIS — E119 Type 2 diabetes mellitus without complications: Secondary | ICD-10-CM | POA: Diagnosis not present

## 2020-01-04 NOTE — Progress Notes (Signed)
Reviewed chronic care note.  I have reviewed the care management provider regarding care management and care coordination activities outlined in this encounter and have reviewed this encounter including documentation in the note and care plan. I am certifying that I agree with the content of this note and encounter as supervising PA-C  Esperanza Richters, PA-C

## 2020-01-05 DIAGNOSIS — E559 Vitamin D deficiency, unspecified: Secondary | ICD-10-CM | POA: Diagnosis not present

## 2020-01-05 DIAGNOSIS — Z79899 Other long term (current) drug therapy: Secondary | ICD-10-CM | POA: Diagnosis not present

## 2020-01-05 DIAGNOSIS — Z1152 Encounter for screening for COVID-19: Secondary | ICD-10-CM | POA: Diagnosis not present

## 2020-01-05 DIAGNOSIS — M79644 Pain in right finger(s): Secondary | ICD-10-CM | POA: Diagnosis not present

## 2020-01-05 DIAGNOSIS — E1165 Type 2 diabetes mellitus with hyperglycemia: Secondary | ICD-10-CM | POA: Diagnosis not present

## 2020-01-05 DIAGNOSIS — E7800 Pure hypercholesterolemia, unspecified: Secondary | ICD-10-CM | POA: Diagnosis not present

## 2020-01-12 DIAGNOSIS — K5909 Other constipation: Secondary | ICD-10-CM | POA: Diagnosis not present

## 2020-01-12 DIAGNOSIS — R1084 Generalized abdominal pain: Secondary | ICD-10-CM | POA: Diagnosis not present

## 2020-01-24 DIAGNOSIS — E1169 Type 2 diabetes mellitus with other specified complication: Secondary | ICD-10-CM | POA: Diagnosis not present

## 2020-01-24 DIAGNOSIS — Z0001 Encounter for general adult medical examination with abnormal findings: Secondary | ICD-10-CM | POA: Diagnosis not present

## 2020-01-24 DIAGNOSIS — Z008 Encounter for other general examination: Secondary | ICD-10-CM | POA: Diagnosis not present

## 2020-01-24 DIAGNOSIS — E663 Overweight: Secondary | ICD-10-CM | POA: Diagnosis not present

## 2020-01-24 DIAGNOSIS — H409 Unspecified glaucoma: Secondary | ICD-10-CM | POA: Diagnosis not present

## 2020-01-24 DIAGNOSIS — F0390 Unspecified dementia without behavioral disturbance: Secondary | ICD-10-CM | POA: Diagnosis not present

## 2020-01-24 DIAGNOSIS — E782 Mixed hyperlipidemia: Secondary | ICD-10-CM | POA: Diagnosis not present

## 2020-01-24 DIAGNOSIS — E119 Type 2 diabetes mellitus without complications: Secondary | ICD-10-CM | POA: Diagnosis not present

## 2020-01-24 DIAGNOSIS — K219 Gastro-esophageal reflux disease without esophagitis: Secondary | ICD-10-CM | POA: Diagnosis not present

## 2020-01-24 DIAGNOSIS — Z79899 Other long term (current) drug therapy: Secondary | ICD-10-CM | POA: Diagnosis not present

## 2020-01-24 DIAGNOSIS — I10 Essential (primary) hypertension: Secondary | ICD-10-CM | POA: Diagnosis not present

## 2020-01-24 DIAGNOSIS — F331 Major depressive disorder, recurrent, moderate: Secondary | ICD-10-CM | POA: Diagnosis not present

## 2020-02-08 DIAGNOSIS — H34831 Tributary (branch) retinal vein occlusion, right eye, with macular edema: Secondary | ICD-10-CM | POA: Diagnosis not present

## 2020-02-09 NOTE — Progress Notes (Deleted)
Subjective:   Meredith Phillips is a 74 y.o. female who presents for Medicare Annual (Subsequent) preventive examination.  Review of Systems    ***       Objective:    There were no vitals filed for this visit. There is no height or weight on file to calculate BMI.  Advanced Directives 05/03/2019 04/16/2019 03/03/2019 12/27/2018 05/10/2018 03/26/2018 03/25/2018  Does Patient Have a Medical Advance Directive? Unable to assess, patient is non-responsive or altered mental status Unable to assess, patient is non-responsive or altered mental status No No No - No  Type of Advance Directive - - - - - - -  Does patient want to make changes to medical advance directive? - - - - - - -  Copy of [place] in Chart? - - - - - - -  Would patient like information on creating a medical advance directive? - - No - Patient declined - - Yes (Inpatient - patient defers creating a medical advance directive at this time) -    Current Medications (verified) Outpatient Encounter Medications as of 02/10/2020  Medication Sig  . acetaZOLAMIDE (DIAMOX) 250 MG tablet TAKE 1 TABLET BY MOUTH TWICE A DAY (Patient taking differently: Take 250 mg by mouth 2 (two) times daily. )  . amLODipine (NORVASC) 10 MG tablet TAKE 1 TABLET BY MOUTH EVERY DAY  . aspirin EC 81 MG tablet Take 81 mg by mouth daily.  [name] atorvastatin (LIPITOR) 20 MG tablet TAKE 1 TABLET BY MOUTH EVERY DAY  . blood glucose meter kit and supplies Dispense based on patient and insurance preference. Use up to four times daily as directed. (FOR ICD-10 E10.9, E11.9).  [name] Blood Glucose Monitoring Suppl (ONE TOUCH ULTRA 2) w/Device KIT Use to check blood sugars twice a day Dx E11.9  . Cholecalciferol (VITAMIN D-3) 1000 UNITS CAPS Take 1,000 Units by mouth.  . cloNIDine (CATAPRES) 0.1 MG tablet Take 1 tablet (0.1 mg total) by mouth 2 (two) times daily.  . cyanocobalamin 1000 MCG tablet Take 1,000 mcg by mouth daily.  [name] esomeprazole (NEXIUM) 40  MG capsule TAKE 1 CAPSULE (40 MG TOTAL) BY MOUTH DAILY AT 12 NOON.  . famotidine (PEPCID) 20 MG tablet TAKE 1 TABLET BY MOUTH TWICE A DAY (Patient not taking: Reported on 05/03/2019)  . FREESTYLE LITE test strip USE AS DIRECTED UP TO 4 TIMES DAILY  . gabapentin (NEURONTIN) 300 MG capsule Take 1 capsule (300 mg total) by mouth 3 (three) times daily. TAKE 1 CAPSULE BY MOUTH THREE TIMES A DAY (Patient not taking: Reported on 05/03/2019)  . JANUVIA 100 MG tablet TAKE 1 TABLET BY MOUTH EVERY DAY (Patient taking differently: Take 100 mg by mouth daily. )  . Lancets (ONETOUCH ULTRASOFT) lancets USE TO HELP CHECK BLOOD SUGARS TWICE A DAY E11.9  . meclizine (ANTIVERT) 25 MG tablet TAKE 1/2 TO 1 TABLET BY MOUTH TWICE A DAY AS NEEDED FOR DIZZINESS  . meloxicam (MOBIC) 7.5 MG tablet TAKE 1 TABLET BY MOUTH EVERY DAY  . methocarbamol (ROBAXIN) 500 MG tablet Take 1 tablet (500 mg total) by mouth 2 (two) times daily.  . metoprolol succinate (TOPROL-XL) 25 MG 24 hr tablet Take 1 tablet (25 mg total) by mouth daily.  . Multiple Vitamin (MULTIVITAMIN) tablet Take 1 tablet by mouth daily.  . Potassium Chloride 40 MEQ/15ML (20%) SOLN Take 40 mg by mouth daily for 7 days.  . predniSONE (DELTASONE) 5 MG tablet Take 6 pills for first day, 5  pills second day, 4 pills third day, 3 pills fourth day, 2 pills the fifth day, and 1 pill sixth day.  . terconazole (TERAZOL 3) 0.8 % vaginal cream Place 1 applicator vaginally at bedtime.   No facility-administered encounter medications on file as of 02/10/2020.    Allergies (verified) Codeine, Januvia [sitagliptin], Other, and Shellfish allergy   History: Past Medical History:  Diagnosis Date  . Arthritis   . Benign essential HTN 02/18/2015  . Cellulitis 02/18/2015  . Chicken pox   . Chronic fatigue   . Dementia ([place])   . Depression    since last 04/2018 daughter passed.    . Diabetes mellitus without complication ([place])   . Diabetic peripheral neuropathy ([place])   . GERD  (gastroesophageal reflux disease)   . Hypercholesteremia   . Hypertension   . Liver disease   . Polyp of colon    Past Surgical History:  Procedure Laterality Date  . APPENDECTOMY    . HERNIA REPAIR    . KNEE SURGERY    . ROTATOR CUFF REPAIR    . TONSILLECTOMY     Family History  Problem Relation Age of Onset  . Healthy Mother   . Healthy Father   . Healthy Paternal Grandmother   . Healthy Paternal Grandfather    Social History   Socioeconomic History  . Marital status: Married    Spouse name: Not on file  . Number of children: 5  . Years of education: 25  . Highest education level: Not on file  Occupational History  . Occupation: Retired  Tobacco Use  . Smoking status: Never Smoker  . Smokeless tobacco: Never Used  Substance and Sexual Activity  . Alcohol use: No  . Drug use: No  . Sexual activity: Not on file  Other Topics Concern  . Not on file  Social History Narrative   Fun: paint, word search   Feels safe where she is living and no concern for abuse   Denies religious beliefs effecting healthcare.    Social Determinants of Health   Financial Resource Strain:   . Difficulty of Paying Living Expenses: Not on file  Food Insecurity:   . Worried About Charity fundraiser in the Last Year: Not on file  . Ran Out of Food in the Last Year: Not on file  Transportation Needs:   . Lack of Transportation (Medical): Not on file  . Lack of Transportation (Non-Medical): Not on file  Physical Activity:   . Days of Exercise per Week: Not on file  . Minutes of Exercise per Session: Not on file  Stress:   . Feeling of Stress : Not on file  Social Connections:   . Frequency of Communication with Friends and Family: Not on file  . Frequency of Social Gatherings with Friends and Family: Not on file  . Attends Religious Services: Not on file  . Active Member of Clubs or Organizations: Not on file  . Attends Archivist Meetings: Not on file  . Marital Status:  Not on file    Tobacco Counseling Counseling given: Not Answered   Clinical Intake:                       Activities of Daily Living No flowsheet data found.  Patient Care Team: S[place], Iris Pert as PCP - General (Internal Medicine) Day, Melvenia Beam, [facility] as Pharmacist (Pharmacist) Day, Melvenia Beam, [facility] as Pharmacist (Pharmacist)  Indicate any recent Medical Services you  may have received from other than [facility] providers in the past year (date may be approximate).     Assessment:   This is a routine wellness examination for Laryn.  Hearing/Vision screen No exam data present  Dietary issues and exercise activities discussed:   Diet (meal preparation, eat out, water intake, caffeinated beverages, dairy products, fruits and vegetables): {Desc; diets:16563} Breakfast: Lunch:  Dinner:      Goals   None    Depression Screen PHQ 2/9 Scores 04/30/2017 12/30/2016 02/12/2016 12/26/2014  PHQ - 2 Score 0 0 0 0    Fall Risk Fall Risk  04/30/2017 12/30/2016 02/12/2016 12/26/2014  Falls in the past year? No No No No    Any stairs in or around the home? {YES/NO:21197} If so, are there any without handrails? {YES/NO:21197} Home free of loose throw rugs in walkways, pet beds, electrical cords, etc? {YES/NO:21197} Adequate lighting in your home to reduce risk of falls? {YES/NO:21197}  ASSISTIVE DEVICES UTILIZED TO PREVENT FALLS:  Life alert? {YES/NO:21197} Use of a cane, walker or w/c? {YES/NO:21197} Grab bars in the bathroom? {YES/NO:21197} Shower chair or bench in shower? {YES/NO:21197} Elevated toilet seat or a handicapped toilet? {YES/NO:21197}    Cognitive Function: Ad8 score reviewed for issues:  Issues making decisions:  Less interest in hobbies / activities:  Repeats questions, stories (family complaining):  Trouble using ordinary gadgets (microwave, computer, phone):  Forgets the month or year:   Mismanaging finances:   Remembering appts:  Daily  problems with thinking and/or memory: Ad8 score is=     MMSE - Mini Mental State Exam 05/26/2018 11/13/2017  Not completed: (No Data) -  Orientation to time 3 4  Orientation to Place 4 1  Registration 3 3  Attention/ Calculation 0 0  Recall 1 1  Language- name 2 objects 2 2  Language- repeat 1 1  Language- follow 3 step command 3 3  Language- read & follow direction 1 1  Write a sentence 1 1  Copy design 0 0  Total score 19 17        Immunizations Immunization History  Administered Date(s) Administered  . Fluad Quad(high Dose 65+) 03/22/2019  . Influenza, High Dose Seasonal PF 05/08/2014, 05/13/2016, 01/26/2017, 02/25/2018  . Influenza,inj,Quad PF,6+ Mos 04/27/2015  . Influenza-Unspecified 03/30/2017  . Pneumococcal Conjugate-13 04/15/2017  . Pneumococcal Polysaccharide-23 07/13/2007, 04/01/2019  . Zoster 12/26/2014    {TDAP status:2101805} Flu Vaccine status: Up to date Pneumococcal vaccine status: Up to date {Covid-19 vaccine status:2101808}  Qualifies for Shingles Vaccine? {YES/NO:21197}  Zostavax completed {YES/NO:21197}  {Shingrix Completed?:2101804}  Screening Tests Health Maintenance  Topic Date Due  . COVID-19 Vaccine (1) Never done  . FOOT EXAM  03/11/2019  . DEXA SCAN  04/23/2019  . HEMOGLOBIN A1C  09/30/2019  . OPHTHALMOLOGY EXAM  10/01/2019  . TETANUS/TDAP  11/14/2019  . INFLUENZA VACCINE  01/15/2020  . Hepatitis C Screening  Completed  . PNA vac Low Risk Adult  Completed    Health Maintenance  Health Maintenance Due  Topic Date Due  . COVID-19 Vaccine (1) Never done  . FOOT EXAM  03/11/2019  . DEXA SCAN  04/23/2019  . HEMOGLOBIN A1C  09/30/2019  . OPHTHALMOLOGY EXAM  10/01/2019  . TETANUS/TDAP  11/14/2019  . INFLUENZA VACCINE  01/15/2020    {Colorectal cancer screening:2101809} {Mammogram status:21018020} {Bone Density status:21018021}  Lung Cancer Screening: (Low Dose CT Chest recommended if Age 74-80 years, 30 pack-year  currently smoking OR have quit w/in 15years.) {DOES NOT does:27190::"does not"}  qualify.   Lung Cancer Screening Referral: ***  Additional Screening:  Hepatitis C Screening: {DOES NOT does:27190::"does not"} qualify; Completed ***  Vision Screening: Recommended annual ophthalmology exams for early detection of glaucoma and other disorders of the eye. Is the patient up to date with their annual eye exam?  {YES/NO:21197} Who is the provider or what is the name of the office in which the patient attends annual eye exams? *** If pt is not established with a provider, would they like to be referred to a provider to establish care? {YES/NO:21197}.   Dental Screening: Recommended annual dental exams for proper oral hygiene  Community Resource Referral / Chronic Care Management: CRR required this visit?  {YES/NO:21197}  CCM required this visit?  {YES/NO:21197}     Plan:     I have personally reviewed and noted the following in the patient's chart:   . Medical and social history . Use of alcohol, tobacco or illicit drugs  . Current medications and supplements . Functional ability and status . Nutritional status . Physical activity . Advanced directives . List of other physicians . Hospitalizations, surgeries, and ER visits in previous 12 months . Vitals . Screenings to include cognitive, depression, and falls . Referrals and appointments  In addition, I have reviewed and discussed with patient certain preventive protocols, quality metrics, and best practice recommendations. A written personalized care plan for preventive services as well as general preventive health recommendations were provided to patient.     Shela Nevin, [place]   02/09/2020   Nurse Notes: ***

## 2020-02-10 ENCOUNTER — Ambulatory Visit: Payer: PPO | Admitting: *Deleted

## 2020-02-17 ENCOUNTER — Other Ambulatory Visit: Payer: Self-pay | Admitting: Medical

## 2020-03-01 DIAGNOSIS — K921 Melena: Secondary | ICD-10-CM | POA: Diagnosis not present

## 2020-03-01 DIAGNOSIS — R1084 Generalized abdominal pain: Secondary | ICD-10-CM | POA: Diagnosis not present

## 2020-03-01 DIAGNOSIS — Z01818 Encounter for other preprocedural examination: Secondary | ICD-10-CM | POA: Diagnosis not present

## 2020-03-01 DIAGNOSIS — K5909 Other constipation: Secondary | ICD-10-CM | POA: Diagnosis not present

## 2020-03-06 ENCOUNTER — Telehealth: Payer: PPO

## 2020-03-06 DIAGNOSIS — R159 Full incontinence of feces: Secondary | ICD-10-CM | POA: Diagnosis not present

## 2020-03-06 DIAGNOSIS — I10 Essential (primary) hypertension: Secondary | ICD-10-CM | POA: Diagnosis not present

## 2020-03-06 DIAGNOSIS — Z6829 Body mass index (BMI) 29.0-29.9, adult: Secondary | ICD-10-CM | POA: Diagnosis not present

## 2020-03-06 DIAGNOSIS — F0390 Unspecified dementia without behavioral disturbance: Secondary | ICD-10-CM | POA: Diagnosis not present

## 2020-03-06 DIAGNOSIS — E663 Overweight: Secondary | ICD-10-CM | POA: Diagnosis not present

## 2020-03-06 DIAGNOSIS — R350 Frequency of micturition: Secondary | ICD-10-CM | POA: Diagnosis not present

## 2020-03-06 DIAGNOSIS — Z008 Encounter for other general examination: Secondary | ICD-10-CM | POA: Diagnosis not present

## 2020-03-21 DIAGNOSIS — H34831 Tributary (branch) retinal vein occlusion, right eye, with macular edema: Secondary | ICD-10-CM | POA: Diagnosis not present

## 2020-03-26 ENCOUNTER — Telehealth: Payer: Self-pay | Admitting: Pharmacist

## 2020-03-26 DIAGNOSIS — I70201 Unspecified atherosclerosis of native arteries of extremities, right leg: Secondary | ICD-10-CM | POA: Diagnosis not present

## 2020-03-26 DIAGNOSIS — I10 Essential (primary) hypertension: Secondary | ICD-10-CM | POA: Diagnosis not present

## 2020-03-26 DIAGNOSIS — Z23 Encounter for immunization: Secondary | ICD-10-CM | POA: Diagnosis not present

## 2020-03-26 DIAGNOSIS — Z008 Encounter for other general examination: Secondary | ICD-10-CM | POA: Diagnosis not present

## 2020-03-26 DIAGNOSIS — M1990 Unspecified osteoarthritis, unspecified site: Secondary | ICD-10-CM | POA: Diagnosis not present

## 2020-03-26 DIAGNOSIS — Z6829 Body mass index (BMI) 29.0-29.9, adult: Secondary | ICD-10-CM | POA: Diagnosis not present

## 2020-03-26 NOTE — Progress Notes (Signed)
Chronic Care Management Pharmacy Assistant   Name: Meredith Phillips  MRN: [id number] DOB: 1946-05-24  Reason for Encounter: Initial Questions   PCP : Mackie Pai, PA-C  Allergies:   Allergies  Allergen Reactions  . Codeine Other (See Comments)    Woke up the next morning and did not know where she was  . Januvia [Sitagliptin] Itching  . Other     Doesn't like to take pain medication due to hallucinations.  . Shellfish Allergy     Medications: Outpatient Encounter Medications as of 03/26/2020  Medication Sig Note  . acetaZOLAMIDE (DIAMOX) 250 MG tablet TAKE 1 TABLET BY MOUTH TWICE A DAY (Patient taking differently: Take 250 mg by mouth 2 (two) times daily. ) 05/03/2019: 15 day supply Last filled 03/17/2019  . amLODipine (NORVASC) 10 MG tablet TAKE 1 TABLET BY MOUTH EVERY DAY   . aspirin EC 81 MG tablet Take 81 mg by mouth daily.   [name] atorvastatin (LIPITOR) 20 MG tablet TAKE 1 TABLET BY MOUTH EVERY DAY   . blood glucose meter kit and supplies Dispense based on patient and insurance preference. Use up to four times daily as directed. (FOR ICD-10 E10.9, E11.9).   [name] Blood Glucose Monitoring Suppl (ONE TOUCH ULTRA 2) w/Device KIT Use to check blood sugars twice a day Dx E11.9   . Cholecalciferol (VITAMIN D-3) 1000 UNITS CAPS Take 1,000 Units by mouth.   . cloNIDine (CATAPRES) 0.1 MG tablet Take 1 tablet (0.1 mg total) by mouth 2 (two) times daily. 05/03/2019: 90 day supply Last filled 02/06/2019  . cyanocobalamin 1000 MCG tablet Take 1,000 mcg by mouth daily.   [name] esomeprazole (NEXIUM) 40 MG capsule TAKE 1 CAPSULE (40 MG TOTAL) BY MOUTH DAILY AT 12 NOON.   . famotidine (PEPCID) 20 MG tablet TAKE 1 TABLET BY MOUTH TWICE A DAY (Patient not taking: Reported on 05/03/2019)   . FREESTYLE LITE test strip USE AS DIRECTED UP TO 4 TIMES DAILY   . gabapentin (NEURONTIN) 300 MG capsule Take 1 capsule (300 mg total) by mouth 3 (three) times daily. TAKE 1 CAPSULE BY MOUTH THREE TIMES A DAY  (Patient not taking: Reported on 05/03/2019)   . JANUVIA 100 MG tablet TAKE 1 TABLET BY MOUTH EVERY DAY (Patient taking differently: Take 100 mg by mouth daily. ) 05/03/2019: 90 day supply Last filled 04/22/2019  . Lancets (ONETOUCH ULTRASOFT) lancets USE TO HELP CHECK BLOOD SUGARS TWICE A DAY E11.9   . meclizine (ANTIVERT) 25 MG tablet TAKE 1/2 TO 1 TABLET BY MOUTH TWICE A DAY AS NEEDED FOR DIZZINESS   . meloxicam (MOBIC) 7.5 MG tablet TAKE 1 TABLET BY MOUTH EVERY DAY   . methocarbamol (ROBAXIN) 500 MG tablet Take 1 tablet (500 mg total) by mouth 2 (two) times daily.   . metoprolol succinate (TOPROL-XL) 25 MG 24 hr tablet Take 1 tablet (25 mg total) by mouth daily. 05/03/2019: 90 day supply Last filled 04/30/2019  . Multiple Vitamin (MULTIVITAMIN) tablet Take 1 tablet by mouth daily.   . Potassium Chloride 40 MEQ/15ML (20%) SOLN Take 40 mg by mouth daily for 7 days.   . predniSONE (DELTASONE) 5 MG tablet Take 6 pills for first day, 5 pills second day, 4 pills third day, 3 pills fourth day, 2 pills the fifth day, and 1 pill sixth day.   . terconazole (TERAZOL 3) 0.8 % vaginal cream Place 1 applicator vaginally at bedtime.    No facility-administered encounter medications on file as of 03/26/2020.  Current Diagnosis: Patient Active Problem List   Diagnosis Date Noted  . Malaise and fatigue 04/26/2019  . Head trauma 03/29/2019  . Greater trochanteric pain syndrome of left lower extremity 01/18/2019  . Hyperlipidemia 03/26/2018  . Hyponatremia 03/26/2018  . Atypical chest pain 03/25/2018  . Urge incontinence 02/25/2018  . Glucosuria 02/25/2018  . Anxiety and depression 12/16/2017  . Memory loss 11/02/2017  . B12 deficiency 11/02/2017  . Vertigo 10/10/2017  . Trochanteric bursitis of left hip 10/10/2017  . Murmur, cardiac 04/15/2017  . Asymptomatic postmenopausal estrogen deficiency 04/15/2017  . Right-sided low back pain without sciatica 02/10/2017  . Grief at loss of child 07/29/2016   . Allergic rhinitis 02/27/2016  . GERD (gastroesophageal reflux disease) 08/06/2015  . Baker's cyst, ruptured 08/03/2015  . Type 2 diabetes mellitus ([place]) 06/29/2015  . It band syndrome, left 06/15/2015  . Benign essential HTN 02/18/2015  . Cellulitis 02/18/2015  . Sciatica 01/17/2015  . Routine general medical examination at a health care facility 12/26/2014  . Medicare annual wellness visit, subsequent 12/26/2014  . Generalized osteoarthritis of multiple sites 11/25/2014  . GERD with esophagitis 11/25/2014  . Hip arthrosis 11/25/2014  . Diarrhea in adult patient 11/29/2013    Goals Addressed   None     Chronic Care Management   Outreach Note  03/27/2020 Name: Meredith Phillips MRN: [id number] DOB: [birth year]  Referred by: Mackie Pai, PA-C Reason for referral : Chronic Care Management   An unsuccessful telephone outreach was attempted today. The patient was referred to the pharmacist for assistance with care management and care coordination.  Unable to contact patient prior to their initial visit.   Follow-Up:  Pharmacist Review   Fanny Skates, [place] Pharmacist Assistant [phone]

## 2020-03-27 ENCOUNTER — Ambulatory Visit: Payer: PPO | Admitting: Pharmacist

## 2020-03-27 NOTE — Chronic Care Management (AMB) (Signed)
   Chronic Care Management Pharmacy  Name: Meredith Phillips  MRN: [id number] DOB: February 03, 1946  Chief Complaint/ HPI  Meredith Phillips,  75 y.o. , female presents for their Initial CCM visit with the clinical pharmacist via telephone due to COVID-19 Pandemic.  PCP : Esperanza Richters, PA-C  Patient states she has a new PCP and therefore is ineligible for CCM services through [facility].   Katrinka Blazing, PharmD Clinical Pharmacist [facility] [phone]

## 2020-03-28 ENCOUNTER — Other Ambulatory Visit: Payer: Self-pay | Admitting: Medical

## 2020-03-28 DIAGNOSIS — K2100 Gastro-esophageal reflux disease with esophagitis, without bleeding: Secondary | ICD-10-CM

## 2020-04-23 ENCOUNTER — Other Ambulatory Visit: Payer: Self-pay | Admitting: Medical

## 2020-05-02 DIAGNOSIS — H34831 Tributary (branch) retinal vein occlusion, right eye, with macular edema: Secondary | ICD-10-CM | POA: Diagnosis not present

## 2020-05-14 DIAGNOSIS — I10 Essential (primary) hypertension: Secondary | ICD-10-CM | POA: Diagnosis not present

## 2020-05-14 DIAGNOSIS — Z79899 Other long term (current) drug therapy: Secondary | ICD-10-CM | POA: Diagnosis not present

## 2020-05-14 DIAGNOSIS — Z008 Encounter for other general examination: Secondary | ICD-10-CM | POA: Diagnosis not present

## 2020-05-14 DIAGNOSIS — Z23 Encounter for immunization: Secondary | ICD-10-CM | POA: Diagnosis not present

## 2020-05-14 DIAGNOSIS — M1990 Unspecified osteoarthritis, unspecified site: Secondary | ICD-10-CM | POA: Diagnosis not present

## 2020-05-14 DIAGNOSIS — M25541 Pain in joints of right hand: Secondary | ICD-10-CM | POA: Diagnosis not present

## 2020-05-14 DIAGNOSIS — M069 Rheumatoid arthritis, unspecified: Secondary | ICD-10-CM | POA: Diagnosis not present

## 2020-05-30 DIAGNOSIS — E7800 Pure hypercholesterolemia, unspecified: Secondary | ICD-10-CM | POA: Diagnosis not present

## 2020-05-30 DIAGNOSIS — K5792 Diverticulitis of intestine, part unspecified, without perforation or abscess without bleeding: Secondary | ICD-10-CM | POA: Diagnosis not present

## 2020-05-30 DIAGNOSIS — I10 Essential (primary) hypertension: Secondary | ICD-10-CM | POA: Diagnosis not present

## 2020-05-30 DIAGNOSIS — Z1159 Encounter for screening for other viral diseases: Secondary | ICD-10-CM | POA: Diagnosis not present

## 2020-05-30 DIAGNOSIS — E559 Vitamin D deficiency, unspecified: Secondary | ICD-10-CM | POA: Diagnosis not present

## 2020-05-30 DIAGNOSIS — Z131 Encounter for screening for diabetes mellitus: Secondary | ICD-10-CM | POA: Diagnosis not present

## 2020-05-30 DIAGNOSIS — E538 Deficiency of other specified B group vitamins: Secondary | ICD-10-CM | POA: Diagnosis not present

## 2020-05-30 DIAGNOSIS — R5383 Other fatigue: Secondary | ICD-10-CM | POA: Diagnosis not present

## 2020-05-30 DIAGNOSIS — R0602 Shortness of breath: Secondary | ICD-10-CM | POA: Diagnosis not present

## 2020-10-16 ENCOUNTER — Other Ambulatory Visit: Payer: Self-pay | Admitting: Medical

## 2020-10-16 DIAGNOSIS — K2100 Gastro-esophageal reflux disease with esophagitis, without bleeding: Secondary | ICD-10-CM

## 2020-12-28 DIAGNOSIS — M62838 Other muscle spasm: Secondary | ICD-10-CM | POA: Insufficient documentation

## 2020-12-28 HISTORY — DX: Other muscle spasm: M62.838

## 2021-01-02 ENCOUNTER — Emergency Department (HOSPITAL_COMMUNITY): Payer: Medicare Other

## 2021-01-02 ENCOUNTER — Encounter (HOSPITAL_COMMUNITY): Payer: Self-pay | Admitting: Emergency Medicine

## 2021-01-02 ENCOUNTER — Observation Stay (HOSPITAL_COMMUNITY)
Admission: EM | Admit: 2021-01-02 | Discharge: 2021-01-03 | Disposition: A | Discharge status: Home / Self Care | Payer: Medicare Other | Attending: Family Medicine | Admitting: Family Medicine

## 2021-01-02 DIAGNOSIS — E119 Type 2 diabetes mellitus without complications: Secondary | ICD-10-CM | POA: Diagnosis not present

## 2021-01-02 DIAGNOSIS — R0789 Other chest pain: Secondary | ICD-10-CM | POA: Diagnosis present

## 2021-01-02 DIAGNOSIS — I160 Hypertensive urgency: Secondary | ICD-10-CM | POA: Diagnosis not present

## 2021-01-02 DIAGNOSIS — F0390 Unspecified dementia without behavioral disturbance: Secondary | ICD-10-CM | POA: Insufficient documentation

## 2021-01-02 DIAGNOSIS — Z7982 Long term (current) use of aspirin: Secondary | ICD-10-CM | POA: Diagnosis not present

## 2021-01-02 DIAGNOSIS — R079 Chest pain, unspecified: Secondary | ICD-10-CM | POA: Diagnosis not present

## 2021-01-02 DIAGNOSIS — Z79899 Other long term (current) drug therapy: Secondary | ICD-10-CM | POA: Diagnosis not present

## 2021-01-02 DIAGNOSIS — I10 Essential (primary) hypertension: Secondary | ICD-10-CM | POA: Insufficient documentation

## 2021-01-02 DIAGNOSIS — Z20822 Contact with and (suspected) exposure to covid-19: Secondary | ICD-10-CM | POA: Insufficient documentation

## 2021-01-02 LAB — BASIC METABOLIC PANEL
Anion gap: 9 (ref 5–15)
BUN: 8 mg/dL (ref 8–23)
CO2: 25 mmol/L (ref 22–32)
Calcium: 10.1 mg/dL (ref 8.9–10.3)
Chloride: 105 mmol/L (ref 98–111)
Creatinine, Ser: 1.19 mg/dL — ABNORMAL HIGH (ref 0.44–1.00)
GFR, Estimated: 47 mL/min — ABNORMAL LOW (ref >60)
Glucose, Bld: 105 mg/dL — ABNORMAL HIGH (ref 70–99)
Potassium: 3.6 mmol/L (ref 3.5–5.1)
Sodium: 139 mmol/L (ref 135–145)

## 2021-01-02 LAB — TROPONIN I (HIGH SENSITIVITY)
Troponin I (High Sensitivity): 7 ng/L (ref <18)
Troponin I (High Sensitivity): 6 ng/L (ref <18)

## 2021-01-02 LAB — CBC
HCT: 41.4 % (ref 36.0–46.0)
HCT: 38.6 % (ref 36.0–46.0)
Hemoglobin: 13 g/dL (ref 12.0–15.0)
Hemoglobin: 12.4 g/dL (ref 12.0–15.0)
MCH: 28.3 pg (ref 26.0–34.0)
MCH: 29.4 pg (ref 26.0–34.0)
MCHC: 31.4 g/dL (ref 30.0–36.0)
MCHC: 32.1 g/dL (ref 30.0–36.0)
MCV: 90.2 fL (ref 80.0–100.0)
MCV: 91.5 fL (ref 80.0–100.0)
Platelets: 325 10*3/uL (ref 150–400)
Platelets: 270 10*3/uL (ref 150–400)
RBC: 4.59 MIL/uL (ref 3.87–5.11)
RBC: 4.22 MIL/uL (ref 3.87–5.11)
RDW: 13 % (ref 11.5–15.5)
RDW: 12.9 % (ref 11.5–15.5)
WBC: 5.8 10*3/uL (ref 4.0–10.5)
WBC: 4.8 10*3/uL (ref 4.0–10.5)
nRBC: 0 % (ref 0.0–0.2)
nRBC: 0 % (ref 0.0–0.2)

## 2021-01-02 LAB — SARS CORONAVIRUS 2 (TAT 6-24 HRS): SARS Coronavirus 2: NEGATIVE

## 2021-01-02 LAB — CBG MONITORING, ED: Glucose-Capillary: 88 mg/dL (ref 70–99)

## 2021-01-02 LAB — DIFFERENTIAL
Abs Immature Granulocytes: 0.01 10*3/uL (ref 0.00–0.07)
Basophils Absolute: 0 10*3/uL (ref 0.0–0.1)
Basophils Relative: 1 %
Eosinophils Absolute: 0.3 10*3/uL (ref 0.0–0.5)
Eosinophils Relative: 5 %
Immature Granulocytes: 0 %
Lymphocytes Relative: 48 %
Lymphs Abs: 2.9 10*3/uL (ref 0.7–4.0)
Monocytes Absolute: 0.5 10*3/uL (ref 0.1–1.0)
Monocytes Relative: 8 %
Neutro Abs: 2.3 10*3/uL (ref 1.7–7.7)
Neutrophils Relative %: 38 %

## 2021-01-02 LAB — HEPATIC FUNCTION PANEL
ALT: 11 U/L (ref 0–44)
AST: 16 U/L (ref 15–41)
Albumin: 3.9 g/dL (ref 3.5–5.0)
Alkaline Phosphatase: 61 U/L (ref 38–126)
Bilirubin, Direct: <0.1 mg/dL (ref 0.0–0.2)
Total Bilirubin: 1 mg/dL (ref 0.3–1.2)
Total Protein: 7.7 g/dL (ref 6.5–8.1)

## 2021-01-02 LAB — CREATININE, SERUM
Creatinine, Ser: 1.08 mg/dL — ABNORMAL HIGH (ref 0.44–1.00)
GFR, Estimated: 53 mL/min — ABNORMAL LOW (ref >60)

## 2021-01-02 MED ORDER — LISINOPRIL 20 MG PO TABS
20.0000 mg | ORAL_TABLET | Freq: Every day | ORAL | Status: DC
  Administered 2021-01-03: 20 mg via ORAL
  Filled 2021-01-02: qty 1

## 2021-01-02 MED ORDER — CLONIDINE HCL 0.1 MG PO TABS
0.1000 mg | ORAL_TABLET | Freq: Two times a day (BID) | ORAL | Status: DC
  Administered 2021-01-02 – 2021-01-03 (×2): 0.1 mg via ORAL
  Filled 2021-01-02 (×2): qty 1

## 2021-01-02 MED ORDER — PANTOPRAZOLE SODIUM 40 MG PO TBEC
40.0000 mg | DELAYED_RELEASE_TABLET | Freq: Every day | ORAL | Status: DC
  Administered 2021-01-03: 40 mg via ORAL
  Filled 2021-01-02: qty 1

## 2021-01-02 MED ORDER — AMLODIPINE BESYLATE 5 MG PO TABS
10.0000 mg | ORAL_TABLET | Freq: Every day | ORAL | Status: DC
  Administered 2021-01-02 – 2021-01-03 (×2): 10 mg via ORAL
  Filled 2021-01-02 (×2): qty 2

## 2021-01-02 MED ORDER — ACETAZOLAMIDE 250 MG PO TABS
250.0000 mg | ORAL_TABLET | Freq: Two times a day (BID) | ORAL | Status: DC
  Administered 2021-01-03: 250 mg via ORAL
  Filled 2021-01-02 (×2): qty 1

## 2021-01-02 MED ORDER — HYDRALAZINE HCL 20 MG/ML IJ SOLN: 10.0000 mg | INTRAMUSCULAR | Status: DC | PRN

## 2021-01-02 MED ORDER — ACETAMINOPHEN 325 MG PO TABS: 650.0000 mg | ORAL_TABLET | Freq: Four times a day (QID) | ORAL | Status: DC | PRN

## 2021-01-02 MED ORDER — ASPIRIN EC 81 MG PO TBEC
81.0000 mg | DELAYED_RELEASE_TABLET | Freq: Every day | ORAL | Status: DC
  Administered 2021-01-02 – 2021-01-03 (×2): 81 mg via ORAL
  Filled 2021-01-02 (×2): qty 1

## 2021-01-02 MED ORDER — METOPROLOL SUCCINATE ER 25 MG PO TB24: 12.5000 mg | ORAL_TABLET | Freq: Every day | ORAL | Status: DC

## 2021-01-02 MED ORDER — ENOXAPARIN SODIUM 40 MG/0.4ML IJ SOSY
40.0000 mg | PREFILLED_SYRINGE | INTRAMUSCULAR | Status: DC
  Administered 2021-01-02: 40 mg via SUBCUTANEOUS
  Filled 2021-01-02: qty 0.4

## 2021-01-02 MED ORDER — INSULIN ASPART 100 UNIT/ML IJ SOLN: 0.0000 [IU] | Freq: Three times a day (TID) | INTRAMUSCULAR | Status: DC

## 2021-01-02 MED ORDER — ACETAMINOPHEN 650 MG RE SUPP: 650.0000 mg | Freq: Four times a day (QID) | RECTAL | Status: DC | PRN

## 2021-01-02 MED ORDER — ATORVASTATIN CALCIUM 10 MG PO TABS
20.0000 mg | ORAL_TABLET | Freq: Every day | ORAL | Status: DC
  Administered 2021-01-03: 20 mg via ORAL
  Filled 2021-01-02: qty 2

## 2021-01-02 MED ORDER — SODIUM CHLORIDE 0.9 % IV BOLUS
500.0000 mL | Freq: Once | INTRAVENOUS | Status: AC
Start: 2021-01-02 — End: 2021-01-02
  Administered 2021-01-02: 500 mL via INTRAVENOUS

## 2021-01-02 NOTE — ED Notes (Signed)
Patient not making sense reports she doesn't like feeling tied down so she took all monitoring equipment off and pulled out IV.  Patient ambulatory to bathroom with no assistance.  Advised she is staying in the hospital and will need to replace all equipment and IV.

## 2021-01-02 NOTE — ED Provider Notes (Signed)
[facility] EMERGENCY DEPARTMENT Provider Note   CSN: [id number] Arrival date & time: 01/02/21  1124     History Chief Complaint  Patient presents with   Chest Pain    Meredith Phillips is a 75 y.o. female with a history of hypertension, diabetes mellitus, GERD, dementia.  Patient presents to the emergency department with a chief complaint of chest pain.  Patient states that chest pain started approximately 1000 earlier today.  Chest pain is left-sided and does not radiate.  Patient describes pain as an aching.  Pain rated 10/10 on the pain scale.  Patient reports that pain was worse with touch and usually improves with Nexium.  Patient endorsed associated shortness of breath.  No nausea, vomiting, or diaphoresis.  Patient denies any pain at present reporting that it stopped after being moved to room in emergency department.  Patient states that pain started today while she became upset over her daughter being in the ICU.  Patient reports previous episodes of chest pain in the past.  Patient endorses chronic back pain however denies any change in her pain today.  Patient denies any recent falls or injuries.  Patient denies any history of PE/DVT, hormone therapy, surgery last 4 weeks, hormone therapy, cancer treatment, smoking history, family history of MI individuals less than 85 years old.     Chest Pain Associated symptoms: shortness of breath   Associated symptoms: no abdominal pain, no back pain, no cough, no dizziness, no fever, no headache, no nausea, no palpitations and no vomiting       Past Medical History:  Diagnosis Date   Arthritis    Benign essential HTN 02/18/2015   Cellulitis 02/18/2015   Chicken pox    Chronic fatigue    Dementia ([place])    Depression    since last 04/2018 daughter passed.     Diabetes mellitus without complication ([place])    Diabetic peripheral neuropathy ([facility])    GERD (gastroesophageal reflux disease)    Hypercholesteremia     Hypertension    Liver disease    Polyp of colon     Patient Active Problem List   Diagnosis Date Noted   Malaise and fatigue 04/26/2019   Head trauma 03/29/2019   Greater trochanteric pain syndrome of left lower extremity 01/18/2019   Hyperlipidemia 03/26/2018   Hyponatremia 03/26/2018   Atypical chest pain 03/25/2018   Urge incontinence 02/25/2018   Glucosuria 02/25/2018   Anxiety and depression 12/16/2017   Memory loss 11/02/2017   B12 deficiency 11/02/2017   Vertigo 10/10/2017   Trochanteric bursitis of left hip 10/10/2017   Murmur, cardiac 04/15/2017   Asymptomatic postmenopausal estrogen deficiency 04/15/2017   Right-sided low back pain without sciatica 02/10/2017   Grief at loss of child 07/29/2016   Allergic rhinitis 02/27/2016   GERD (gastroesophageal reflux disease) 08/06/2015   Baker's cyst, ruptured 08/03/2015   Type 2 diabetes mellitus ([place]) 06/29/2015   It band syndrome, left 06/15/2015   Benign essential HTN 02/18/2015   Cellulitis 02/18/2015   Sciatica 01/17/2015   Routine general medical examination at a health care facility 12/26/2014   Medicare annual wellness visit, subsequent 12/26/2014   Generalized osteoarthritis of multiple sites 11/25/2014   GERD with esophagitis 11/25/2014   Hip arthrosis 11/25/2014   Diarrhea in adult patient 11/29/2013    Past Surgical History:  Procedure Laterality Date   [place]  TONSILLECTOMY       OB History   No obstetric history on file.     Family History  Problem Relation Age of Onset   Healthy Mother    Healthy Father    Healthy Paternal Grandmother    Healthy Paternal Grandfather     Social History   Tobacco Use   Smoking status: Never   Smokeless tobacco: Never  Substance Use Topics   Alcohol use: No   Drug use: No    Home Medications Prior to Admission medications   Medication Sig Start Date End Date Taking? Authorizing  Provider  acetaZOLAMIDE (DIAMOX) 250 MG tablet TAKE 1 TABLET BY MOUTH TWICE A DAY Patient taking differently: Take 250 mg by mouth 2 (two) times daily.  02/22/19   Saguier, Percell Miller, PA-C  amLODipine (NORVASC) 10 MG tablet TAKE 1 TABLET BY MOUTH EVERY DAY 10/16/20   Saguier, Percell Miller, PA-C  aspirin EC 81 MG tablet Take 81 mg by mouth daily.    [provider]  atorvastatin (LIPITOR) 20 MG tablet TAKE 1 TABLET BY MOUTH EVERY DAY 10/16/20   Saguier, Percell Miller, PA-C  blood glucose meter kit and supplies Dispense based on patient and insurance preference. Use up to four times daily as directed. (FOR ICD-10 E10.9, E11.9). 04/01/19   Saguier, Percell Miller, PA-C  Blood Glucose Monitoring Suppl (ONE TOUCH ULTRA 2) w/Device KIT Use to check blood sugars twice a day Dx E11.9 09/01/17   Marrian Salvage, FNP  Cholecalciferol (VITAMIN D-3) 1000 UNITS CAPS Take 1,000 Units by mouth.    [provider]  cloNIDine (CATAPRES) 0.1 MG tablet Take 1 tablet (0.1 mg total) by mouth 2 (two) times daily. 10/11/18   Saguier, Percell Miller, PA-C  cyanocobalamin 1000 MCG tablet Take 1,000 mcg by mouth daily.    [provider]  esomeprazole (NEXIUM) 40 MG capsule TAKE 1 CAPSULE (40 MG TOTAL) BY MOUTH DAILY AT 12 NOON. 10/16/20   Saguier, Percell Miller, PA-C  famotidine (PEPCID) 20 MG tablet TAKE 1 TABLET BY MOUTH TWICE A DAY Patient not taking: Reported on 05/03/2019 12/08/18   Saguier, Percell Miller, PA-C  FREESTYLE LITE test strip USE AS DIRECTED UP TO 4 TIMES DAILY 05/31/19   Saguier, Percell Miller, PA-C  gabapentin (NEURONTIN) 300 MG capsule Take 1 capsule (300 mg total) by mouth 3 (three) times daily. TAKE 1 CAPSULE BY MOUTH THREE TIMES A DAY Patient not taking: Reported on 05/03/2019 05/17/18   Lance Sell, NP  JANUVIA 100 MG tablet TAKE 1 TABLET BY MOUTH EVERY DAY Patient taking differently: Take 100 mg by mouth daily.  05/02/19   Saguier, Percell Miller, PA-C  Lancets [facility] ULTRASOFT) lancets USE TO HELP CHECK BLOOD SUGARS TWICE  A DAY E11.9 11/30/17   Lance Sell, NP  meclizine (ANTIVERT) 25 MG tablet TAKE 1/2 TO 1 TABLET BY MOUTH TWICE A DAY AS NEEDED FOR DIZZINESS 02/21/20   Saguier, Percell Miller, PA-C  meloxicam (MOBIC) 7.5 MG tablet TAKE 1 TABLET BY MOUTH EVERY DAY 11/23/19   Saguier, Percell Miller, PA-C  methocarbamol (ROBAXIN) 500 MG tablet Take 1 tablet (500 mg total) by mouth 2 (two) times daily. 04/16/19   Lucrezia Starch, MD  metoprolol succinate (TOPROL-XL) 25 MG 24 hr tablet Take 1 tablet (25 mg total) by mouth daily. 03/04/19   Saguier, Percell Miller, PA-C  Multiple Vitamin (MULTIVITAMIN) tablet Take 1 tablet by mouth daily.    [provider]  Potassium Chloride 40 MEQ/15ML (20%) SOLN Take 40 mg by mouth daily for 7 days. 05/03/19  05/10/19  Dorie Rank, MD  predniSONE (DELTASONE) 5 MG tablet Take 6 pills for first day, 5 pills second day, 4 pills third day, 3 pills fourth day, 2 pills the fifth day, and 1 pill sixth day. 03/29/19   Rosemarie Ax, MD  terconazole (TERAZOL 3) 0.8 % vaginal cream Place 1 applicator vaginally at bedtime. 09/22/18   Saguier, Percell Miller, PA-C    Allergies    Codeine, Januvia [sitagliptin], Other, and Shellfish allergy  Review of Systems   Review of Systems  Constitutional:  Negative for chills and fever.  Eyes:  Negative for visual disturbance.  Respiratory:  Positive for shortness of breath. Negative for cough.   Cardiovascular:  Positive for chest pain. Negative for palpitations and leg swelling.  Gastrointestinal:  Negative for abdominal pain, nausea and vomiting.  Genitourinary:  Negative for difficulty urinating, dysuria, frequency and hematuria.  Musculoskeletal:  Negative for back pain and neck pain.  Skin:  Negative for color change and rash.  Neurological:  Negative for dizziness, syncope, light-headedness and headaches.  Psychiatric/Behavioral:  Negative for confusion.    Physical Exam Updated Vital Signs BP (!) 215/110   Pulse 63   SpO2 100%   Physical  Exam Vitals and nursing note reviewed.  Constitutional:      General: She is not in acute distress.    Appearance: She is not ill-appearing, toxic-appearing or diaphoretic.  HENT:     Head: Normocephalic.  Eyes:     General: No scleral icterus.       Right eye: No discharge.        Left eye: No discharge.  Cardiovascular:     Rate and Rhythm: Normal rate.     Pulses:          Radial pulses are 2+ on the right side and 2+ on the left side.  Pulmonary:     Effort: Pulmonary effort is normal. No tachypnea, bradypnea or respiratory distress.     Breath sounds: Normal breath sounds. No stridor.  Chest:     Chest wall: No deformity, swelling, tenderness or crepitus.  Abdominal:     General: Bowel sounds are normal. There is no distension. There are no signs of injury.     Palpations: Abdomen is soft. There is no mass or pulsatile mass.     Tenderness: There is no abdominal tenderness. There is no guarding or rebound.     Hernia: There is no hernia in the umbilical area or ventral area.  Musculoskeletal:     Cervical back: Neck supple.     Right lower leg: No swelling or tenderness. No edema.     Left lower leg: No swelling or tenderness. No edema.     Comments: No midline tenderness deformity to cervical, thoracic, lumbar spine.  Patient has no tenderness to her entire back.  Skin:    General: Skin is warm and dry.  Neurological:     General: No focal deficit present.     Mental Status: She is alert.  Psychiatric:        Behavior: Behavior is cooperative.    ED Results / Procedures / Treatments   Labs (all labs ordered are listed, but only abnormal results are displayed) Labs Reviewed  BASIC METABOLIC PANEL - Abnormal; Notable for the following components:      Result Value   Glucose, Bld 105 (*)    Creatinine, Ser 1.19 (*)    GFR, Estimated 47 (*)    All other components within  normal limits  SARS CORONAVIRUS 2 (TAT 6-24 HRS)  CBC  HEPATIC FUNCTION PANEL  DIFFERENTIAL   CBC WITH DIFFERENTIAL/PLATELET  TROPONIN I (HIGH SENSITIVITY)  TROPONIN I (HIGH SENSITIVITY)    EKG EKG Interpretation  Date/Time:  Wednesday January 02 2021 11:38:10 EDT Ventricular Rate:  54 PR Interval:  156 QRS Duration: 130 QT Interval:  494 QTC Calculation: 468 R Axis:   -33 Text Interpretation: Sinus bradycardia Left axis deviation Left bundle branch block Non-specific ST-t changes Confirmed by Lajean Saver [phone]) on 01/02/2021 3:45:48 PM  Radiology DG Chest 2 View  Result Date: 01/02/2021 CLINICAL DATA:  Chest pain EXAM: CHEST - 2 VIEW COMPARISON:  10/18/2020 FINDINGS: The heart size and mediastinal contours are within normal limits. Atherosclerotic calcification of the aortic knob. No focal airspace consolidation, pleural effusion, or pneumothorax. The visualized skeletal structures are unremarkable. IMPRESSION: No active cardiopulmonary disease. Electronically Signed   By: Davina Poke D.O.   On: 01/02/2021 13:19    Procedures Procedures   Medications Ordered in ED Medications  metoprolol succinate (TOPROL-XL) 24 hr tablet 12.5 mg (has no administration in time range)  amLODipine (NORVASC) tablet 10 mg (has no administration in time range)  lisinopril (ZESTRIL) tablet 20 mg (has no administration in time range)  aspirin EC tablet 81 mg (has no administration in time range)  cloNIDine (CATAPRES) tablet 0.1 mg (has no administration in time range)  sodium chloride 0.9 % bolus 500 mL (0 mLs Intravenous Stopped 01/02/21 1456)    ED Course  I have reviewed the triage vital signs and the nursing notes.  Pertinent labs & imaging results that were available during my care of the patient were reviewed by me and considered in my medical decision making (see chart for details).  Clinical Course as of 01/02/21 1906  Wed Jan 02, 2021  1630 Spoke to [place]  [PB]  1638 Spoke to Water engineer  [PB]    Clinical Course User Index [PB] Dyann Ruddle   MDM  Rules/Calculators/A&P                           Alert 75 year old female, nontoxic-appearing.  Patient presents with a chief complaint left-sided chest pain.  Pain started approximately 1000 today, no pain at present.  Patient described pain as aching sensation endorses associated shortness of breath yesterday.  Per chart review , patient was admitted on 5/5 discharged on 5/6 for chest pain.  EKG showed left bundle branch block, CTA chest, abdomen, and pelvis are unremarkable for aortic dissection or aneurysm.  Negative stress test negative.  Will obtain ACS work-up at this time. EKG sinus bradycardia, left axis deviation, left bundle blanch block, nonspecific ST changes CXR shows no active cardiopulmonary disease. Troponin 7 and 6 with delta of -1 Patient has elevated heart score of 5, will consult hospitalist team for observation  CBC is unremarkable BMP shows creatinine elevated at 1.19; will initiate fluids.   Hepatic function unremarkable  Patient remains chest pain-free on serial reexamination.  1630 spoke to Dr. Lorin Mercy who recommended cardiology be consulted prior to admission for overnight observation. 1638 spoke to cardiology coordinator.  Per cardiology note echocardiogram has been ordered to rule out stress-induced CM.  We will repage hospitalist team for admission.  1920 spoke to Dr. Hal Hope who will see the patient for admission.  Final Clinical Impression(s) / ED Diagnoses Final diagnoses:  Chest pain, unspecified type    Rx / DC  Orders ED Discharge Orders     None        Dyann Ruddle 01/02/21 Annie Main, MD 01/07/21 [phone]

## 2021-01-02 NOTE — Consult Note (Signed)
Cardiology Consultation:   Patient ID: Meredith Phillips MRN: [id number]; DOB: 1946/04/27  Admit date: 01/02/2021 Date of Consult: 01/02/2021  PCP:  Merryl Hacker, No   [facility] Providers Cardiologist: New to [facility]   Patient Profile:   Meredith Phillips is a 75 y.o. female with a PMH of atypical chest pain, HTN, HLD, DM type 2, dementia, GERD who is being seen 01/02/2021 for the evaluation of chest pain at the request of Debbe Mounts, PA.  History of Present Illness:   [name] was in the hospital visiting her daughter who is currently admitted to [facility]M following a cardiac arrest. Around 10am this morning she developed left-sided chest pain described as an aching sensation.   She was last evaluated by cardiology during an admission to the hospital in 2019 for chest pain. She had negative trop x2. EKG showed ST wit TWI of inferolateral leads appears new.  Echocardiogram which showed EF 60-65%, no RWMA, G1DD, elevated left atrial pressure,  mild ascending aortic aneurysm (20m), and no significant valvular abnormalities. She had subsequent ischemic evaluation with Myoveiw on 03/27/2018 which showed low risk and no reversible ischemia. She has not followed up with cardiology since 2019.   She presented to ER today with c/o of chest pain. Per ER notes , she was visiting her ill daughter here in the ICU. She developed sudden onset of left sided aching chest pan, non-radiating, at 10/10 on a scale. She endorses associated SOB. She was directed to ER for evaluation.   She is a poor historian, had underlying dementia, she can't elaborate on history, not oriented to place/time. She brings up off topics frequently. Staff RN states she had an episode with increased confusion, agitation, pulled out all her IV and took off her hospital gown earlier. She states her pain is located at left lower abdomen and underneath her left breast , radiating to mid-sternum area. She does not know how long the pain lasted. She  had similar pain episodes 1-2 weeks ago, she had to grab her abdominal skin folds which seems help a little. She reports SOB when she feels bad, dizzy for the past 2 days. She states all her meds but unaware what they are. She states she lives with her husband who is at work.  She denied any fever, chills, nausea, emesis, diarrhea, headache. She is currently pain free at ED.   Admission diagnostics showed mildly elevated Cr 1.19 with GFR 47. CBC unremarkable. Hs Trop negative x2. CXR showed no cute findings. EKG showed sinus bradycardia 54 bpm, LBBB appears new. She is bradycardia with HR 40-50s, and hypertensive with BP up to 215/110 at ED. Cardiology is asked to see the patient.     Past Medical History:  Diagnosis Date   Arthritis    Benign essential HTN 02/18/2015   Cellulitis 02/18/2015   Chicken pox    Chronic fatigue    Dementia ([facility]    Depression    since last 04/2018 daughter passed.     Diabetes mellitus [place] complication [place]    Diabetic peripheral neuropathy ([facility])    GERD (gastroesophageal reflux disease)    Hypercholesteremia    Hypertension    Liver disease    Polyp of colon     Past Surgical History:  Procedure Laterality Date   APPENDECTOMY     HERNIA REPAIR     KNEE SURGERY     ROTATOR CUFF REPAIR     TONSILLECTOMY       Home Medications:  Prior to Admission medications   Medication Sig Start Date End Date Taking? Authorizing Provider  acetaZOLAMIDE (DIAMOX) 250 MG tablet TAKE 1 TABLET BY MOUTH TWICE A DAY Patient taking differently: Take 250 mg by mouth 2 (two) times daily. 02/22/19  Yes Saguier, Percell Miller, PA-C  amLODipine (NORVASC) 10 MG tablet TAKE 1 TABLET BY MOUTH EVERY DAY Patient taking differently: Take 10 mg by mouth daily. 10/16/20  Yes Saguier, Iris Pert  aspirin EC 81 MG tablet Take 81 mg by mouth daily.   Yes [provider]  atorvastatin (LIPITOR) 20 MG tablet TAKE 1 TABLET BY MOUTH EVERY DAY Patient taking differently: Take 20 mg by  mouth daily. 10/16/20  Yes Saguier, Percell Miller, PA-C  blood glucose meter kit and supplies Dispense based on patient and insurance preference. Use up to four times daily as directed. (FOR ICD-10 E10.9, E11.9). 04/01/19  Yes Saguier, Percell Miller, PA-C  Blood Glucose Monitoring Suppl (ONE TOUCH ULTRA 2) w/Device KIT Use to check blood sugars twice a day Dx E11.9 09/01/17  Yes Marrian Salvage, FNP  Cholecalciferol (VITAMIN D-3) 1000 UNITS CAPS Take 1,000 Units by mouth.   Yes [provider]  cloNIDine (CATAPRES) 0.1 MG tablet Take 1 tablet (0.1 mg total) by mouth 2 (two) times daily. 10/11/18  Yes Saguier, Percell Miller, PA-C  cyanocobalamin 1000 MCG tablet Take 1,000 mcg by mouth daily.   Yes [provider]  esomeprazole (NEXIUM) 40 MG capsule TAKE 1 CAPSULE (40 MG TOTAL) BY MOUTH DAILY AT 12 NOON. 10/16/20  Yes Saguier, Percell Miller, PA-C  famotidine (PEPCID) 20 MG tablet TAKE 1 TABLET BY MOUTH TWICE A DAY 12/08/18  Yes Saguier, Percell Miller, PA-C  FREESTYLE LITE test strip USE AS DIRECTED UP TO 4 TIMES DAILY Patient taking differently: 1 each by Other route See admin instructions. USE AS DIRECTED UP TO 4 TIMES DAILY 05/31/19  Yes Saguier, Percell Miller, PA-C  gabapentin (NEURONTIN) 300 MG capsule Take 1 capsule (300 mg total) by mouth 3 (three) times daily. TAKE 1 CAPSULE BY MOUTH THREE TIMES A DAY 05/17/18  Yes Shambley, Ashleigh N, NP  JANUVIA 100 MG tablet TAKE 1 TABLET BY MOUTH EVERY DAY Patient taking differently: Take 100 mg by mouth daily. 05/02/19  Yes Saguier, Percell Miller, PA-C  KLOR-CON M10 10 MEQ tablet Take 10 mEq by mouth 2 (two) times daily. 10/19/20  Yes [provider]  Lancets (ONETOUCH ULTRASOFT) lancets USE TO HELP CHECK BLOOD SUGARS TWICE A DAY E11.9 11/30/17  Yes Shambley, Ashleigh N, NP  lisinopril (ZESTRIL) 20 MG tablet Take 20 mg by mouth daily. 12/27/20  Yes [provider]  meclizine (ANTIVERT) 25 MG tablet TAKE 1/2 TO 1 TABLET BY MOUTH TWICE A DAY AS NEEDED FOR DIZZINESS 02/21/20   Yes Saguier, Percell Miller, PA-C  meloxicam (MOBIC) 7.5 MG tablet TAKE 1 TABLET BY MOUTH EVERY DAY 11/23/19  Yes Saguier, Percell Miller, PA-C  metoprolol succinate (TOPROL-XL) 25 MG 24 hr tablet Take 1 tablet (25 mg total) by mouth daily. 03/04/19  Yes Saguier, Percell Miller, PA-C  Multiple Vitamin (MULTIVITAMIN) tablet Take 1 tablet by mouth daily.   Yes [provider]  omeprazole (PRILOSEC) 20 MG capsule Take 20 mg by mouth daily. 11/20/20  Yes [provider]  tiZANidine (ZANAFLEX) 2 MG tablet Take 2 mg by mouth every 6 (six) hours as needed for muscle spasms. 12/28/20  Yes [provider]  methocarbamol (ROBAXIN) 500 MG tablet Take 1 tablet (500 mg total) by mouth 2 (two) times daily. Patient not taking: No sig reported  04/16/19   Lucrezia Starch, MD  Potassium Chloride 40 MEQ/15ML (20%) SOLN Take 40 mg by mouth daily for 7 days. 05/03/19 05/10/19  Dorie Rank, MD  predniSONE (DELTASONE) 5 MG tablet Take 6 pills for first day, 5 pills second day, 4 pills third day, 3 pills fourth day, 2 pills the fifth day, and 1 pill sixth day. Patient not taking: No sig reported 03/29/19   Rosemarie Ax, MD  terconazole (TERAZOL 3) 0.8 % vaginal cream Place 1 applicator vaginally at bedtime. Patient not taking: No sig reported 09/22/18   Mackie Pai, PA-C    Inpatient Medications: Scheduled Meds:  Continuous Infusions:  PRN Meds:   Allergies:    Allergies  Allergen Reactions   Codeine Other (See Comments)    Woke up the next morning and did not know where she was   Januvia [Sitagliptin] Itching   Other     Doesn't like to take pain medication due to hallucinations.   Shellfish Allergy     Social History:   Social History   Socioeconomic History   Marital status: Married    Spouse name: Not on file   Number of children: 5   Years of education: 12   Highest education level: Not on file  Occupational History   Occupation: Retired  Tobacco Use   Smoking status: Never    Smokeless tobacco: Never  Substance and Sexual Activity   Alcohol use: No   Drug use: No   Sexual activity: Not on file  Other Topics Concern   Not on file  Social History Narrative   Fun: paint, word search   Feels safe where she is living and no concern for abuse   Denies religious beliefs effecting healthcare.    Social Determinants of Health   Financial Resource Strain: Not on file  Food Insecurity: Not on file  Transportation Needs: Not on file  Physical Activity: Not on file  Stress: Not on file  Social Connections: Not on file  Intimate Partner Violence: Not on file    Family History:    Family History  Problem Relation Age of Onset   Healthy Mother    Healthy Father    Healthy Paternal Grandmother    Healthy Paternal Grandfather      ROS:  .Constitutional: Denied fever, chills, malaise, night sweats Eyes: Denied vision change or loss Ears/Nose/Mouth/Throat: Denied ear ache, sore throat, coughing, sinus pain Cardiovascular: see HPI  Respiratory: chronic intermittent  shortness of breath Gastrointestinal: Denied nausea, vomiting, abdominal pain, diarrhea Genital/Urinary: Denied dysuria, hematuria, urinary frequency/urgency Musculoskeletal: Denied muscle ache, joint pain, weakness Skin: Denied rash, wound Neuro: Denied headache, dizziness, syncope Psych: history of depression/anxiety  Endocrine: history of diabetes   Physical Exam/Data:   Vitals:   01/02/21 1605 01/02/21 1700 01/02/21 1737 01/02/21 1740  BP: (!) 157/72 (!) 160/77 (!) 176/98   Pulse: (!) 45 (!) 56    Resp: _0 SpO2: 100% 98%      Intake/Output Summary (Last 24 hours) at 01/02/2021 1823 Last data filed at 01/02/2021 1456 Gross per 24 hour  Intake 500 ml  Output --  Net 500 ml   Last 3 Weights 05/03/2019 04/16/2019 04/01/2019  Weight (lbs) 114 lb 141 lb 140 lb 3.2 oz  Weight (kg) 51.71 kg 63.957 kg 63.594 kg     There is no height or weight on file to calculate BMI.  Vitals:   Vitals:   01/02/21 1737 01/02/21 1740  BP: [name])  176/98   Pulse:    Resp:  14  SpO2:     General Appearance: In no apparent distress, laying in bed, confused  HEENT: Normocephalic, atraumatic.  Neck: Supple, trachea midline, no JVD Cardiovascular: Regular rate and rhythm, normal S1-S2,  no murmur/rub/gallop Respiratory: Resting breathing unlabored, lungs sounds clear to auscultation bilaterally, no use of accessory muscles. On room air.  No wheezes, rales or rhonchi.   Gastrointestinal: Bowel sounds positive, abdomen soft, non-tender, non-distended.  Extremities: Able to move all extremities in bed without difficulty, no edema/cyanosis/clubbing Genitourinary: genital exam not performed Musculoskeletal: Normal muscle bulk and tone, muscle strength 5/5 throughout Skin: Intact, warm, dry. No rashes or petechiae noted in exposed areas.  Neurologic: Alert, oriented to self only, confused, + cognitive deficit, able to answer yes and no question and follow one step commands. Fluent speech,  no cognitive deficit, no gross focal neuro deficit Psychiatric: Normal affect. Mood is appropriate.    EKG:  The EKG was personally reviewed and demonstrates:  EKG with sinus bradycardia 54 bpm, LBBB appears new   Telemetry:  Telemetry was personally reviewed and demonstrates:  Sinu bradycardiac 50-60s, LBBB  Relevant CV Studies:  Echocardiogram 03/26/18: Study Conclusions   - Left ventricle: The cavity size was normal. Wall thickness was   increased in a pattern of mild LVH. There was moderate focal   basal hypertrophy of the septum. Systolic function was normal.   The estimated ejection fraction was in the range of 60% to 65%.   Wall motion was normal; there were no regional wall motion   abnormalities. Doppler parameters are consistent with abnormal   left ventricular relaxation (grade 1 diastolic dysfunction). The   E/e&' ratio is >15, suggesting elevated LV filling pressure. - Aortic valve:  Trileaflet. Sclerosis without stenosis. - Aorta: Calcified aortic root and annulus. Ascending aortic   diameter: 41 mm (S). - Ascending aorta: The ascending aorta was mildly dilated. - Mitral valve: Calcification of the AMVL. Trace to mild   regurgitation. - Left atrium: The atrium was normal in size. - Tricuspid valve: There was trivial regurgitation. - Pulmonary arteries: PA peak pressure: 42 mm Hg (S). - Inferior vena cava: The vessel was dilated. The respirophasic   diameter changes were blunted (< 50%), consistent with elevated   central venous pressure.   Impressions:   - LVEF 60-65%, mild LVH, moderate focal basal septal hypertrophy,   normal wall motion, grade 1 DD, elevated LV filling pressure,   aortic valve sclerosis, dilated ascending aorta to 4.1 cm,   calcified mitral valve leaflets with trace to mild regurgitation,   normal biatrial size, trivial TR, RVSP 42 mmHg, dilated IVC.  NST 2019: 1. No reversible ischemia or infarction.   2. Normal left ventricular wall motion.   3. Left ventricular ejection fraction 69%   4. Non invasive risk stratification*: Low   Laboratory Data:  High Sensitivity Troponin:   Recent Labs  Lab 01/02/21 1156 01/02/21 1341  TROPONINIHS 7 6     Chemistry Recent Labs  Lab 01/02/21 1156  NA 139  K 3.6  CL 105  CO2 25  GLUCOSE 105*  BUN 8  CREATININE 1.19*  CALCIUM 10.1  GFRNONAA 47*  ANIONGAP 9    Recent Labs  Lab 01/02/21 1354  PROT 7.7  ALBUMIN 3.9  AST 16  ALT 11  ALKPHOS 61  BILITOT 1.0   Hematology Recent Labs  Lab 01/02/21 1156  WBC 5.8  RBC 4.59  HGB 13.0  HCT 41.4  MCV 90.2  MCH 28.3  MCHC 31.4  RDW 13.0  PLT 325   BNPNo results for input(s): BNP, PROBNP in the last 168 hours.  DDimer No results for input(s): DDIMER in the last 168 hours.   Radiology/Studies:  DG Chest 2 View  Result Date: 01/02/2021 CLINICAL DATA:  Chest pain EXAM: CHEST - 2 VIEW COMPARISON:  10/18/2020 FINDINGS: The  heart size and mediastinal contours are within normal limits. Atherosclerotic calcification of the aortic knob. No focal airspace consolidation, pleural effusion, or pneumothorax. The visualized skeletal structures are unremarkable. IMPRESSION: No active cardiopulmonary disease. Electronically Signed   By: Davina Poke D.O.   On: 01/02/2021 13:19     Assessment and Plan:   Chest pain - presented with chest pain and SOB, while visiting sick daughter in ICU here - HS trop negative x2 - EKG with sinus bradycardia , new LBBB, similar to previous morphology  - Myoveiw from 2019 low risk and no reversible ischemia  - suspect stress/anxiety and hypertensive urgency related, not ACS - continue medical therapy ASA 2m, lipitor 236m would reduce metoprolol to 12.106m80mL daily given bradycardia  - if symptoms recurrent, can consider repeat outpatient stress test  - will check Echo rule out stress induced CM   Sinus bradycardia  - asymptomatic, on metoprolol 2106m52m , will reduce to 12.106mg 54mdaily   HTN urgency  - on amlodipine 10mg 101my, diamox 250mg B36mclonidine 0.1mg BID32misinopril 20mg dai74mmetoprolol XL 2106mg at h54mfor HTN, BP markly elevated at ED, now improving , need resume home meds and monitor trend  - can be stress related   Confusion - unclear if this is new onset, need baseline mentation status clarified from family  - has dementia in hx documentation - consider rule out infection if indicated   Type 2 DM CKD stage III GERD - addressed by IM    Risk Assessment/Risk Scores:   HEAR Score (for undifferentiated chest pain):  HEAR Score: 4{         For questions or updates, please contact [facility] [email or web address] for contact info under    Signed, Brean Carberry,Margie Billet/2022 6:23 PM

## 2021-01-02 NOTE — H&P (Signed)
History and Physical    Meredith Phillips FKC:[id number] DOB: 18-Jun-1945 DOA: 01/02/2021  PCP: Pcp, No  Patient coming from: Home.  Chief Complaint: Chest pain.  HPI: Meredith Phillips is a 75 y.o. female with history of hypertension, diabetes mellitus, hyperlipidemia was brought to the ER after patient was complaining of chest pain.  Patient came to visit her daughter was admitted to the ICU and on visiting with her daughter patient started developing left-sided chest pain which was stabbing in nature last for around 4 to 5 minutes with no associated shortness of breath.  Patient states she has been getting chest pain off and on at least once or twice a month for the last 1 year.  Pain today was radiating across the chest when it happened.  ED Course: In the ER patient blood pressure was more than 749 systolic and patient was bradycardic with heart rate around 50s EKG showing sinus bradycardia with LBBB.  Cardiology was consulted patient's troponins were negative.  Chest x-ray unremarkable COVID test pending.  Patient has had a stress test in 2019 which was unremarkable.  At this time cardiology wants to admit for further observation and get 2D echo to make sure there is no cardiomyopathy related to stress.  Review of Systems: As per HPI, rest all negative.   Past Medical History:  Diagnosis Date   Arthritis    Benign essential HTN 02/18/2015   Cellulitis 02/18/2015   Chicken pox    Chronic fatigue    Dementia ([place])    Depression    since last 04/2018 daughter passed.     Diabetes mellitus without complication ([place])    Diabetic peripheral neuropathy ([facility])    GERD (gastroesophageal reflux disease)    Hypercholesteremia    Hypertension    Liver disease    Polyp of colon     Past Surgical History:  Procedure Laterality Date   APPENDECTOMY     HERNIA REPAIR     KNEE SURGERY     ROTATOR CUFF REPAIR     TONSILLECTOMY       reports that she has never smoked. She has never used  smokeless tobacco. She reports that she does not drink alcohol and does not use drugs.  Allergies  Allergen Reactions   Codeine Other (See Comments)    Woke up the next morning and did not know where she was   Januvia [Sitagliptin] Itching   Other     Doesn't like to take pain medication due to hallucinations.   Shellfish Allergy     Family History  Problem Relation Age of Onset   Healthy Mother    Healthy Father    Healthy Paternal Grandmother    Healthy Paternal Grandfather     Prior to Admission medications   Medication Sig Start Date End Date Taking? Authorizing Provider  acetaZOLAMIDE (DIAMOX) 250 MG tablet TAKE 1 TABLET BY MOUTH TWICE A DAY Patient taking differently: Take 250 mg by mouth 2 (two) times daily. 02/22/19  Yes Saguier, Percell Miller, PA-C  amLODipine (NORVASC) 10 MG tablet TAKE 1 TABLET BY MOUTH EVERY DAY Patient taking differently: Take 10 mg by mouth daily. 10/16/20  Yes Saguier, Iris Pert  aspirin EC 81 MG tablet Take 81 mg by mouth daily.   Yes [provider]  atorvastatin (LIPITOR) 20 MG tablet TAKE 1 TABLET BY MOUTH EVERY DAY Patient taking differently: Take 20 mg by mouth daily. 10/16/20  Yes Saguier, Percell Miller, PA-C  blood glucose meter kit  and supplies Dispense based on patient and insurance preference. Use up to four times daily as directed. (FOR ICD-10 E10.9, E11.9). 04/01/19  Yes Saguier, Percell Miller, PA-C  Blood Glucose Monitoring Suppl (ONE TOUCH ULTRA 2) w/Device KIT Use to check blood sugars twice a day Dx E11.9 09/01/17  Yes Marrian Salvage, FNP  Cholecalciferol (VITAMIN D-3) 1000 UNITS CAPS Take 1,000 Units by mouth.   Yes [provider]  cloNIDine (CATAPRES) 0.1 MG tablet Take 1 tablet (0.1 mg total) by mouth 2 (two) times daily. 10/11/18  Yes Saguier, Percell Miller, PA-C  cyanocobalamin 1000 MCG tablet Take 1,000 mcg by mouth daily.   Yes [provider]  esomeprazole (NEXIUM) 40 MG capsule TAKE 1 CAPSULE (40 MG TOTAL) BY MOUTH DAILY  AT 12 NOON. 10/16/20  Yes Saguier, Percell Miller, PA-C  famotidine (PEPCID) 20 MG tablet TAKE 1 TABLET BY MOUTH TWICE A DAY 12/08/18  Yes Saguier, Percell Miller, PA-C  FREESTYLE LITE test strip USE AS DIRECTED UP TO 4 TIMES DAILY Patient taking differently: 1 each by Other route See admin instructions. USE AS DIRECTED UP TO 4 TIMES DAILY 05/31/19  Yes Saguier, Percell Miller, PA-C  gabapentin (NEURONTIN) 300 MG capsule Take 1 capsule (300 mg total) by mouth 3 (three) times daily. TAKE 1 CAPSULE BY MOUTH THREE TIMES A DAY 05/17/18  Yes Shambley, Ashleigh N, NP  JANUVIA 100 MG tablet TAKE 1 TABLET BY MOUTH EVERY DAY Patient taking differently: Take 100 mg by mouth daily. 05/02/19  Yes Saguier, Percell Miller, PA-C  KLOR-CON M10 10 MEQ tablet Take 10 mEq by mouth 2 (two) times daily. 10/19/20  Yes [provider]  Lancets (ONETOUCH ULTRASOFT) lancets USE TO HELP CHECK BLOOD SUGARS TWICE A DAY E11.9 11/30/17  Yes Shambley, Ashleigh N, NP  lisinopril (ZESTRIL) 20 MG tablet Take 20 mg by mouth daily. 12/27/20  Yes [provider]  meclizine (ANTIVERT) 25 MG tablet TAKE 1/2 TO 1 TABLET BY MOUTH TWICE A DAY AS NEEDED FOR DIZZINESS 02/21/20  Yes Saguier, Percell Miller, PA-C  meloxicam (MOBIC) 7.5 MG tablet TAKE 1 TABLET BY MOUTH EVERY DAY 11/23/19  Yes Saguier, Percell Miller, PA-C  metoprolol succinate (TOPROL-XL) 25 MG 24 hr tablet Take 1 tablet (25 mg total) by mouth daily. 03/04/19  Yes Saguier, Percell Miller, PA-C  Multiple Vitamin (MULTIVITAMIN) tablet Take 1 tablet by mouth daily.   Yes [provider]  omeprazole (PRILOSEC) 20 MG capsule Take 20 mg by mouth daily. 11/20/20  Yes [provider]  tiZANidine (ZANAFLEX) 2 MG tablet Take 2 mg by mouth every 6 (six) hours as needed for muscle spasms. 12/28/20  Yes [provider]  methocarbamol (ROBAXIN) 500 MG tablet Take 1 tablet (500 mg total) by mouth 2 (two) times daily. Patient not taking: No sig reported 04/16/19   Lucrezia Starch, MD  Potassium Chloride 40 MEQ/15ML  (20%) SOLN Take 40 mg by mouth daily for 7 days. 05/03/19 05/10/19  Dorie Rank, MD  predniSONE (DELTASONE) 5 MG tablet Take 6 pills for first day, 5 pills second day, 4 pills third day, 3 pills fourth day, 2 pills the fifth day, and 1 pill sixth day. Patient not taking: No sig reported 03/29/19   Rosemarie Ax, MD  terconazole (TERAZOL 3) 0.8 % vaginal cream Place 1 applicator vaginally at bedtime. Patient not taking: No sig reported 09/22/18   Mackie Pai, PA-C    Physical Exam: Constitutional: Moderately built and nourished. Vitals:   01/02/21 1850 01/02/21 1920 01/02/21 1925 01/02/21 1929  BP: [name])  207/103 (!) 148/77  (!) 148/77  Pulse:    (!) 59  Resp: 15   12  Temp:    98.4 F (36.9 C)  TempSrc:    Oral  SpO2:    100%  Weight:   68 kg   Height:   _0  (1.6 m)    Eyes: Anicteric no pallor. ENMT: No discharge from the ears eyes nose and mouth. Neck: No mass felt.  No neck rigidity. Respiratory: No rhonchi or crepitations. Cardiovascular: S1-S2 heard. Abdomen: Soft nontender bowel sound present. Musculoskeletal: No edema. Skin: No rash. Neurologic: Alert awake oriented to time place and person.  Moves all extremities. Psychiatric: Appears normal.  Normal affect.   Labs on Admission: I have personally reviewed following labs and imaging studies  CBC: Recent Labs  Lab 01/02/21 1156  WBC 5.8  NEUTROABS 2.3  HGB 13.0  HCT 41.4  MCV 90.2  PLT 702   Basic Metabolic Panel: Recent Labs  Lab 01/02/21 1156  NA 139  K 3.6  CL 105  CO2 25  GLUCOSE 105*  BUN 8  CREATININE 1.19*  CALCIUM 10.1   GFR: Estimated Creatinine Clearance: 36.6 mL/min (A) (by C-G formula based on SCr of 1.19 mg/dL (H)). Liver Function Tests: Recent Labs  Lab 01/02/21 1354  AST 16  ALT 11  ALKPHOS 61  BILITOT 1.0  PROT 7.7  ALBUMIN 3.9   No results for input(s): LIPASE, AMYLASE in the last 168 hours. No results for input(s): AMMONIA in the last 168 hours. Coagulation  Profile: No results for input(s): INR, PROTIME in the last 168 hours. Cardiac Enzymes: No results for input(s): CKTOTAL, CKMB, CKMBINDEX, TROPONINI in the last 168 hours. BNP (last 3 results) No results for input(s): PROBNP in the last 8760 hours. HbA1C: No results for input(s): HGBA1C in the last 72 hours. CBG: No results for input(s): GLUCAP in the last 168 hours. Lipid Profile: No results for input(s): CHOL, HDL, LDLCALC, TRIG, CHOLHDL, LDLDIRECT in the last 72 hours. Thyroid Function Tests: No results for input(s): TSH, T4TOTAL, FREET4, T3FREE, THYROIDAB in the last 72 hours. Anemia Panel: No results for input(s): VITAMINB12, FOLATE, FERRITIN, TIBC, IRON, RETICCTPCT in the last 72 hours. Urine analysis:    Component Value Date/Time   COLORURINE STRAW (A) 05/03/2019 1112   APPEARANCEUR CLEAR 05/03/2019 1112   APPEARANCEUR Cloudy (A) 04/26/2019 1435   LABSPEC 1.004 (L) 05/03/2019 1112   PHURINE 7.0 05/03/2019 1112   GLUCOSEU NEGATIVE 05/03/2019 1112   GLUCOSEU NEGATIVE 04/02/2018 1346   HGBUR NEGATIVE 05/03/2019 1112   BILIRUBINUR NEGATIVE 05/03/2019 1112   BILIRUBINUR Negative 04/26/2019 1435   KETONESUR 5 (A) 05/03/2019 1112   PROTEINUR 100 (A) 05/03/2019 1112   UROBILINOGEN 0.2 04/01/2019 1501   UROBILINOGEN 1.0 04/02/2018 1346   NITRITE NEGATIVE 05/03/2019 1112   LEUKOCYTESUR NEGATIVE 05/03/2019 1112   Sepsis Labs: _1 (procalcitonin:4,lacticidven:4) )No results found for this or any previous visit (from the past 240 hour(s)).   Radiological Exams on Admission: DG Chest 2 View  Result Date: 01/02/2021 CLINICAL DATA:  Chest pain EXAM: CHEST - 2 VIEW COMPARISON:  10/18/2020 FINDINGS: The heart size and mediastinal contours are within normal limits. Atherosclerotic calcification of the aortic knob. No focal airspace consolidation, pleural effusion, or pneumothorax. The visualized skeletal structures are unremarkable. IMPRESSION: No active cardiopulmonary disease.  Electronically Signed   By: Davina Poke D.O.   On: 01/02/2021 13:19    EKG: Independently reviewed.  Sinus bradycardia heart rate around 50s with LBBB.  Assessment/Plan Principal Problem:   Chest pain Active Problems:   Hypertensive urgency   Diabetes mellitus type 2 in nonobese [facility])    Chest pain -presently chest pain-free but appears atypical.  Cardiac markers negative.  Cardiology planning to get a 2D echo to make sure there is no stress related cardiomyopathy.  Stress test done in 2019 was unremarkable. Hypertensive urgency likely contributing to chest pain.  Patient's home medication with Norvasc lisinopril clonidine has been added.  Toprol dose was decreased because of the bradycardia.  #The time of my exam patient's heart rate is improved to 64.  If bradycardia persist may have to also hold clonidine.  As needed IV hydralazine for systolic blood pressure more than 160.  Note that patient's creatinine is mildly elevated from her baseline.  We will be follow metabolic panel closely since patient is on lisinopril. Diabetes mellitus type 2 we will keep patient on sliding scale coverage. Hyperlipidemia on statins.  COVID test is pending.   DVT prophylaxis: Lovenox. Code Status: Full code. Family Communication: Discussed with patient. Disposition Plan: Home. Consults called: Cardiology. Admission status: Observation.   Rise Patience MD [facility] Pager [phone].  If 7PM-7AM, please contact night-coverage [email or web address] Password [facility]  01/02/2021, 7:57 PM

## 2021-01-02 NOTE — Progress Notes (Signed)
Called for chest pain observation.  Meredith Phillips is a 75 y.o. female with medical history significant of HTN; dementia; depression; chronic fatigue; DM; HTN; and HLD presenting with chest pain while visiting her daughter, who is in [facility].  Lasted 2 hours, resolved.  Heart score 5.  HS troponin negative x 2.  EKG with nonspecific changes.  Negative stress test in May.  Based on this information, I have encouraged cardiology consultation in the ER with probable outpatient follow-up.  If TRH observation is requested later, please re-page for consultation at that time.   Georgana Curio, M.D.

## 2021-01-02 NOTE — ED Notes (Signed)
Tech from [facility] that brought patient down to ED states call [phone] when patient is discharged and he will return the patient to her daughter's room.

## 2021-01-02 NOTE — ED Triage Notes (Signed)
Patient here with complaint of chest pain and left arm numbness while visiting daughter who is admitted to [facility]. Patient states she has felt similar pain in past, states it was either a heart attack or stroke. Patient alert, oriented, and in no apparent distress at this time.

## 2021-01-03 ENCOUNTER — Observation Stay (HOSPITAL_BASED_OUTPATIENT_CLINIC_OR_DEPARTMENT_OTHER): Payer: Medicare Other

## 2021-01-03 ENCOUNTER — Observation Stay (HOSPITAL_COMMUNITY): Payer: Medicare Other

## 2021-01-03 DIAGNOSIS — I259 Chronic ischemic heart disease, unspecified: Secondary | ICD-10-CM

## 2021-01-03 DIAGNOSIS — I160 Hypertensive urgency: Secondary | ICD-10-CM | POA: Diagnosis not present

## 2021-01-03 DIAGNOSIS — I495 Sick sinus syndrome: Secondary | ICD-10-CM | POA: Diagnosis not present

## 2021-01-03 DIAGNOSIS — R079 Chest pain, unspecified: Secondary | ICD-10-CM

## 2021-01-03 LAB — CBG MONITORING, ED: Glucose-Capillary: 109 mg/dL — ABNORMAL HIGH (ref 70–99)

## 2021-01-03 LAB — ECHOCARDIOGRAM COMPLETE
Area-P 1/2: 2.1 cm2
Calc EF: 60.6 %
Height: 63 in
S' Lateral: 2.9 cm
Single Plane A2C EF: 63.7 %
Single Plane A4C EF: 58 %
Weight: 2400 oz

## 2021-01-03 LAB — CBC
HCT: 38.1 % (ref 36.0–46.0)
Hemoglobin: 12.1 g/dL (ref 12.0–15.0)
MCH: 29.1 pg (ref 26.0–34.0)
MCHC: 31.8 g/dL (ref 30.0–36.0)
MCV: 91.6 fL (ref 80.0–100.0)
Platelets: 244 10*3/uL (ref 150–400)
RBC: 4.16 MIL/uL (ref 3.87–5.11)
RDW: 13 % (ref 11.5–15.5)
WBC: 6 10*3/uL (ref 4.0–10.5)
nRBC: 0 % (ref 0.0–0.2)

## 2021-01-03 LAB — BASIC METABOLIC PANEL
Anion gap: 9 (ref 5–15)
BUN: 7 mg/dL — ABNORMAL LOW (ref 8–23)
CO2: 23 mmol/L (ref 22–32)
Calcium: 9.6 mg/dL (ref 8.9–10.3)
Chloride: 107 mmol/L (ref 98–111)
Creatinine, Ser: 1.04 mg/dL — ABNORMAL HIGH (ref 0.44–1.00)
GFR, Estimated: 55 mL/min — ABNORMAL LOW (ref >60)
Glucose, Bld: 92 mg/dL (ref 70–99)
Potassium: 3.2 mmol/L — ABNORMAL LOW (ref 3.5–5.1)
Sodium: 139 mmol/L (ref 135–145)

## 2021-01-03 MED ORDER — POTASSIUM CHLORIDE CRYS ER 20 MEQ PO TBCR
40.0000 meq | EXTENDED_RELEASE_TABLET | Freq: Every day | ORAL | Status: DC
  Administered 2021-01-03: 40 meq via ORAL
  Filled 2021-01-03: qty 2

## 2021-01-03 MED ORDER — CARVEDILOL 12.5 MG PO TABS: 12.5000 mg | ORAL_TABLET | Freq: Two times a day (BID) | ORAL | 3 refills | Status: AC

## 2021-01-03 MED ORDER — CARVEDILOL 12.5 MG PO TABS: 12.5000 mg | ORAL_TABLET | Freq: Two times a day (BID) | ORAL | Status: DC

## 2021-01-03 NOTE — Hospital Course (Signed)
75 year old community dwelling black female Prior admission 03/25/2018 atypical chest pain troponins negative stress test low risk discharged with atypical chest pain?-At that time work-up for PE DVT-chest pain felt at that time to be musculoskeletal-nuclear stress at the time grossly negative Known ascending aortic aneurysm -EF 60-65% DM TY 2 Came to [facility], ICU to visit daughter who was admitted there-developed L chest pain stabbing nature 45 minutes no SOB-for the past year per report intermittent chest tightness with radiation across chest EKG = sinus bradycardia with new LBBB Troponins   were negative Medications on admission adjusted cardiology consulted   Potassium 3.2 Echo pending

## 2021-01-03 NOTE — ED Notes (Signed)
Pt ambulated to the bathroom with daughters at side for safety. Gait even and steady. Pt then came to nurses desk with daughter requesting her morning bath. Explained that we could provide washcloths to her for but a bathtub is not available in the ED. Pt then stated that she is ready to go home. Explained that pt will be admitted to hospital. Pt then agreed and went back into room.

## 2021-01-03 NOTE — ED Notes (Signed)
Breakfast Ordered 

## 2021-01-03 NOTE — ED Notes (Signed)
Echo at bedside at this time

## 2021-01-03 NOTE — Discharge Summary (Signed)
Physician Discharge Summary  Meredith Phillips EZM:[id number] DOB: June 24, 1945 DOA: 01/02/2021  PCP: Pcp, No  Admit date: 01/02/2021 Discharge date: 01/03/2021  Time spent: 27 minutes  Recommendations for Outpatient Follow-up:  Note dosage changes of various medications-discontinue clonidine-metoprolol substituted for Coreg as per cardiology Needs outpatient PCP set up by case management in addition to home health given mild cognitive issues seen this a.m. Recommend Chem-12 CBC in 1 week and further preventive care in the outpatient setting  Discharge Diagnoses:  MAIN problem for hospitalization   Chest pain-noncardiac  Please see below for itemized issues addressed in HOpsital- refer to other progress notes for clarity if needed  Discharge Condition: Improved  Diet recommendation: Heart healthy  Filed Weights   01/02/21 1925  Weight: 68 kg    History of present illness:  75 year old community dwelling black female Prior admission 03/25/2018 atypical chest pain troponins negative stress test low risk discharged with atypical chest pain?-At that time work-up for PE DVT-chest pain felt at that time to be musculoskeletal-nuclear stress at the time grossly negative Known ascending aortic aneurysm -EF 60-65% DM TY 2 Came to [facility] ICU to visit daughter who was admitted there-developed L chest pain stabbing nature 45 minutes no SOB-for the past year per report intermittent chest tightness with radiation across chest EKG = sinus bradycardia with new LBBB Troponins   were negative Medications on admission adjusted cardiology consulted  Patient was admitted and had echo that was performed as below showing no specific wall motion abnormalities no Takotsubo syndrome Medications were adjusted by cardiology secondary to mild bradycardia It was noted that patient seemed a little confused about her meds and could not tell me what she was taking-she does not have a current primary care  physician and is in the process of finding a new one-I will asked that home health nursing go out and do med planning with her to ensure that she is not lost to follow-up and that she is taking her meds correctly  She stabilized maximally during her short hospital stay for discharge home and was discharged in a stable state   Procedures: Echocardiogram 01/03/2021 impressions:   - LVEF 60-65%, mild LVH, moderate focal basal septal hypertrophy,    normal wall motion, grade 1 DD, elevated LV filling pressure,    aortic valve sclerosis, dilated ascending aorta to 4.1 cm,    calcified mitral valve leaflets with trace to mild regurgitation,    normal biatrial size, trivial TR, RVSP 42 mmHg, dilated IVC.  (i.e. Studies not automatically included, echos, thoracentesis, etc; not x-rays)  Consultations: Cardiology  Discharge Exam: Vitals:   01/03/21 0838 01/03/21 1018  BP: (!) 147/74 126/74  Pulse: 64   Resp: 17   Temp: 97.9 F (36.6 C)   SpO2: 99%     Subj on day of d/c   Awake pleasant slightly confused at times Very poor historian cannot give a proper sequence of events leading up to her being admitted does not know what happened with her chest pain Cannot tell me if she received any medications for Daughter is at the bedside and seems equally unaware of events She currently has no chest pain no nausea no fever no chills She is able to sit up in the bed quite comfortably  General Exam on discharge  EOMI NCAT no focal deficit no JVD neck soft supple S1-S2 sinus rhythm Chest clear no rales rhonchi Abdomen soft no rebound No lower extremity edema ROM intact power 5/5 grossly full  neuro exam deferred Psych euthymic however at times somewhat confused  Discharge Instructions   Discharge Instructions     Diet - low sodium heart healthy   Complete by: As directed    Discharge instructions   Complete by: As directed    You came in with chest pain of unknown cause-cardiology saw  you and did not think that you had any new heart problem-we think that the chest pain you had came from the stress of being in the hospital seeing your daughter who was sick Your echocardiogram did not show anything specific You will continue your meds with the changes that we have described below Please do not take any over-the-counter medications unless this is approved by Dr. Because you have transitioned from your prior physician assistant we will asked that the care manager come and talk to you about getting a new primary care doctor You will need labs in about 1 week to 2 weeks in the outpatient setting and close follow-up You may need testing for your memory in the outpatient setting and we will ask home health come out to ensure that you are taking her meds correctly   Increase activity slowly   Complete by: As directed       Allergies as of 01/03/2021       Reactions   Codeine Other (See Comments)   Woke up the next morning and did not know where she was   Januvia [sitagliptin] Itching   Other    Doesn't like to take pain medication due to hallucinations.   Shellfish Allergy         Medication List     STOP taking these medications    acetaZOLAMIDE 250 MG tablet Commonly known as: DIAMOX   cloNIDine 0.1 MG tablet Commonly known as: CATAPRES   meloxicam 7.5 MG tablet Commonly known as: MOBIC   methocarbamol 500 MG tablet Commonly known as: ROBAXIN   metoprolol succinate 25 MG 24 hr tablet Commonly known as: TOPROL-XL   multivitamin tablet   omeprazole 20 MG capsule Commonly known as: PRILOSEC   Potassium Chloride 40 MEQ/15ML (20%) Soln   predniSONE 5 MG tablet Commonly known as: DELTASONE   terconazole 0.8 % vaginal cream Commonly known as: Terazol 3   tiZANidine 2 MG tablet Commonly known as: ZANAFLEX       TAKE these medications    amLODipine 10 MG tablet Commonly known as: NORVASC TAKE 1 TABLET BY MOUTH EVERY DAY   aspirin EC 81 MG  tablet Take 81 mg by mouth daily.   atorvastatin 20 MG tablet Commonly known as: LIPITOR TAKE 1 TABLET BY MOUTH EVERY DAY   blood glucose meter kit and supplies Dispense based on patient and insurance preference. Use up to four times daily as directed. (FOR ICD-10 E10.9, E11.9).   carvedilol 12.5 MG tablet Commonly known as: COREG Take 1 tablet (12.5 mg total) by mouth 2 (two) times daily with a meal.   cyanocobalamin 1000 MCG tablet Take 1,000 mcg by mouth daily.   esomeprazole 40 MG capsule Commonly known as: NEXIUM TAKE 1 CAPSULE (40 MG TOTAL) BY MOUTH DAILY AT 12 NOON.   famotidine 20 MG tablet Commonly known as: PEPCID TAKE 1 TABLET BY MOUTH TWICE A DAY   FREESTYLE LITE test strip Generic drug: glucose blood USE AS DIRECTED UP TO 4 TIMES DAILY What changed: See the new instructions.   gabapentin 300 MG capsule Commonly known as: NEURONTIN Take 1 capsule (300 mg total) by  mouth 3 (three) times daily. TAKE 1 CAPSULE BY MOUTH THREE TIMES A DAY   Januvia 100 MG tablet Generic drug: sitaGLIPtin TAKE 1 TABLET BY MOUTH EVERY DAY What changed: how much to take   Klor-Con M10 10 MEQ tablet Generic drug: potassium chloride Take 10 mEq by mouth 2 (two) times daily.   lisinopril 20 MG tablet Commonly known as: ZESTRIL Take 20 mg by mouth daily.   meclizine 25 MG tablet Commonly known as: ANTIVERT TAKE 1/2 TO 1 TABLET BY MOUTH TWICE A DAY AS NEEDED FOR DIZZINESS   ONE TOUCH ULTRA 2 w/Device Kit Use to check blood sugars twice a day Dx E11.9   onetouch ultrasoft lancets USE TO HELP CHECK BLOOD SUGARS TWICE A DAY E11.9   Vitamin D-3 25 MCG (1000 UT) Caps Take 1,000 Units by mouth.       Allergies  Allergen Reactions   Codeine Other (See Comments)    Woke up the next morning and did not know where she was   Januvia [Sitagliptin] Itching   Other     Doesn't like to take pain medication due to hallucinations.   Shellfish Allergy       The results of  significant diagnostics from this hospitalization (including imaging, microbiology, ancillary and laboratory) are listed below for reference.    Significant Diagnostic Studies: DG Chest 2 View  Result Date: 01/02/2021 CLINICAL DATA:  Chest pain EXAM: CHEST - 2 VIEW COMPARISON:  10/18/2020 FINDINGS: The heart size and mediastinal contours are within normal limits. Atherosclerotic calcification of the aortic knob. No focal airspace consolidation, pleural effusion, or pneumothorax. The visualized skeletal structures are unremarkable. IMPRESSION: No active cardiopulmonary disease. Electronically Signed   By: Davina Poke D.O.   On: 01/02/2021 13:19   ECHOCARDIOGRAM COMPLETE  Result Date: 01/03/2021    ECHOCARDIOGRAM REPORT   Patient Name:   Meredith Phillips Date of Exam: 01/03/2021 Medical Rec #:  [id number]       Height:       63.0 in Accession #:    [id number]      Weight:       150.0 lb Date of Birth:  [birth year]        BSA:          40.711 m Patient Age:    75 years        BP:           126/74 mmHg Patient Gender: F               HR:           56 bpm. Exam Location:  Inpatient Procedure: 2D Echo, Cardiac Doppler and Color Doppler STAT ECHO Indications:    Chest Pain R07.9  History:        Patient has prior history of Echocardiogram examinations, most                 recent 03/26/2018. Risk Factors:Hypertension, Diabetes,                 Dyslipidemia and Non-Smoker.  Sonographer:    Vickie Epley RDCS Referring Phys: [phone] [place]  1. Left ventricular ejection fraction, by estimation, is 55%. The left ventricle has normal function. The left ventricle has no regional wall motion abnormalities. There is mild concentric left ventricular hypertrophy, with basal-septal hypertrophy. Left ventricular diastolic parameters are consistent with Grade I diastolic dysfunction (impaired relaxation).  2. Right ventricular systolic function is normal. The right ventricular  size is normal. Tricuspid  regurgitation signal is inadequate for assessing PA pressure.  3. The mitral valve is grossly normal but with calcification of the distal tip of the AMVL. Trivial mitral valve regurgitation. No evidence of mitral stenosis.  4. The aortic valve is tricuspid. There is mild calcification of the aortic valve. There is mild thickening of the aortic valve. Aortic valve regurgitation is not visualized. Aortic valve sclerosis is present, with no evidence of aortic valve stenosis.  5. Aortic dilatation noted. There is mild dilatation of the ascending aorta, measuring 40 mm.  6. The inferior vena cava is normal in size with greater than 50% respiratory variability, suggesting right atrial pressure of 3 mmHg. Comparison(s): A prior study was performed on 03/26/2018. No significant change from prior study. FINDINGS  Left Ventricle: Left ventricular ejection fraction, by estimation, is 55%. The left ventricle has normal function. The left ventricle has no regional wall motion abnormalities. The left ventricular internal cavity size was normal in size. There is mild concentric left ventricular hypertrophy of the basal-septal segment. Left ventricular diastolic parameters are consistent with Grade I diastolic dysfunction (impaired relaxation). Right Ventricle: The right ventricular size is normal. No increase in right ventricular wall thickness. Right ventricular systolic function is normal. Tricuspid regurgitation signal is inadequate for assessing PA pressure. Left Atrium: Left atrial size was normal in size. Right Atrium: Right atrial size was normal in size. Pericardium: There is no evidence of pericardial effusion. Mitral Valve: The mitral valve is grossly normal. There is mild calcification of the mitral valve leaflet(s). Trivial mitral valve regurgitation. No evidence of mitral valve stenosis. Tricuspid Valve: The tricuspid valve is normal in structure. Tricuspid valve regurgitation is trivial. Aortic Valve: The aortic  valve is tricuspid. There is mild calcification of the aortic valve. There is mild thickening of the aortic valve. Aortic valve regurgitation is not visualized. Mild aortic valve sclerosis is present, with no evidence of aortic valve stenosis. Pulmonic Valve: The pulmonic valve was normal in structure. Pulmonic valve regurgitation is not visualized. No evidence of pulmonic stenosis. Aorta: Aortic dilatation noted. There is mild dilatation of the ascending aorta, measuring 40 mm. Venous: The inferior vena cava is normal in size with greater than 50% respiratory variability, suggesting right atrial pressure of 3 mmHg. IAS/Shunts: The atrial septum is grossly normal.  LEFT VENTRICLE PLAX 2D LVIDd:         3.90 cm      Diastology LVIDs:         2.90 cm      LV e' medial:    4.33 cm/s LV PW:         1.00 cm      LV E/e' medial:  15.5 LV IVS:        1.20 cm      LV e' lateral:   6.05 cm/s LVOT diam:     2.10 cm      LV E/e' lateral: 11.1 LV SV:         79 LV SV Index:   46 LVOT Area:     3.46 cm  LV Volumes (MOD) LV vol d, MOD A2C: 120.0 ml LV vol d, MOD A4C: 95.9 ml LV vol s, MOD A2C: 43.6 ml LV vol s, MOD A4C: 40.3 ml LV SV MOD A2C:     76.4 ml LV SV MOD A4C:     95.9 ml LV SV MOD BP:      66.1 ml RIGHT VENTRICLE RV S prime:  12.30 cm/s TAPSE (M-mode): 3.0 cm LEFT ATRIUM             Index       RIGHT ATRIUM           Index LA diam:        3.40 cm 1.99 cm/m  RA Area:     13.60 cm LA Vol (A2C):   52.9 ml 30.92 ml/m RA Volume:   37.50 ml  21.92 ml/m LA Vol (A4C):   35.2 ml 20.57 ml/m LA Biplane Vol: 43.1 ml 25.19 ml/m  AORTIC VALVE LVOT Vmax:   77.80 cm/s LVOT Vmean:  55.000 cm/s LVOT VTI:    0.227 m  AORTA Ao Root diam: 3.40 cm Ao Asc diam:  4.00 cm Ao Desc diam: 2.20 cm MITRAL VALVE MV Area (PHT): 2.10 cm     SHUNTS MV Decel Time: 362 msec     Systemic VTI:  0.23 m MV E velocity: 66.90 cm/s   Systemic Diam: 2.10 cm MV A velocity: 102.00 cm/s MV E/A ratio:  0.66 Rudean Haskell MD Electronically signed by  Rudean Haskell MD Signature Date/Time: 01/03/2021/11:09:16 AM    Final     Microbiology: Recent Results (from the past 240 hour(s))  SARS CORONAVIRUS 2 (TAT 6-24 HRS) Nasopharyngeal Nasopharyngeal Swab     Status: None   Collection Time: 01/02/21  3:58 PM   Specimen: Nasopharyngeal Swab  Result Value Ref Range Status   SARS Coronavirus 2 NEGATIVE NEGATIVE Final    Comment: (NOTE) SARS-CoV-2 target nucleic acids are NOT DETECTED.  The SARS-CoV-2 RNA is generally detectable in upper and lower respiratory specimens during the acute phase of infection. Negative results do not preclude SARS-CoV-2 infection, do not rule out co-infections with other pathogens, and should not be used as the sole basis for treatment or other patient management decisions. Negative results must be combined with clinical observations, patient history, and epidemiological information. The expected result is Negative.  Fact Sheet for Patients: SugarRoll.be  Fact Sheet for Healthcare Providers: https://www.woods-mathews.com/  This test is not yet approved or cleared by the [place] FDA and  has been authorized for detection and/or diagnosis of SARS-CoV-2 by FDA under an Emergency Use Authorization (EUA). This EUA will remain  in effect (meaning this test can be used) for the duration of the COVID-19 declaration under Se ction 564(b)(1) of the Act, 21 U.S.C. section 360bbb-3(b)(1), unless the authorization is terminated or revoked sooner.  Performed at [facility], [place] [address], [place], [place] [address]      Labs: Basic Metabolic Panel: Recent Labs  Lab 01/02/21 1156 01/02/21 1957 01/03/21 0212  NA 139  --  139  K 3.6  --  3.2*  CL 105  --  107  CO2 25  --  23  GLUCOSE 105*  --  92  BUN 8  --  7*  CREATININE 1.19* 1.08* 1.04*  CALCIUM 10.1  --  9.6   Liver Function Tests: Recent Labs  Lab 01/02/21 1354  AST 16  ALT 11  ALKPHOS  61  BILITOT 1.0  PROT 7.7  ALBUMIN 3.9   No results for input(s): LIPASE, AMYLASE in the last 168 hours. No results for input(s): AMMONIA in the last 168 hours. CBC: Recent Labs  Lab 01/02/21 1156 01/02/21 1957 01/03/21 0212  WBC 5.8 4.8 6.0  NEUTROABS 2.3  --   --   HGB 13.0 12.4 12.1  HCT 41.4 38.6 38.1  MCV 90.2 91.5 91.6  PLT 325 270 244   Cardiac Enzymes: No results for input(s): CKTOTAL, CKMB, CKMBINDEX, TROPONINI in the last 168 hours. BNP: BNP (last 3 results) No results for input(s): BNP in the last 8760 hours.  ProBNP (last 3 results) No results for input(s): PROBNP in the last 8760 hours.  CBG: Recent Labs  Lab 01/02/21 2207 01/03/21 0836  GLUCAP 88 109*       Signed:  Nita Sells MD   [facility] 01/03/2021, 11:18 AM

## 2021-01-03 NOTE — Progress Notes (Signed)
  Echocardiogram 2D Echocardiogram has been performed.  Meredith Phillips 01/03/2021, 10:57 AM

## 2021-01-03 NOTE — ED Notes (Signed)
Pt found wandering in the hallway, requesting to call her daughter to take pt home. Pt assisted back to room and into bed. Alert to self and place only

## 2021-02-01 DIAGNOSIS — Z09 Encounter for follow-up examination after completed treatment for conditions other than malignant neoplasm: Secondary | ICD-10-CM | POA: Insufficient documentation

## 2021-02-01 HISTORY — DX: Encounter for follow-up examination after completed treatment for conditions other than malignant neoplasm: Z09

## 2021-03-03 ENCOUNTER — Encounter (HOSPITAL_BASED_OUTPATIENT_CLINIC_OR_DEPARTMENT_OTHER): Payer: Self-pay | Admitting: Pharmacy Technician

## 2021-03-03 ENCOUNTER — Other Ambulatory Visit: Payer: Self-pay

## 2021-03-03 ENCOUNTER — Emergency Department (HOSPITAL_BASED_OUTPATIENT_CLINIC_OR_DEPARTMENT_OTHER)
Admission: EM | Admit: 2021-03-03 | Discharge: 2021-03-03 | Discharge status: Against Medical Advice | Payer: Medicare Other | Attending: Emergency Medicine | Admitting: Emergency Medicine

## 2021-03-03 ENCOUNTER — Emergency Department (HOSPITAL_BASED_OUTPATIENT_CLINIC_OR_DEPARTMENT_OTHER): Payer: Medicare Other

## 2021-03-03 DIAGNOSIS — R079 Chest pain, unspecified: Secondary | ICD-10-CM

## 2021-03-03 DIAGNOSIS — Z7982 Long term (current) use of aspirin: Secondary | ICD-10-CM | POA: Diagnosis not present

## 2021-03-03 DIAGNOSIS — F0390 Unspecified dementia without behavioral disturbance: Secondary | ICD-10-CM | POA: Diagnosis not present

## 2021-03-03 DIAGNOSIS — R0602 Shortness of breath: Secondary | ICD-10-CM | POA: Diagnosis not present

## 2021-03-03 DIAGNOSIS — I10 Essential (primary) hypertension: Secondary | ICD-10-CM | POA: Insufficient documentation

## 2021-03-03 DIAGNOSIS — E1140 Type 2 diabetes mellitus with diabetic neuropathy, unspecified: Secondary | ICD-10-CM | POA: Insufficient documentation

## 2021-03-03 DIAGNOSIS — Z7984 Long term (current) use of oral hypoglycemic drugs: Secondary | ICD-10-CM | POA: Diagnosis not present

## 2021-03-03 DIAGNOSIS — R001 Bradycardia, unspecified: Secondary | ICD-10-CM | POA: Diagnosis not present

## 2021-03-03 DIAGNOSIS — R42 Dizziness and giddiness: Secondary | ICD-10-CM | POA: Diagnosis not present

## 2021-03-03 DIAGNOSIS — Z79899 Other long term (current) drug therapy: Secondary | ICD-10-CM | POA: Insufficient documentation

## 2021-03-03 LAB — BASIC METABOLIC PANEL
Anion gap: 8 (ref 5–15)
BUN: 11 mg/dL (ref 8–23)
CO2: 26 mmol/L (ref 22–32)
Calcium: 9.6 mg/dL (ref 8.9–10.3)
Chloride: 105 mmol/L (ref 98–111)
Creatinine, Ser: 1.11 mg/dL — ABNORMAL HIGH (ref 0.44–1.00)
GFR, Estimated: 51 mL/min — ABNORMAL LOW (ref >60)
Glucose, Bld: 131 mg/dL — ABNORMAL HIGH (ref 70–99)
Potassium: 3.7 mmol/L (ref 3.5–5.1)
Sodium: 139 mmol/L (ref 135–145)

## 2021-03-03 LAB — TROPONIN I (HIGH SENSITIVITY)
Troponin I (High Sensitivity): 4 ng/L (ref <18)
Troponin I (High Sensitivity): 4 ng/L (ref <18)

## 2021-03-03 LAB — CBC
HCT: 41.3 % (ref 36.0–46.0)
Hemoglobin: 13.5 g/dL (ref 12.0–15.0)
MCH: 29.2 pg (ref 26.0–34.0)
MCHC: 32.7 g/dL (ref 30.0–36.0)
MCV: 89.2 fL (ref 80.0–100.0)
Platelets: 329 10*3/uL (ref 150–400)
RBC: 4.63 MIL/uL (ref 3.87–5.11)
RDW: 13.4 % (ref 11.5–15.5)
WBC: 5.7 10*3/uL (ref 4.0–10.5)
nRBC: 0 % (ref 0.0–0.2)

## 2021-03-03 LAB — CBG MONITORING, ED: Glucose-Capillary: 85 mg/dL (ref 70–99)

## 2021-03-03 MED ORDER — ASPIRIN 81 MG PO CHEW
324.0000 mg | CHEWABLE_TABLET | Freq: Once | ORAL | Status: AC
  Administered 2021-03-03: 324 mg via ORAL
  Filled 2021-03-03: qty 4

## 2021-03-03 MED ORDER — NITROGLYCERIN 0.4 MG SL SUBL
0.4000 mg | SUBLINGUAL_TABLET | SUBLINGUAL | Status: DC | PRN
  Administered 2021-03-03: 0.4 mg via SUBLINGUAL
  Filled 2021-03-03: qty 1

## 2021-03-03 NOTE — Discharge Instructions (Signed)
You were seen today for evaluation of chest and shoulder pain.  Considering your age and risk factors, we recommended that you be admitted to the hospital today.  However considering decision not to be admitted, please call your cardiologist today to schedule appointment ASAP for outpatient management.  Please return to the emergency department if your pain continues or worsens.

## 2021-03-03 NOTE — ED Provider Notes (Signed)
[facility] Provider Note   CSN: [id number] Arrival date & time: 03/03/21  1011     History Chief Complaint  Patient presents with   Chest Pain    Meredith Phillips is a 75 y.o. female.  Patient with history of diabetes presents today with chest pain.  Patient states that she woke up with this, it feels like pressure, and it radiates into her left shoulder.  It is not exertional or pleuritic.  Is not reproduced on palpation.  She has never felt this pain before.  Endorses some associated dizziness and shortness of breath.  Denies fevers, chills, nausea, vomiting, or leg swelling.  The history is provided by the patient. No language interpreter was used.  Chest Pain Associated symptoms: dizziness and shortness of breath   Associated symptoms: no abdominal pain, no cough, no dysphagia, no fatigue, no fever, no headache, no nausea, no numbness, no palpitations, no vomiting and no weakness       Past Medical History:  Diagnosis Date   Arthritis    Benign essential HTN 02/18/2015   Cellulitis 02/18/2015   Chicken pox    Chronic fatigue    Dementia ([place])    Depression    since last 04/2018 daughter passed.     Diabetes mellitus without complication ([place])    Diabetic peripheral neuropathy ([facility])    GERD (gastroesophageal reflux disease)    Hypercholesteremia    Hypertension    Liver disease    Polyp of colon     Patient Active Problem List   Diagnosis Date Noted   Chest pain 01/02/2021   Hypertensive urgency 01/02/2021   Diabetes mellitus type 2 in nonobese [facility]) 01/02/2021   Malaise and fatigue 04/26/2019   Head trauma 03/29/2019   Greater trochanteric pain syndrome of left lower extremity 01/18/2019   Hyperlipidemia 03/26/2018   Hyponatremia 03/26/2018   Atypical chest pain 03/25/2018   Urge incontinence 02/25/2018   Glucosuria 02/25/2018   Anxiety and depression 12/16/2017   Memory loss 11/02/2017   B12 deficiency 11/02/2017   Vertigo  10/10/2017   Trochanteric bursitis of left hip 10/10/2017   Murmur, cardiac 04/15/2017   Asymptomatic postmenopausal estrogen deficiency 04/15/2017   Right-sided low back pain without sciatica 02/10/2017   Grief at loss of child 07/29/2016   Allergic rhinitis 02/27/2016   GERD (gastroesophageal reflux disease) 08/06/2015   Baker's cyst, ruptured 08/03/2015   Type 2 diabetes mellitus ([place]) 06/29/2015   It band syndrome, left 06/15/2015   Benign essential HTN 02/18/2015   Cellulitis 02/18/2015   Sciatica 01/17/2015   Routine general medical examination at a health care facility 12/26/2014   Medicare annual wellness visit, subsequent 12/26/2014   Generalized osteoarthritis of multiple sites 11/25/2014   GERD with esophagitis 11/25/2014   Hip arthrosis 11/25/2014   Diarrhea in adult patient 11/29/2013    Past Surgical History:  Procedure Laterality Date   APPENDECTOMY     HERNIA REPAIR     KNEE SURGERY     ROTATOR CUFF REPAIR     TONSILLECTOMY       OB History   No obstetric history on file.     Family History  Problem Relation Age of Onset   Healthy Mother    Healthy Father    Healthy Paternal Grandmother    Healthy Paternal Grandfather     Social History   Tobacco Use   Smoking status: Never   Smokeless tobacco: Never  Substance Use Topics   Alcohol  use: No   Drug use: No    Home Medications Prior to Admission medications   Medication Sig Start Date End Date Taking? Authorizing Provider  amLODipine (NORVASC) 10 MG tablet TAKE 1 TABLET BY MOUTH EVERY DAY 10/16/20   Saguier, Percell Miller, PA-C  aspirin EC 81 MG tablet Take 81 mg by mouth daily.    [provider]  atorvastatin (LIPITOR) 20 MG tablet TAKE 1 TABLET BY MOUTH EVERY DAY 10/16/20   Saguier, Percell Miller, PA-C  blood glucose meter kit and supplies Dispense based on patient and insurance preference. Use up to four times daily as directed. (FOR ICD-10 E10.9, E11.9). 04/01/19   Saguier, Percell Miller, PA-C  Blood  Glucose Monitoring Suppl (ONE TOUCH ULTRA 2) w/Device KIT Use to check blood sugars twice a day Dx E11.9 09/01/17   Marrian Salvage, FNP  carvedilol (COREG) 12.5 MG tablet Take 1 tablet (12.5 mg total) by mouth 2 (two) times daily with a meal. 01/03/21   Nita Sells, MD  Cholecalciferol (VITAMIN D-3) 1000 UNITS CAPS Take 1,000 Units by mouth.    [provider]  cyanocobalamin 1000 MCG tablet Take 1,000 mcg by mouth daily.    [provider]  esomeprazole (NEXIUM) 40 MG capsule TAKE 1 CAPSULE (40 MG TOTAL) BY MOUTH DAILY AT 12 NOON. 10/16/20   [facility] [facility], Percell Miller, PA-C  famotidine (PEPCID) 20 MG tablet TAKE 1 TABLET BY MOUTH TWICE A DAY 12/08/18   Saguier, Percell Miller, PA-C  FREESTYLE LITE test strip USE AS DIRECTED UP TO 4 TIMES DAILY 05/31/19   Saguier, Percell Miller, PA-C  gabapentin (NEURONTIN) 300 MG capsule Take 1 capsule (300 mg total) by mouth 3 (three) times daily. TAKE 1 CAPSULE BY MOUTH THREE TIMES A DAY 05/17/18   Lance Sell, NP  JANUVIA 100 MG tablet TAKE 1 TABLET BY MOUTH EVERY DAY 05/02/19   Saguier, Percell Miller, PA-C  KLOR-CON M10 10 MEQ tablet Take 10 mEq by mouth 2 (two) times daily. 10/19/20   [provider]  Lancets [facility] ULTRASOFT) lancets USE TO HELP CHECK BLOOD SUGARS TWICE A DAY E11.9 11/30/17   Lance Sell, NP  lisinopril (ZESTRIL) 20 MG tablet Take 20 mg by mouth daily. 12/27/20   [provider]  meclizine (ANTIVERT) 25 MG tablet TAKE 1/2 TO 1 TABLET BY MOUTH TWICE A DAY AS NEEDED FOR DIZZINESS 02/21/20   Saguier, Percell Miller, PA-C  acetaZOLAMIDE (DIAMOX) 250 MG tablet TAKE 1 TABLET BY MOUTH TWICE A DAY Patient taking differently: Take 250 mg by mouth 2 (two) times daily. 02/22/19 01/03/21  Saguier, Percell Miller, PA-C  cloNIDine (CATAPRES) 0.1 MG tablet Take 1 tablet (0.1 mg total) by mouth 2 (two) times daily. 10/11/18 01/03/21  Saguier, Percell Miller, PA-C  metoprolol succinate (TOPROL-XL) 25 MG 24 hr tablet Take 1 tablet (25 mg total) by mouth daily.  03/04/19 01/03/21  Saguier, Percell Miller, PA-C  omeprazole (PRILOSEC) 20 MG capsule Take 20 mg by mouth daily. 11/20/20 01/03/21  [provider]  Potassium Chloride 40 MEQ/15ML (20%) SOLN Take 40 mg by mouth daily for 7 days. 05/03/19 01/03/21  Dorie Rank, MD    Allergies    Codeine, Januvia [sitagliptin], Other, and Shellfish allergy  Review of Systems   Review of Systems  Constitutional:  Negative for chills, fatigue and fever.  HENT:  Negative for congestion, postnasal drip, rhinorrhea, sore throat, trouble swallowing and voice change.   Respiratory:  Positive for shortness of breath. Negative for cough.   Cardiovascular:  Positive for chest pain. Negative for palpitations  and leg swelling.  Gastrointestinal:  Negative for abdominal distention, abdominal pain, constipation, diarrhea, nausea and vomiting.  Allergic/Immunologic: Negative for immunocompromised state.  Neurological:  Positive for dizziness. Negative for tremors, seizures, syncope, facial asymmetry, speech difficulty, weakness, light-headedness, numbness and headaches.  Psychiatric/Behavioral:  Negative for confusion and decreased concentration.   All other systems reviewed and are negative.  Physical Exam Updated Vital Signs BP (!) 200/86   Pulse (!) 58   Temp 98.8 F (37.1 C)   Resp 20   Ht '5\' 3"'  (1.6 m)   Wt 70.3 kg   SpO2 100%   BMI 27.46 kg/m   Physical Exam Vitals and nursing note reviewed.  Constitutional:      General: She is not in acute distress.    Appearance: Normal appearance. She is well-developed and normal weight. She is not ill-appearing, toxic-appearing or diaphoretic.  HENT:     Head: Normocephalic.     Mouth/Throat:     Mouth: Mucous membranes are moist.  Eyes:     General: No scleral icterus. Cardiovascular:     Rate and Rhythm: Regular rhythm. Bradycardia present.     Heart sounds: Normal heart sounds. No murmur heard. Pulmonary:     Effort: Pulmonary effort is normal. No respiratory  distress.     Breath sounds: Normal breath sounds. No stridor. No decreased breath sounds, wheezing, rhonchi or rales.  Chest:     Chest wall: No mass, deformity, tenderness or edema.  Abdominal:     General: Abdomen is flat. Bowel sounds are normal.     Palpations: Abdomen is soft.  Musculoskeletal:     Cervical back: Normal range of motion and neck supple.     Right lower leg: No tenderness. No edema.     Left lower leg: No tenderness. No edema.  Skin:    General: Skin is warm and dry.  Neurological:     General: No focal deficit present.     Mental Status: She is alert.     Motor: No weakness.  Psychiatric:        Mood and Affect: Mood normal.        Behavior: Behavior normal.    ED Results / Procedures / Treatments   Labs (all labs ordered are listed, but only abnormal results are displayed) Labs Reviewed  BASIC METABOLIC PANEL  CBC  CBG MONITORING, ED  TROPONIN I (HIGH SENSITIVITY)    EKG EKG Interpretation  Date/Time:  Sunday March 03 2021 10:44:47 EDT Ventricular Rate:  56 PR Interval:  154 QRS Duration: 131 QT Interval:  504 QTC Calculation: 487 R Axis:   1 Text Interpretation: Sinus rhythm Left bundle branch block No significant change since last tracing Confirmed by Calvert Cantor [phone]) on 03/03/2021 10:52:23 AM  Radiology No results found.  Procedures Procedures   Medications Ordered in ED Medications  aspirin chewable tablet 324 mg (has no administration in time range)  nitroGLYCERIN (NITROSTAT) SL tablet 0.4 mg (has no administration in time range)    ED Course  I have reviewed the triage vital signs and the nursing notes.  Pertinent labs & imaging results that were available during my care of the patient were reviewed by me and considered in my medical decision making (see chart for details).    MDM Rules/Calculators/A&P                         Presents today with chest pain that radiates to her left  shoulder and jaw since waking  this morning.  Has history of hypertension and diabetes.  Most recent echo 2 months ago reveals EF of 55%.  Labs and imaging unremarkable for acute findings at this time, troponins are negative and flat.  EKG reveals left bundle branch block which is baseline for her.  Pain is reduced with nitroglycerin.  Patient's heart score is a 6.  Given risk factors and comorbidities, I feel that this patient needs admission for observation of continued chest pain.   However, upon discussing this with the patient patient is not interested in being admitted to the hospital at this time.  He is aware that this is [place], the patient and I discussed the nature and purpose, risks and benefits, as well as, the alternatives of treatment. Time was given to allow the opportunity to ask questions and consider options. After the discussion, the patient decided to refuse admission. The patient was informed that refusal could lead to, but was not limited to, death, permanent disability, or severe pain. Prior to refusing, I determined that the patient had the capacity to make their decision and understood the consequences of that decision. After refusal, I made every reasonable effort to treat them to the best of my ability.  The patient was notified that they may return to the emergency department at any time for further treatment.   I recommended that the patient receive close cardiology follow-up.  Patient is amenable with this plan.    This is a shared visit with supervising physician Dr. Karle Starch who has independently evaluated patient & provided guidance in evaluation/management/disposition, in agreement with care     Final Clinical Impression(s) / ED Diagnoses Final diagnoses:  Chest pain, unspecified type    Rx / DC Orders ED Discharge Orders     None     An After Visit Summary was printed and given to the patient.    Nestor Lewandowsky 03/03/21 1439    Truddie Hidden, MD 03/03/21  [phone]

## 2021-03-03 NOTE — ED Triage Notes (Signed)
Pt complaint of left shoulder radiating to her left chest and left side of her neck this morning and shortness of breath.

## 2021-03-25 ENCOUNTER — Encounter (HOSPITAL_BASED_OUTPATIENT_CLINIC_OR_DEPARTMENT_OTHER): Payer: Self-pay | Admitting: *Deleted

## 2021-03-25 ENCOUNTER — Emergency Department (HOSPITAL_BASED_OUTPATIENT_CLINIC_OR_DEPARTMENT_OTHER)
Admission: EM | Admit: 2021-03-25 | Discharge: 2021-03-25 | Disposition: A | Discharge status: Home / Self Care | Payer: Medicare Other | Attending: Emergency Medicine | Admitting: Emergency Medicine

## 2021-03-25 ENCOUNTER — Other Ambulatory Visit: Payer: Self-pay

## 2021-03-25 DIAGNOSIS — Z5321 Procedure and treatment not carried out due to patient leaving prior to being seen by health care provider: Secondary | ICD-10-CM | POA: Diagnosis not present

## 2021-03-25 DIAGNOSIS — R109 Unspecified abdominal pain: Secondary | ICD-10-CM | POA: Insufficient documentation

## 2021-03-25 LAB — BASIC METABOLIC PANEL
Anion gap: 7 (ref 5–15)
BUN: 15 mg/dL (ref 8–23)
CO2: 27 mmol/L (ref 22–32)
Calcium: 9.5 mg/dL (ref 8.9–10.3)
Chloride: 102 mmol/L (ref 98–111)
Creatinine, Ser: 1.3 mg/dL — ABNORMAL HIGH (ref 0.44–1.00)
GFR, Estimated: 42 mL/min — ABNORMAL LOW (ref >60)
Glucose, Bld: 131 mg/dL — ABNORMAL HIGH (ref 70–99)
Potassium: 3.3 mmol/L — ABNORMAL LOW (ref 3.5–5.1)
Sodium: 136 mmol/L (ref 135–145)

## 2021-03-25 LAB — CBC
HCT: 37.8 % (ref 36.0–46.0)
Hemoglobin: 12.4 g/dL (ref 12.0–15.0)
MCH: 29.4 pg (ref 26.0–34.0)
MCHC: 32.8 g/dL (ref 30.0–36.0)
MCV: 89.6 fL (ref 80.0–100.0)
Platelets: 289 K/uL (ref 150–400)
RBC: 4.22 MIL/uL (ref 3.87–5.11)
RDW: 13.3 % (ref 11.5–15.5)
WBC: 6.8 K/uL (ref 4.0–10.5)
nRBC: 0 % (ref 0.0–0.2)

## 2021-03-25 NOTE — ED Triage Notes (Signed)
C/o right flank pain x 1 day  

## 2021-04-11 ENCOUNTER — Other Ambulatory Visit: Payer: Self-pay

## 2021-04-11 DIAGNOSIS — B019 Varicella without complication: Secondary | ICD-10-CM | POA: Insufficient documentation

## 2021-04-11 DIAGNOSIS — M1990 Unspecified osteoarthritis, unspecified site: Secondary | ICD-10-CM | POA: Insufficient documentation

## 2021-04-11 DIAGNOSIS — F0390 Unspecified dementia without behavioral disturbance: Secondary | ICD-10-CM | POA: Insufficient documentation

## 2021-04-11 DIAGNOSIS — K769 Liver disease, unspecified: Secondary | ICD-10-CM | POA: Insufficient documentation

## 2021-04-11 DIAGNOSIS — R5382 Chronic fatigue, unspecified: Secondary | ICD-10-CM | POA: Insufficient documentation

## 2021-04-11 DIAGNOSIS — K635 Polyp of colon: Secondary | ICD-10-CM | POA: Insufficient documentation

## 2021-04-11 DIAGNOSIS — E1142 Type 2 diabetes mellitus with diabetic polyneuropathy: Secondary | ICD-10-CM | POA: Insufficient documentation

## 2021-04-16 ENCOUNTER — Other Ambulatory Visit: Payer: Self-pay | Admitting: Medical

## 2021-04-16 DIAGNOSIS — K2100 Gastro-esophageal reflux disease with esophagitis, without bleeding: Secondary | ICD-10-CM

## 2021-04-22 ENCOUNTER — Ambulatory Visit: Payer: Medicare Other | Admitting: Cardiology

## 2021-05-29 NOTE — Progress Notes (Deleted)
Cardiology Office Note:    Date:  05/29/2021   ID:  Meredith Phillips, DOB 1946-01-28, MRN [id number]  PCP:  Pcp, No  Cardiologist:  Norman Herrlich, MD   Referring MD: Silva Bandy, PA-C  ASSESSMENT:    No diagnosis found. PLAN:    In order of problems listed above:  ***  Next appointment   Medication Adjustments/Labs and Tests Ordered: Current medicines are reviewed at length with the patient today.  Concerns regarding medicines are outlined above.  No orders of the defined types were placed in this encounter.  No orders of the defined types were placed in this encounter.    No chief complaint on file. ***  History of Present Illness:    Meredith Phillips is a 75 y.o. female with multiple cardiovascular risk including type 2 diabetes complicated with neuropathy hypertension hyperlipidemia who is being seen today for the evaluation of chest pain at the request of Smoot, Shawn Route, PA-C.  She was seen at [facility] ED for chest pain 03/03/2021 her EKG showed sinus rhythm left bundle branch block.  High-sensitivity troponin initial as well as previous all normal creatinine 1.11 GFR 51 cc chest x-ray normal. Chest pain described as onset at rest pressure and sensation radiating to the left chest  She has had a pharmacologic myocardial perfusion imaging performed 03/26/2018 the study was low risk LV function normal EF 69% and normal myocardial perfusion. CT of chest pulmonary embolism protocol 03/26/2018 showed no evidence of pulmonary embolism there was aortic atherosclerosis and coronary artery calcification noted.  She is seen in cardiology consultation during hospitalization 01/03/2019 echocardiogram showed normal left ventricular systolic function elevated left atrial pressure mild enlargement ascending aorta 41 mm and no significant valvular abnormality.  EKG showed stable pattern left bundle branch block no evidence of acute coronary syndrome and no further cardiac  evaluation was recommended at that time.  She also had branch retinal vein occlusion.  When seen 03/22/2021 she is referred back to cardiology for further evaluation.  There is a notation she had just been seen in urgent care for confusion hypoglycemia and chest pain.  There is also been concern for memory and cognitive function and she has been referred to neurology.  LDL cholesterol 03/22/2021 was 80 hemoglobin A1c 5.4% GFR 48 cc creatinine 1.18.  From care everywhere she had another myocardial perfusion image performed 10/19/2020 showing EF of 74% normal cardiac function and perfusion low risk.   Past Medical History:  Diagnosis Date   Anxiety    Arthritis    B12 deficiency    Benign essential HTN 02/18/2015   Cellulitis 02/18/2015   Chest pain    Chicken pox    Chronic fatigue    Dementia ([facility])    Depression    since last 04/2018 daughter passed.     Depression    Diabetes mellitus without complication ([facility])    Diabetic peripheral neuropathy ([facility])    GERD (gastroesophageal reflux disease)    Hypercholesteremia    Hypertension    Liver disease    Polyp of colon     Past Surgical History:  Procedure Laterality Date   APPENDECTOMY     HERNIA REPAIR     KNEE SURGERY     ROTATOR CUFF REPAIR     TONSILLECTOMY      Current Medications: No outpatient medications have been marked as taking for the 05/30/21 encounter (Appointment) with Baldo Daub, MD.     Allergies:  Codeine, Januvia [sitagliptin], Other, and Shellfish allergy   Social History   Socioeconomic History   Marital status: Married    Spouse name: Not on file   Number of children: 5   Years of education: 12   Highest education level: Not on file  Occupational History   Occupation: Retired  Tobacco Use   Smoking status: Never   Smokeless tobacco: Never  Substance and Sexual Activity   Alcohol use: No   Drug use: No   Sexual activity: Not on file  Other Topics Concern   Not on file  Social History  Narrative   Fun: paint, word search   Feels safe where she is living and no concern for abuse   Denies religious beliefs effecting healthcare.    Social Determinants of Health   Financial Resource Strain: Not on file  Food Insecurity: Not on file  Transportation Needs: Not on file  Physical Activity: Not on file  Stress: Not on file  Social Connections: Not on file     Family History: The patient's ***family history includes Healthy in her father, mother, paternal grandfather, and paternal grandmother.  ROS:   ROS Please see the history of present illness.    *** All other systems reviewed and are negative.  EKGs/Labs/Other Studies Reviewed:    The following studies were reviewed today: ***  EKG:  EKG is *** ordered today.  The ekg ordered today is personally reviewed and demonstrates ***  Recent Labs: 01/02/2021: ALT 11 03/25/2021: BUN 15; Creatinine, Ser 1.30; Hemoglobin 12.4; Platelets 289; Potassium 3.3; Sodium 136  Recent Lipid Panel    Component Value Date/Time   CHOL 249 (H) 09/23/2018 0800   TRIG 67.0 09/23/2018 0800   HDL 66.20 09/23/2018 0800   CHOLHDL 4 09/23/2018 0800   VLDL 13.4 09/23/2018 0800   LDLCALC 170 (H) 09/23/2018 0800    Physical Exam:    VS:  There were no vitals taken for this visit.    Wt Readings from Last 3 Encounters:  03/25/21 140 lb (63.5 kg)  03/03/21 155 lb (70.3 kg)  01/02/21 150 lb (68 kg)     GEN: *** Well nourished, well developed in no acute distress HEENT: Normal NECK: No JVD; No carotid bruits LYMPHATICS: No lymphadenopathy CARDIAC: ***RRR, no murmurs, rubs, gallops RESPIRATORY:  Clear to auscultation without rales, wheezing or rhonchi  ABDOMEN: Soft, non-tender, non-distended MUSCULOSKELETAL:  No edema; No deformity  SKIN: Warm and dry NEUROLOGIC:  Alert and oriented x 3 PSYCHIATRIC:  Normal affect     Signed, Norman Herrlich, MD  05/29/2021 9:54 AM    [facility]

## 2021-05-30 ENCOUNTER — Ambulatory Visit: Payer: Medicare Other | Admitting: Cardiology

## 2021-05-30 DIAGNOSIS — E119 Type 2 diabetes mellitus without complications: Secondary | ICD-10-CM

## 2021-05-30 DIAGNOSIS — I10 Essential (primary) hypertension: Secondary | ICD-10-CM

## 2021-05-30 DIAGNOSIS — I447 Left bundle-branch block, unspecified: Secondary | ICD-10-CM

## 2021-05-30 DIAGNOSIS — R079 Chest pain, unspecified: Secondary | ICD-10-CM

## 2021-05-30 DIAGNOSIS — E785 Hyperlipidemia, unspecified: Secondary | ICD-10-CM

## 2021-05-30 NOTE — Progress Notes (Signed)
Cardiology Office Note:    Date:  05/31/2021   ID:  Meredith Phillips, DOB 18-Aug-1945, MRN [id number]  PCP:  Governor Specking, MD  Cardiologist:  Shirlee More, MD   Referring MD: Bud Face, PA-C  ASSESSMENT:    1. Chest pain, unspecified type   2. Benign essential HTN   3. Mixed hyperlipidemia    PLAN:    In order of problems listed above:  She has a chronic chest pain syndrome previous normal myocardial perfusion imaging left bundle branch block and is best suited for cardiac CTA for diagnosis and decision making if she has severe flow-limiting stenosis would benefit from revascularization.  In the interim I would continue treatment which includes aspirin or high intensity statin beta-blocker and calcium channel blocker. Stable she takes multiple antihypertensive agents continue them and I would trend blood pressure there is a lot of anxiety in today's visit Continue high intensity statin  Next appointment 6 weeks   Medication Adjustments/Labs and Tests Ordered: Current medicines are reviewed at length with the patient today.  Concerns regarding medicines are outlined above.  No orders of the defined types were placed in this encounter.  No orders of the defined types were placed in this encounter.   Chief complaint: Icontinue to have chest pain is occurring more frequently.  History of Present Illness:    Meredith Phillips is a 75 y.o. female who is being seen today for the evaluation of chest pain at the request of Smoot, Leary Roca, PA-C.  She was seen by Dr. Skeet Latch consultation 01/02/2021.  She did not have acute coronary syndrome previous myocardial perfusion imaging was normal and she did not have a repeat ischemia evaluation at that time.  She was seen [place] ED 03/03/2021 her EKG showed sinus rhythm left bundle branch block she was advised admission to the hospital the patient left the hospital Meredith Phillips. High-sensitivity troponin  and repeat were both low at 4.  BMP showed a creatinine of 1.11 GFR 51 cc random glucose elevated at 131 hemoglobin 13.5 Chest x-ray showed cardiomegaly.  Previous cardiology evaluation 01/03/2021 echocardiogram showed normal left ventricular size mild concentric LVH EF 36% grade 1 diastolic dysfunction.  Right ventricle normal in size and function.  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  Myocardial perfusion imaging 03/27/2018 was low risk EF 69% normal perfusion pharmacologic Lexiscan test.  Is a very complex visit involving review of multiple cardiac interactions ED visits and previous testing.  Her daughter who has medical training is present participates in evaluation decision making. She has had a difficult year and 1 daughter had died.  She is also under stress at home. She is having increased frequency of chest pain is not exertional but it clearly is related to emotional stress and occurs predominantly at rest.  She describes an aching discomfort in the area of the left shoulder down into the left breast and even at times radiating to the right chest and last for several minutes at times it waxes and wanes through the day and the frequency has increased recently.  Is not exertional in nature no edema shortness of breath palpitation or syncope.  Previous evaluation included a normal myocardial perfusion study.  I discussed options for evaluation of the family including direct referral to coronary angiography cardiac  CTA or repeat functional testing.  I advised cardiac CTA they agree and we will set her up for after the holidays. Past Medical History:  Diagnosis Date   Anxiety    Arthritis    B12 deficiency    Benign essential HTN 02/18/2015   Cellulitis 02/18/2015   Chest pain    Chicken pox    Chronic fatigue     Dementia ([place])    Depression    since last 04/2018 daughter passed.     Depression    Diabetes mellitus without complication ([place])    Diabetic peripheral neuropathy (HCC)    GERD (gastroesophageal reflux disease)    Hypercholesteremia    Hypertension    Liver disease    Polyp of colon     Past Surgical History:  Procedure Laterality Date   APPENDECTOMY     HERNIA REPAIR     KNEE SURGERY     ROTATOR CUFF REPAIR     TONSILLECTOMY      Current Medications: Current Meds  Medication Sig   amLODipine (NORVASC) 10 MG tablet TAKE 1 TABLET BY MOUTH EVERY DAY   aspirin EC 81 MG tablet Take 81 mg by mouth daily.   atorvastatin (LIPITOR) 20 MG tablet TAKE 1 TABLET BY MOUTH EVERY DAY   blood glucose meter kit and supplies Dispense based on patient and insurance preference. Use up to four times daily as directed. (FOR ICD-10 E10.9, E11.9).   Blood Glucose Monitoring Suppl (ONE TOUCH ULTRA 2) w/Device KIT Use to check blood sugars twice a day Dx E11.9   carvedilol (COREG) 12.5 MG tablet Take 1 tablet (12.5 mg total) by mouth 2 (two) times daily with a meal.   Cholecalciferol (VITAMIN D-3) 1000 UNITS CAPS Take 1,000 Units by mouth.   cloNIDine (CATAPRES) 0.1 MG tablet Take 0.1 mg by mouth 2 (two) times daily.   cyanocobalamin 1000 MCG tablet Take 1,000 mcg by mouth daily.   esomeprazole (NEXIUM) 40 MG capsule TAKE 1 CAPSULE (40 MG TOTAL) BY MOUTH DAILY AT 12 NOON.   famotidine (PEPCID) 20 MG tablet TAKE 1 TABLET BY MOUTH TWICE A DAY   FREESTYLE LITE test strip USE AS DIRECTED UP TO 4 TIMES DAILY   gabapentin (NEURONTIN) 300 MG capsule Take 1 capsule (300 mg total) by mouth 3 (three) times daily. TAKE 1 CAPSULE BY MOUTH THREE TIMES A DAY   JANUVIA 100 MG tablet TAKE 1 TABLET BY MOUTH EVERY DAY   KLOR-CON M10 10 MEQ tablet Take 10 mEq by mouth 2 (two) times daily.   Lancets (ONETOUCH ULTRASOFT) lancets USE TO HELP CHECK BLOOD SUGARS TWICE A DAY E11.9   lisinopril (ZESTRIL) 20 MG tablet Take  20 mg by mouth daily.   meclizine (ANTIVERT) 25 MG tablet TAKE 1/2 TO 1 TABLET BY MOUTH TWICE A DAY AS NEEDED FOR DIZZINESS   metoprolol succinate (TOPROL-XL) 25 MG 24 hr tablet Take 25 mg by mouth daily.   telmisartan (MICARDIS) 80 MG tablet Take 80 mg by mouth daily.     Allergies:   Codeine, Januvia [sitagliptin], Other, and Shellfish allergy   Social History   Socioeconomic History   Marital status: Married    Spouse name: Not on file   Number of children: 5   Years of education: 12   Highest education level: Not on file  Occupational History   Occupation: Retired  Tobacco Use   Smoking status: Never   Smokeless tobacco: Never  Substance and Sexual Activity   Alcohol  use: No   Drug use: No   Sexual activity: Not on file  Other Topics Concern   Not on file  Social History Narrative   Fun: paint, word search   Feels safe where she is living and no concern for abuse   Denies religious beliefs effecting healthcare.    Social Determinants of Health   Financial Resource Strain: Not on file  Food Insecurity: Not on file  Transportation Needs: Not on file  Physical Activity: Not on file  Stress: Not on file  Social Connections: Not on file     Family History: The patient's family history includes Healthy in her father, mother, paternal grandfather, and paternal grandmother.  ROS:   ROS Please see the history of present illness.     All other systems reviewed and are negative.  EKGs/Labs/Other Studies Reviewed:    The following studies were reviewed today:   EKG:  EKG is  ordered today.  The ekg ordered today is personally reviewed and demonstrates sinus rhythm left bundle branch block  Recent Labs: 01/02/2021: ALT 11 03/25/2021: BUN 15; Creatinine, Ser 1.30; Hemoglobin 12.4; Platelets 289; Potassium 3.3; Sodium 136  Recent Lipid Panel    Component Value Date/Time   CHOL 249 (H) 09/23/2018 0800   TRIG 67.0 09/23/2018 0800   HDL 66.20 09/23/2018 0800    CHOLHDL 4 09/23/2018 0800   VLDL 13.4 09/23/2018 0800   LDLCALC 170 (H) 09/23/2018 0800    Physical Exam:    VS:  BP (!) 182/92    Pulse 70    Ht 5' (1.524 m)    Wt 152 lb (68.9 kg)    SpO2 99%    BMI 29.69 kg/m     Wt Readings from Last 3 Encounters:  05/31/21 152 lb (68.9 kg)  03/25/21 140 lb (63.5 kg)  03/03/21 155 lb (70.3 kg)     GEN: Anxious well nourished, well developed in no acute distress HEENT: Normal NECK: No JVD; No carotid bruits LYMPHATICS: No lymphadenopathy CARDIAC: RRR, no murmurs, rubs, gallops RESPIRATORY:  Clear to auscultation without rales, wheezing or rhonchi  ABDOMEN: Soft, non-tender, non-distended MUSCULOSKELETAL:  No edema; No deformity  SKIN: Warm and dry NEUROLOGIC:  Alert and oriented x 3 PSYCHIATRIC:  Normal affect     Signed, Shirlee More, MD  05/31/2021 1:21 PM    [facility]

## 2021-05-31 ENCOUNTER — Other Ambulatory Visit: Payer: Self-pay

## 2021-05-31 ENCOUNTER — Ambulatory Visit (INDEPENDENT_AMBULATORY_CARE_PROVIDER_SITE_OTHER): Payer: Medicare Other | Admitting: Cardiology

## 2021-05-31 ENCOUNTER — Encounter: Payer: Self-pay | Admitting: Cardiology

## 2021-05-31 VITALS — BP 182/92 | HR 70 | Ht 60.0 in | Wt 152.0 lb

## 2021-05-31 DIAGNOSIS — R079 Chest pain, unspecified: Secondary | ICD-10-CM | POA: Diagnosis not present

## 2021-05-31 DIAGNOSIS — I10 Essential (primary) hypertension: Secondary | ICD-10-CM

## 2021-05-31 DIAGNOSIS — E782 Mixed hyperlipidemia: Secondary | ICD-10-CM

## 2021-05-31 NOTE — Patient Instructions (Signed)
Medication Instructions:  Your physician recommends that you continue on your current medications as directed. Please refer to the Current Medication list given to you today.  *If you need a refill on your cardiac medications before your next appointment, please call your pharmacy*   Lab Work: Your physician recommends that you return for lab work in: Within one week of your cardiac CT BMP If you have labs (blood work) drawn today and your tests are completely normal, you will receive your results only by: MyChart Message (if you have MyChart) OR A paper copy in the mail If you have any lab test that is abnormal or we need to change your treatment, we will call you to review the results.   Testing/Procedures:   Your cardiac CT will be scheduled at the below location:   [facility] [address] [place], [place] [address] [phone]  If scheduled at [facility], please arrive at the [facility] main entrance (entrance A) of [facility] 30 minutes prior to test start time. You can use the FREE valet parking offered at the main entrance (encouraged to control the heart rate for the test) Proceed to the [facility] Radiology Department (first floor) to check-in and test prep.  Please follow these instructions carefully (unless otherwise directed):  On the Night Before the Test: Be sure to Drink plenty of water. Do not consume any caffeinated/decaffeinated beverages or chocolate 12 hours prior to your test. Do not take any antihistamines 12 hours prior to your test.  On the Day of the Test: Drink plenty of water until 1 hour prior to the test. Do not eat any food 4 hours prior to the test. You may take your regular medications prior to the test.  Take metoprolol two hours prior to test. FEMALES- please wear underwire-free bra if available, avoid dresses & tight clothing      After the Test: Drink plenty of water. After receiving IV contrast, you  may experience a mild flushed feeling. This is normal. On occasion, you may experience a mild rash up to 24 hours after the test. This is not dangerous. If this occurs, you can take Benadryl 25 mg and increase your fluid intake. If you experience trouble breathing, this can be serious. If it is severe call 911 IMMEDIATELY. If it is mild, please call our office. If you take any of these medications: Glipizide/Metformin, Avandament, Glucavance, please do not take 48 hours after completing test unless otherwise instructed.  Please allow 2-4 weeks for scheduling of routine cardiac CTs. Some insurance companies require a pre-authorization which may delay scheduling of this test.   For non-scheduling related questions, please contact the cardiac imaging nurse navigator should you have any questions/concerns: Rockwell Alexandria, Cardiac Imaging Nurse Navigator Larey Brick, Cardiac Imaging Nurse Navigator [facility] Direct Office Dial: [phone]   For scheduling needs, including cancellations and rescheduling, please call [place], [phone].    Follow-Up: At [facility], you and your health needs are our priority.  As part of our continuing mission to provide you with exceptional heart care, we have created designated Provider Care Teams.  These Care Teams include your primary Cardiologist (physician) and Advanced Practice Providers (APPs -  Physician Assistants and Nurse Practitioners) who all work together to provide you with the care you need, when you need it.  We recommend signing up for the patient portal called "MyChart".  Sign up information is provided on this After Visit  Summary.  MyChart is used to connect with patients for Virtual Visits (Telemedicine).  Patients are able to view lab/test results, encounter notes, upcoming appointments, etc.  Non-urgent messages can be sent to your provider as well.   To learn more about what you can do with MyChart, go to  [email or web address].    Your next appointment:   8 week(s)  The format for your next appointment:   In Person  Provider:   Norman Herrlich, MD    Other Instructions

## 2021-07-26 ENCOUNTER — Other Ambulatory Visit (HOSPITAL_COMMUNITY): Payer: Self-pay | Admitting: *Deleted

## 2021-08-05 ENCOUNTER — Telehealth (HOSPITAL_COMMUNITY): Payer: Self-pay | Admitting: *Deleted

## 2021-08-05 NOTE — Telephone Encounter (Signed)
Reaching out to patient to offer assistance regarding upcoming cardiac imaging study; pt handed phone to her daughter who verbalizes understanding of appt date/time, parking situation and where to check in, pre-test NPO status and medications ordered, and verified current allergies; name and call back number provided for further questions should they arise  Larey BrickMerle Roddie Riegler RN Navigator Cardiac Imaging [facility] [phone] office [phone] cell  Patient's daughter is aware the patient is to arrive at 11:30am for her noon CT scan.

## 2021-08-06 ENCOUNTER — Other Ambulatory Visit: Payer: Self-pay | Admitting: Cardiovascular Disease

## 2021-08-06 ENCOUNTER — Ambulatory Visit (HOSPITAL_COMMUNITY)
Admission: RE | Admit: 2021-08-06 | Discharge: 2021-08-06 | Disposition: A | Discharge status: Home / Self Care | Payer: Medicare Other | Source: Ambulatory Visit | Attending: Cardiology | Admitting: Cardiology

## 2021-08-06 ENCOUNTER — Other Ambulatory Visit: Payer: Self-pay

## 2021-08-06 DIAGNOSIS — I2510 Atherosclerotic heart disease of native coronary artery without angina pectoris: Secondary | ICD-10-CM | POA: Diagnosis not present

## 2021-08-06 DIAGNOSIS — R079 Chest pain, unspecified: Secondary | ICD-10-CM | POA: Diagnosis present

## 2021-08-06 DIAGNOSIS — R072 Precordial pain: Secondary | ICD-10-CM | POA: Diagnosis not present

## 2021-08-06 DIAGNOSIS — R931 Abnormal findings on diagnostic imaging of heart and coronary circulation: Secondary | ICD-10-CM | POA: Insufficient documentation

## 2021-08-06 MED ORDER — IOHEXOL 350 MG/ML SOLN
95.0000 mL | Freq: Once | INTRAVENOUS | Status: AC | PRN
  Administered 2021-08-06: 95 mL via INTRAVENOUS

## 2021-08-06 MED ORDER — NITROGLYCERIN 0.4 MG SL SUBL
SUBLINGUAL_TABLET | SUBLINGUAL | Status: AC
  Filled 2021-08-06: qty 1

## 2021-08-06 MED ORDER — NITROGLYCERIN 0.4 MG SL SUBL
0.8000 mg | SUBLINGUAL_TABLET | Freq: Once | SUBLINGUAL | Status: AC
  Administered 2021-08-06: 0.8 mg via SUBLINGUAL

## 2021-08-06 NOTE — Progress Notes (Unsigned)
CT FFR ordered.   Gerri Spore T. Flora Lipps, MD, [facility]   [facility]  [address], [address] [place], [place] [address] [phone]  3:12 PM

## 2021-08-07 ENCOUNTER — Ambulatory Visit (HOSPITAL_COMMUNITY)
Admission: RE | Admit: 2021-08-07 | Discharge: 2021-08-07 | Disposition: A | Discharge status: Home / Self Care | Payer: Medicare Other | Source: Ambulatory Visit | Attending: Cardiovascular Disease | Admitting: Cardiovascular Disease

## 2021-08-07 DIAGNOSIS — R931 Abnormal findings on diagnostic imaging of heart and coronary circulation: Secondary | ICD-10-CM

## 2021-08-07 DIAGNOSIS — R072 Precordial pain: Secondary | ICD-10-CM | POA: Diagnosis not present

## 2021-08-12 ENCOUNTER — Telehealth: Payer: Self-pay

## 2021-08-12 NOTE — Telephone Encounter (Signed)
Left VM for pt to call back.

## 2021-08-12 NOTE — Telephone Encounter (Signed)
-----   Message from Baldo Daub, MD sent at 08/11/2021 12:06 PM EST ----- Her cardiac CTA is abnormal there is blockage severe in one of her arteries left anterior descending  I am not in the the med [facility] office in the next month  We should work her into the schedule was over there this week to be seen recommendation to undergo heart catheterization from the report

## 2021-08-15 ENCOUNTER — Other Ambulatory Visit: Payer: Self-pay

## 2021-08-19 ENCOUNTER — Ambulatory Visit (INDEPENDENT_AMBULATORY_CARE_PROVIDER_SITE_OTHER): Payer: Medicare Other | Admitting: Cardiology

## 2021-08-19 ENCOUNTER — Encounter: Payer: Self-pay | Admitting: Cardiology

## 2021-08-19 ENCOUNTER — Other Ambulatory Visit: Payer: Self-pay

## 2021-08-19 VITALS — BP 126/70 | HR 65 | Ht 60.0 in | Wt 147.0 lb

## 2021-08-19 DIAGNOSIS — E119 Type 2 diabetes mellitus without complications: Secondary | ICD-10-CM

## 2021-08-19 DIAGNOSIS — I25118 Atherosclerotic heart disease of native coronary artery with other forms of angina pectoris: Secondary | ICD-10-CM | POA: Diagnosis not present

## 2021-08-19 DIAGNOSIS — I2510 Atherosclerotic heart disease of native coronary artery without angina pectoris: Secondary | ICD-10-CM | POA: Insufficient documentation

## 2021-08-19 DIAGNOSIS — I10 Essential (primary) hypertension: Secondary | ICD-10-CM

## 2021-08-19 MED ORDER — ATORVASTATIN CALCIUM 80 MG PO TABS: 80.0000 mg | ORAL_TABLET | Freq: Every day | ORAL | 3 refills | Status: AC

## 2021-08-19 MED ORDER — ISOSORBIDE MONONITRATE ER 30 MG PO TB24: 30.0000 mg | ORAL_TABLET | Freq: Every day | ORAL | 3 refills | Status: DC

## 2021-08-19 NOTE — Patient Instructions (Signed)
Medication Instructions:  ?Your physician has recommended you make the following change in your medication:  ? ?START: Lipitor 80 mg daily ?START: Imdur 30 mg daily ? ?*If you need a refill on your cardiac medications before your next appointment, please call your pharmacy* ? ? ?Lab Work: ?Your physician recommends that you return for lab work in:  ? ?Labs today: BMP, CBC ? ?If you have labs (blood work) drawn today and your tests are completely normal, you will receive your results only by: ?MyChart Message (if you have MyChart) OR ?A paper copy in the mail ?If you have any lab test that is abnormal or we need to change your treatment, we will call you to review the results. ? ? ?Testing/Procedures: ? ?[facility] ?[facility] [place] ?[address], [address] ?[place] [id number] ?Dept: [phone] ?Loc: [phone]: [phone] ? ?Meredith Phillips  08/19/2021 ? ?You are scheduled for a Cardiac Catheterization on Monday, March 13 with Dr. Lance MussJayadeep Varanasi. ? ?1. Please arrive at the Main Entrance A at [facility]: [address] [place], [place] [id number] at 5:30 AM (This time is two hours before your procedure to ensure your preparation). Free valet parking service is available.  ? ?Special note: Every effort is made to have your procedure done on time. Please understand that emergencies sometimes delay scheduled procedures. ? ?2. Diet: Do not eat solid foods after midnight.  You may have clear liquids until 5 AM upon the day of the procedure. ? ?3. Labs: You will need to have blood drawn on Monday, March 6 at [facility]: [address], [address], [place]. You do not need to be fasting. ? ?4. Medication instructions in preparation for your procedure: ? ? Contrast Allergy: No ? ? ?On the morning of your procedure, take Aspirin and any morning medicines NOT listed above.  You may use sips of water. ? ?5. Plan to go home the same day, you will  only stay overnight if medically necessary. ?6. You MUST have a responsible adult to drive you home. ?7. An adult MUST be with you the first 24 hours after you arrive home. ?8. Bring a current list of your medications, and the last time and date medication taken. ?9. Bring ID and current insurance cards. ?10.Please wear clothes that are easy to get on and off and wear slip-on shoes. ? ?Thank you for allowing us to care for you! ?  -- [facility] ? ? ? ?Follow-Up: ?At [facility], you and your health needs are our priority.  As part of our continuing mission to provide you with exceptional heart care, we have created designated Provider Care Teams.  These Care Teams include your primary Cardiologist (physician) and Advanced Practice Providers (APPs -  Physician Assistants and Nurse Practitioners) who all work together to provide you with the care you need, when you need it. ? ?We recommend signing up for the patient portal called "MyChart".  Sign up information is provided on this After Visit Summary.  MyChart is used to connect with patients for Virtual Visits (Telemedicine).  Patients are able to view lab/test results, encounter notes, upcoming appointments, etc.  Non-urgent messages can be sent to your provider as well.   ?To learn more about what you can do with MyChart, go to [email or web address].   ? ?Your next appointment:   ?2 month(s) ? ?The format for your next appointment:   ?In  Person ? ?Provider:   ?Gypsy Balsam, MD  ? ? ?Other Instructions ?None ? ?

## 2021-08-19 NOTE — Progress Notes (Signed)
Cardiology Office Note:    Date:  08/19/2021   ID:  Brittnye, Josephs [birth year], MRN [id number]  PCP:  Governor Specking, MD  Cardiologist:  Jenne Campus, MD    Referring MD: Governor Specking, MD   Chief Complaint  Patient presents with   abnormal stress test  Abnormal coronary CT angio  History of Present Illness:    Meredith Phillips is a 76 y.o. female  who is being seen today for the evaluation of chest pain at the request of Smoot, Leary Roca, PA-C.   She was seen by Dr. Skeet Latch consultation 01/02/2021.  She did not have acute coronary syndrome previous myocardial perfusion imaging was normal and she did not have a repeat ischemia evaluation at that time.   She was seen [facility] 03/03/2021 her EKG showed sinus rhythm left bundle branch block she was advised admission to the hospital the patient left the hospital [facility]. High-sensitivity troponin and repeat were both low at 4.  BMP showed a creatinine of 1.11 GFR 51 cc random glucose elevated at 131 hemoglobin 13.5 Chest x-ray showed cardiomegaly.   Previous cardiology evaluation 01/03/2021 echocardiogram showed normal left ventricular size mild concentric LVH EF 00% grade 1 diastolic dysfunction.  Right ventricle normal in size and function.  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   Myocardial perfusion imaging 03/27/2018 was low risk EF 69% normal perfusion pharmacologic Lexiscan test.  Eventually after last visit she had coronary CT angio done coronary CT angio showed critical stenosis midportion of LAD.  She was invited to my office today to talk about it.  Later get numerous episode of chest pain.  She does have numerous visits in the emergency room.  The way she described pain some of those have very worrisome  characteristic but some look very atypical.  I had a long discussion with her as well as with her daughter who is in the room with her I wanted her to be admitted to the hospital proceed with cardiac catheterization did not want to do it they prefer to wait until next week.  I did explain procedure including all risk benefits as well as alternatives.  Past Medical History:  Diagnosis Date   Anxiety    Arthritis    B12 deficiency    Benign essential HTN 02/18/2015   Cellulitis 02/18/2015   Chest pain    Chicken pox    Chronic fatigue    Dementia ([place])    Depression    since last 04/2018 daughter passed.     Depression    Diabetes mellitus without complication ([place])    Diabetic peripheral neuropathy (HCC)    GERD (gastroesophageal reflux disease)    Hospital discharge follow-up 02/01/2021   Hypercholesteremia    Hypertension    Liver disease    Muscle spasms of both lower extremities 12/28/2020   Polyp of colon     Past Surgical History:  Procedure Laterality Date   APPENDECTOMY     HERNIA REPAIR     KNEE SURGERY     ROTATOR CUFF REPAIR     TONSILLECTOMY      Current Medications: Current Meds  Medication Sig   amLODipine (NORVASC) 10 MG tablet TAKE 1 TABLET BY MOUTH EVERY DAY   aspirin EC 81 MG tablet Take  81 mg by mouth daily.   atorvastatin (LIPITOR) 20 MG tablet TAKE 1 TABLET BY MOUTH EVERY DAY (Patient taking differently: Take 20 mg by mouth daily.)   blood glucose meter kit and supplies Dispense based on patient and insurance preference. Use up to four times daily as directed. (FOR ICD-10 E10.9, E11.9).   Blood Glucose Monitoring Suppl (ONE TOUCH ULTRA 2) w/Device KIT Use to check blood sugars twice a day Dx E11.9   carvedilol (COREG) 12.5 MG tablet Take 1 tablet (12.5 mg total) by mouth 2 (two) times daily with a meal.   Cholecalciferol (VITAMIN D-3) 1000 UNITS CAPS Take 1,000 Units by mouth.   cloNIDine (CATAPRES) 0.1 MG tablet Take 0.1 mg by mouth 2 (two) times  daily.   cyanocobalamin (,VITAMIN B-12,) 1000 MCG/ML injection Inject 1 mL into the muscle every 30 (thirty) days.   cyanocobalamin 1000 MCG tablet Take 1,000 mcg by mouth daily.   donepezil (ARICEPT) 5 MG tablet Take 5 mg by mouth daily.   esomeprazole (NEXIUM) 40 MG capsule TAKE 1 CAPSULE (40 MG TOTAL) BY MOUTH DAILY AT 12 NOON.   famotidine (PEPCID) 20 MG tablet TAKE 1 TABLET BY MOUTH TWICE A DAY   FREESTYLE LITE test strip USE AS DIRECTED UP TO 4 TIMES DAILY (Patient taking differently: 1 each by Other route as needed. USE AS DIRECTED UP TO 4 TIMES DAILY)   gabapentin (NEURONTIN) 300 MG capsule Take 1 capsule (300 mg total) by mouth 3 (three) times daily. TAKE 1 CAPSULE BY MOUTH THREE TIMES A DAY   JANUVIA 100 MG tablet TAKE 1 TABLET BY MOUTH EVERY DAY (Patient taking differently: Take 100 mg by mouth daily.)   KLOR-CON M10 10 MEQ tablet Take 10 mEq by mouth 2 (two) times daily.   Lancets (ONETOUCH ULTRASOFT) lancets USE TO HELP CHECK BLOOD SUGARS TWICE A DAY E11.9 (Patient taking differently: 1 each by Other route as needed for other. Use to help check blood sugars twice a day E11.9)   lisinopril (ZESTRIL) 20 MG tablet Take 20 mg by mouth daily.   meclizine (ANTIVERT) 25 MG tablet TAKE 1/2 TO 1 TABLET BY MOUTH TWICE A DAY AS NEEDED FOR DIZZINESS (Patient taking differently: Take 25 mg by mouth 2 (two) times daily as needed for dizziness. TAKE 1/2 TO 1 TABLET BY MOUTH TWICE A DAY AS NEEDED FOR DIZZINESS)   metoprolol succinate (TOPROL-XL) 25 MG 24 hr tablet Take 25 mg by mouth daily.   nitroGLYCERIN (NITROSTAT) 0.4 MG SL tablet Place 0.4 mg under the tongue as needed for chest pain.   telmisartan (MICARDIS) 80 MG tablet Take 80 mg by mouth daily.     Allergies:   Codeine, Januvia [sitagliptin], Other, and Shellfish allergy   Social History   Socioeconomic History   Marital status: Married    Spouse name: Not on file   Number of children: 5   Years of education: 12   Highest education  level: Not on file  Occupational History   Occupation: Retired  Tobacco Use   Smoking status: Never   Smokeless tobacco: Never  Substance and Sexual Activity   Alcohol use: No   Drug use: No   Sexual activity: Not on file  Other Topics Concern   Not on file  Social History Narrative   Fun: paint, word search   Feels safe where she is living and no concern for abuse   Denies religious beliefs effecting healthcare.    Social Determinants of Health  Financial Resource Strain: Not on file  Food Insecurity: Not on file  Transportation Needs: Not on file  Physical Activity: Not on file  Stress: Not on file  Social Connections: Not on file     Family History: The patient's family history includes Healthy in her father, mother, paternal grandfather, and paternal grandmother. ROS:   Please see the history of present illness.    All 14 point review of systems negative except as described per history of present illness  EKGs/Labs/Other Studies Reviewed:      Recent Labs: 01/02/2021: ALT 11 03/25/2021: BUN 15; Creatinine, Ser 1.30; Hemoglobin 12.4; Platelets 289; Potassium 3.3; Sodium 136  Recent Lipid Panel    Component Value Date/Time   CHOL 249 (H) 09/23/2018 0800   TRIG 67.0 09/23/2018 0800   HDL 66.20 09/23/2018 0800   CHOLHDL 4 09/23/2018 0800   VLDL 13.4 09/23/2018 0800   LDLCALC 170 (H) 09/23/2018 0800    Physical Exam:    VS:  BP 126/70 (BP Location: Right Arm, Patient Position: Sitting)    Pulse 65    Ht 5' (1.524 m)    Wt 147 lb (66.7 kg)    SpO2 98%    BMI 28.71 kg/m     Wt Readings from Last 3 Encounters:  08/19/21 147 lb (66.7 kg)  05/31/21 152 lb (68.9 kg)  03/25/21 140 lb (63.5 kg)     GEN:  Well nourished, well developed in no acute distress HEENT: Normal NECK: No JVD; No carotid bruits LYMPHATICS: No lymphadenopathy CARDIAC: RRR, no murmurs, no rubs, no gallops RESPIRATORY:  Clear to auscultation without rales, wheezing or rhonchi  ABDOMEN:  Soft, non-tender, non-distended MUSCULOSKELETAL:  No edema; No deformity  SKIN: Warm and dry LOWER EXTREMITIES: no swelling NEUROLOGIC:  Alert and oriented x 3 PSYCHIATRIC:  Normal affect   ASSESSMENT:    1. Benign essential HTN   2. Coronary artery disease of native artery of native heart with stable angina pectoris ([place])   3. Type 2 diabetes mellitus without complication, without long-term current use of insulin (HCC)    PLAN:    In order of problems listed above:  Coronary artery disease mid LAD lesion which is hemodynamically significant.  They do not want to have a cardiac catheterization within the next few days they prefer to wait until next week.  In the meantime I will increase dose of Lipitor to 80 mg daily, I will make sure she does have nitroglycerin to take it on as-needed basis.  I will put her on long-acting nitroglycerin as well.  I told her daughter repeatedly that if she start having more chest pain she did bring her to the emergency room.  I am very unhappy about the situation lady does have a lot of symptoms and significant mid LAD lesion.  But still both her as well as her daughter did not want to pursue quickly to cardiac catheterization. Benign essential hypertension: Blood pressure well controlled continue present management. Dyslipidemia I will increase the dose of Lipitor to 80 mg daily I did review K PN however the data I have is from 2020 showing LDL of 170 Diabetes mellitus that being followed by internal medicine team.  I do not have any recent data on that.   Medication Adjustments/Labs and Tests Ordered: Current medicines are reviewed at length with the patient today.  Concerns regarding medicines are outlined above.  No orders of the defined types were placed in this encounter.  Medication changes: No orders  of the defined types were placed in this encounter.   Signed, Park Liter, MD, [facility] 08/19/2021 1:41 PM    [place]

## 2021-08-20 LAB — BASIC METABOLIC PANEL
BUN/Creatinine Ratio: 11 — ABNORMAL LOW (ref 12–28)
BUN: 11 mg/dL (ref 8–27)
CO2: 25 mmol/L (ref 20–29)
Calcium: 9.6 mg/dL (ref 8.7–10.3)
Chloride: 102 mmol/L (ref 96–106)
Creatinine, Ser: 1.01 mg/dL — ABNORMAL HIGH (ref 0.57–1.00)
Glucose: 119 mg/dL — ABNORMAL HIGH (ref 70–99)
Potassium: 4 mmol/L (ref 3.5–5.2)
Sodium: 141 mmol/L (ref 134–144)
eGFR: 57 mL/min/{1.73_m2} — ABNORMAL LOW (ref >59)

## 2021-08-20 LAB — CBC
Hematocrit: 34.6 % (ref 34.0–46.6)
Hemoglobin: 11.8 g/dL (ref 11.1–15.9)
MCH: 29.1 pg (ref 26.6–33.0)
MCHC: 34.1 g/dL (ref 31.5–35.7)
MCV: 85 fL (ref 79–97)
Platelets: 388 10*3/uL (ref 150–450)
RBC: 4.05 x10E6/uL (ref 3.77–5.28)
RDW: 13 % (ref 11.7–15.4)
WBC: 8.6 10*3/uL (ref 3.4–10.8)

## 2021-08-22 ENCOUNTER — Telehealth: Payer: Self-pay | Admitting: *Deleted

## 2021-08-22 NOTE — Telephone Encounter (Signed)
Call placed to patient to review cath instructions for 08/26/21. ?Patient answered phone and asked me to speak with her husband. ?Patient's husband then asked me to call their daughter, Meredith Phillips, [phone]. ?Meredith Phillips requests to cancel cath 08/26/21, reports she called [employer] 2 days ago and requested cath be cancelled. ?Meredith Phillips reports patient has an appointment with another cardiologist on Saturday. ? ?I called back and spoke with patient's husband, he confirmed patient did want to cancel cath scheduled 08/26/21. ?As requested by family, I have cancelled cath scheduled 08/26/21 at [facility]. ?[place] and husband aware I have cancelled 08/26/21 cath, will forward to Dr Agustin Cree for his review.  ? ?

## 2021-08-26 ENCOUNTER — Encounter (HOSPITAL_COMMUNITY): Admission: RE | Payer: Self-pay | Source: Home / Self Care

## 2021-08-26 ENCOUNTER — Ambulatory Visit (HOSPITAL_COMMUNITY)
Admission: RE | Admit: 2021-08-26 | Payer: Medicare Other | Source: Home / Self Care | Admitting: Interventional Cardiology

## 2021-08-26 SURGERY — LEFT HEART CATH AND CORONARY ANGIOGRAPHY
Anesthesia: LOCAL

## 2021-08-28 ENCOUNTER — Ambulatory Visit: Payer: Medicare Other | Admitting: Cardiology

## 2021-09-11 ENCOUNTER — Encounter: Payer: Self-pay | Admitting: Cardiovascular Disease

## 2021-09-11 ENCOUNTER — Ambulatory Visit: Payer: Medicare Other | Admitting: Cardiovascular Disease

## 2021-09-11 DIAGNOSIS — I10 Essential (primary) hypertension: Secondary | ICD-10-CM

## 2021-09-11 DIAGNOSIS — I25110 Atherosclerotic heart disease of native coronary artery with unstable angina pectoris: Secondary | ICD-10-CM

## 2021-09-11 DIAGNOSIS — E782 Mixed hyperlipidemia: Secondary | ICD-10-CM | POA: Diagnosis not present

## 2021-09-11 MED ORDER — ISOSORBIDE MONONITRATE ER 60 MG PO TB24: 60.0000 mg | ORAL_TABLET | Freq: Every day | ORAL | 3 refills | Status: AC

## 2021-09-11 NOTE — Assessment & Plan Note (Signed)
History of hyperlipidemia on statin therapy with lipid profile performed 09/23/2018 revealing a total cholesterol of 249, LDL of 170 and HDL of 66.  This will need to be addressed postintervention. ?

## 2021-09-11 NOTE — Assessment & Plan Note (Signed)
History of CAD with coronary CTA performed 08/06/2021 revealing a coronary calcium score 388 with a severe mid LAD stenosis.  Based on this and the fact that she is having chest pain every other day we talked about proceeding with outpatient radial diagnostic coronary angiography.? I have reviewed the risks, indications, and alternatives to cardiac catheterization, possible angioplasty, and stenting with the patient. Risks include but are not limited to bleeding, infection, vascular injury, stroke, myocardial infection, arrhythmia, kidney injury, radiation-related injury in the case of prolonged fluoroscopy use, emergency cardiac surgery, and death. The patient understands the risks of serious complication is 1-2 in 1000 with diagnostic cardiac cath and 1-2% or less with angioplasty/stenting.  ?

## 2021-09-11 NOTE — Progress Notes (Signed)
? ? ? ?09/11/2021 ?Meredith Phillips   ?1945-10-18  ?[id number] ? ?Primary Physician Governor Specking, MD ?Primary Cardiologist: Lorretta Harp MD Meredith Phillips, [place] ? ?HPI:  Meredith Phillips is a 76 y.o.   thin-appearing married African-American female mother of 51, grandmother of 8 grandchildren who is accompanied by her daughter Meredith Phillips today.  She was referred by Dr. Claudie Leach for cardiac catheterization.  She has a history of treated hypertension, diabetes and hyperlipidemia.  She is never had a heart attack or stroke.  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  She did have a coronary CTA performed 08/06/2021 revealed a coronary calcium score of 388 with a severe mid LAD stenosis.  She was referred for diagnostic coronary angiography.  She does get pain on every other day basis. ? ? ?Current Meds  ?Medication Sig  ? amLODipine (NORVASC) 10 MG tablet TAKE 1 TABLET BY MOUTH EVERY DAY  ? aspirin EC 81 MG tablet Take 81 mg by mouth daily.  ? atorvastatin (LIPITOR) 80 MG tablet Take 1 tablet (80 mg total) by mouth daily.  ? blood glucose meter kit and supplies Dispense based on patient and insurance preference. Use up to four times daily as directed. (FOR ICD-10 E10.9, E11.9).  ? Blood Glucose Monitoring Suppl (ONE TOUCH ULTRA 2) w/Device KIT Use to check blood sugars twice a day Dx E11.9  ? carvedilol (COREG) 12.5 MG tablet Take 1 tablet (12.5 mg total) by mouth 2 (two) times daily with a meal.  ? Cholecalciferol (VITAMIN D-3) 1000 UNITS CAPS Take 1,000 Units by mouth.  ? cloNIDine (CATAPRES) 0.1 MG tablet Take 0.1 mg by mouth 2 (two) times daily.  ? cyanocobalamin (,VITAMIN B-12,) 1000 MCG/ML injection Inject 1 mL into the muscle every 30 (thirty) days.  ? donepezil (ARICEPT) 5 MG tablet Take 5 mg by mouth daily.  ? esomeprazole (NEXIUM) 40 MG capsule TAKE 1 CAPSULE (40 MG TOTAL) BY MOUTH DAILY AT 12 NOON.  ? famotidine (PEPCID) 20 MG tablet TAKE 1 TABLET BY MOUTH  TWICE A DAY  ? gabapentin (NEURONTIN) 300 MG capsule Take 1 capsule (300 mg total) by mouth 3 (three) times daily. TAKE 1 CAPSULE BY MOUTH THREE TIMES A DAY  ? isosorbide mononitrate (IMDUR) 30 MG 24 hr tablet Take 1 tablet (30 mg total) by mouth daily.  ? JANUVIA 100 MG tablet TAKE 1 TABLET BY MOUTH EVERY DAY (Patient taking differently: Take 100 mg by mouth daily.)  ? KLOR-CON M10 10 MEQ tablet Take 10 mEq by mouth 2 (two) times daily.  ? Lancets (ONETOUCH ULTRASOFT) lancets USE TO HELP CHECK BLOOD SUGARS TWICE A DAY E11.9 (Patient taking differently: 1 each by Other route as needed for other. Use to help check blood sugars twice a day E11.9)  ? lisinopril (ZESTRIL) 20 MG tablet Take 20 mg by mouth daily.  ? meclizine (ANTIVERT) 25 MG tablet TAKE 1/2 TO 1 TABLET BY MOUTH TWICE A DAY AS NEEDED FOR DIZZINESS (Patient taking differently: Take 25 mg by mouth 2 (two) times daily as needed for dizziness. TAKE 1/2 TO 1 TABLET BY MOUTH TWICE A DAY AS NEEDED FOR DIZZINESS)  ? metoprolol succinate (TOPROL-XL) 25 MG 24 hr tablet Take 25 mg by mouth daily.  ? telmisartan (MICARDIS) 80 MG tablet Take 80 mg by mouth daily.  ?  ? ?Allergies  ?Allergen Reactions  ? Codeine Other (See Comments)  ?  Woke up  the next morning and did not know where she was  ? Januvia [Sitagliptin] Itching  ? Other   ?  Doesn't like to take pain medication due to hallucinations.  ? Shellfish Allergy   ? ? ?Social History  ? ?Socioeconomic History  ? Marital status: Married  ?  Spouse name: Not on file  ? Number of children: 5  ? Years of education: 29  ? Highest education level: Not on file  ?Occupational History  ? Occupation: Retired  ?Tobacco Use  ? Smoking status: Never  ? Smokeless tobacco: Never  ?Substance and Sexual Activity  ? Alcohol use: No  ? Drug use: No  ? Sexual activity: Not on file  ?Other Topics Concern  ? Not on file  ?Social History Narrative  ? Fun: paint, word search  ? Feels safe where she is living and no concern for abuse  ?  Denies religious beliefs effecting healthcare.   ? ?Social Determinants of Health  ? ?Financial Resource Strain: Not on file  ?Food Insecurity: Not on file  ?Transportation Needs: Not on file  ?Physical Activity: Not on file  ?Stress: Not on file  ?Social Connections: Not on file  ?Intimate Partner Violence: Not on file  ?  ? ?Review of Systems: ?General: negative for chills, fever, night sweats or weight changes.  ?Cardiovascular: negative for chest pain, dyspnea on exertion, edema, orthopnea, palpitations, paroxysmal nocturnal dyspnea or shortness of breath ?Dermatological: negative for rash ?Respiratory: negative for cough or wheezing ?Urologic: negative for hematuria ?Abdominal: negative for nausea, vomiting, diarrhea, bright red blood per rectum, melena, or hematemesis ?Neurologic: negative for visual changes, syncope, or dizziness ?All other systems reviewed and are otherwise negative except as noted above. ? ? ? ?Blood pressure (!) 160/90, pulse 61, height 5' (1.524 m), weight 147 lb 3.2 oz (66.8 kg), SpO2 99 %.  ?General appearance: alert and no distress ?Neck: no adenopathy, no carotid bruit, no JVD, supple, symmetrical, trachea midline, and thyroid not enlarged, symmetric, no tenderness/mass/nodules ?Lungs: clear to auscultation bilaterally ?Heart: regular rate and rhythm, S1, S2 normal, no murmur, click, rub or gallop ?Extremities: extremities normal, atraumatic, no cyanosis or edema ?Pulses: 2+ and symmetric ?Skin: Skin color, texture, turgor normal. No rashes or lesions ?Neurologic: Grossly normal ? ?EKG sinus rhythm at 61 with left bundle branch block.  I personally reviewed this EKG. ? ?ASSESSMENT AND PLAN:  ? ?Benign essential HTN ?History of essential hypertension blood pressure measured today at 160/90.  She is on amlodipine, carvedilol, Catapres patch and lisinopril. ? ?Hyperlipidemia ?History of hyperlipidemia on statin therapy with lipid profile performed 09/23/2018 revealing a total cholesterol  of 249, LDL of 170 and HDL of 66.  This will need to be addressed postintervention. ? ?Coronary artery disease hemodynamically significant mid LAD based on coronary CT on February 2023 ?History of CAD with coronary CTA performed 08/06/2021 revealing a coronary calcium score 388 with a severe mid LAD stenosis.  Based on this and the fact that she is having chest pain every other day we talked about proceeding with outpatient radial diagnostic coronary angiography.  I have reviewed the risks, indications, and alternatives to cardiac catheterization, possible angioplasty, and stenting with the patient. Risks include but are not limited to bleeding, infection, vascular injury, stroke, myocardial infection, arrhythmia, kidney injury, radiation-related injury in the case of prolonged fluoroscopy use, emergency cardiac surgery, and death. The patient understands the risks of serious complication is 1-2 in 7048 with diagnostic cardiac cath and 1-2% or less  with angioplasty/stenting.  ? ? ? ? ?Lorretta Harp MD FACP,FACC,FAHA, FSCAI ?09/11/2021 ?2:24 PM ?

## 2021-09-11 NOTE — Patient Instructions (Signed)
Medication Instructions:  ? ?-Increase isosorbide mononitrate (imdur) to 60mg  once daily. ? ?*If you need a refill on your cardiac medications before your next appointment, please call your pharmacy* ? ? ?Lab Work: ?Your physician recommends that you have labs drawn today: BMET and CBC ? ?If you have labs (blood work) drawn today and your tests are completely normal, you will receive your results only by: ?MyChart Message (if you have MyChart) OR ?A paper copy in the mail ?If you have any lab test that is abnormal or we need to change your treatment, we will call you to review the results. ? ? ?Testing/Procedures: ?See below ? ? ?Follow-Up: ?At [facility], you and your health needs are our priority.  As part of our continuing mission to provide you with exceptional heart care, we have created designated Provider Care Teams.  These Care Teams include your primary Cardiologist (physician) and Advanced Practice Providers (APPs -  Physician Assistants and Nurse Practitioners) who all work together to provide you with the care you need, when you need it. ? ?We recommend signing up for the patient portal called "MyChart".  Sign up information is provided on this After Visit Summary.  MyChart is used to connect with patients for Virtual Visits (Telemedicine).  Patients are able to view lab/test results, encounter notes, upcoming appointments, etc.  Non-urgent messages can be sent to your provider as well.   ?To learn more about what you can do with MyChart, go to [email or web address].   ? ?Your next appointment:   ?2-3 week(s) after your heart cath procedure ? ?The format for your next appointment:   ?In Person ? ?Provider:   ?Quay Burow, MD ? ? ?Other Instructions ? ?[facility] ?[place] ?[address] ?[place] [address] ?Dept: [phone] ?LocMN:[id number] ? ?Ventura Ogburn Iden  09/11/2021 ? ?You are scheduled for a Cardiac  Catheterization on Monday, April 17 with Dr. Quay Burow. ? ?1. Please arrive at the Main Entrance A at [facility]: [place], [place] [address] at 5:30 AM (This time is two hours before your procedure to ensure your preparation). Free valet parking service is available.  ? ?Special note: Every effort is made to have your procedure done on time. Please understand that emergencies sometimes delay scheduled procedures. ? ?2. Diet: Do not eat solid foods after midnight.  You may have clear liquids until 5 AM upon the day of the procedure. ? ?3. Labs: You will need to have blood drawn today. ? ? ?4. Medication instructions in preparation for your procedure: ? ?Hold Januvia on the day of the procedure. ? ? ?On the morning of your procedure, take Aspirin and any morning medicines NOT listed above.  You may use sips of water. ? ?5. Plan to go home the same day, you will only stay overnight if medically necessary. ?6. You MUST have a responsible adult to drive you home. ?7. An adult MUST be with you the first 24 hours after you arrive home. ?8. Bring a current list of your medications, and the last time and date medication taken. ?9. Bring ID and current insurance cards. ?10.Please wear clothes that are easy to get on and off and wear slip-on shoes. ? ?Thank you for allowing [place] to care for you! ?  -- [facility] ? ? ?

## 2021-09-11 NOTE — Assessment & Plan Note (Signed)
History of essential hypertension blood pressure measured today at 160/90.  She is on amlodipine, carvedilol, Catapres patch and lisinopril. ?

## 2021-09-12 ENCOUNTER — Encounter: Payer: Self-pay | Admitting: *Deleted

## 2021-09-12 LAB — CBC
Hematocrit: 35.3 % (ref 34.0–46.6)
Hemoglobin: 11.7 g/dL (ref 11.1–15.9)
MCH: 29.5 pg (ref 26.6–33.0)
MCHC: 33.1 g/dL (ref 31.5–35.7)
MCV: 89 fL (ref 79–97)
Platelets: 345 10*3/uL (ref 150–450)
RBC: 3.96 x10E6/uL (ref 3.77–5.28)
RDW: 13.3 % (ref 11.7–15.4)
WBC: 6 10*3/uL (ref 3.4–10.8)

## 2021-09-12 LAB — BASIC METABOLIC PANEL
BUN/Creatinine Ratio: 7 — ABNORMAL LOW (ref 12–28)
BUN: 7 mg/dL — ABNORMAL LOW (ref 8–27)
CO2: 25 mmol/L (ref 20–29)
Calcium: 10.1 mg/dL (ref 8.7–10.3)
Chloride: 104 mmol/L (ref 96–106)
Creatinine, Ser: 0.98 mg/dL (ref 0.57–1.00)
Glucose: 97 mg/dL (ref 70–99)
Potassium: 4.3 mmol/L (ref 3.5–5.2)
Sodium: 142 mmol/L (ref 134–144)
eGFR: 59 mL/min/{1.73_m2} — ABNORMAL LOW (ref >59)

## 2021-09-17 ENCOUNTER — Other Ambulatory Visit: Payer: Self-pay

## 2021-09-17 DIAGNOSIS — I25110 Atherosclerotic heart disease of native coronary artery with unstable angina pectoris: Secondary | ICD-10-CM

## 2021-09-17 MED ORDER — SODIUM CHLORIDE 0.9% FLUSH: 3.0000 mL | Freq: Two times a day (BID) | INTRAVENOUS | Status: DC

## 2021-09-25 ENCOUNTER — Other Ambulatory Visit: Payer: Self-pay | Admitting: Medical

## 2021-09-25 DIAGNOSIS — K2100 Gastro-esophageal reflux disease with esophagitis, without bleeding: Secondary | ICD-10-CM

## 2021-09-27 ENCOUNTER — Telehealth: Payer: Self-pay

## 2021-09-27 NOTE — Telephone Encounter (Signed)
Left message for pt to call back to review pre-procedure instructions. ?

## 2021-09-30 ENCOUNTER — Other Ambulatory Visit: Payer: Self-pay

## 2021-09-30 ENCOUNTER — Ambulatory Visit (HOSPITAL_COMMUNITY)
Admission: RE | Admit: 2021-09-30 | Discharge: 2021-10-01 | Disposition: A | Discharge status: Home / Self Care | Payer: Medicare Other | Attending: Cardiovascular Disease | Admitting: Cardiovascular Disease

## 2021-09-30 ENCOUNTER — Ambulatory Visit (HOSPITAL_COMMUNITY)
Admission: RE | Disposition: A | Discharge status: Home / Self Care | Payer: Self-pay | Source: Home / Self Care | Attending: Cardiovascular Disease

## 2021-09-30 DIAGNOSIS — I10 Essential (primary) hypertension: Secondary | ICD-10-CM | POA: Insufficient documentation

## 2021-09-30 DIAGNOSIS — Z79899 Other long term (current) drug therapy: Secondary | ICD-10-CM | POA: Diagnosis not present

## 2021-09-30 DIAGNOSIS — Z7982 Long term (current) use of aspirin: Secondary | ICD-10-CM | POA: Insufficient documentation

## 2021-09-30 DIAGNOSIS — Z7984 Long term (current) use of oral hypoglycemic drugs: Secondary | ICD-10-CM | POA: Insufficient documentation

## 2021-09-30 DIAGNOSIS — E785 Hyperlipidemia, unspecified: Secondary | ICD-10-CM | POA: Diagnosis not present

## 2021-09-30 DIAGNOSIS — Z7902 Long term (current) use of antithrombotics/antiplatelets: Secondary | ICD-10-CM | POA: Insufficient documentation

## 2021-09-30 DIAGNOSIS — Z955 Presence of coronary angioplasty implant and graft: Secondary | ICD-10-CM | POA: Insufficient documentation

## 2021-09-30 DIAGNOSIS — I2510 Atherosclerotic heart disease of native coronary artery without angina pectoris: Secondary | ICD-10-CM | POA: Diagnosis present

## 2021-09-30 DIAGNOSIS — E119 Type 2 diabetes mellitus without complications: Secondary | ICD-10-CM | POA: Diagnosis not present

## 2021-09-30 DIAGNOSIS — I25118 Atherosclerotic heart disease of native coronary artery with other forms of angina pectoris: Secondary | ICD-10-CM | POA: Diagnosis not present

## 2021-09-30 DIAGNOSIS — I25110 Atherosclerotic heart disease of native coronary artery with unstable angina pectoris: Secondary | ICD-10-CM

## 2021-09-30 HISTORY — PX: CORONARY STENT INTERVENTION: CATH118234

## 2021-09-30 HISTORY — PX: LEFT HEART CATH AND CORONARY ANGIOGRAPHY: CATH118249

## 2021-09-30 LAB — POCT ACTIVATED CLOTTING TIME
Activated Clotting Time: 269 seconds
Activated Clotting Time: 558 seconds
Activated Clotting Time: 347 seconds

## 2021-09-30 LAB — GLUCOSE, CAPILLARY
Glucose-Capillary: 92 mg/dL (ref 70–99)
Glucose-Capillary: 120 mg/dL — ABNORMAL HIGH (ref 70–99)
Glucose-Capillary: 95 mg/dL (ref 70–99)
Glucose-Capillary: 79 mg/dL (ref 70–99)
Glucose-Capillary: 142 mg/dL — ABNORMAL HIGH (ref 70–99)

## 2021-09-30 SURGERY — LEFT HEART CATH AND CORONARY ANGIOGRAPHY
Anesthesia: LOCAL

## 2021-09-30 MED ORDER — HEPARIN (PORCINE) IN NACL 1000-0.9 UT/500ML-% IV SOLN
INTRAVENOUS | Status: AC
  Filled 2021-09-30: qty 1000

## 2021-09-30 MED ORDER — FAMOTIDINE 20 MG PO TABS
20.0000 mg | ORAL_TABLET | Freq: Two times a day (BID) | ORAL | Status: DC
Start: 2021-09-30 — End: 2021-10-01
  Administered 2021-09-30 – 2021-10-01 (×2): 20 mg via ORAL
  Filled 2021-09-30 (×2): qty 1

## 2021-09-30 MED ORDER — HEPARIN SODIUM (PORCINE) 1000 UNIT/ML IJ SOLN
INTRAMUSCULAR | Status: DC | PRN
  Administered 2021-09-30: 3500 [IU] via INTRAVENOUS
  Administered 2021-09-30: 4000 [IU] via INTRAVENOUS
  Administered 2021-09-30: 2500 [IU] via INTRAVENOUS

## 2021-09-30 MED ORDER — CARVEDILOL 12.5 MG PO TABS
12.5000 mg | ORAL_TABLET | Freq: Once | ORAL | Status: AC
Start: 2021-09-30 — End: 2021-09-30
  Administered 2021-09-30: 12.5 mg via ORAL
  Filled 2021-09-30 (×2): qty 1

## 2021-09-30 MED ORDER — ATORVASTATIN CALCIUM 80 MG PO TABS
80.0000 mg | ORAL_TABLET | Freq: Every day | ORAL | Status: DC
  Administered 2021-09-30 – 2021-10-01 (×2): 80 mg via ORAL
  Filled 2021-09-30 (×2): qty 1

## 2021-09-30 MED ORDER — LIDOCAINE HCL (PF) 1 % IJ SOLN
INTRAMUSCULAR | Status: AC
Start: 2021-09-30 — End: ?
  Filled 2021-09-30: qty 30

## 2021-09-30 MED ORDER — HYDRALAZINE HCL 20 MG/ML IJ SOLN: 10.0000 mg | INTRAMUSCULAR | Status: DC | PRN

## 2021-09-30 MED ORDER — ISOSORBIDE MONONITRATE ER 60 MG PO TB24: 60.0000 mg | ORAL_TABLET | Freq: Once | ORAL | Status: DC

## 2021-09-30 MED ORDER — CLONIDINE HCL 0.1 MG PO TABS
0.1000 mg | ORAL_TABLET | Freq: Once | ORAL | Status: AC
  Administered 2021-09-30: 0.1 mg via ORAL
  Filled 2021-09-30: qty 1

## 2021-09-30 MED ORDER — INSULIN ASPART 100 UNIT/ML IJ SOLN: 0.0000 [IU] | Freq: Three times a day (TID) | INTRAMUSCULAR | Status: DC

## 2021-09-30 MED ORDER — VERAPAMIL HCL 2.5 MG/ML IV SOLN
INTRA_ARTERIAL | Status: DC | PRN
  Administered 2021-09-30: 6 mL via INTRA_ARTERIAL
  Administered 2021-09-30: 4 mL via INTRA_ARTERIAL

## 2021-09-30 MED ORDER — ISOSORBIDE MONONITRATE ER 60 MG PO TB24
60.0000 mg | ORAL_TABLET | Freq: Once | ORAL | Status: AC
Start: 2021-09-30 — End: 2021-09-30
  Administered 2021-09-30: 60 mg via ORAL
  Filled 2021-09-30: qty 1

## 2021-09-30 MED ORDER — HEPARIN SODIUM (PORCINE) 1000 UNIT/ML IJ SOLN
INTRAMUSCULAR | Status: AC
  Filled 2021-09-30: qty 10

## 2021-09-30 MED ORDER — NITROGLYCERIN 1 MG/10 ML FOR IR/CATH LAB
INTRA_ARTERIAL | Status: AC
  Filled 2021-09-30: qty 10

## 2021-09-30 MED ORDER — AMLODIPINE BESYLATE 10 MG PO TABS: 10.0000 mg | ORAL_TABLET | Freq: Every day | ORAL | Status: DC

## 2021-09-30 MED ORDER — SODIUM CHLORIDE 0.9 % IV SOLN
250.0000 mL | INTRAVENOUS | Status: DC | PRN
  Administered 2021-09-30: 250 mL via INTRAVENOUS

## 2021-09-30 MED ORDER — ASPIRIN 81 MG PO CHEW
81.0000 mg | CHEWABLE_TABLET | ORAL | Status: AC
  Administered 2021-09-30: 81 mg via ORAL
  Filled 2021-09-30: qty 1

## 2021-09-30 MED ORDER — CLOPIDOGREL BISULFATE 300 MG PO TABS
ORAL_TABLET | ORAL | Status: AC
  Filled 2021-09-30: qty 2

## 2021-09-30 MED ORDER — IRBESARTAN 150 MG PO TABS
150.0000 mg | ORAL_TABLET | Freq: Every day | ORAL | Status: DC
  Administered 2021-09-30 – 2021-10-01 (×2): 150 mg via ORAL
  Filled 2021-09-30 (×2): qty 1

## 2021-09-30 MED ORDER — DIPHENHYDRAMINE HCL 50 MG/ML IJ SOLN
INTRAMUSCULAR | Status: AC
  Filled 2021-09-30: qty 1

## 2021-09-30 MED ORDER — LIDOCAINE HCL (PF) 1 % IJ SOLN
INTRAMUSCULAR | Status: DC | PRN
  Administered 2021-09-30: 2 mL via INTRADERMAL

## 2021-09-30 MED ORDER — ISOSORBIDE MONONITRATE ER 60 MG PO TB24
60.0000 mg | ORAL_TABLET | Freq: Every day | ORAL | Status: DC
  Administered 2021-10-01: 60 mg via ORAL
  Filled 2021-09-30: qty 1

## 2021-09-30 MED ORDER — METHYLPREDNISOLONE SODIUM SUCC 125 MG IJ SOLR
INTRAMUSCULAR | Status: AC
  Filled 2021-09-30: qty 2

## 2021-09-30 MED ORDER — LABETALOL HCL 5 MG/ML IV SOLN: 10.0000 mg | INTRAVENOUS | Status: DC | PRN

## 2021-09-30 MED ORDER — CLOPIDOGREL BISULFATE 300 MG PO TABS
ORAL_TABLET | ORAL | Status: DC | PRN
  Administered 2021-09-30: 600 mg via ORAL

## 2021-09-30 MED ORDER — METOPROLOL SUCCINATE ER 25 MG PO TB24: 25.0000 mg | ORAL_TABLET | Freq: Once | ORAL | Status: DC

## 2021-09-30 MED ORDER — SODIUM CHLORIDE 0.9 % IV SOLN: INTRAVENOUS | Status: AC

## 2021-09-30 MED ORDER — ONDANSETRON HCL 4 MG/2ML IJ SOLN: 4.0000 mg | Freq: Four times a day (QID) | INTRAMUSCULAR | Status: DC | PRN

## 2021-09-30 MED ORDER — VERAPAMIL HCL 2.5 MG/ML IV SOLN
INTRAVENOUS | Status: AC
Start: 2021-09-30 — End: ?
  Filled 2021-09-30: qty 2

## 2021-09-30 MED ORDER — ASPIRIN 81 MG PO CHEW: 81.0000 mg | CHEWABLE_TABLET | ORAL | Status: DC

## 2021-09-30 MED ORDER — SODIUM CHLORIDE 0.9 % WEIGHT BASED INFUSION: 1.0000 mL/kg/h | INTRAVENOUS | Status: DC

## 2021-09-30 MED ORDER — CLOPIDOGREL BISULFATE 75 MG PO TABS
75.0000 mg | ORAL_TABLET | Freq: Every day | ORAL | Status: DC
  Administered 2021-10-01: 75 mg via ORAL
  Filled 2021-09-30: qty 1

## 2021-09-30 MED ORDER — ASPIRIN 81 MG PO CHEW
81.0000 mg | CHEWABLE_TABLET | Freq: Every day | ORAL | Status: DC
  Administered 2021-10-01: 81 mg via ORAL
  Filled 2021-09-30: qty 1

## 2021-09-30 MED ORDER — SODIUM CHLORIDE 0.9% FLUSH
3.0000 mL | INTRAVENOUS | Status: DC | PRN
Start: 2021-09-30 — End: 2021-10-01

## 2021-09-30 MED ORDER — SODIUM CHLORIDE 0.9 % WEIGHT BASED INFUSION
3.0000 mL/kg/h | INTRAVENOUS | Status: DC
  Administered 2021-09-30: 3 mL/kg/h via INTRAVENOUS

## 2021-09-30 MED ORDER — CLONIDINE HCL 0.1 MG PO TABS: 0.1000 mg | ORAL_TABLET | Freq: Once | ORAL | Status: DC

## 2021-09-30 MED ORDER — HEPARIN (PORCINE) IN NACL 1000-0.9 UT/500ML-% IV SOLN
INTRAVENOUS | Status: DC | PRN
  Administered 2021-09-30 (×2): 500 mL

## 2021-09-30 MED ORDER — CARVEDILOL 12.5 MG PO TABS: 12.5000 mg | ORAL_TABLET | Freq: Once | ORAL | Status: DC

## 2021-09-30 MED ORDER — CLONIDINE HCL 0.1 MG PO TABS
0.1000 mg | ORAL_TABLET | Freq: Two times a day (BID) | ORAL | Status: DC
  Administered 2021-09-30 – 2021-10-01 (×2): 0.1 mg via ORAL
  Filled 2021-09-30 (×2): qty 1

## 2021-09-30 MED ORDER — INSULIN ASPART 100 UNIT/ML IJ SOLN: 0.0000 [IU] | Freq: Every day | INTRAMUSCULAR | Status: DC

## 2021-09-30 MED ORDER — AMLODIPINE BESYLATE 10 MG PO TABS
10.0000 mg | ORAL_TABLET | Freq: Every day | ORAL | Status: DC
  Administered 2021-09-30 – 2021-10-01 (×2): 10 mg via ORAL
  Filled 2021-09-30 (×2): qty 1

## 2021-09-30 MED ORDER — SODIUM CHLORIDE 0.9 % IV SOLN: 250.0000 mL | INTRAVENOUS | Status: DC | PRN

## 2021-09-30 MED ORDER — CARVEDILOL 12.5 MG PO TABS
12.5000 mg | ORAL_TABLET | Freq: Two times a day (BID) | ORAL | Status: DC
  Administered 2021-09-30 – 2021-10-01 (×2): 12.5 mg via ORAL
  Filled 2021-09-30 (×2): qty 1

## 2021-09-30 MED ORDER — IOHEXOL 350 MG/ML SOLN
INTRAVENOUS | Status: DC | PRN
  Administered 2021-09-30: 130 mL

## 2021-09-30 MED ORDER — ACETAMINOPHEN 325 MG PO TABS
650.0000 mg | ORAL_TABLET | ORAL | Status: DC | PRN
  Administered 2021-09-30: 650 mg via ORAL
  Filled 2021-09-30: qty 2

## 2021-09-30 MED ORDER — ASPIRIN EC 81 MG PO TBEC: 81.0000 mg | DELAYED_RELEASE_TABLET | Freq: Every day | ORAL | Status: DC

## 2021-09-30 MED ORDER — SODIUM CHLORIDE 0.9% FLUSH: 3.0000 mL | INTRAVENOUS | Status: DC | PRN

## 2021-09-30 MED ORDER — FAMOTIDINE IN NACL 20-0.9 MG/50ML-% IV SOLN
INTRAVENOUS | Status: DC | PRN
  Administered 2021-09-30: 20 mg via INTRAVENOUS

## 2021-09-30 MED ORDER — FAMOTIDINE IN NACL 20-0.9 MG/50ML-% IV SOLN
INTRAVENOUS | Status: AC
  Filled 2021-09-30: qty 50

## 2021-09-30 MED ORDER — SODIUM CHLORIDE 0.9% FLUSH
3.0000 mL | Freq: Two times a day (BID) | INTRAVENOUS | Status: DC
  Administered 2021-09-30 (×2): 3 mL via INTRAVENOUS

## 2021-09-30 SURGICAL SUPPLY — 21 items
BALL SAPPHIRE NC24 3.25X12 (BALLOONS) ×2
BALLN SAPPHIRE 2.0X12 (BALLOONS) ×2
BALLOON SAPPHIRE 2.0X12 (BALLOONS) IMPLANT
BALLOON SAPPHIRE NC24 3.25X12 (BALLOONS) IMPLANT
CATH 5FR JR4 DIAGNOSTIC (CATHETERS) ×1 IMPLANT
CATH OPTITORQUE TIG 4.0 5F (CATHETERS) ×1 IMPLANT
CATH VISTA GUIDE 6FR XBLAD3.0 (CATHETERS) ×1 IMPLANT
DEVICE RAD COMP TR BAND LRG (VASCULAR PRODUCTS) ×1 IMPLANT
GLIDESHEATH SLEND A-KIT 6F 22G (SHEATH) ×1 IMPLANT
GUIDEWIRE ANGLED .035X150CM (WIRE) ×1 IMPLANT
GUIDEWIRE INQWIRE 1.5J.035X260 (WIRE) IMPLANT
INQWIRE 1.5J .035X260CM (WIRE) ×2
KIT ENCORE 26 ADVANTAGE (KITS) ×1 IMPLANT
KIT HEART LEFT (KITS) ×2 IMPLANT
PACK CARDIAC CATHETERIZATION (CUSTOM PROCEDURE TRAY) ×2 IMPLANT
STENT SYNERGY XD 3.0X16 (Permanent Stent) IMPLANT
SYNERGY XD 3.0X16 (Permanent Stent) ×2 IMPLANT
TRANSDUCER W/STOPCOCK (MISCELLANEOUS) ×2 IMPLANT
TUBING CIL FLEX 10 FLL-RA (TUBING) ×2 IMPLANT
WIRE ASAHI PROWATER 180CM (WIRE) ×1 IMPLANT
WIRE HI TORQ VERSACORE-J 145CM (WIRE) ×1 IMPLANT

## 2021-09-30 NOTE — Plan of Care (Signed)

## 2021-09-30 NOTE — Progress Notes (Signed)
Pt to [place], Room 02, Mickel Baas, Therapist, sports and pts daughter at stretcher in hallway, informed that pt states one breast is larger than other, daughter states pts medical doctor is aware and scheduling mammogram, pt also states she is having pain below left breast area, but when showing RN, pt points to left hip area (anterior), not chest, denies pain in arms or jaws, floor RN aware, pt ambulated to bed with minimal assistance and tolerated well, safety maintained ? ?

## 2021-09-30 NOTE — Progress Notes (Signed)
Spoke with pt's daughter on phone, updated on pt status, no room assigned presently, daughter spoke with pt ?

## 2021-09-30 NOTE — Progress Notes (Signed)
LOCATION: right RADIAL ? ?DEFLATED PER PROTOCOL: YES ? ?TIME BAND OFF/DRESSING APPLIED: 1245, gauze with tegaderm ? ? ?SITE UPON ARRIVAL: LEVEL 0 ? ?SITE AFTER BAND REMOVAL: LEVEL 0 ? ?CIRCULATION SENSATION AND MOVEMENT: +movement, +2 radial pulse ? ?COMMENTS:  ?

## 2021-10-01 ENCOUNTER — Encounter (HOSPITAL_COMMUNITY): Payer: Self-pay | Admitting: Cardiovascular Disease

## 2021-10-01 ENCOUNTER — Other Ambulatory Visit (HOSPITAL_COMMUNITY): Payer: Self-pay

## 2021-10-01 DIAGNOSIS — E1159 Type 2 diabetes mellitus with other circulatory complications: Secondary | ICD-10-CM | POA: Diagnosis not present

## 2021-10-01 DIAGNOSIS — I25118 Atherosclerotic heart disease of native coronary artery with other forms of angina pectoris: Secondary | ICD-10-CM | POA: Diagnosis not present

## 2021-10-01 DIAGNOSIS — E119 Type 2 diabetes mellitus without complications: Secondary | ICD-10-CM | POA: Diagnosis not present

## 2021-10-01 DIAGNOSIS — I10 Essential (primary) hypertension: Secondary | ICD-10-CM | POA: Diagnosis not present

## 2021-10-01 DIAGNOSIS — I2510 Atherosclerotic heart disease of native coronary artery without angina pectoris: Secondary | ICD-10-CM | POA: Diagnosis not present

## 2021-10-01 DIAGNOSIS — E782 Mixed hyperlipidemia: Secondary | ICD-10-CM

## 2021-10-01 DIAGNOSIS — E785 Hyperlipidemia, unspecified: Secondary | ICD-10-CM | POA: Diagnosis not present

## 2021-10-01 LAB — BASIC METABOLIC PANEL
Anion gap: 10 (ref 5–15)
BUN: <5 mg/dL — ABNORMAL LOW (ref 8–23)
CO2: 24 mmol/L (ref 22–32)
Calcium: 9 mg/dL (ref 8.9–10.3)
Chloride: 105 mmol/L (ref 98–111)
Creatinine, Ser: 1.04 mg/dL — ABNORMAL HIGH (ref 0.44–1.00)
GFR, Estimated: 55 mL/min — ABNORMAL LOW (ref >60)
Glucose, Bld: 90 mg/dL (ref 70–99)
Potassium: 3.4 mmol/L — ABNORMAL LOW (ref 3.5–5.1)
Sodium: 139 mmol/L (ref 135–145)

## 2021-10-01 LAB — CBC
HCT: 31.8 % — ABNORMAL LOW (ref 36.0–46.0)
Hemoglobin: 10.4 g/dL — ABNORMAL LOW (ref 12.0–15.0)
MCH: 29.8 pg (ref 26.0–34.0)
MCHC: 32.7 g/dL (ref 30.0–36.0)
MCV: 91.1 fL (ref 80.0–100.0)
Platelets: 266 10*3/uL (ref 150–400)
RBC: 3.49 MIL/uL — ABNORMAL LOW (ref 3.87–5.11)
RDW: 13.6 % (ref 11.5–15.5)
WBC: 6.2 10*3/uL (ref 4.0–10.5)
nRBC: 0 % (ref 0.0–0.2)

## 2021-10-01 LAB — HEMOGLOBIN A1C
Hgb A1c MFr Bld: 5.2 % (ref 4.8–5.6)
Mean Plasma Glucose: 102.54 mg/dL

## 2021-10-01 LAB — GLUCOSE, CAPILLARY: Glucose-Capillary: 101 mg/dL — ABNORMAL HIGH (ref 70–99)

## 2021-10-01 MED ORDER — CLOPIDOGREL BISULFATE 75 MG PO TABS
75.0000 mg | ORAL_TABLET | Freq: Every day | ORAL | 3 refills | Status: DC
Start: 2021-10-02 — End: 2022-11-03
  Filled 2021-10-01: qty 90, 90d supply, fill #0

## 2021-10-01 MED ORDER — POTASSIUM CHLORIDE CRYS ER 20 MEQ PO TBCR
30.0000 meq | EXTENDED_RELEASE_TABLET | Freq: Once | ORAL | Status: AC
  Administered 2021-10-01: 30 meq via ORAL
  Filled 2021-10-01: qty 1

## 2021-10-01 MED ORDER — PANTOPRAZOLE SODIUM 40 MG PO TBEC
40.0000 mg | DELAYED_RELEASE_TABLET | Freq: Every day | ORAL | 1 refills | Status: AC
  Filled 2021-10-01: qty 30, 30d supply, fill #0

## 2021-10-01 MED FILL — Methylprednisolone Sod Succ For Inj 125 MG (Base Equiv): INTRAMUSCULAR | Qty: 2 | Status: AC

## 2021-10-01 MED FILL — Diphenhydramine HCl Inj 50 MG/ML: INTRAMUSCULAR | Qty: 1 | Status: AC

## 2021-10-01 NOTE — Plan of Care (Signed)
DISCHARGE NOTE HOME ?Meredith Phillips to be discharged home per MD order. Discussed prescriptions and follow up appointments with the patient and husband. Prescriptions given to patient; medication list explained in detail. Patient and husband verbalized understanding. ? ?Skin clean, dry and intact without evidence of skin break down, no evidence of skin tears noted. IV catheter discontinued intact. Site without signs and symptoms of complications. Dressing and pressure applied. Pt denies pain at the site currently. No complaints noted. ? ?Patient free of lines, drains, and wounds.  ? ?An After Visit Summary (AVS) was printed and given to the patient. ?Patient escorted via wheelchair, and discharged home via private auto. ? ?Stephan Minister, RN  ?

## 2021-10-01 NOTE — Discharge Summary (Addendum)
?Discharge Summary  ?  ?Patient ID: Meredith Phillips [id number] ?MRN: [id number]; DOB: 1945/10/17 ? ?Admit date: 09/30/2021 ?Discharge date: 10/01/2021 ? ?PCP:  Governor Specking, MD ?  ?[facility] Providers ?Cardiologist:  Quay Burow, MD   { ? ? ?Discharge Diagnoses  ?  ?Principal Problem: ?  CAD (coronary artery disease) ?Active Problems: ?  Benign essential HTN ?  Type 2 diabetes mellitus ([place]) ?  Hyperlipidemia ?  Coronary artery disease hemodynamically significant mid LAD based on coronary CT on February 2023 ? ? ? ?Diagnostic Studies/Procedures  ?  ?Left Heart Cath 09/30/21 ?  Mid LAD lesion is 80% stenosed. ?  A drug-eluting stent was successfully placed using a SYNERGY XD 3.0X16. ?  Post intervention, there is a 0% residual stenosis. ?  The left ventricular systolic function is normal. ?  LV end diastolic pressure is normal. ?  The left ventricular ejection fraction is 55-65% by visual estimate. ?IMPRESSION: Successful mid LAD PCI and drug-eluting stenting of the right radial approach using a 6 Synergy 3 mm x 16 mm long drug-eluting stent postdilated to 3.3 mm.  Patient had normal LV function.  She will need DAPT uninterrupted for at least 6 months.  The sheath was removed and a TR band was placed on the right wrist to achieve patent hemostasis.  The patient left the lab in stable condition.  She will be gently hydrated overnight, discharged home in the morning.  I have communicated the results to the referring cardiologist, Dr. Claudie Leach.  I will see her back in the office in 2 to 3 weeks. ? ?Diagnostic ?Dominance: Right ?Intervention ? ? ?_____________ ?  ?History of Present Illness   ?  ?Meredith Phillips is a 76 y.o. female with a history of hypertension, diabetes, hyperlipidemia.  Per chart review, patient has never had a heart attack or stroke.  She was referred to Dr. Gwenlyn Found by Dr. Claudie Leach for possible cardiac catheterization.  She had a coronary CTA performed on 08/06/2021 that revealed a coronary calcium score of  388 with severe mid LAD stenosis.  She reported have chest pain about every other day.  Planned to undergo outpatient cardiac catheterization on 09/30/2021. ? ?Hospital Course  ?   ?Consultants: None ? ?Coronary Artery Disease ?-Coronary CTA on 08/06/2021 revealed coronary calcium score of 388, severe mid LAD stenosis ?-Because of these findings and because of her ongoing issues with chest pain, patient was referred to Dr. Alvester Chou for outpatient cardiac catheterization ?-Underwent left heart cath on 09/30/2021 with PCI to the mid LAD with DES x1 ?- Patient chest pain free this AM, tolerated walking with cardiac rehab ?-Per cath report, patient will need dual antiplatelet therapy uninterrupted for at least 6 months. Daily ASA and plavix  ?- Continue carvedilol 12.5 mg BID  ?- Continue lipitor 80 mg daily  ?- Continue imdur 60 mg daily  ? ?Essential HTN  ?- Continue amlodipine 10 mg daily, carvedilol 12.5 mg BID, clonidine 0.1 mg BID, telmisartan 80 mg daily, imdur 60 mg daily   ?- Patient's HR did decrease to the upper 40s overnight, however has been maintaining HR in the 60s-70s while awake. No need to decrease carvedilol at this time  ? ?HLD  ?- Continue lipitor 80 mg daily  ? ?Patient did seem somewhat confused this morning, discussed with daughter Montez Morita who confirmed this is her baseline. Patient lives with her husband and grandson who helps around her home. Daughters are both involved with patient and able to  help with her medical care.  ? ?Did the patient have an acute coronary syndrome (MI, NSTEMI, STEMI, etc) this admission?:  No                               ?Did the patient have a percutaneous coronary intervention (stent / angioplasty)?:  Yes.   ? ? ?Cath/PCI Registry Performance & Quality Measures: ?Aspirin prescribed? - Yes ?ADP Receptor Inhibitor (Plavix/Clopidogrel, Brilinta/Ticagrelor or Effient/Prasugrel) prescribed (includes medically managed patients)? - Yes ?High Intensity Statin (Lipitor 40-80mg   or Crestor 20-40mg ) prescribed? - Yes ?For EF <40%, was ACEI/ARB prescribed? - Not Applicable (EF >/= [id number]) ?For EF <40%, Aldosterone Antagonist (Spironolactone or Eplerenone) prescribed? - Not Applicable (EF >/= [id number]) ?Cardiac Rehab Phase II ordered? - Yes  ? ?   ?Patient has a follow up appointment with Dr. Gwenlyn Found on 10/11/2021 ? ? ?  ?Physical Exam ?Constitutional:   ?   General: She is not in acute distress. ?   Appearance: Normal appearance. She is not toxic-appearing.  ?HENT:  ?   Head: Normocephalic and atraumatic.  ?   Nose: Nose normal.  ?Eyes:  ?   Extraocular Movements: Extraocular movements intact.  ?   Conjunctiva/sclera: Conjunctivae normal.  ?Cardiovascular:  ?   Rate and Rhythm: Normal rate and regular rhythm.  ?   Heart sounds: Murmur heard.  ?Systolic murmur is present with a grade of 2/6.  ?   Comments: Right radial cath site stable. No bleeding, swelling, or tenderness. Bilateral radial pulses 2+ bilaterally  ?Pulmonary:  ?   Effort: Pulmonary effort is normal.  ?   Breath sounds: Normal breath sounds.  ?Musculoskeletal:  ?   Cervical back: Neck supple.  ?   Right lower leg: No edema.  ?   Left lower leg: No edema.  ?Skin: ?   General: Skin is warm and dry.  ?  ?_____________ ? ?Discharge Vitals ?Blood pressure (!) 178/72, pulse (!) 52, temperature 97.7 ?F (36.5 ?C), temperature source Oral, resp. rate 16, height 5' (1.524 m), weight 65.6 kg, SpO2 100 %.  ?Filed Weights  ? 09/30/21 [id number] 09/30/21 1726  ?Weight: 68 kg 65.6 kg  ? ? ?Labs & Radiologic Studies  ?  ?CBC ?Recent Labs  ?  10/01/21 ?0412  ?WBC 6.2  ?HGB 10.4*  ?HCT 31.8*  ?MCV 91.1  ?PLT 266  ? ?Basic Metabolic Panel ?Recent Labs  ?  10/01/21 ?0412  ?NA 139  ?K 3.4*  ?CL 105  ?CO2 24  ?GLUCOSE 90  ?BUN <5*  ?CREATININE 1.04*  ?CALCIUM 9.0  ? ?Liver Function Tests ?No results for input(s): AST, ALT, ALKPHOS, BILITOT, PROT, ALBUMIN in the last 72 hours. ?No results for input(s): LIPASE, AMYLASE in the last 72 hours. ?High Sensitivity Troponin:    ?No results for input(s): TROPONINIHS in the last 720 hours.  ?BNP ?Invalid input(s): POCBNP ?D-Dimer ?No results for input(s): DDIMER in the last 72 hours. ?Hemoglobin A1C ?Recent Labs  ?  10/01/21 ?0455  ?HGBA1C 5.2  ? ?Fasting Lipid Panel ?No results for input(s): CHOL, HDL, LDLCALC, TRIG, CHOLHDL, LDLDIRECT in the last 72 hours. ?Thyroid Function Tests ?No results for input(s): TSH, T4TOTAL, T3FREE, THYROIDAB in the last 72 hours. ? ?Invalid input(s): FREET3 ?_____________  ?CARDIAC CATHETERIZATION ? ?Result Date: 09/30/2021 ?Images from the original result were not included.   Mid LAD lesion is 80% stenosed.   A drug-eluting stent was successfully placed using a SYNERGY  XD 3.0X16.   Post intervention, there is a 0% residual stenosis.   The left ventricular systolic function is normal.   LV end diastolic pressure is normal.   The left ventricular ejection fraction is 55-65% by visual estimate. Meredith Phillips is a 76 y.o. female  [id number] LOCATION:  FACILITY: [place] PHYSICIAN: Quay Burow, M.D. 21-Mar-1946 DATE OF PROCEDURE:  09/30/2021 DATE OF DISCHARGE: CARDIAC CATHETERIZATION / PCI DES LAD History obtained from chart review.  Meredith Phillips is a 76 y.o.   thin-appearing married African-American female mother of 68, grandmother of 8 grandchildren who is accompanied by her daughter Ronalee Belts today.  She was referred by Dr. Claudie Leach for cardiac catheterization.  She has a history of treated hypertension, diabetes and hyperlipidemia.  She is never had a heart attack or stroke.  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  She did have a coronary CTA performed 08/06/2021 revealed a coronary calcium score of 388 with a severe mid LAD stenosis.  She was referred for diagnostic coronary angiography.  She does get pain on every other day basis.  She presents today for outpatient radial diagnostic cath. PROCEDURE DESCRIPTION: The patient was brought to the second floor [facility] cath lab in the postabsorptive state.  She was not premedicated.  Her right wrist was prepped and shaved in usual sterile fashion. Xylocaine 1% was used for local anesthesia. A 6 [place] she

## 2021-10-01 NOTE — Progress Notes (Signed)
CARDIAC REHAB PHASE I  ? ?Spoke with pts two daughter and husband via phone with pt present. Reviewed importance of ASA, Plavix, and restrictions. Pt given heart healthy and diabetic diets. Encouraged continued ambulation with emphasis on safety. Pt relays her life story, provided listening ear along with support and encouragement. Will refer to [facility] [place]. ? ?[phone] ?Rufina Falco, RN BSN ?10/01/2021 ?10:02 AM ? ?

## 2021-10-08 ENCOUNTER — Telehealth (HOSPITAL_COMMUNITY): Payer: Self-pay

## 2021-10-08 NOTE — Telephone Encounter (Signed)
TOC OUTPATIENT PHARMACY FOLLOW-UP ?Call #1 attempted for TOC pharmacy follow-up.  Left VM Will follow up in 2-3 days.  ? ?

## 2021-10-10 ENCOUNTER — Other Ambulatory Visit (HOSPITAL_COMMUNITY): Payer: Self-pay

## 2021-10-10 ENCOUNTER — Telehealth (HOSPITAL_COMMUNITY): Payer: Self-pay

## 2021-10-10 NOTE — Telephone Encounter (Signed)
Pharmacy Transitions of Care Follow-up Telephone Call ? ?Date of discharge: 10/01/2021  ?Discharge Diagnosis: CAD ? ?How have you been since you were released from the hospital? Daughter reports she's been okay since she left the hospital, but she still reports some chest pain.   ? ?Medication changes made at discharge: ?START taking: ?clopidogrel (PLAVIX)  ?pantoprazole (Protonix)  ?Icon medications to stop taking   STOP taking: ?esomeprazole 40 MG capsule (NEXIUM)  ?FREESTYLE LITE test strip (glucose blood)  ?meclizine 25 MG tablet (ANTIVERT)  ?metoprolol succinate 25 MG 24 hr tablet (TOPROL-XL)  ? ?Medication changes verified by the patient? Yes ?  ? ?Medication Accessibility: ? ?[facility] [address].  ? ?Was the patient provided with refills on discharged medications? Yes  ? ?Have all prescriptions been transferred from [facility] to home pharmacy? Yes  ? ?Is the patient able to afford medications? Yes ? ? ?Medication Review: ?CLOPIDOGREL (PLAVIX) ?Clopidogrel 75 mg once daily.  ?- Per MD, uninterrupted DAPT for at least 6 months. ?- Reviewed potential DDIs with patient  ?- Advised patient of medications to avoid (NSAIDs, ASA)  ?- Educated that Tylenol (acetaminophen) will be the preferred analgesic to prevent risk of bleeding  ?- Emphasized importance of monitoring for signs and symptoms of bleeding (abnormal bruising, prolonged bleeding, nose bleeds, bleeding from gums, discolored urine, black tarry stools)  ?- Advised patient to alert all providers of anticoagulation therapy prior to starting a new medication or having a procedure  ? ?Follow-up Appointments: ? ?[facility] f/u appt confirmed? Yes Scheduled to see [facility] on 4/28 with Dr. Allyson Sabal. ? ?If their condition worsens, is the pt aware to call PCP or go to the Emergency Dept.? Yes ? ?Final Patient Assessment: ?-Pt is doing well.  ?-Pt daughter verbalized understanding of clopidogrel.  ?-Pt has post discharge appointment and  refill [facility] to [facility]  on [address]. ? ? ?

## 2021-10-11 ENCOUNTER — Encounter: Payer: Self-pay | Admitting: Cardiovascular Disease

## 2021-10-11 ENCOUNTER — Ambulatory Visit: Payer: Medicare Other | Admitting: Cardiovascular Disease

## 2021-10-11 ENCOUNTER — Other Ambulatory Visit (HOSPITAL_COMMUNITY): Payer: Self-pay

## 2021-10-11 VITALS — BP 117/67 | HR 62 | Ht 60.0 in | Wt 147.0 lb

## 2021-10-11 DIAGNOSIS — I2510 Atherosclerotic heart disease of native coronary artery without angina pectoris: Secondary | ICD-10-CM

## 2021-10-11 DIAGNOSIS — I25118 Atherosclerotic heart disease of native coronary artery with other forms of angina pectoris: Secondary | ICD-10-CM

## 2021-10-11 NOTE — Progress Notes (Signed)
? ? ? ?10/11/2021 ?Meredith Phillips   ?01-24-1946  ?[id number] ? ?Primary Physician Governor Specking, MD ?Primary Cardiologist: Lorretta Harp MD [name], [place] ? ?HPI:  Meredith Phillips is a 76 y.o.   thin-appearing married African-American female mother of 92, grandmother of 8 grandchildren who is accompanied by her daughter Ronalee Belts today.  I last saw her in the office 09/11/2021.  She was referred by Dr. Claudie Leach for cardiac catheterization.  She has a history of treated hypertension, diabetes and hyperlipidemia.  She is never had a heart attack or stroke.  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  She did have a coronary CTA performed 08/06/2021 revealed a coronary calcium score of 388 with a severe mid LAD stenosis.  She was referred for diagnostic coronary angiography.  She does get pain on every other day basis. ? ?I performed outpatient radial diagnostic cath on her 09/30/2021 revealing a significant mid LAD stenosis which I stented using a 3 mm x 16 mm long Synergy drug-eluting stent postdilated with a 3.25 mm balloon up to 3.3 mm.  She was discharged home the following day on aspirin and Plavix but unfortunately she has not been taking her aspirin which I have strongly emphasized that she does. ? ? ?Current Meds  ?Medication Sig  ? amLODipine (NORVASC) 10 MG tablet TAKE 1 TABLET BY MOUTH EVERY DAY  ? aspirin EC 81 MG tablet Take 81 mg by mouth daily.  ? atorvastatin (LIPITOR) 80 MG tablet Take 1 tablet (80 mg total) by mouth daily.  ? blood glucose meter kit and supplies Dispense based on patient and insurance preference. Use up to four times daily as directed. (FOR ICD-10 E10.9, E11.9).  ? carvedilol (COREG) 12.5 MG tablet Take 1 tablet (12.5 mg total) by mouth 2 (two) times daily with a meal.  ? Cholecalciferol (VITAMIN D-3) 1000 UNITS CAPS Take 1,000 Units by mouth.  ? cloNIDine (CATAPRES) 0.1 MG tablet Take 0.1 mg by mouth 2 (two) times daily.  ?  clopidogrel (PLAVIX) 75 MG tablet Take 1 tablet (75 mg total) by mouth daily with breakfast.  ? famotidine (PEPCID) 20 MG tablet TAKE 1 TABLET BY MOUTH TWICE A DAY  ? gabapentin (NEURONTIN) 300 MG capsule Take 1 capsule (300 mg total) by mouth 3 (three) times daily. TAKE 1 CAPSULE BY MOUTH THREE TIMES A DAY  ? isosorbide mononitrate (IMDUR) 60 MG 24 hr tablet Take 1 tablet (60 mg total) by mouth daily. (Patient taking differently: Take 30 mg by mouth daily.)  ? JANUVIA 100 MG tablet TAKE 1 TABLET BY MOUTH EVERY DAY (Patient taking differently: Take 100 mg by mouth daily.)  ? Lancets (ONETOUCH ULTRASOFT) lancets USE TO HELP CHECK BLOOD SUGARS TWICE A DAY E11.9 (Patient taking differently: 1 each by Other route as needed for other. Use to help check blood sugars twice a day E11.9)  ? Multiple Vitamins-Minerals (MULTIVITAMIN WITH MINERALS) tablet Take 1 tablet by mouth daily. Centrum woman  ? nitroGLYCERIN (NITROSTAT) 0.4 MG SL tablet Place 0.4 mg under the tongue as needed for chest pain.  ? pantoprazole (PROTONIX) 40 MG tablet Take 1 tablet (40 mg total) by mouth daily.  ? telmisartan (MICARDIS) 80 MG tablet Take 80 mg by mouth daily.  ?  ? ?Allergies  ?Allergen Reactions  ? Codeine Other (See Comments)  ?  Woke up the next morning and did not know where she was  ?  Januvia [Sitagliptin] Itching  ? Other   ?  Doesn't like to take pain medication due to hallucinations.  ? Shellfish Allergy Swelling  ?  Throat and lips  ? ? ?Social History  ? ?Socioeconomic History  ? Marital status: Married  ?  Spouse name: Not on file  ? Number of children: 5  ? Years of education: 53  ? Highest education level: Not on file  ?Occupational History  ? Occupation: Retired  ?Tobacco Use  ? Smoking status: Never  ? Smokeless tobacco: Never  ?Substance and Sexual Activity  ? Alcohol use: No  ? Drug use: No  ? Sexual activity: Not on file  ?Other Topics Concern  ? Not on file  ?Social History Narrative  ? Fun: paint, word search  ? Feels  safe where she is living and no concern for abuse  ? Denies religious beliefs effecting healthcare.   ? ?Social Determinants of Health  ? ?Financial Resource Strain: Not on file  ?Food Insecurity: Not on file  ?Transportation Needs: Not on file  ?Physical Activity: Not on file  ?Stress: Not on file  ?Social Connections: Not on file  ?Intimate Partner Violence: Not on file  ?  ? ?Review of Systems: ?General: negative for chills, fever, night sweats or weight changes.  ?Cardiovascular: negative for chest pain, dyspnea on exertion, edema, orthopnea, palpitations, paroxysmal nocturnal dyspnea or shortness of breath ?Dermatological: negative for rash ?Respiratory: negative for cough or wheezing ?Urologic: negative for hematuria ?Abdominal: negative for nausea, vomiting, diarrhea, bright red blood per rectum, melena, or hematemesis ?Neurologic: negative for visual changes, syncope, or dizziness ?All other systems reviewed and are otherwise negative except as noted above. ? ? ? ?Blood pressure 117/67, pulse 62, height 5' (1.524 m), weight 147 lb (66.7 kg), SpO2 98 %.  ?General appearance: alert and no distress ?Neck: no adenopathy, no carotid bruit, no JVD, supple, symmetrical, trachea midline, and thyroid not enlarged, symmetric, no tenderness/mass/nodules ?Lungs: clear to auscultation bilaterally ?Heart: regular rate and rhythm, S1, S2 normal, no murmur, click, rub or gallop ?Extremities: extremities normal, atraumatic, no cyanosis or edema ?Pulses: 2+ and symmetric ?Skin: Skin color, texture, turgor normal. No rashes or lesions ?Neurologic: Grossly normal ? ?EKG sinus rhythm at 62 with left bundle branch block.  I personally reviewed this EKG. ? ?ASSESSMENT AND PLAN:  ? ?CAD (coronary artery disease) ?Ms. Agresti returns today after her recent outpatient cardiac catheterization which I performed 09/30/2021 after a coronary CTA suggested a mid LAD stenosis.  This was confirmed on angiography.  She had no other significant  CAD.  She had normal LV function.  I successfully implanted a Synergy 3 mm x 16 mm long drug-eluting stent.  She was discharged home the following day.  Aspirin and Plavix were prescribed but unfortunately has not she has not been taking her aspirin. ? ? ? ? ?Lorretta Harp MD FACP,FACC,FAHA, FSCAI ?10/11/2021 ?11:10 AM ?

## 2021-10-11 NOTE — Patient Instructions (Signed)
Medication Instructions:  ?Your Physician recommend you continue on your current medication as directed.   ? ?*If you need a refill on your cardiac medications before your next appointment, please call your pharmacy* ? ? ?Lab Work: ?None ordered today ? ? ?Testing/Procedures: ?None ordered today ? ? ?Follow-Up: ?At [facility], you and your health needs are our priority.  As part of our continuing mission to provide you with exceptional heart care, we have created designated Provider Care Teams.  These Care Teams include your primary Cardiologist (physician) and Advanced Practice Providers (APPs -  Physician Assistants and Nurse Practitioners) who all work together to provide you with the care you need, when you need it. ? ?We recommend signing up for the patient portal called "MyChart".  Sign up information is provided on this After Visit Summary.  MyChart is used to connect with patients for Virtual Visits (Telemedicine).  Patients are able to view lab/test results, encounter notes, upcoming appointments, etc.  Non-urgent messages can be sent to your provider as well.   ?To learn more about what you can do with MyChart, go to [email or web address].   ? ?Your next appointment:   ?As needed ? ?The format for your next appointment:   ?In Person ? ?Provider:   ?Nanetta Batty, MD { ? ?Important Information About Sugar ? ? ? ? ? ? ?

## 2021-10-11 NOTE — Assessment & Plan Note (Signed)
Ms. Meredith Phillips returns today after her recent outpatient cardiac catheterization which I performed 09/30/2021 after a coronary CTA suggested a mid LAD stenosis.  This was confirmed on angiography.  She had no other significant CAD.  She had normal LV function.  I successfully implanted a Synergy 3 mm x 16 mm long drug-eluting stent.  She was discharged home the following day.  Aspirin and Plavix were prescribed but unfortunately has not she has not been taking her aspirin. ?

## 2021-10-25 ENCOUNTER — Other Ambulatory Visit (HOSPITAL_COMMUNITY): Payer: Self-pay

## 2021-11-07 ENCOUNTER — Ambulatory Visit: Payer: Medicare Other | Admitting: Cardiology

## 2021-12-25 ENCOUNTER — Other Ambulatory Visit: Payer: Self-pay | Admitting: Medical

## 2021-12-25 DIAGNOSIS — K2100 Gastro-esophageal reflux disease with esophagitis, without bleeding: Secondary | ICD-10-CM

## 2022-06-19 ENCOUNTER — Emergency Department (HOSPITAL_BASED_OUTPATIENT_CLINIC_OR_DEPARTMENT_OTHER)
Admission: EM | Admit: 2022-06-19 | Discharge: 2022-06-19 | Discharge status: Against Medical Advice | Payer: Medicare Other | Attending: Emergency Medicine | Admitting: Emergency Medicine

## 2022-06-19 ENCOUNTER — Encounter (HOSPITAL_BASED_OUTPATIENT_CLINIC_OR_DEPARTMENT_OTHER): Payer: Self-pay | Admitting: Urology

## 2022-06-19 ENCOUNTER — Other Ambulatory Visit: Payer: Self-pay

## 2022-06-19 DIAGNOSIS — Z5321 Procedure and treatment not carried out due to patient leaving prior to being seen by health care provider: Secondary | ICD-10-CM | POA: Diagnosis not present

## 2022-06-19 DIAGNOSIS — T39395A Adverse effect of other nonsteroidal anti-inflammatory drugs [NSAID], initial encounter: Secondary | ICD-10-CM | POA: Diagnosis not present

## 2022-06-19 DIAGNOSIS — I10 Essential (primary) hypertension: Secondary | ICD-10-CM | POA: Diagnosis not present

## 2022-06-19 LAB — CBC WITH DIFFERENTIAL/PLATELET
Abs Immature Granulocytes: 0.01 10*3/uL (ref 0.00–0.07)
Basophils Absolute: 0 10*3/uL (ref 0.0–0.1)
Basophils Relative: 1 %
Eosinophils Absolute: 0.2 10*3/uL (ref 0.0–0.5)
Eosinophils Relative: 3 %
HCT: 37.1 % (ref 36.0–46.0)
Hemoglobin: 12 g/dL (ref 12.0–15.0)
Immature Granulocytes: 0 %
Lymphocytes Relative: 43 %
Lymphs Abs: 2.8 10*3/uL (ref 0.7–4.0)
MCH: 29.3 pg (ref 26.0–34.0)
MCHC: 32.3 g/dL (ref 30.0–36.0)
MCV: 90.5 fL (ref 80.0–100.0)
Monocytes Absolute: 0.5 10*3/uL (ref 0.1–1.0)
Monocytes Relative: 7 %
Neutro Abs: 3 10*3/uL (ref 1.7–7.7)
Neutrophils Relative %: 46 %
Platelets: 317 10*3/uL (ref 150–400)
RBC: 4.1 MIL/uL (ref 3.87–5.11)
RDW: 13.1 % (ref 11.5–15.5)
WBC: 6.5 10*3/uL (ref 4.0–10.5)
nRBC: 0 % (ref 0.0–0.2)

## 2022-06-19 LAB — COMPREHENSIVE METABOLIC PANEL
ALT: 13 U/L (ref 0–44)
AST: 19 U/L (ref 15–41)
Albumin: 3.6 g/dL (ref 3.5–5.0)
Alkaline Phosphatase: 71 U/L (ref 38–126)
Anion gap: 7 (ref 5–15)
BUN: 5 mg/dL — ABNORMAL LOW (ref 8–23)
CO2: 25 mmol/L (ref 22–32)
Calcium: 9 mg/dL (ref 8.9–10.3)
Chloride: 102 mmol/L (ref 98–111)
Creatinine, Ser: 0.89 mg/dL (ref 0.44–1.00)
GFR, Estimated: >60 mL/min (ref >60)
Glucose, Bld: 90 mg/dL (ref 70–99)
Potassium: 2.9 mmol/L — ABNORMAL LOW (ref 3.5–5.1)
Sodium: 134 mmol/L — ABNORMAL LOW (ref 135–145)
Total Bilirubin: 0.4 mg/dL (ref 0.3–1.2)
Total Protein: 7.5 g/dL (ref 6.5–8.1)

## 2022-06-19 NOTE — ED Triage Notes (Addendum)
Pt danced into triage Per pt and family  Pt took her meloxicam and started having stinging pains in chest, legs, arms, and head  Pt states pain has resolved now   Hypertensive, did not take her BP meds  assymptomatic

## 2022-10-02 ENCOUNTER — Encounter: Payer: Self-pay | Admitting: *Deleted

## 2022-11-03 ENCOUNTER — Other Ambulatory Visit: Payer: Self-pay | Admitting: Cardiology
